# Patient Record
Sex: Female | Born: 1960 | Race: White | Hispanic: No | Marital: Married | Smoking: Former smoker
Health system: Southern US, Community
[De-identification: ages and names within clinical notes are randomized; demographics above are authoritative.]

## PROBLEM LIST (undated history)

## (undated) DIAGNOSIS — C801 Malignant (primary) neoplasm, unspecified: Secondary | ICD-10-CM

## (undated) DIAGNOSIS — I739 Peripheral vascular disease, unspecified: Secondary | ICD-10-CM

## (undated) DIAGNOSIS — M1990 Unspecified osteoarthritis, unspecified site: Secondary | ICD-10-CM

## (undated) DIAGNOSIS — K219 Gastro-esophageal reflux disease without esophagitis: Secondary | ICD-10-CM

## (undated) DIAGNOSIS — Z720 Tobacco use: Secondary | ICD-10-CM

## (undated) DIAGNOSIS — E785 Hyperlipidemia, unspecified: Secondary | ICD-10-CM

## (undated) DIAGNOSIS — E538 Deficiency of other specified B group vitamins: Secondary | ICD-10-CM

## (undated) DIAGNOSIS — F419 Anxiety disorder, unspecified: Secondary | ICD-10-CM

## (undated) DIAGNOSIS — J302 Other seasonal allergic rhinitis: Secondary | ICD-10-CM

## (undated) DIAGNOSIS — R0609 Other forms of dyspnea: Secondary | ICD-10-CM

## (undated) DIAGNOSIS — D7589 Other specified diseases of blood and blood-forming organs: Secondary | ICD-10-CM

## (undated) DIAGNOSIS — G8929 Other chronic pain: Secondary | ICD-10-CM

## (undated) DIAGNOSIS — M542 Cervicalgia: Secondary | ICD-10-CM

## (undated) DIAGNOSIS — R0600 Dyspnea, unspecified: Secondary | ICD-10-CM

## (undated) DIAGNOSIS — J449 Chronic obstructive pulmonary disease, unspecified: Secondary | ICD-10-CM

## (undated) DIAGNOSIS — I10 Essential (primary) hypertension: Secondary | ICD-10-CM

## (undated) DIAGNOSIS — I701 Atherosclerosis of renal artery: Secondary | ICD-10-CM

## (undated) DIAGNOSIS — N289 Disorder of kidney and ureter, unspecified: Secondary | ICD-10-CM

## (undated) HISTORY — DX: Tobacco use: Z72.0

## (undated) HISTORY — PX: APPENDECTOMY: SHX54

## (undated) HISTORY — DX: Atherosclerosis of renal artery: I70.1

## (undated) HISTORY — PX: ANTERIOR CERVICAL DECOMP/DISCECTOMY FUSION: SHX1161

## (undated) HISTORY — DX: Deficiency of other specified B group vitamins: E53.8

## (undated) HISTORY — DX: Other specified diseases of blood and blood-forming organs: D75.89

---

## 1988-07-19 HISTORY — PX: TUBAL LIGATION: SHX77

## 2006-04-13 ENCOUNTER — Ambulatory Visit: Payer: Self-pay | Admitting: Pediatrics

## 2007-03-03 ENCOUNTER — Ambulatory Visit (HOSPITAL_COMMUNITY): Admission: RE | Admit: 2007-03-03 | Discharge: 2007-03-03 | Payer: Self-pay | Admitting: Family Medicine

## 2007-03-24 ENCOUNTER — Ambulatory Visit (HOSPITAL_COMMUNITY): Admission: RE | Admit: 2007-03-24 | Discharge: 2007-03-24 | Payer: Self-pay | Admitting: Family Medicine

## 2007-05-03 ENCOUNTER — Ambulatory Visit: Payer: Self-pay | Admitting: Thoracic Surgery

## 2007-05-09 ENCOUNTER — Encounter: Payer: Self-pay | Admitting: Thoracic Surgery

## 2007-05-09 ENCOUNTER — Inpatient Hospital Stay (HOSPITAL_COMMUNITY): Admission: RE | Admit: 2007-05-09 | Discharge: 2007-05-11 | Payer: Self-pay | Admitting: Neurosurgery

## 2007-05-09 ENCOUNTER — Ambulatory Visit: Payer: Self-pay | Admitting: Thoracic Surgery

## 2010-09-30 ENCOUNTER — Other Ambulatory Visit (HOSPITAL_COMMUNITY): Payer: Self-pay | Admitting: Family Medicine

## 2010-09-30 DIAGNOSIS — Z139 Encounter for screening, unspecified: Secondary | ICD-10-CM

## 2010-10-05 ENCOUNTER — Ambulatory Visit (HOSPITAL_COMMUNITY)
Admission: RE | Admit: 2010-10-05 | Discharge: 2010-10-05 | Disposition: A | Discharge status: Home / Self Care | Payer: Managed Care, Other (non HMO) | Source: Ambulatory Visit | Attending: Family Medicine | Admitting: Family Medicine

## 2010-10-05 ENCOUNTER — Ambulatory Visit (HOSPITAL_COMMUNITY)
Admission: RE | Admit: 2010-10-05 | Discharge: 2010-10-05 | Disposition: A | Discharge status: Home / Self Care | Payer: Managed Care, Other (non HMO) | Source: Ambulatory Visit | Attending: Neurosurgery | Admitting: Neurosurgery

## 2010-10-05 DIAGNOSIS — M542 Cervicalgia: Secondary | ICD-10-CM | POA: Insufficient documentation

## 2010-10-05 DIAGNOSIS — Z139 Encounter for screening, unspecified: Secondary | ICD-10-CM

## 2010-10-05 DIAGNOSIS — Z1231 Encounter for screening mammogram for malignant neoplasm of breast: Secondary | ICD-10-CM | POA: Insufficient documentation

## 2010-10-05 DIAGNOSIS — IMO0001 Reserved for inherently not codable concepts without codable children: Secondary | ICD-10-CM | POA: Insufficient documentation

## 2010-10-05 DIAGNOSIS — M6281 Muscle weakness (generalized): Secondary | ICD-10-CM | POA: Insufficient documentation

## 2010-10-08 ENCOUNTER — Ambulatory Visit (HOSPITAL_COMMUNITY)
Admission: RE | Admit: 2010-10-08 | Discharge: 2010-10-08 | Disposition: A | Discharge status: Home / Self Care | Payer: Managed Care, Other (non HMO) | Source: Ambulatory Visit | Attending: *Deleted | Admitting: *Deleted

## 2010-10-13 ENCOUNTER — Ambulatory Visit (HOSPITAL_COMMUNITY)
Admission: RE | Admit: 2010-10-13 | Discharge: 2010-10-13 | Disposition: A | Discharge status: Home / Self Care | Payer: Managed Care, Other (non HMO) | Source: Ambulatory Visit | Attending: *Deleted | Admitting: *Deleted

## 2010-10-15 ENCOUNTER — Ambulatory Visit (HOSPITAL_COMMUNITY)
Admission: RE | Admit: 2010-10-15 | Discharge: 2010-10-15 | Disposition: A | Discharge status: Home / Self Care | Payer: Managed Care, Other (non HMO) | Source: Ambulatory Visit | Attending: *Deleted | Admitting: *Deleted

## 2010-10-20 ENCOUNTER — Ambulatory Visit (HOSPITAL_COMMUNITY): Payer: Managed Care, Other (non HMO)

## 2010-10-22 ENCOUNTER — Ambulatory Visit (HOSPITAL_COMMUNITY): Payer: Managed Care, Other (non HMO) | Admitting: *Deleted

## 2010-10-27 ENCOUNTER — Ambulatory Visit (HOSPITAL_COMMUNITY): Payer: Managed Care, Other (non HMO) | Admitting: *Deleted

## 2010-10-29 ENCOUNTER — Ambulatory Visit (HOSPITAL_COMMUNITY): Payer: Managed Care, Other (non HMO) | Admitting: Physical Therapy

## 2010-12-01 NOTE — Letter (Signed)
May 03, 2007   Connie Farrell. Venetia Maxon. MD  [address], [address]  [place], [place]  [address]   Re:  ZSAZSA, BAHENA                 DOB:  01-27-1961   Dr. Scherry Ran,   I saw the patient regarding her surgery on October 21.  This 50 year old  Caucasian female has had a long history of arm and back pain and  underwent an MRI of her neck and thoracic spine, which showed multi  level cervical and upper thoracic disk disease as well as osteophytes  and she is currently scheduled for a T1-T2 diskectomy on the 21st.  She  came here to discuss the surgery, as far as exposure.   PAST MEDICAL HISTORY:  She is on hydrocodone for pain and takes  tizanidine/hydrochlorothiazide 2 mg 4 times a day and has no allergies.   FAMILY HISTORY:  Noncontributory.   SOCIAL HISTORY:  She is married and has 2 children.  She works as a  Hydrographic surveyor.  She smokes a pack of cigarette per day, does not  drink alcohol on a regular basis.   REVIEW OF SYSTEMS:  She has had some weight loss, she is 142 pounds; she  is 5 feet 4 inches.  CARDIAC: No angina of atrial fibrillation.  PULMONARY:  She has asthma and wheezing.  GI:  No hiatal hernia, GERD, nausea, vomiting or constipation.  GU:  No kidney disease, dysuria or frequent urination.  VASCULAR:  No claudication, DVT, TIAs.  NEUROLOGICAL:  See history and present illness.  No seizures or  blackouts.  MUSCULOSKELETAL:  She has arthritis and joint pain.   No psychiatric illnesses, no changes in eyesight or hearing and no  problems with bleeding or clotting disorders.   PHYSICAL EXAMINATION:  She is a thin Caucasian female, in no acute  distress. Her blood pressure is 132/90, pulse 85, respirations 18,  saturation is 98%.  Head, Eyes, Ears, Nose And Throat:  Unremarkable.  Neck:  Supple without thyromegaly.  Chest:  Clear to auscultation and  percussion.  Heart:  Regular sinus rhythm, no murmurs.  Abdomen:  Soft.  There is no hepatosplenomegaly.  Pulses  are 2+.  No clubbing or edema.  Neurologic was intact.   I discussed the risk of the procedure, including possible laryngeal  nerve injury, bleeding and infection.  She agrees to the surgery and I  plan to do this next week.  I appreciate the opportunity in seeing the  patient.   Ines Bloomer, M.D.  Electronically Signed   DPB/MEDQ  D:  05/03/2007  T:  05/04/2007  Job:  [id number]

## 2010-12-01 NOTE — Op Note (Signed)
NAMEFERRIS, TALLY                 ACCOUNT NO.:  [id number]   MEDICAL RECORD NO.:  [id number]          PATIENT TYPE:  INP   LOCATION:  3316                         FACILITY:  [facility]   PHYSICIAN:  Danae Orleans. Venetia Maxon, M.D.  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   PROCEDURE:  1. Exposure to T2 performed by Dr. Edwyna Shell,.  2. Anterior cervical/thoracic discectomy and fusion C7-T1 and T1-T2      levels with allograft bone graft, submental autograft, and anterior      cervical plate C7 through T2 levels.   SURGEON:  Danae Orleans. Venetia Maxon, M.D.   ASSISTANT:  Dalia Heading, M.D.  Georgiann Cocker, RN   ANESTHESIA:  General endotracheal anesthesia.   BLOOD LOSS:  Minimal.   COMPLICATIONS:  None.   DISPOSITION:  Recovery.   INDICATIONS:  Connie Farrell is a 50 year old woman with severe right arm  pain and weakness with a right sided disc herniation at C7-T1 and  bilobed disc herniation with cord compression and bilateral T1 nerve  root compression at T1-T2.  It was elected to perform anterior  cervical/thoracic decompression and fusion at these affected levels.  The patient was counseled that she would possibly need a sternotomy to  expose this area.  She was also counseled that she may have hoarseness  after the surgery.   PROCEDURE IN DETAIL:  [name] was brought to the operating room.  She  was placed in a supine position in slight extension. After intubation  with general endotracheal anesthesia, her anterior neck and upper chest  were then prepped and draped  in the usual sterile fashion.  She had been  placed in 10 pounds of halter traction.  Dr. Edwyna Shell performed the  exposure and will dictate this separately.  After he had exposed the  anterior cervical spine, I placed a bent spinal needle at what was felt  to be the C7-T1 and C6-C7 levels and these were confirmed on  intraoperative x-ray.  Subsequently, further exposure was performed of  the anterior cervical thoracic spine.  The longus colli muscles were  taken down and the interspaces were then incised. Self-retaining  Shadowline retractor was placed to facilitate exposure.  The recurrent  laryngeal nerve had been identified and was carefully avoided throughout  the surgical procedure.   The discs were removed at C7-T1 and T1-T2 levels initially with a 15  blade and then a variety of Carlen curettes and pituitary rongeurs.  The  distraction pins were initially placed at T1 and T2 and the microscope  was brought into the field. Using high power microscopic visualization,  initially the endplates were decorticated with a high speed drill and  uncinate spurs were drilled down.  The spinal cord dura and both C1 nerve  roots were then decompressed as they extended out the neural foramina  and the posterior longitudinal ligament was removed.  Hemostasis was  assured and after trial sizing, a 7 mm allograft of bone wedge was  selected, deep ridges were thinned, and morcellized bone autograft,  which had been preserved at the time of drilling the endplates, was  mixed with demineralized bone matrix, packed within the spacer, inserted  in the interspace, and countersunk appropriately.  The distraction pin  was then moved from T2 to the C7 level and a similar discectomy was  performed. At this level, there was a significant amount of herniated  disc material and off on the right side which was compressing the right  C8 nerve root.  This was widely decompressed as was the spinal cord dura  and left  C8 nerve root.  Hemostasis was again assured and a similarly  sized bone graft was selected, fashioned, packed with morcellized bone  autograft and demineralized bone matrix, inserted in the interspace, and  countersunk appropriately.   The anterior cervical plate was then affixed to the anterior cervical  spine using tressel plating system.  14 mm variable angle screws were  placed, two at C7, two at T1, and two at T2. All screws had excellent  purchase.  Their locking mechanisms were engaged.  Final x-ray  demonstrated well positioned upper aspect of the plate at C7, it was not  possible to visualize the remainder of the construct because of the  level at which we were operating.  A small Blake drain was then placed  through a separate stab incision.  The soft tissues were inspected and  found to be in good repair.  Hemostasis assured.  The strap muscles were  reapproximated with 2-0 Vicryl sutures, the platysma layer was  reapproximated with 2-0 Vicryl interrupted inverted sutures, the skin  edges were approximated with interrupted 3-0 Vicryl subcuticular stitch.  The wound was dressed with Benzoin, Steri-Strips, Telfa gauze and tape.  The patient was extubated in the operating room and taken to the  recovery room in stable, satisfactory condition, having tolerated the  operation well.  Counts were correct at the end of the case.      Danae Orleans. Venetia Maxon, M.D.  Electronically Signed     JDS/MEDQ  D:  05/09/2007  T:  05/09/2007  Job:  [id number]

## 2011-04-28 LAB — BLOOD GAS, ARTERIAL
Acid-base deficit: 4.7 — ABNORMAL HIGH
Bicarbonate: 19.2 — ABNORMAL LOW
Patient temperature: 98.6
TCO2: 20.1

## 2011-04-28 LAB — BASIC METABOLIC PANEL
Chloride: 109
Creatinine, Ser: 0.77
GFR calc Af Amer: >60
Glucose, Bld: 103 — ABNORMAL HIGH
Potassium: 4.2
Sodium: 136

## 2011-04-28 LAB — URINALYSIS, ROUTINE W REFLEX MICROSCOPIC
Bilirubin Urine: NEGATIVE
Urobilinogen, UA: 1

## 2011-04-28 LAB — TYPE AND SCREEN: ABO/RH(D): A POS

## 2011-04-28 LAB — ABO/RH: ABO/RH(D): A POS

## 2011-04-28 LAB — CBC
HCT: 44.5
MCHC: 34
MCV: 95
Platelets: 266
RDW: 14

## 2011-04-28 LAB — PROTIME-INR
INR: 1
Prothrombin Time: 13.3

## 2011-04-28 LAB — URINE MICROSCOPIC-ADD ON

## 2011-04-28 LAB — APTT: aPTT: 30

## 2011-05-28 ENCOUNTER — Emergency Department (HOSPITAL_COMMUNITY): Payer: No Typology Code available for payment source

## 2011-05-28 ENCOUNTER — Encounter: Payer: Self-pay | Admitting: Emergency Medicine

## 2011-05-28 ENCOUNTER — Emergency Department (HOSPITAL_COMMUNITY)
Admission: EM | Admit: 2011-05-28 | Discharge: 2011-05-28 | Disposition: A | Discharge status: Home / Self Care | Payer: No Typology Code available for payment source | Attending: Emergency Medicine | Admitting: Emergency Medicine

## 2011-05-28 ENCOUNTER — Emergency Department (HOSPITAL_COMMUNITY): Payer: Managed Care, Other (non HMO)

## 2011-05-28 DIAGNOSIS — Y92410 Unspecified street and highway as the place of occurrence of the external cause: Secondary | ICD-10-CM | POA: Insufficient documentation

## 2011-05-28 DIAGNOSIS — E785 Hyperlipidemia, unspecified: Secondary | ICD-10-CM | POA: Insufficient documentation

## 2011-05-28 DIAGNOSIS — S060X9A Concussion with loss of consciousness of unspecified duration, initial encounter: Secondary | ICD-10-CM | POA: Insufficient documentation

## 2011-05-28 DIAGNOSIS — S060XAA Concussion with loss of consciousness status unknown, initial encounter: Secondary | ICD-10-CM | POA: Insufficient documentation

## 2011-05-28 DIAGNOSIS — S8253XA Displaced fracture of medial malleolus of unspecified tibia, initial encounter for closed fracture: Secondary | ICD-10-CM | POA: Insufficient documentation

## 2011-05-28 DIAGNOSIS — S139XXA Sprain of joints and ligaments of unspecified parts of neck, initial encounter: Secondary | ICD-10-CM | POA: Insufficient documentation

## 2011-05-28 DIAGNOSIS — S20219A Contusion of unspecified front wall of thorax, initial encounter: Secondary | ICD-10-CM | POA: Insufficient documentation

## 2011-05-28 DIAGNOSIS — M542 Cervicalgia: Secondary | ICD-10-CM | POA: Insufficient documentation

## 2011-05-28 DIAGNOSIS — G8929 Other chronic pain: Secondary | ICD-10-CM | POA: Insufficient documentation

## 2011-05-28 DIAGNOSIS — S82892A Other fracture of left lower leg, initial encounter for closed fracture: Secondary | ICD-10-CM

## 2011-05-28 DIAGNOSIS — S161XXA Strain of muscle, fascia and tendon at neck level, initial encounter: Secondary | ICD-10-CM

## 2011-05-28 DIAGNOSIS — I10 Essential (primary) hypertension: Secondary | ICD-10-CM | POA: Insufficient documentation

## 2011-05-28 HISTORY — DX: Hyperlipidemia, unspecified: E78.5

## 2011-05-28 HISTORY — DX: Cervicalgia: M54.2

## 2011-05-28 HISTORY — DX: Other seasonal allergic rhinitis: J30.2

## 2011-05-28 HISTORY — DX: Other chronic pain: G89.29

## 2011-05-28 HISTORY — DX: Essential (primary) hypertension: I10

## 2011-05-28 MED ORDER — METOCLOPRAMIDE HCL 5 MG/ML IJ SOLN
10.0000 mg | Freq: Once | INTRAMUSCULAR | Status: AC
  Administered 2011-05-28: 10 mg via INTRAMUSCULAR
  Filled 2011-05-28: qty 2

## 2011-05-28 MED ORDER — HYDROMORPHONE HCL PF 2 MG/ML IJ SOLN
2.0000 mg | Freq: Once | INTRAMUSCULAR | Status: AC
  Administered 2011-05-28: 2 mg via INTRAMUSCULAR
  Filled 2011-05-28: qty 1

## 2011-05-28 MED ORDER — METOCLOPRAMIDE HCL 10 MG PO TABS: 10.0000 mg | ORAL_TABLET | Freq: Four times a day (QID) | ORAL | Status: AC | PRN

## 2011-05-28 MED ORDER — OXYCODONE-ACETAMINOPHEN 5-325 MG PO TABS: 2.0000 | ORAL_TABLET | ORAL | Status: AC | PRN

## 2011-05-28 MED ORDER — OXYCODONE-ACETAMINOPHEN 5-325 MG PO TABS: 2.0000 | ORAL_TABLET | Freq: Once | ORAL | Status: DC
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
## 2011-05-28 NOTE — ED Notes (Signed)
Patient and family made aware of CT results, verbalized understanding. C-collar removed and gingerale given per request and EDP's approval.
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
## 2011-06-07 ENCOUNTER — Encounter: Payer: Self-pay | Admitting: Orthopedic Surgery

## 2011-06-07 ENCOUNTER — Ambulatory Visit (INDEPENDENT_AMBULATORY_CARE_PROVIDER_SITE_OTHER): Payer: Managed Care, Other (non HMO) | Admitting: Orthopedic Surgery

## 2011-06-07 VITALS — Ht 60.0 in | Wt 147.0 lb

## 2011-06-07 DIAGNOSIS — S82899A Other fracture of unspecified lower leg, initial encounter for closed fracture: Secondary | ICD-10-CM

## 2011-06-07 MED ORDER — OXYCODONE-ACETAMINOPHEN 7.5-325 MG PO TABS: 1.0000 | ORAL_TABLET | ORAL | Status: DC | PRN

## 2011-06-07 NOTE — Patient Instructions (Addendum)
You are allowed to weight bear as tolerated   She'll be out of work for a period of probably 6 weeks

## 2011-06-08 ENCOUNTER — Encounter: Payer: Self-pay | Admitting: Orthopedic Surgery
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
## 2011-06-15 ENCOUNTER — Telehealth: Payer: Self-pay | Admitting: Orthopedic Surgery

## 2011-06-15 NOTE — Telephone Encounter (Signed)
Advised to cancel script from Dr Romeo Apple per Dr Romeo Apple

## 2011-06-15 NOTE — Telephone Encounter (Signed)
Please call Marchelle Folks at [facility] about a prescription written for [facility]. Her # [phone]

## 2011-06-29 ENCOUNTER — Encounter: Payer: Self-pay | Admitting: Orthopedic Surgery

## 2011-06-29 ENCOUNTER — Ambulatory Visit (INDEPENDENT_AMBULATORY_CARE_PROVIDER_SITE_OTHER): Payer: Managed Care, Other (non HMO) | Admitting: Orthopedic Surgery

## 2011-06-29 VITALS — BP 124/80 | Ht 60.0 in | Wt 147.0 lb

## 2011-06-29 DIAGNOSIS — S82899A Other fracture of unspecified lower leg, initial encounter for closed fracture: Secondary | ICD-10-CM

## 2011-06-29 MED ORDER — OXYCODONE-ACETAMINOPHEN 5-325 MG PO TABS: 1.0000 | ORAL_TABLET | Freq: Four times a day (QID) | ORAL | Status: AC | PRN

## 2011-06-29 NOTE — Patient Instructions (Signed)
Brace weight bear as tolerated  
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
## 2011-08-03 ENCOUNTER — Encounter: Payer: Self-pay | Admitting: Orthopedic Surgery

## 2011-08-03 ENCOUNTER — Ambulatory Visit (INDEPENDENT_AMBULATORY_CARE_PROVIDER_SITE_OTHER): Payer: Managed Care, Other (non HMO) | Admitting: Orthopedic Surgery

## 2011-08-03 VITALS — Ht 60.0 in | Wt 147.0 lb

## 2011-08-03 DIAGNOSIS — S82899A Other fracture of unspecified lower leg, initial encounter for closed fracture: Secondary | ICD-10-CM

## 2011-08-03 MED ORDER — OXYCODONE-ACETAMINOPHEN 7.5-325 MG PO TABS: 1.0000 | ORAL_TABLET | ORAL | Status: DC | PRN

## 2011-08-03 NOTE — Patient Instructions (Signed)
Remove cam walker   Gradual return to normal activity

## 2011-08-11 ENCOUNTER — Encounter: Payer: Self-pay | Admitting: Orthopedic Surgery
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
## 2011-11-29 ENCOUNTER — Other Ambulatory Visit (HOSPITAL_COMMUNITY): Payer: Self-pay | Admitting: Internal Medicine

## 2011-11-29 DIAGNOSIS — M25569 Pain in unspecified knee: Secondary | ICD-10-CM

## 2011-11-29 DIAGNOSIS — R7989 Other specified abnormal findings of blood chemistry: Secondary | ICD-10-CM

## 2011-12-02 ENCOUNTER — Encounter (HOSPITAL_COMMUNITY): Payer: Self-pay

## 2011-12-02 ENCOUNTER — Ambulatory Visit (HOSPITAL_COMMUNITY)
Admission: RE | Admit: 2011-12-02 | Discharge: 2011-12-02 | Disposition: A | Discharge status: Home / Self Care | Payer: Managed Care, Other (non HMO) | Source: Ambulatory Visit | Attending: Internal Medicine | Admitting: Internal Medicine

## 2011-12-02 DIAGNOSIS — S2220XA Unspecified fracture of sternum, initial encounter for closed fracture: Secondary | ICD-10-CM | POA: Insufficient documentation

## 2011-12-02 DIAGNOSIS — R791 Abnormal coagulation profile: Secondary | ICD-10-CM | POA: Insufficient documentation

## 2011-12-02 DIAGNOSIS — R0602 Shortness of breath: Secondary | ICD-10-CM | POA: Insufficient documentation

## 2011-12-02 DIAGNOSIS — R7989 Other specified abnormal findings of blood chemistry: Secondary | ICD-10-CM

## 2011-12-02 DIAGNOSIS — R42 Dizziness and giddiness: Secondary | ICD-10-CM | POA: Insufficient documentation

## 2011-12-02 DIAGNOSIS — M25569 Pain in unspecified knee: Secondary | ICD-10-CM

## 2011-12-02 DIAGNOSIS — X58XXXA Exposure to other specified factors, initial encounter: Secondary | ICD-10-CM | POA: Insufficient documentation

## 2011-12-02 MED ORDER — IOHEXOL 350 MG/ML SOLN
100.0000 mL | Freq: Once | INTRAVENOUS | Status: AC | PRN
  Administered 2011-12-02: 100 mL via INTRAVENOUS

## 2012-02-21 ENCOUNTER — Other Ambulatory Visit (HOSPITAL_COMMUNITY): Payer: Self-pay | Admitting: Internal Medicine

## 2012-02-21 DIAGNOSIS — I739 Peripheral vascular disease, unspecified: Secondary | ICD-10-CM

## 2012-02-24 ENCOUNTER — Ambulatory Visit (HOSPITAL_COMMUNITY)
Admission: RE | Admit: 2012-02-24 | Discharge: 2012-02-24 | Disposition: A | Discharge status: Home / Self Care | Payer: Managed Care, Other (non HMO) | Source: Ambulatory Visit | Attending: Internal Medicine | Admitting: Internal Medicine

## 2012-02-24 DIAGNOSIS — I10 Essential (primary) hypertension: Secondary | ICD-10-CM | POA: Insufficient documentation

## 2012-02-24 DIAGNOSIS — I739 Peripheral vascular disease, unspecified: Secondary | ICD-10-CM

## 2012-02-24 DIAGNOSIS — F17200 Nicotine dependence, unspecified, uncomplicated: Secondary | ICD-10-CM | POA: Insufficient documentation

## 2012-03-22 ENCOUNTER — Encounter (HOSPITAL_COMMUNITY): Payer: Self-pay | Admitting: Pharmacy Technician

## 2012-04-03 ENCOUNTER — Other Ambulatory Visit: Payer: Self-pay | Admitting: Cardiovascular Disease

## 2012-04-04 ENCOUNTER — Encounter (HOSPITAL_COMMUNITY): Payer: Self-pay | Admitting: General Practice

## 2012-04-04 ENCOUNTER — Ambulatory Visit (HOSPITAL_COMMUNITY)
Admission: RE | Admit: 2012-04-04 | Discharge: 2012-04-05 | Disposition: A | Discharge status: Home / Self Care | Payer: Managed Care, Other (non HMO) | Source: Ambulatory Visit | Attending: Cardiovascular Disease | Admitting: Cardiovascular Disease

## 2012-04-04 ENCOUNTER — Encounter (HOSPITAL_COMMUNITY)
Admission: RE | Disposition: A | Discharge status: Home / Self Care | Payer: Self-pay | Source: Ambulatory Visit | Attending: Cardiovascular Disease

## 2012-04-04 DIAGNOSIS — I70219 Atherosclerosis of native arteries of extremities with intermittent claudication, unspecified extremity: Secondary | ICD-10-CM | POA: Insufficient documentation

## 2012-04-04 DIAGNOSIS — I10 Essential (primary) hypertension: Secondary | ICD-10-CM

## 2012-04-04 DIAGNOSIS — Z720 Tobacco use: Secondary | ICD-10-CM | POA: Diagnosis present

## 2012-04-04 DIAGNOSIS — I739 Peripheral vascular disease, unspecified: Secondary | ICD-10-CM

## 2012-04-04 DIAGNOSIS — E785 Hyperlipidemia, unspecified: Secondary | ICD-10-CM | POA: Diagnosis present

## 2012-04-04 HISTORY — PX: LOWER EXTREMITY ANGIOGRAM: SHX5508

## 2012-04-04 HISTORY — DX: Unspecified osteoarthritis, unspecified site: M19.90

## 2012-04-04 HISTORY — DX: Dyspnea, unspecified: R06.00

## 2012-04-04 HISTORY — PX: PERIPHERAL ARTERIAL STENT GRAFT: SHX2220

## 2012-04-04 HISTORY — PX: ABDOMINAL ANGIOGRAM: SHX5499

## 2012-04-04 HISTORY — DX: Peripheral vascular disease, unspecified: I73.9

## 2012-04-04 HISTORY — DX: Anxiety disorder, unspecified: F41.9

## 2012-04-04 HISTORY — DX: Gastro-esophageal reflux disease without esophagitis: K21.9

## 2012-04-04 HISTORY — DX: Other forms of dyspnea: R06.09

## 2012-04-04 LAB — POCT ACTIVATED CLOTTING TIME
Activated Clotting Time: 244 seconds
Activated Clotting Time: 194 seconds

## 2012-04-04 SURGERY — ANGIOGRAM, LOWER EXTREMITY
Anesthesia: LOCAL

## 2012-04-04 MED ORDER — SODIUM CHLORIDE 0.9 % IJ SOLN: 3.0000 mL | Freq: Two times a day (BID) | INTRAMUSCULAR | Status: DC

## 2012-04-04 MED ORDER — SODIUM CHLORIDE 0.9 % IV SOLN
INTRAVENOUS | Status: AC
  Administered 2012-04-04: 13:00:00 via INTRAVENOUS

## 2012-04-04 MED ORDER — MORPHINE SULFATE 2 MG/ML IJ SOLN: 1.0000 mg | INTRAMUSCULAR | Status: DC | PRN

## 2012-04-04 MED ORDER — ASPIRIN EC 81 MG PO TBEC
81.0000 mg | DELAYED_RELEASE_TABLET | Freq: Every day | ORAL | Status: DC
  Administered 2012-04-05: 10:00:00 81 mg via ORAL
  Filled 2012-04-04 (×2): qty 1

## 2012-04-04 MED ORDER — HYDRALAZINE HCL 20 MG/ML IJ SOLN: 10.0000 mg | INTRAMUSCULAR | Status: DC

## 2012-04-04 MED ORDER — LISINOPRIL 20 MG PO TABS
20.0000 mg | ORAL_TABLET | Freq: Every day | ORAL | Status: DC
  Administered 2012-04-04 – 2012-04-05 (×2): 20 mg via ORAL
  Filled 2012-04-04 (×2): qty 1

## 2012-04-04 MED ORDER — CLOPIDOGREL BISULFATE 300 MG PO TABS
ORAL_TABLET | ORAL | Status: AC
  Filled 2012-04-04: qty 1

## 2012-04-04 MED ORDER — LIDOCAINE HCL (PF) 1 % IJ SOLN
INTRAMUSCULAR | Status: AC
  Filled 2012-04-04: qty 30

## 2012-04-04 MED ORDER — ATORVASTATIN CALCIUM 40 MG PO TABS
40.0000 mg | ORAL_TABLET | Freq: Every day | ORAL | Status: DC
  Administered 2012-04-04: 40 mg via ORAL
  Filled 2012-04-04 (×2): qty 1

## 2012-04-04 MED ORDER — HEPARIN SODIUM (PORCINE) 1000 UNIT/ML IJ SOLN
INTRAMUSCULAR | Status: AC
  Filled 2012-04-04: qty 1

## 2012-04-04 MED ORDER — ASPIRIN 81 MG PO CHEW
CHEWABLE_TABLET | ORAL | Status: AC
  Administered 2012-04-04: 324 mg via ORAL
  Filled 2012-04-04: qty 4

## 2012-04-04 MED ORDER — SODIUM CHLORIDE 0.9 % IV SOLN: 250.0000 mL | INTRAVENOUS | Status: DC | PRN

## 2012-04-04 MED ORDER — ONDANSETRON HCL 4 MG/2ML IJ SOLN: 4.0000 mg | Freq: Four times a day (QID) | INTRAMUSCULAR | Status: DC | PRN

## 2012-04-04 MED ORDER — HYDROCODONE-ACETAMINOPHEN 5-325 MG PO TABS
1.0000 | ORAL_TABLET | ORAL | Status: DC | PRN
  Administered 2012-04-04: 1 via ORAL
  Filled 2012-04-04: qty 1

## 2012-04-04 MED ORDER — CYCLOBENZAPRINE HCL 10 MG PO TABS: 5.0000 mg | ORAL_TABLET | Freq: Two times a day (BID) | ORAL | Status: DC | PRN

## 2012-04-04 MED ORDER — HEPARIN (PORCINE) IN NACL 2-0.9 UNIT/ML-% IJ SOLN
INTRAMUSCULAR | Status: AC
  Filled 2012-04-04: qty 500

## 2012-04-04 MED ORDER — PANTOPRAZOLE SODIUM 40 MG PO TBEC
40.0000 mg | DELAYED_RELEASE_TABLET | Freq: Every day | ORAL | Status: DC
  Administered 2012-04-04 – 2012-04-05 (×2): 40 mg via ORAL
  Filled 2012-04-04 (×2): qty 1

## 2012-04-04 MED ORDER — SODIUM CHLORIDE 0.9 % IV SOLN
INTRAVENOUS | Status: DC
  Administered 2012-04-04: 08:00:00 via INTRAVENOUS

## 2012-04-04 MED ORDER — ACETAMINOPHEN 325 MG PO TABS: 650.0000 mg | ORAL_TABLET | ORAL | Status: DC | PRN

## 2012-04-04 MED ORDER — LISINOPRIL-HYDROCHLOROTHIAZIDE 20-12.5 MG PO TABS: 1.0000 | ORAL_TABLET | Freq: Every day | ORAL | Status: DC

## 2012-04-04 MED ORDER — ASPIRIN 81 MG PO CHEW
324.0000 mg | CHEWABLE_TABLET | ORAL | Status: AC
  Administered 2012-04-04: 324 mg via ORAL

## 2012-04-04 MED ORDER — SODIUM CHLORIDE 0.9 % IJ SOLN: 3.0000 mL | INTRAMUSCULAR | Status: DC | PRN

## 2012-04-04 MED ORDER — CLOPIDOGREL BISULFATE 75 MG PO TABS
75.0000 mg | ORAL_TABLET | Freq: Every day | ORAL | Status: DC
  Administered 2012-04-05: 10:00:00 75 mg via ORAL
  Filled 2012-04-04: qty 1

## 2012-04-04 MED ORDER — TRAMADOL HCL 50 MG PO TABS: 50.0000 mg | ORAL_TABLET | Freq: Four times a day (QID) | ORAL | Status: DC | PRN

## 2012-04-04 MED ORDER — ESCITALOPRAM OXALATE 10 MG PO TABS
10.0000 mg | ORAL_TABLET | Freq: Every day | ORAL | Status: DC
  Administered 2012-04-04 – 2012-04-05 (×2): 10 mg via ORAL
  Filled 2012-04-04 (×2): qty 1

## 2012-04-04 MED ORDER — HYDROCHLOROTHIAZIDE 12.5 MG PO CAPS
12.5000 mg | ORAL_CAPSULE | Freq: Every day | ORAL | Status: DC
  Administered 2012-04-04 – 2012-04-05 (×2): 12.5 mg via ORAL
  Filled 2012-04-04 (×2): qty 1

## 2012-04-04 MED ORDER — NITROGLYCERIN 0.2 MG/ML ON CALL CATH LAB
INTRAVENOUS | Status: AC
  Filled 2012-04-04: qty 1

## 2012-04-04 NOTE — H&P (Signed)
  H & P will be scanned in.  Pt was reexamined and existing H & P reviewed. No changes found.  Runell Gess, MD [facility] 04/04/2012 10:15 AM

## 2012-04-04 NOTE — Progress Notes (Signed)
Site area: left groin  Site Prior to Removal:  Level 0  Pressure Applied For 20 MINUTES    Minutes Beginning at 1455  Manual:   yes  Patient Status During Pull:  [id number]  Post Pull Groin Site:  Level 0  Post Pull Instructions Given:  yes  Post Pull Pulses Present:  yes  Dressing Applied:  yes  Comment: Tolerated procedure well. Sheath pulled by Virgie Dad RN

## 2012-04-04 NOTE — Plan of Care (Signed)
Problem: Phase I Progression Outcomes Goal: Initial discharge plan identified Outcome: Completed/Met Date Met:  04/04/12 Home with husband.

## 2012-04-04 NOTE — Op Note (Signed)
Connie Farrell is a 51 y.o. female    [id number] LOCATION:  FACILITY: [facility]  PHYSICIAN: Nanetta Batty, M.D. 01/07/1961   DATE OF PROCEDURE:  04/04/2012  DATE OF DISCHARGE:  [facility]  PV Intervention    History obtained from chart review. Connie Farrell is a 51 year old thin appearing married Caucasian female mother of 2 referred to me by Dr. Artis Delay for peripheral evaluation. Her cardiovascular risk factor profile is positive for a 30-pack-year history of tobacco abuse currently smoking one pack per day, treated hypertension and hyperlipidemia.there is a strong family history for disease. The patient had a negative Myoview recently at an outside facility. Dopplers performed at an outside hospital revealed a right ABI of 1.03 a left ABI of 0.91 with what is described as popliteal and infrapopliteal disease on the left.Dopplers in my lab revealed a right ABI of 0.98, left ABI of 0.8 to with what appeared to be a high-frequency signal at the origin of the left common iliac artery. Patient presents now for angiography and potential percutaneous intervention for lifestyle limiting claudication  PROCEDURE DESCRIPTION:    The patient was brought to the second floor  [place] [facility] lab in the postabsorptive state. She was not  premedicated  Her right and left groins Were prepped and shaved in usual sterile fashion. Xylocaine 1% was used  for local anesthesia. A 5 French sheath was inserted into the right common femoral artery using standard Seldinger technique. The patient received  4000 units  of heparin  intravenously.  Total contrast used during the case was 192 cc. The ACT was measured at 244.    HEMODYNAMICS:    AO SYSTOLIC/AO DIASTOLIC: 161/91    ANGIOGRAPHIC RESULTS:   1: Abdominal aortogram-80% left renal artery stenosis with normal infrarenal, aorta.  2: Left lower extremity-50-60% ostial left common iliac artery stenosis. The SFA and  infrapopliteal arteries were normal with three-vessel runoff.  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 My plan is to access the left common femoral artery and perform pullback across the iliac stenosis using a 5 French endhole catheter and intra-arterial nitroglycerin provocation.  Procedure description:  A 5 French sheath was inserted into the left common femoral artery using standard Seldinger technique. A 035 Versicore  wire was then advanced across the lesion and a 5 French endhole catheter was then placed in the distal dominant order. 200 mcg of intra-arterial occlusion was administered to the sacrum she no pullback gradient noted of approximately 40 mm of mercury. Following this the 5 French sheath was exchanged for a 6 [place] Bright tip sheath and 4000 units of heparin was administered. A 7 mm x 18 mm long Cordis balloon expandable stent was then carefully positioned at the origin of the left common iliac artery and deployed at 6 atmospheres resulting in reduction of a 60% stenosis to 0% residual. The patient tolerated the procedure well. The guidewire was removed as was the pigtail catheter. The sheath was secured in place and plans will be to  remove the sheaths was used he falls below 170. Patient will be hydrated overnight. She received Plavix 300 mg by mouth.  Overall impression: Connie Farrell had successful PTA and stenting of the origin of the left common iliac artery which was demonstrated on duplex ultrasound and physiologically with a pullback gradient after an  intra-articular glycerin. There was no disease below the inguinal ligament. She does have a moderate lesion in her right external iliac artery which is not apparent at this time but may require further intervention in the future. Patient will be to aspirin products,  hydrated overnight and discharged on the morning. The followup Dopplers and will see me back  Runell Gess. MD, [facility] 04/04/2012 11:14 AM

## 2012-04-04 NOTE — Progress Notes (Signed)
Site area: right groin  Site Prior to Removal:  Level 0  Pressure Applied For 30 MINUTES    Minutes Beginning at 14202  Manual:   yes  Patient Status During Pull:  AAO X3  Post Pull Groin Site:  Level 0  Post Pull Instructions Given:  yes  Post Pull Pulses Present:  yes  Dressing Applied:  yes  Comments:  Tolerated procedure well

## 2012-04-05 DIAGNOSIS — I739 Peripheral vascular disease, unspecified: Secondary | ICD-10-CM | POA: Diagnosis present

## 2012-04-05 DIAGNOSIS — Z720 Tobacco use: Secondary | ICD-10-CM | POA: Diagnosis present

## 2012-04-05 DIAGNOSIS — E785 Hyperlipidemia, unspecified: Secondary | ICD-10-CM | POA: Diagnosis present

## 2012-04-05 DIAGNOSIS — I10 Essential (primary) hypertension: Secondary | ICD-10-CM | POA: Diagnosis present

## 2012-04-05 LAB — CBC
MCH: 32.4 pg (ref 26.0–34.0)
MCV: 94 fL (ref 78.0–100.0)
Platelets: 182 10*3/uL (ref 150–400)
RDW: 14 % (ref 11.5–15.5)
WBC: 5.9 10*3/uL (ref 4.0–10.5)

## 2012-04-05 LAB — BASIC METABOLIC PANEL
CO2: 23 mEq/L (ref 19–32)
Calcium: 9.8 mg/dL (ref 8.4–10.5)
Creatinine, Ser: 1.06 mg/dL (ref 0.50–1.10)

## 2012-04-05 MED ORDER — CLOPIDOGREL BISULFATE 75 MG PO TABS: 75.0000 mg | ORAL_TABLET | Freq: Every day | ORAL | Status: DC

## 2012-04-05 NOTE — Progress Notes (Signed)
[facility]  Subjective: No complaints.  Objective: Vital signs in last 24 hours: Temp:  [97.7 F (36.5 C)-98.3 F (36.8 C)] 98.3 F (36.8 C) (09/18 0827) Pulse Rate:  [63-86] 70  (09/18 0827) Resp:  [15-22] 15  (09/18 0827) BP: (114-139)/(69-84) 118/81 mmHg (09/18 0827) SpO2:  [94 %-100 %] 94 % (09/18 0827) Weight:  [60.2 kg (132 lb 11.5 oz)] 60.2 kg (132 lb 11.5 oz) (09/18 0014) Last BM Date: 04/05/12  Intake/Output from previous day: 09/17 0701 - 09/18 0700 In: 750 [P.O.:300; I.V.:450] Out: 1650 [Urine:1650] Intake/Output this shift:    Medications Current Facility-Administered Medications  Medication Dose Route Frequency Provider Last Rate Last Dose  . 0.9 %  sodium chloride infusion   Intravenous Continuous Runell Gess, MD 75 mL/hr at 04/04/12 1900    . acetaminophen (TYLENOL) tablet 650 mg  650 mg Oral Q4H PRN Runell Gess, MD      . aspirin EC tablet 81 mg  81 mg Oral Daily Runell Gess, MD   81 mg at 04/05/12 0956  . atorvastatin (LIPITOR) tablet 40 mg  40 mg Oral QPC supper Runell Gess, MD   40 mg at 04/04/12 1823  . clopidogrel (PLAVIX) 300 MG tablet           . clopidogrel (PLAVIX) tablet 75 mg  75 mg Oral Q breakfast Runell Gess, MD   75 mg at 04/05/12 0957  . cyclobenzaprine (FLEXERIL) tablet 5-10 mg  5-10 mg Oral BID PRN Runell Gess, MD      . escitalopram (LEXAPRO) tablet 10 mg  10 mg Oral Daily Runell Gess, MD   10 mg at 04/05/12 0957  . heparin 1000 UNIT/ML injection           . hydrALAZINE (APRESOLINE) injection 10 mg  10 mg Intravenous UD Runell Gess, MD      . hydrochlorothiazide (MICROZIDE) capsule 12.5 mg  12.5 mg Oral Daily Runell Gess, MD   12.5 mg at 04/05/12 0957  . HYDROcodone-acetaminophen (NORCO/VICODIN) 5-325 MG per tablet 1 tablet  1 tablet Oral Q4H PRN Runell Gess, MD   1 tablet at 04/04/12 1309  . lisinopril (PRINIVIL,ZESTRIL) tablet 20 mg  20 mg Oral Daily Runell Gess, MD   20 mg at 04/05/12 0957  . morphine 2 MG/ML injection 1 mg  1 mg Intravenous Q1H PRN Runell Gess, MD      . nitroGLYCERIN (NTG ON-CALL) 0.2 mg/mL injection           . ondansetron (ZOFRAN) injection 4 mg  4 mg Intravenous Q6H PRN Runell Gess, MD      . pantoprazole (PROTONIX) EC tablet 40 mg  40 mg Oral Q1200 Runell Gess, MD   40 mg at 04/04/12 1822  . traMADol (ULTRAM) tablet 50 mg  50 mg Oral Q6H PRN Runell Gess, MD      . DISCONTD: 0.9 %  sodium chloride infusion  250 mL Intravenous PRN Runell Gess, MD      . DISCONTD: 0.9 %  sodium chloride infusion   Intravenous Continuous Runell Gess, MD 75 mL/hr at 04/04/12 0730    . DISCONTD: lisinopril-hydrochlorothiazide (PRINZIDE,ZESTORETIC) 20-12.5 MG per tablet 1 tablet  1 tablet Oral Daily Runell Gess, MD      . DISCONTD: sodium chloride 0.9 % injection 3 mL  3 mL Intravenous Q12H Runell Gess, MD      .  DISCONTD: sodium chloride 0.9 % injection 3 mL  3 mL Intravenous PRN Runell Gess, MD        PE: General appearance: alert, cooperative and no distress Lungs: clear to auscultation bilaterally Heart: regular rate and rhythm, S1, S2 normal, no murmur, click, rub or gallop Extremities: No LEE Pulses: 2+ and symmetric Neurologic: Grossly normal Both groins look good. No hematoma.  Mild ecchymosis and tenderness in the left.  No bruits.    Lab Results:   [facility] 04/05/12 0415  WBC 5.9  HGB 12.5  HCT 36.3  PLT 182   BMET  Basename 04/05/12 0415  NA 139  K 3.8  CL 105  CO2 23  GLUCOSE 105*  BUN 13  CREATININE 1.06  CALCIUM 9.8    Studies/Results:  PROCEDURE DESCRIPTION:  The patient was brought to the second floor  [place] [facility] lab in the postabsorptive state. She was not  premedicated Her right and left groins  Were prepped and shaved in usual sterile fashion. Xylocaine 1% was used  for local anesthesia. A 5 French sheath was inserted into the right common  femoral artery using standard Seldinger technique. The patient received  4000 units of heparin intravenously. Total contrast used during the case was 192 cc. The ACT was measured at 244.  HEMODYNAMICS:  AO SYSTOLIC/AO DIASTOLIC: 161/91  ANGIOGRAPHIC RESULTS:  1: Abdominal aortogram-80% left renal artery stenosis with normal infrarenal, aorta.  2: Left lower extremity-50-60% ostial left common iliac artery stenosis. The SFA and infrapopliteal arteries were normal with three-vessel runoff.  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 My plan is to access the left common femoral artery and perform pullback across the iliac stenosis using a 5 French endhole catheter and intra-arterial nitroglycerin provocation.  Procedure description:  A 5 French sheath was inserted into the left common femoral artery using standard Seldinger technique. A 035 Versicore wire was then advanced across the lesion and a 5 French endhole catheter was then placed in the distal dominant order. 200 mcg of intra-arterial occlusion was administered to the sacrum she no pullback gradient noted of approximately 40 mm of mercury. Following this the 5 French sheath was exchanged for a 6 [place] Bright tip sheath and 4000 units of heparin was administered. A 7 mm x 18 mm long Cordis balloon expandable stent was then carefully positioned at the origin of the left common iliac artery and deployed at 6 atmospheres resulting in reduction of a 60% stenosis to 0% residual. The patient tolerated the procedure well. The guidewire was removed as was the pigtail catheter. The sheath was secured in place and plans will be to remove the sheaths was used he falls below 170. Patient will be hydrated overnight. She received Plavix 300 mg by mouth.  Overall impression: Connie Farrell had successful  PTA and stenting of the origin of the left common iliac artery which was demonstrated on duplex ultrasound and physiologically with a pullback gradient after an intra-articular glycerin. There was no disease below the inguinal ligament. She does have a moderate lesion in her right external iliac artery which is not apparent at this time but may require further intervention in the future. Patient will be to aspirin products, hydrated overnight and discharged on the morning. The followup Dopplers and will see me back  Runell Gess MD, [facility]   Assessment/Plan  Principal Problem:  *PAD (peripheral artery disease) Active Problems:  HTN (hypertension)  Tobacco abuse  HLD (hyperlipidemia)  Plan:  Pt doing well.  S/P PTA and stent at the origin of the left common iliac artery.   ASA, lipitor, plavix, HCTZ, lisinopril.  OP LEA dopplers then FU with Dr. Allyson Sabal.  Vital signs stable.   LOS: 1 day    HAGER, BRYAN 04/05/2012 10:33 AM   Agree with note written by Jones Skene PAC  S/P LCIA PTA/Stent. Groin OK. Labs OK. D/C home on asa/Plavix. LEA the ROV with me.  Runell Gess 04/05/2012 12:09 PM

## 2012-04-05 NOTE — Discharge Summary (Signed)
Physician Discharge Summary  Patient ID: Connie Farrell MRN: [id number] DOB/AGE: Feb 07, 1961 51 y.o.  Admit date: 04/04/2012 Discharge date: 04/05/2012  Admission Diagnoses: Lifestyle limiting claudication  Discharge Diagnoses:  Principal Problem:  *PAD (peripheral artery disease) Active Problems:  HTN (hypertension)  Tobacco abuse  HLD (hyperlipidemia)  Claudication   Discharged Condition: stable  Hospital Course:   Connie Farrell is a 51 year old thin appearing married Caucasian female mother of 2 referred to me by Dr. Artis Delay for peripheral evaluation. Her cardiovascular risk factor profile is positive for a 30-pack-year history of tobacco abuse currently smoking one pack per day, treated hypertension and hyperlipidemia.there is a strong family history for disease. The patient had a negative Myoview recently at an outside facility. Dopplers performed at an outside hospital revealed a right ABI of 1.03 a left ABI of 0.91 with what is described as popliteal and infrapopliteal disease on the left.Dopplers in my lab revealed a right ABI of 0.98, left ABI of 0.8 to with what appeared to be a high-frequency signal at the origin of the left common iliac artery.   The patient presented for angiography and potential percutaneous intervention for lifestyle limiting claudication.  She ultimately had successful PTA and stenting of the origin of the left common iliac artery(see full report below).  She has been seen by Dr. Allyson Sabal and felt to be stable for DC home.  LEA dopplers and FU appt arranged.  Consults: None  Significant Diagnostic Studies:  PV angiogram and stenting  PROCEDURE DESCRIPTION:  The patient was brought to the second floor  [place] [facility] lab in the postabsorptive state. She was not  premedicated Her right and left groins  Were prepped and shaved in usual sterile fashion. Xylocaine 1% was used  for local anesthesia. A 5 French sheath was inserted into the right common  femoral artery using standard Seldinger technique. The patient received  4000 units of heparin intravenously. Total contrast used during the case was 192 cc. The ACT was measured at 244.  HEMODYNAMICS:  AO SYSTOLIC/AO DIASTOLIC: 161/91  ANGIOGRAPHIC RESULTS:  1: Abdominal aortogram-80% left renal artery stenosis with normal infrarenal, aorta.  2: Left lower extremity-50-60% ostial left common iliac artery stenosis. The SFA and infrapopliteal arteries were normal with three-vessel runoff.  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 My plan is to access the left common femoral artery and perform pullback across the iliac stenosis using a 5 French endhole catheter and intra-arterial nitroglycerin provocation.  Procedure description:  A 5 French sheath was inserted into the left common femoral artery using standard Seldinger technique. A 035 Versicore wire was then advanced across the lesion and a 5 French endhole catheter was then placed in the distal dominant order. 200 mcg of intra-arterial occlusion was administered to the sacrum she no pullback gradient noted of approximately 40 mm of mercury. Following this the 5 French sheath was exchanged for a 6 [place] Bright tip sheath and 4000 units of heparin was administered. A 7 mm x 18 mm long Cordis balloon expandable stent was then carefully positioned at the origin of the left common iliac artery and deployed at 6 atmospheres resulting in reduction of a 60% stenosis to 0% residual. The patient tolerated the procedure well. The guidewire was removed as was the pigtail catheter. The sheath was secured in place and plans will be to remove  the sheaths was used he falls below 170. Patient will be hydrated overnight. She received Plavix 300 mg by mouth.  Overall impression: Connie Farrell had successful  PTA and stenting of the origin of the left common iliac artery which was demonstrated on duplex ultrasound and physiologically with a pullback gradient after an intra-articular glycerin. There was no disease below the inguinal ligament. She does have a moderate lesion in her right external iliac artery which is not apparent at this time but may require further intervention in the future. Patient will be to aspirin products, hydrated overnight and discharged on the morning. The followup Dopplers and will see me back  Runell Gess MD, [facility]  BMET    Component Value Date/Time   NA 139 04/05/2012 0415   K 3.8 04/05/2012 0415   CL 105 04/05/2012 0415   CO2 23 04/05/2012 0415   GLUCOSE 105* 04/05/2012 0415   BUN 13 04/05/2012 0415   CREATININE 1.06 04/05/2012 0415   CALCIUM 9.8 04/05/2012 0415   GFRNONAA 61* 04/05/2012 0415   GFRAA 70* 04/05/2012 0415   CBC    Component Value Date/Time   WBC 5.9 04/05/2012 0415   RBC 3.86* 04/05/2012 0415   HGB 12.5 04/05/2012 0415   HCT 36.3 04/05/2012 0415   PLT 182 04/05/2012 0415   MCV 94.0 04/05/2012 0415   MCH 32.4 04/05/2012 0415   MCHC 34.4 04/05/2012 0415   RDW 14.0 04/05/2012 0415   Treatments: PTA and stenting of the origin of the left common iliac artery  Discharge Exam: Blood pressure 118/81, pulse 70, temperature 98.3 F (36.8 C), temperature source Oral, resp. rate 15, height 5\' 4"  (1.626 m), weight 60.2 kg (132 lb 11.5 oz), SpO2 94.00%.   Disposition: 01-Home or Self Care      Discharge Orders    Future Orders Please Complete By Expires   Diet - low sodium heart healthy      Increase activity slowly      Discharge instructions      Comments:   1.  Stop smoking. 2.  No lifting more than a gallon of milk or driving      for three days   Call MD for:  redness, tenderness, or signs of infection (pain, swelling, redness, odor or green/yellow discharge around incision site)          Medication List     As of 04/05/2012 11:31 AM    STOP  taking these medications         GOODY HEADACHE PO      TAKE these medications         aspirin EC 81 MG tablet   Take 81 mg by mouth daily.      atorvastatin 40 MG tablet   Commonly known as: LIPITOR   Take 40 mg by mouth daily.      clopidogrel 75 MG tablet   Commonly known as: PLAVIX   Take 1 tablet (75 mg total) by mouth daily with breakfast.      cyclobenzaprine 10 MG tablet   Commonly known as: FLEXERIL   Take 5-10 mg by mouth 2 (two) times daily as needed. For pain      escitalopram 10 MG tablet   Commonly known as: LEXAPRO   Take 10 mg by mouth Daily.      HYDROcodone-acetaminophen 5-500 MG per tablet   Commonly known as: VICODIN   Take 1 tablet by mouth Every 8 hours as needed. For pain  lisinopril-hydrochlorothiazide 20-12.5 MG per tablet   Commonly known as: PRINZIDE,ZESTORETIC   Take 1 tablet by mouth daily.      omeprazole 20 MG capsule   Commonly known as: PRILOSEC   Take 20 mg by mouth daily.      pentoxifylline 400 MG CR tablet   Commonly known as: TRENTAL   Take 400 mg by mouth 3 (three) times daily with meals.      traMADol 50 MG tablet   Commonly known as: ULTRAM   Take 50 mg by mouth every 6 (six) hours as needed. For pain        Follow-up Information    Follow up with Runell Gess, MD. (Our office will call with appt. dates and times.)    Contact information:   [address] [place] [address] [phone]          Signed: Wilburt Finlay 04/05/2012, 11:31 AM

## 2012-04-25 ENCOUNTER — Other Ambulatory Visit (HOSPITAL_COMMUNITY)
Admission: RE | Admit: 2012-04-25 | Discharge: 2012-04-25 | Disposition: A | Discharge status: Home / Self Care | Payer: Managed Care, Other (non HMO) | Source: Ambulatory Visit | Attending: Obstetrics and Gynecology | Admitting: Obstetrics and Gynecology

## 2012-04-25 ENCOUNTER — Other Ambulatory Visit: Payer: Self-pay | Admitting: Adult Health

## 2012-04-25 DIAGNOSIS — Z01419 Encounter for gynecological examination (general) (routine) without abnormal findings: Secondary | ICD-10-CM | POA: Insufficient documentation

## 2012-04-25 DIAGNOSIS — Z1151 Encounter for screening for human papillomavirus (HPV): Secondary | ICD-10-CM | POA: Insufficient documentation

## 2012-11-24 ENCOUNTER — Other Ambulatory Visit (HOSPITAL_COMMUNITY): Payer: Self-pay | Admitting: Cardiovascular Disease

## 2012-11-24 DIAGNOSIS — I739 Peripheral vascular disease, unspecified: Secondary | ICD-10-CM

## 2012-11-24 DIAGNOSIS — I10 Essential (primary) hypertension: Secondary | ICD-10-CM

## 2012-12-12 ENCOUNTER — Ambulatory Visit (HOSPITAL_COMMUNITY)
Admission: RE | Admit: 2012-12-12 | Discharge: 2012-12-12 | Disposition: A | Discharge status: Home / Self Care | Payer: Managed Care, Other (non HMO) | Source: Ambulatory Visit | Attending: Cardiovascular Disease | Admitting: Cardiovascular Disease

## 2012-12-12 DIAGNOSIS — I10 Essential (primary) hypertension: Secondary | ICD-10-CM

## 2012-12-12 DIAGNOSIS — I701 Atherosclerosis of renal artery: Secondary | ICD-10-CM

## 2012-12-12 DIAGNOSIS — I70219 Atherosclerosis of native arteries of extremities with intermittent claudication, unspecified extremity: Secondary | ICD-10-CM

## 2012-12-12 DIAGNOSIS — I739 Peripheral vascular disease, unspecified: Secondary | ICD-10-CM | POA: Insufficient documentation

## 2012-12-12 NOTE — Progress Notes (Signed)
Arterial Duplex Lower Ext. Completed. Jillene Wehrenberg, RDMS, RVT  

## 2012-12-13 ENCOUNTER — Telehealth: Payer: Self-pay | Admitting: *Deleted

## 2012-12-13 DIAGNOSIS — I739 Peripheral vascular disease, unspecified: Secondary | ICD-10-CM

## 2012-12-13 NOTE — Progress Notes (Signed)
Renal Duplex Completed. Argenis Kumari, RDMS, RVT  

## 2012-12-13 NOTE — Telephone Encounter (Signed)
Ordered f/u for renal and le dopplers

## 2012-12-13 NOTE — Telephone Encounter (Signed)
Message copied by Marella Bile on Wed Dec 13, 2012  7:21 PM ------      Message from: Runell Gess      Created: Wed Dec 13, 2012  1:42 PM       No change from prior study. Repeat in 12 months. ------

## 2013-03-09 ENCOUNTER — Other Ambulatory Visit: Payer: Self-pay | Admitting: *Deleted

## 2013-03-09 MED ORDER — CLOPIDOGREL BISULFATE 75 MG PO TABS: 75.0000 mg | ORAL_TABLET | Freq: Every day | ORAL | Status: DC

## 2013-03-09 NOTE — Telephone Encounter (Signed)
Rx was sent to pharmacy electronically. 

## 2013-04-02 ENCOUNTER — Telehealth: Payer: Self-pay | Admitting: Cardiovascular Disease

## 2013-04-02 MED ORDER — PANTOPRAZOLE SODIUM 40 MG PO TBEC: 40.0000 mg | DELAYED_RELEASE_TABLET | Freq: Two times a day (BID) | ORAL | Status: DC

## 2013-04-02 NOTE — Telephone Encounter (Signed)
Returned call to [name]) and left message to call back today before 4pm.  Call to pt's home/cell and no answer.  Will await return call.  Need to clarify current dose of omeprazole.

## 2013-04-02 NOTE — Telephone Encounter (Signed)
Pt need new prescription-said she was told when the old one expired Dr Allyson Sabal would call a new one in.This original prescription was from another doctor.She need a new prescription for Omeprazole 20 mg-Call to [phone].

## 2013-04-02 NOTE — Telephone Encounter (Signed)
Returned all to [facility] and spoke w/ pt.  Clarified pt is taking omeprazole 20 mg BID and using [employer].  Informed refill will be sent.  ?Interaction w/ clopidogrel and omeprazole.  Advised change to pantoprazole.  Belenda Cruise, PharmD notified and advised pantoprazole 40 mg BID.  Pt aware of change.

## 2013-05-11 ENCOUNTER — Telehealth (HOSPITAL_COMMUNITY): Payer: Self-pay | Admitting: *Deleted

## 2013-06-07 ENCOUNTER — Other Ambulatory Visit (HOSPITAL_COMMUNITY): Payer: Self-pay | Admitting: Internal Medicine

## 2013-06-07 DIAGNOSIS — Z139 Encounter for screening, unspecified: Secondary | ICD-10-CM

## 2013-06-12 ENCOUNTER — Telehealth (HOSPITAL_COMMUNITY): Payer: Self-pay | Admitting: *Deleted

## 2013-06-12 ENCOUNTER — Ambulatory Visit (HOSPITAL_COMMUNITY): Payer: Managed Care, Other (non HMO)

## 2013-06-12 NOTE — Telephone Encounter (Signed)
noted 

## 2013-06-12 NOTE — Telephone Encounter (Signed)
  [facility] LOC[facility] AT [facility] [address] [place], [place] [address] [phone]   Our office has attempted to contact your patient.  twice by telephone and we have also sent an appointment letter to schedule the LEA test you ordered. The patient has not responded. We will not make any further attempts to contact the patient. If any further assistance is needed for this referral, please contact our office at [phone]  Sincerely, [facility] Scheduling Team

## 2013-06-18 ENCOUNTER — Ambulatory Visit (HOSPITAL_COMMUNITY)
Admission: RE | Admit: 2013-06-18 | Discharge: 2013-06-18 | Disposition: A | Discharge status: Home / Self Care | Payer: Managed Care, Other (non HMO) | Source: Ambulatory Visit | Attending: Internal Medicine | Admitting: Internal Medicine

## 2013-06-18 DIAGNOSIS — Z1231 Encounter for screening mammogram for malignant neoplasm of breast: Secondary | ICD-10-CM | POA: Insufficient documentation

## 2013-06-18 DIAGNOSIS — Z139 Encounter for screening, unspecified: Secondary | ICD-10-CM

## 2013-07-27 ENCOUNTER — Encounter: Payer: Self-pay | Admitting: Cardiovascular Disease

## 2013-07-27 ENCOUNTER — Ambulatory Visit (INDEPENDENT_AMBULATORY_CARE_PROVIDER_SITE_OTHER): Payer: Managed Care, Other (non HMO) | Admitting: Cardiovascular Disease

## 2013-07-27 VITALS — BP 100/60 | HR 82 | Ht 65.0 in | Wt 132.1 lb

## 2013-07-27 DIAGNOSIS — Z79899 Other long term (current) drug therapy: Secondary | ICD-10-CM

## 2013-07-27 DIAGNOSIS — E785 Hyperlipidemia, unspecified: Secondary | ICD-10-CM

## 2013-07-27 DIAGNOSIS — I10 Essential (primary) hypertension: Secondary | ICD-10-CM

## 2013-07-27 DIAGNOSIS — I739 Peripheral vascular disease, unspecified: Secondary | ICD-10-CM

## 2013-07-27 DIAGNOSIS — I701 Atherosclerosis of renal artery: Secondary | ICD-10-CM

## 2013-07-27 NOTE — Patient Instructions (Signed)
  Your physician wants you to follow-up with him in : 1 year with Dr Gwenlyn Found                                            and with an extender in : 6 months                    You will receive a reminder letter in the mail one month in advance. If you don't receive a letter, please call our office to schedule the follow-up appointment.   Your physician recommends that you return for lab work at your convenience, fasting   Your physician has ordered the following tests: renal and lower extremity dopplers in May 2015

## 2013-07-27 NOTE — Assessment & Plan Note (Signed)
Controlled on current medications 

## 2013-07-27 NOTE — Assessment & Plan Note (Signed)
Status post left common iliac artery stent by myself performed 03/25/12 with resultant improvement in her Dopplers and claudication. She did have an 80% left renal artery stenosis and a 50-60% right external iliac artery stenosis. Her last Doppler studies performed 12/12/12 revealed ABIs of 0.9 a bilaterally with patent stent. She denies claudication.

## 2013-07-27 NOTE — Assessment & Plan Note (Signed)
On statin therapy. We will recheck a lipid and liver profile 

## 2013-07-27 NOTE — Progress Notes (Signed)
07/27/2013 Connie Farrell   1960/12/26  [id number]  Primary Physician Delphina Cahill, MD Primary Cardiologist: Lorretta Harp MD Renae Gloss   HPI:  The patient is a 53 year old, thin appearing, married, Caucasian female, mother of two, grandmother to one grandchild, referred to me by [name] for peripheral vascular evaluation.   Her cardiac risk factor profile is positive for a 30 pack year of tobacco abuse, currently smoking one pack per day. Recalcitrant to risk factor modification. I did counsel her for greater than three minutes. Her other problems include hypertension and hyperlipidemia, as well as a strong family history for heart disease. She had a negative Myoview performed by [name] at [facility]. She did complain of claudication with Dopplers that suggested a mildly diminished left ABI performed at [facility] and confirmed in our lab suggesting of high grade left common iliac artery stenosis. I performed an angiogram on her April 04, 2012, confirming this with a 40-mm pull-back gradient. I stented her left common iliac artery with result in improvement in her left ABI from 0.82 to 0.96. Improvement in her Dopplers and claudication symptoms as well.   I saw her last 11/23/12 she denies chest pain, shortness of breath or claudication. She does continue to smoke.    Current Outpatient Prescriptions  Medication Sig Dispense Refill  . acetaminophen (TYLENOL) 650 MG CR tablet Take 650 mg by mouth every 8 (eight) hours as needed for pain.      Marland Kitchen aspirin EC 81 MG tablet Take 81 mg by mouth daily.      Marland Kitchen atorvastatin (LIPITOR) 40 MG tablet Take 40 mg by mouth daily.       . clopidogrel (PLAVIX) 75 MG tablet Take 1 tablet (75 mg total) by mouth daily with breakfast.  30 tablet  11  . cyclobenzaprine (FLEXERIL) 10 MG tablet Take 5-10 mg by mouth 2 (two) times daily as needed. For pain      . diphenhydrAMINE (BENADRYL) 25 MG tablet Take 25 mg by mouth every  6 (six) hours as needed.      Marland Kitchen escitalopram (LEXAPRO) 10 MG tablet Take 10 mg by mouth Daily.      Marland Kitchen lisinopril-hydrochlorothiazide (PRINZIDE,ZESTORETIC) 20-12.5 MG per tablet Take 1 tablet by mouth daily.       . naproxen sodium (ANAPROX) 220 MG tablet Take 220 mg by mouth 2 (two) times daily with a meal.      . pantoprazole (PROTONIX) 40 MG tablet Take 1 tablet (40 mg total) by mouth 2 (two) times daily.  60 tablet  5  . traMADol (ULTRAM) 50 MG tablet Take 50 mg by mouth every 6 (six) hours as needed. For pain       No current facility-administered medications for this visit.    Allergies  Allergen Reactions  . Codeine Nausea And Vomiting    History   Social History  . Marital Status: Married    Spouse Name: N/A    Number of Children: N/A  . Years of Education: N/A   Occupational History  . Not on file.   Social History Main Topics  . Smoking status: Former Smoker -- 1.00 packs/day for 36 years    Types: Cigarettes  . Smokeless tobacco: Never Used  . Alcohol Use: No  . Drug Use: No  . Sexual Activity: No   Other Topics Concern  . Not on file   Social History Narrative  . No narrative on file  Review of Systems: General: negative for chills, fever, night sweats or weight changes.  Cardiovascular: negative for chest pain, dyspnea on exertion, edema, orthopnea, palpitations, paroxysmal nocturnal dyspnea or shortness of breath Dermatological: negative for rash Respiratory: negative for cough or wheezing Urologic: negative for hematuria Abdominal: negative for nausea, vomiting, diarrhea, bright red blood per rectum, melena, or hematemesis Neurologic: negative for visual changes, syncope, or dizziness All other systems reviewed and are otherwise negative except as noted above.    Blood pressure 100/60, pulse 82, height 5\' 5"  (1.651 m), weight 132 lb 1.6 oz (59.92 kg).  General appearance: alert and no distress Neck: no adenopathy, no carotid bruit, no JVD,  supple, symmetrical, trachea midline and thyroid not enlarged, symmetric, no tenderness/mass/nodules Lungs: clear to auscultation bilaterally Heart: regular rate and rhythm, S1, S2 normal, no murmur, click, rub or gallop Extremities: extremities normal, atraumatic, no cyanosis or edema  EKG normal sinus rhythm 82 without ST or T wave changes  ASSESSMENT AND PLAN:   PAD (peripheral artery disease) Status post left common iliac artery stent by myself performed 03/25/12 with resultant improvement in her Dopplers and claudication. She did have an 80% left renal artery stenosis and a 50-60% right external iliac artery stenosis. Her last Doppler studies performed 12/12/12 revealed ABIs of 0.9 a bilaterally with patent stent. She denies claudication.  HLD (hyperlipidemia) On statin therapy. We will recheck a lipid and liver profile  HTN (hypertension) Controlled on current medications      Lorretta Harp MD [facility] 07/27/2013 12:23 PM

## 2013-07-30 ENCOUNTER — Telehealth (HOSPITAL_COMMUNITY): Payer: Self-pay | Admitting: *Deleted

## 2013-08-03 ENCOUNTER — Telehealth (HOSPITAL_COMMUNITY): Payer: Self-pay | Admitting: *Deleted

## 2013-08-22 ENCOUNTER — Telehealth (HOSPITAL_COMMUNITY): Payer: Self-pay | Admitting: *Deleted

## 2013-09-10 ENCOUNTER — Other Ambulatory Visit: Payer: Self-pay

## 2013-09-10 ENCOUNTER — Encounter (HOSPITAL_COMMUNITY)
Admission: EM | Disposition: A | Discharge status: Home / Self Care | Payer: Self-pay | Source: Home / Self Care | Attending: General Surgery

## 2013-09-10 ENCOUNTER — Ambulatory Visit (HOSPITAL_COMMUNITY)
Admission: RE | Admit: 2013-09-10 | Discharge: 2013-09-10 | Disposition: A | Discharge status: Home / Self Care | Payer: Managed Care, Other (non HMO) | Source: Ambulatory Visit | Attending: Internal Medicine | Admitting: Internal Medicine

## 2013-09-10 ENCOUNTER — Encounter (HOSPITAL_COMMUNITY): Payer: Managed Care, Other (non HMO) | Admitting: Anesthesiology

## 2013-09-10 ENCOUNTER — Emergency Department (HOSPITAL_COMMUNITY): Payer: Managed Care, Other (non HMO)

## 2013-09-10 ENCOUNTER — Observation Stay (HOSPITAL_COMMUNITY): Payer: Managed Care, Other (non HMO) | Admitting: Anesthesiology

## 2013-09-10 ENCOUNTER — Inpatient Hospital Stay (HOSPITAL_COMMUNITY)
Admission: EM | Admit: 2013-09-10 | Discharge: 2013-09-14 | DRG: 339 | Disposition: A | Discharge status: Home / Self Care | Payer: Managed Care, Other (non HMO) | Attending: General Surgery | Admitting: General Surgery

## 2013-09-10 ENCOUNTER — Other Ambulatory Visit (HOSPITAL_COMMUNITY): Payer: Self-pay | Admitting: Internal Medicine

## 2013-09-10 ENCOUNTER — Encounter (HOSPITAL_COMMUNITY): Payer: Self-pay | Admitting: Emergency Medicine

## 2013-09-10 DIAGNOSIS — E278 Other specified disorders of adrenal gland: Secondary | ICD-10-CM | POA: Insufficient documentation

## 2013-09-10 DIAGNOSIS — K3532 Acute appendicitis with perforation and localized peritonitis, without abscess: Secondary | ICD-10-CM | POA: Diagnosis present

## 2013-09-10 DIAGNOSIS — Z7982 Long term (current) use of aspirin: Secondary | ICD-10-CM

## 2013-09-10 DIAGNOSIS — F411 Generalized anxiety disorder: Secondary | ICD-10-CM | POA: Diagnosis present

## 2013-09-10 DIAGNOSIS — E860 Dehydration: Secondary | ICD-10-CM

## 2013-09-10 DIAGNOSIS — M129 Arthropathy, unspecified: Secondary | ICD-10-CM | POA: Diagnosis present

## 2013-09-10 DIAGNOSIS — R109 Unspecified abdominal pain: Secondary | ICD-10-CM

## 2013-09-10 DIAGNOSIS — Z79899 Other long term (current) drug therapy: Secondary | ICD-10-CM

## 2013-09-10 DIAGNOSIS — Z981 Arthrodesis status: Secondary | ICD-10-CM

## 2013-09-10 DIAGNOSIS — E785 Hyperlipidemia, unspecified: Secondary | ICD-10-CM | POA: Diagnosis present

## 2013-09-10 DIAGNOSIS — Z87891 Personal history of nicotine dependence: Secondary | ICD-10-CM

## 2013-09-10 DIAGNOSIS — K3580 Unspecified acute appendicitis: Secondary | ICD-10-CM | POA: Diagnosis present

## 2013-09-10 DIAGNOSIS — K3533 Acute appendicitis with perforation and localized peritonitis, with abscess: Principal | ICD-10-CM | POA: Diagnosis present

## 2013-09-10 DIAGNOSIS — Z825 Family history of asthma and other chronic lower respiratory diseases: Secondary | ICD-10-CM

## 2013-09-10 DIAGNOSIS — I739 Peripheral vascular disease, unspecified: Secondary | ICD-10-CM | POA: Diagnosis present

## 2013-09-10 DIAGNOSIS — K219 Gastro-esophageal reflux disease without esophagitis: Secondary | ICD-10-CM | POA: Diagnosis present

## 2013-09-10 DIAGNOSIS — Z7902 Long term (current) use of antithrombotics/antiplatelets: Secondary | ICD-10-CM

## 2013-09-10 DIAGNOSIS — I10 Essential (primary) hypertension: Secondary | ICD-10-CM | POA: Diagnosis present

## 2013-09-10 DIAGNOSIS — R933 Abnormal findings on diagnostic imaging of other parts of digestive tract: Secondary | ICD-10-CM | POA: Insufficient documentation

## 2013-09-10 DIAGNOSIS — E871 Hypo-osmolality and hyponatremia: Secondary | ICD-10-CM | POA: Diagnosis not present

## 2013-09-10 HISTORY — PX: LAPAROSCOPIC APPENDECTOMY: SHX408

## 2013-09-10 LAB — COMPREHENSIVE METABOLIC PANEL
ALBUMIN: 3.4 g/dL — AB (ref 3.5–5.2)
ALT: 13 U/L (ref 0–35)
AST: 12 U/L (ref 0–37)
Alkaline Phosphatase: 107 U/L (ref 39–117)
BILIRUBIN TOTAL: 0.5 mg/dL (ref 0.3–1.2)
BUN: 27 mg/dL — ABNORMAL HIGH (ref 6–23)
CHLORIDE: 90 meq/L — AB (ref 96–112)
CO2: 26 meq/L (ref 19–32)
Calcium: 9.4 mg/dL (ref 8.4–10.5)
Creatinine, Ser: 1.62 mg/dL — ABNORMAL HIGH (ref 0.50–1.10)
GFR calc Af Amer: 41 mL/min — ABNORMAL LOW (ref >90)
GFR, EST NON AFRICAN AMERICAN: 36 mL/min — AB (ref >90)
Glucose, Bld: 112 mg/dL — ABNORMAL HIGH (ref 70–99)
POTASSIUM: 3.4 meq/L — AB (ref 3.7–5.3)
SODIUM: 131 meq/L — AB (ref 137–147)
Total Protein: 7.7 g/dL (ref 6.0–8.3)

## 2013-09-10 LAB — CBC WITH DIFFERENTIAL/PLATELET
BASOS ABS: 0 10*3/uL (ref 0.0–0.1)
Basophils Relative: 0 % (ref 0–1)
Eosinophils Absolute: 0 10*3/uL (ref 0.0–0.7)
Eosinophils Relative: 0 % (ref 0–5)
HCT: 38.3 % (ref 36.0–46.0)
Hemoglobin: 12.8 g/dL (ref 12.0–15.0)
Lymphocytes Relative: 4 % — ABNORMAL LOW (ref 12–46)
Lymphs Abs: 0.6 10*3/uL — ABNORMAL LOW (ref 0.7–4.0)
MCH: 32.5 pg (ref 26.0–34.0)
MCHC: 33.4 g/dL (ref 30.0–36.0)
MCV: 97.2 fL (ref 78.0–100.0)
MONO ABS: 0.7 10*3/uL (ref 0.1–1.0)
MONOS PCT: 5 % (ref 3–12)
NEUTROS PCT: 91 % — AB (ref 43–77)
Neutro Abs: 12 10*3/uL — ABNORMAL HIGH (ref 1.7–7.7)
PLATELETS: 193 10*3/uL (ref 150–400)
RBC: 3.94 MIL/uL (ref 3.87–5.11)
RDW: 13.7 % (ref 11.5–15.5)
WBC: 13.2 10*3/uL — ABNORMAL HIGH (ref 4.0–10.5)

## 2013-09-10 LAB — URINALYSIS, ROUTINE W REFLEX MICROSCOPIC
Bilirubin Urine: NEGATIVE
Glucose, UA: NEGATIVE mg/dL
Ketones, ur: NEGATIVE mg/dL
Leukocytes, UA: NEGATIVE
NITRITE: NEGATIVE
Protein, ur: NEGATIVE mg/dL
UROBILINOGEN UA: 0.2 mg/dL (ref 0.0–1.0)
pH: 5 (ref 5.0–8.0)

## 2013-09-10 LAB — URINE MICROSCOPIC-ADD ON

## 2013-09-10 LAB — SURGICAL PCR SCREEN
MRSA, PCR: NEGATIVE
Staphylococcus aureus: NEGATIVE

## 2013-09-10 LAB — TYPE AND SCREEN
ABO/RH(D): A POS
ANTIBODY SCREEN: NEGATIVE

## 2013-09-10 LAB — PROTIME-INR
INR: 1.07 (ref 0.00–1.49)
PROTHROMBIN TIME: 13.7 s (ref 11.6–15.2)

## 2013-09-10 LAB — APTT: aPTT: 29 seconds (ref 24–37)

## 2013-09-10 LAB — LIPASE, BLOOD: Lipase: 17 U/L (ref 11–59)

## 2013-09-10 SURGERY — APPENDECTOMY, LAPAROSCOPIC
Anesthesia: General | Site: Abdomen

## 2013-09-10 MED ORDER — SODIUM CHLORIDE 0.9 % IV BOLUS (SEPSIS): 1000.0000 mL | Freq: Once | INTRAVENOUS | Status: AC

## 2013-09-10 MED ORDER — NICOTINE 21 MG/24HR TD PT24
21.0000 mg | MEDICATED_PATCH | Freq: Every day | TRANSDERMAL | Status: DC
  Administered 2013-09-11 – 2013-09-14 (×4): 21 mg via TRANSDERMAL
  Filled 2013-09-10 (×4): qty 1

## 2013-09-10 MED ORDER — IOHEXOL 300 MG/ML  SOLN
100.0000 mL | Freq: Once | INTRAMUSCULAR | Status: AC | PRN
  Administered 2013-09-10: 100 mL via INTRAVENOUS

## 2013-09-10 MED ORDER — TRAMADOL HCL 50 MG PO TABS
50.0000 mg | ORAL_TABLET | Freq: Four times a day (QID) | ORAL | Status: DC | PRN
  Administered 2013-09-14: 50 mg via ORAL
  Filled 2013-09-10: qty 1

## 2013-09-10 MED ORDER — ONDANSETRON HCL 4 MG/2ML IJ SOLN
INTRAMUSCULAR | Status: DC | PRN
  Administered 2013-09-10: 4 mg via INTRAVENOUS

## 2013-09-10 MED ORDER — FENTANYL CITRATE 0.05 MG/ML IJ SOLN
INTRAMUSCULAR | Status: DC | PRN
  Administered 2013-09-10: 25 ug via INTRAVENOUS
  Administered 2013-09-10 (×5): 50 ug via INTRAVENOUS
  Administered 2013-09-10: 25 ug via INTRAVENOUS

## 2013-09-10 MED ORDER — PROPOFOL 10 MG/ML IV BOLUS
INTRAVENOUS | Status: DC | PRN
  Administered 2013-09-10: 110 mg via INTRAVENOUS

## 2013-09-10 MED ORDER — HYDROMORPHONE HCL PF 1 MG/ML IJ SOLN
1.0000 mg | INTRAMUSCULAR | Status: DC | PRN
  Administered 2013-09-10 – 2013-09-14 (×24): 1 mg via INTRAVENOUS
  Filled 2013-09-10 (×24): qty 1

## 2013-09-10 MED ORDER — ONDANSETRON HCL 4 MG PO TABS: 4.0000 mg | ORAL_TABLET | Freq: Four times a day (QID) | ORAL | Status: DC | PRN

## 2013-09-10 MED ORDER — ALBUTEROL SULFATE (2.5 MG/3ML) 0.083% IN NEBU: 2.5000 mg | INHALATION_SOLUTION | RESPIRATORY_TRACT | Status: DC | PRN

## 2013-09-10 MED ORDER — LORAZEPAM 2 MG/ML IJ SOLN: 1.0000 mg | INTRAMUSCULAR | Status: DC | PRN

## 2013-09-10 MED ORDER — MIDAZOLAM HCL 2 MG/2ML IJ SOLN
INTRAMUSCULAR | Status: AC
  Filled 2013-09-10: qty 2

## 2013-09-10 MED ORDER — BUPIVACAINE HCL (PF) 0.5 % IJ SOLN
INTRAMUSCULAR | Status: AC
  Filled 2013-09-10: qty 30

## 2013-09-10 MED ORDER — BUPIVACAINE HCL (PF) 0.5 % IJ SOLN
INTRAMUSCULAR | Status: DC | PRN
  Administered 2013-09-10: 10 mL

## 2013-09-10 MED ORDER — PROPOFOL 10 MG/ML IV EMUL
INTRAVENOUS | Status: AC
  Filled 2013-09-10: qty 20

## 2013-09-10 MED ORDER — ESCITALOPRAM OXALATE 10 MG PO TABS
10.0000 mg | ORAL_TABLET | Freq: Every day | ORAL | Status: DC
  Administered 2013-09-11 – 2013-09-14 (×4): 10 mg via ORAL
  Filled 2013-09-10 (×5): qty 1

## 2013-09-10 MED ORDER — FENTANYL CITRATE 0.05 MG/ML IJ SOLN
INTRAMUSCULAR | Status: AC
Start: 2013-09-10 — End: 2013-09-10
  Filled 2013-09-10: qty 2

## 2013-09-10 MED ORDER — LACTATED RINGERS IV SOLN
INTRAVENOUS | Status: DC | PRN
  Administered 2013-09-10: 21:00:00 via INTRAVENOUS

## 2013-09-10 MED ORDER — ONDANSETRON HCL 4 MG/2ML IJ SOLN
INTRAMUSCULAR | Status: AC
  Filled 2013-09-10: qty 2

## 2013-09-10 MED ORDER — MIDAZOLAM HCL 5 MG/5ML IJ SOLN
INTRAMUSCULAR | Status: DC | PRN
  Administered 2013-09-10 (×2): 1 mg via INTRAVENOUS

## 2013-09-10 MED ORDER — SODIUM CHLORIDE 0.9 % IV BOLUS (SEPSIS)
1000.0000 mL | Freq: Once | INTRAVENOUS | Status: AC
  Administered 2013-09-10: 1000 mL via INTRAVENOUS

## 2013-09-10 MED ORDER — PIPERACILLIN-TAZOBACTAM 3.375 G IVPB 30 MIN
3.3750 g | Freq: Once | INTRAVENOUS | Status: AC
  Administered 2013-09-10: 3.375 g via INTRAVENOUS
  Filled 2013-09-10 (×2): qty 50

## 2013-09-10 MED ORDER — ROCURONIUM BROMIDE 100 MG/10ML IV SOLN
INTRAVENOUS | Status: DC | PRN
  Administered 2013-09-10: 5 mg via INTRAVENOUS
  Administered 2013-09-10: 25 mg via INTRAVENOUS

## 2013-09-10 MED ORDER — LISINOPRIL 10 MG PO TABS
20.0000 mg | ORAL_TABLET | Freq: Every day | ORAL | Status: DC
  Administered 2013-09-12 – 2013-09-14 (×3): 20 mg via ORAL
  Filled 2013-09-10 (×3): qty 2

## 2013-09-10 MED ORDER — ENOXAPARIN SODIUM 40 MG/0.4ML ~~LOC~~ SOLN
40.0000 mg | Freq: Once | SUBCUTANEOUS | Status: AC
  Administered 2013-09-10: 40 mg via SUBCUTANEOUS
  Filled 2013-09-10: qty 0.4

## 2013-09-10 MED ORDER — NEOSTIGMINE METHYLSULFATE 1 MG/ML IJ SOLN
INTRAMUSCULAR | Status: DC | PRN
  Administered 2013-09-10: 2 mg via INTRAVENOUS

## 2013-09-10 MED ORDER — SUCCINYLCHOLINE CHLORIDE 20 MG/ML IJ SOLN
INTRAMUSCULAR | Status: AC
  Filled 2013-09-10: qty 1

## 2013-09-10 MED ORDER — ONDANSETRON HCL 4 MG/2ML IJ SOLN
4.0000 mg | Freq: Four times a day (QID) | INTRAMUSCULAR | Status: DC | PRN
  Administered 2013-09-12 – 2013-09-13 (×5): 4 mg via INTRAVENOUS
  Filled 2013-09-10 (×6): qty 2

## 2013-09-10 MED ORDER — PANTOPRAZOLE SODIUM 40 MG PO TBEC
40.0000 mg | DELAYED_RELEASE_TABLET | Freq: Every day | ORAL | Status: DC
Start: 2013-09-11 — End: 2013-09-14
  Administered 2013-09-11 – 2013-09-13 (×3): 40 mg via ORAL
  Filled 2013-09-10 (×3): qty 1

## 2013-09-10 MED ORDER — KETOROLAC TROMETHAMINE 30 MG/ML IJ SOLN
30.0000 mg | Freq: Once | INTRAMUSCULAR | Status: AC
  Administered 2013-09-10: 30 mg via INTRAVENOUS

## 2013-09-10 MED ORDER — ROCURONIUM BROMIDE 50 MG/5ML IV SOLN
INTRAVENOUS | Status: AC
  Filled 2013-09-10: qty 1

## 2013-09-10 MED ORDER — PIPERACILLIN-TAZOBACTAM 3.375 G IVPB
3.3750 g | Freq: Three times a day (TID) | INTRAVENOUS | Status: DC
  Administered 2013-09-11 – 2013-09-14 (×11): 3.375 g via INTRAVENOUS
  Filled 2013-09-10 (×20): qty 50

## 2013-09-10 MED ORDER — SODIUM CHLORIDE 0.9 % IR SOLN
Status: DC | PRN
  Administered 2013-09-10: 3000 mL

## 2013-09-10 MED ORDER — SUCCINYLCHOLINE CHLORIDE 20 MG/ML IJ SOLN
INTRAMUSCULAR | Status: DC | PRN
  Administered 2013-09-10: 100 mg via INTRAVENOUS

## 2013-09-10 MED ORDER — LACTATED RINGERS IV SOLN
INTRAVENOUS | Status: DC
  Administered 2013-09-10: via INTRAVENOUS

## 2013-09-10 MED ORDER — GLYCOPYRROLATE 0.2 MG/ML IJ SOLN
INTRAMUSCULAR | Status: DC | PRN
  Administered 2013-09-10: 0.3 mg via INTRAVENOUS

## 2013-09-10 MED ORDER — KETOROLAC TROMETHAMINE 30 MG/ML IJ SOLN
INTRAMUSCULAR | Status: AC
  Filled 2013-09-10: qty 1

## 2013-09-10 MED ORDER — FENTANYL CITRATE 0.05 MG/ML IJ SOLN
50.0000 ug | Freq: Once | INTRAMUSCULAR | Status: AC
  Administered 2013-09-10: 50 ug via INTRAVENOUS
  Filled 2013-09-10: qty 2

## 2013-09-10 MED ORDER — SODIUM CHLORIDE 0.9 % IR SOLN
Status: DC | PRN
  Administered 2013-09-10: 1000 mL

## 2013-09-10 MED ORDER — FENTANYL CITRATE 0.05 MG/ML IJ SOLN
INTRAMUSCULAR | Status: AC
  Filled 2013-09-10: qty 5

## 2013-09-10 MED ORDER — GLYCOPYRROLATE 0.2 MG/ML IJ SOLN
INTRAMUSCULAR | Status: AC
  Filled 2013-09-10: qty 2

## 2013-09-10 MED ORDER — LIDOCAINE HCL 1 % IJ SOLN
INTRAMUSCULAR | Status: DC | PRN
  Administered 2013-09-10: 30 mg via INTRADERMAL

## 2013-09-10 MED ORDER — HYDROCHLOROTHIAZIDE 12.5 MG PO CAPS
12.5000 mg | ORAL_CAPSULE | Freq: Every day | ORAL | Status: DC
  Administered 2013-09-12 – 2013-09-14 (×3): 12.5 mg via ORAL
  Filled 2013-09-10 (×3): qty 1

## 2013-09-10 MED ORDER — ACETAMINOPHEN 500 MG PO TABS
1000.0000 mg | ORAL_TABLET | Freq: Four times a day (QID) | ORAL | Status: AC
  Administered 2013-09-11 (×4): 1000 mg via ORAL
  Filled 2013-09-10 (×4): qty 2

## 2013-09-10 MED ORDER — LIDOCAINE HCL (PF) 1 % IJ SOLN
INTRAMUSCULAR | Status: AC
  Filled 2013-09-10: qty 5

## 2013-09-10 MED ORDER — ENOXAPARIN SODIUM 40 MG/0.4ML ~~LOC~~ SOLN
40.0000 mg | SUBCUTANEOUS | Status: DC
  Administered 2013-09-11 – 2013-09-13 (×3): 40 mg via SUBCUTANEOUS
  Filled 2013-09-10 (×3): qty 0.4

## 2013-09-10 SURGICAL SUPPLY — 54 items
BAG HAMPER (MISCELLANEOUS) ×2 IMPLANT
CATH FOLEY LATEX FREE 16FR (CATHETERS) ×1
CATH FOLEY LF 16FR (CATHETERS) ×1 IMPLANT
CLOTH BEACON ORANGE TIMEOUT ST (SAFETY) ×2 IMPLANT
COVER LIGHT HANDLE STERIS (MISCELLANEOUS) ×4 IMPLANT
CUTTER FLEX LINEAR 45M (STAPLE) IMPLANT
CUTTER LINEAR ENDO 35 ETS (STAPLE) IMPLANT
CUTTER LINEAR ENDO 35 ETS TH (STAPLE) ×2 IMPLANT
DECANTER SPIKE VIAL GLASS SM (MISCELLANEOUS) ×2 IMPLANT
DISSECTOR BLUNT TIP ENDO 5MM (MISCELLANEOUS) IMPLANT
DURAPREP 26ML APPLICATOR (WOUND CARE) ×2 IMPLANT
ELECT REM PT RETURN 9FT ADLT (ELECTROSURGICAL) ×2
ELECTRODE REM PT RTRN 9FT ADLT (ELECTROSURGICAL) ×1 IMPLANT
EVACUATOR DRAINAGE 10X20 100CC (DRAIN) ×1 IMPLANT
EVACUATOR SILICONE 100CC (DRAIN) ×1
FILTER SMOKE EVAC LAPAROSHD (FILTER) ×2 IMPLANT
FORMALIN 10 PREFIL 120ML (MISCELLANEOUS) ×2 IMPLANT
GLOVE BIO SURGEON STRL SZ7.5 (GLOVE) IMPLANT
GLOVE BIOGEL PI IND STRL 7.0 (GLOVE) ×2 IMPLANT
GLOVE BIOGEL PI IND STRL 7.5 (GLOVE) ×2 IMPLANT
GLOVE BIOGEL PI INDICATOR 7.0 (GLOVE) ×2
GLOVE BIOGEL PI INDICATOR 7.5 (GLOVE) ×2
GLOVE SKINSENSE NS SZ6.5 (GLOVE) ×1
GLOVE SKINSENSE NS SZ7.5 (GLOVE) ×1
GLOVE SKINSENSE STRL SZ6.5 (GLOVE) ×1 IMPLANT
GLOVE SKINSENSE STRL SZ7.5 (GLOVE) ×1 IMPLANT
GOWN STRL REUS W/TWL LRG LVL3 (GOWN DISPOSABLE) ×4 IMPLANT
INST SET LAPROSCOPIC AP (KITS) ×2 IMPLANT
IV NS IRRIG 3000ML ARTHROMATIC (IV SOLUTION) ×2 IMPLANT
KIT ROOM TURNOVER APOR (KITS) ×2 IMPLANT
MANIFOLD NEPTUNE II (INSTRUMENTS) ×2 IMPLANT
NEEDLE INSUFFLATION 14GA 120MM (NEEDLE) ×2 IMPLANT
NS IRRIG 1000ML POUR BTL (IV SOLUTION) ×2 IMPLANT
PACK LAP CHOLE LZT030E (CUSTOM PROCEDURE TRAY) ×2 IMPLANT
PAD ARMBOARD 7.5X6 YLW CONV (MISCELLANEOUS) ×2 IMPLANT
POUCH SPECIMEN RETRIEVAL 10MM (ENDOMECHANICALS) ×2 IMPLANT
RELOAD /EVU35 (ENDOMECHANICALS) IMPLANT
RELOAD 45 VASCULAR/THIN (ENDOMECHANICALS) IMPLANT
RELOAD CUTTER ETS 35MM STAND (ENDOMECHANICALS) IMPLANT
SCALPEL HARMONIC ACE (MISCELLANEOUS) ×2 IMPLANT
SET BASIN LINEN APH (SET/KITS/TRAYS/PACK) ×2 IMPLANT
SET TUBE IRRIG SUCTION NO TIP (IRRIGATION / IRRIGATOR) ×2 IMPLANT
SPONGE DRAIN TRACH 4X4 STRL 2S (GAUZE/BANDAGES/DRESSINGS) ×2 IMPLANT
SPONGE GAUZE 2X2 8PLY STRL LF (GAUZE/BANDAGES/DRESSINGS) ×4 IMPLANT
STAPLER VISISTAT (STAPLE) ×2 IMPLANT
SUT VICRYL 0 UR6 27IN ABS (SUTURE) ×2 IMPLANT
TAPE CLOTH SURG 4X10 WHT LF (GAUZE/BANDAGES/DRESSINGS) ×2 IMPLANT
TRAY FOLEY CATH 16FR SILVER (SET/KITS/TRAYS/PACK) IMPLANT
TROCAR ENDO BLADELESS 11MM (ENDOMECHANICALS) ×2 IMPLANT
TROCAR ENDO BLADELESS 12MM (ENDOMECHANICALS) ×2 IMPLANT
TROCAR XCEL NON-BLD 5MMX100MML (ENDOMECHANICALS) ×2 IMPLANT
TUBING INSUFFLATION (TUBING) ×2 IMPLANT
WARMER LAPAROSCOPE (MISCELLANEOUS) ×2 IMPLANT
YANKAUER SUCT 12FT TUBE ARGYLE (SUCTIONS) ×2 IMPLANT

## 2013-09-10 NOTE — H&P (Signed)
Connie Farrell is an 53 y.o. female.   Chief Complaint: Right lower quadrant abdominal pain HPI: Patient is a 53 year old white female who presents with a three-day history of right lower quadrant abdominal pain. An outpatient CT scan was performed which revealed acute appendicitis with possible early periappendiceal abscess formation. There was no evidence of perforation noted. She also had a drink [facility] just prior to coming to the emergency room after being contacted to come to the emergency room. Patient states she has had decreased appetite. No fever or chills have been noted.  Past Medical History  Diagnosis Date  . Hypertension   . Seasonal allergies   . Neck pain, chronic   . Hyperlipemia   . PAD (peripheral artery disease)   . Exertional dyspnea   . GERD (gastroesophageal reflux disease)   . Arthritis     "hands; spine too" (04/04/2012)  . Anxiety   . Tobacco abuse     Past Surgical History  Procedure Laterality Date  . Anterior cervical decomp/discectomy fusion  ~ 04/2006    C6, 7, T1  . Tubal ligation  1990  . Peripheral arterial stent graft  04/04/2012    Family History  Problem Relation Age of Onset  . Heart disease Other   . Arthritis    . Asthma     Social History:  reports that she has quit smoking. Her smoking use included Cigarettes. She has a 36 pack-year smoking history. She has never used smokeless tobacco. She reports that she does not drink alcohol or use illicit drugs.  Allergies:  Allergies  Allergen Reactions  . Latex Rash    Blisters, rash  . Codeine Nausea And Vomiting    Medications Prior to Admission  Medication Sig Dispense Refill  . acetaminophen (TYLENOL) 650 MG CR tablet Take 650 mg by mouth every 8 (eight) hours as needed for pain.      Marland Kitchen aspirin EC 81 MG tablet Take 81 mg by mouth daily.      Marland Kitchen atorvastatin (LIPITOR) 40 MG tablet Take 40 mg by mouth daily.       . clopidogrel (PLAVIX) 75 MG tablet Take 1 tablet (75 mg total) by  mouth daily with breakfast.  30 tablet  11  . cyclobenzaprine (FLEXERIL) 10 MG tablet Take 5-10 mg by mouth 2 (two) times daily as needed. For pain      . diphenhydrAMINE (BENADRYL) 25 MG tablet Take 25 mg by mouth every 6 (six) hours as needed.      Marland Kitchen escitalopram (LEXAPRO) 10 MG tablet Take 10 mg by mouth Daily.      Marland Kitchen lisinopril-hydrochlorothiazide (PRINZIDE,ZESTORETIC) 20-12.5 MG per tablet Take 1 tablet by mouth daily.       . naproxen sodium (ANAPROX) 220 MG tablet Take 220 mg by mouth 2 (two) times daily with a meal.      . pantoprazole (PROTONIX) 40 MG tablet Take 1 tablet (40 mg total) by mouth 2 (two) times daily.  60 tablet  5  . traMADol (ULTRAM) 50 MG tablet Take 50 mg by mouth every 6 (six) hours as needed. For pain        Results for orders placed during the hospital encounter of 09/10/13 (from the past 48 hour(s))  COMPREHENSIVE METABOLIC PANEL     Status: Abnormal   Collection Time    09/10/13  3:19 PM      Result Value Ref Range   Sodium 131 (*) 137 - 147 mEq/L  Potassium 3.4 (*) 3.7 - 5.3 mEq/L   Chloride 90 (*) 96 - 112 mEq/L   CO2 26  19 - 32 mEq/L   Glucose, Bld 112 (*) 70 - 99 mg/dL   BUN 27 (*) 6 - 23 mg/dL   Creatinine, Ser 1.62 (*) 0.50 - 1.10 mg/dL   Calcium 9.4  8.4 - 10.5 mg/dL   Total Protein 7.7  6.0 - 8.3 g/dL   Albumin 3.4 (*) 3.5 - 5.2 g/dL   AST 12  0 - 37 U/L   ALT 13  0 - 35 U/L   Alkaline Phosphatase 107  39 - 117 U/L   Total Bilirubin 0.5  0.3 - 1.2 mg/dL   GFR calc non Af Amer 36 (*) >90 mL/min   GFR calc Af Amer 41 (*) >90 mL/min   Comment: (NOTE)     The eGFR has been calculated using the CKD EPI equation.     This calculation has not been validated in all clinical situations.     eGFR's persistently <90 mL/min signify possible Chronic Kidney     Disease.  CBC WITH DIFFERENTIAL     Status: Abnormal   Collection Time    09/10/13  3:19 PM      Result Value Ref Range   WBC 13.2 (*) 4.0 - 10.5 K/uL   RBC 3.94  3.87 - 5.11 MIL/uL    Hemoglobin 12.8  12.0 - 15.0 g/dL   HCT 38.3  36.0 - 46.0 %   MCV 97.2  78.0 - 100.0 fL   MCH 32.5  26.0 - 34.0 pg   MCHC 33.4  30.0 - 36.0 g/dL   RDW 13.7  11.5 - 15.5 %   Platelets 193  150 - 400 K/uL   Neutrophils Relative % 91 (*) 43 - 77 %   Neutro Abs 12.0 (*) 1.7 - 7.7 K/uL   Lymphocytes Relative 4 (*) 12 - 46 %   Lymphs Abs 0.6 (*) 0.7 - 4.0 K/uL   Monocytes Relative 5  3 - 12 %   Monocytes Absolute 0.7  0.1 - 1.0 K/uL   Eosinophils Relative 0  0 - 5 %   Eosinophils Absolute 0.0  0.0 - 0.7 K/uL   Basophils Relative 0  0 - 1 %   Basophils Absolute 0.0  0.0 - 0.1 K/uL  LIPASE, BLOOD     Status: None   Collection Time    09/10/13  3:19 PM      Result Value Ref Range   Lipase 17  11 - 59 U/L  PROTIME-INR     Status: None   Collection Time    09/10/13  3:19 PM      Result Value Ref Range   Prothrombin Time 13.7  11.6 - 15.2 seconds   INR 1.07  0.00 - 1.49  APTT     Status: None   Collection Time    09/10/13  3:19 PM      Result Value Ref Range   aPTT 29  24 - 37 seconds  TYPE AND SCREEN     Status: None   Collection Time    09/10/13  3:21 PM      Result Value Ref Range   ABO/RH(D) A POS     Antibody Screen NEG     Sample Expiration 09/13/2013    URINALYSIS, ROUTINE W REFLEX MICROSCOPIC     Status: Abnormal   Collection Time    09/10/13  4:24 PM  Result Value Ref Range   Color, Urine YELLOW  YELLOW   APPearance CLEAR  CLEAR   Specific Gravity, Urine <1.005 (*) 1.005 - 1.030   pH 5.0  5.0 - 8.0   Glucose, UA NEGATIVE  NEGATIVE mg/dL   Hgb urine dipstick LARGE (*) NEGATIVE   Bilirubin Urine NEGATIVE  NEGATIVE   Ketones, ur NEGATIVE  NEGATIVE mg/dL   Protein, ur NEGATIVE  NEGATIVE mg/dL   Urobilinogen, UA 0.2  0.0 - 1.0 mg/dL   Nitrite NEGATIVE  NEGATIVE   Leukocytes, UA NEGATIVE  NEGATIVE  URINE MICROSCOPIC-ADD ON     Status: Abnormal   Collection Time    09/10/13  4:24 PM      Result Value Ref Range   Squamous Epithelial / LPF FEW (*) RARE   WBC, UA  0-2  <3 WBC/hpf   RBC / HPF 7-10  <3 RBC/hpf   Bacteria, UA FEW (*) RARE  SURGICAL PCR SCREEN     Status: None   Collection Time    09/10/13  5:55 PM      Result Value Ref Range   MRSA, PCR NEGATIVE  NEGATIVE   Staphylococcus aureus NEGATIVE  NEGATIVE   Comment:            The Xpert SA Assay (FDA     approved for NASAL specimens     in patients over 4 years of age),     is one component of     a comprehensive surveillance     program.  Test performance has     been validated by [employer] for patients greater     than or equal to 37 year old.     It is not intended     to diagnose infection nor to     guide or monitor treatment.   Ct Abdomen Pelvis W Contrast  09/10/2013   CLINICAL DATA:  Lower abdominal pain for the preceding 3 days  EXAM: CT ABDOMEN AND PELVIS WITH CONTRAST  TECHNIQUE: Multidetector CT imaging of the abdomen and pelvis was performed using the standard protocol following bolus administration of intravenous contrast.  CONTRAST:  14m OMNIPAQUE IOHEXOL 300 MG/ML SOLN intravenously, the patient also received oral contrast material.  COMPARISON:  None.  FINDINGS: The appendix is edematous and there is surrounding inflammatory change. It is visualized on images 51 through 68. The adjacent cecum demonstrates considerable fluid including contrast. Proximal to this the small bowel exhibits minimal distention but no obstructive findings. The ascending and transverse and descending portions of the colon exhibit no acute abnormalities. A small amount of periappendiceal fluid is suspected on images 53-56, but no significant free extraluminal fluid or gas collections are demonstrated elsewhere.  The liver, gallbladder, pancreas, spleen, partially distended stomach, and kidneys are normal in appearance. The adrenal glands are mildly enlarged bilaterally. The caliber of the abdominal aorta is normal. There is a left common iliac artery stent graft in place. There is no periaortic or  pericaval lymphadenopathy. The uterus and adnexal structures exhibit no acute abnormalities. The partially distended urinary bladder is normal in appearance. There is a small fat containing left inguinal hernia. There is no significant umbilical hernia.  The lumbar vertebral bodies are preserved in height. There is disc space narrowing at L4-5. The observed portions of the bony pelvis appear normal.  IMPRESSION: 1. The findings are consistent with acute appendicitis. There is a small amount of pericholecystic fluid versus a tiny abscess developing  on images 53 through 56. No extraluminal gas collections are demonstrated. The bowel otherwise exhibits no acute abnormality. 2. There is no evidence of acute hepatobiliary or urinary tract abnormality. 3. There is bilateral adrenal enlargement. The adrenal glands were not included in the field of view on a chest CT scan dated Dec 02, 2011. When the patient has recovered from her acute illness, follow-up adrenal MRI would be useful. 4. These results were called by telephone at the time of interpretation on 09/10/2013 at 1:30 PM to Gaetana Michaelis, RT, in the radiology department at [facility] where the patient is waiting. [name] is going to contact Dr. Nevada Crane for referral to the ED.   Electronically Signed   By: David  Martinique   On: 09/10/2013 13:33   Dg Chest Portable 1 View  09/10/2013   CLINICAL DATA:  Pain  EXAM: PORTABLE CHEST - 1 VIEW  COMPARISON:  None.  FINDINGS: The heart size and mediastinal contours are within normal limits. Both lungs are clear. The visualized skeletal structures are unremarkable.  IMPRESSION: No active disease.   Electronically Signed   By: Margaree Mackintosh M.D.   On: 09/10/2013 15:40    Review of Systems  Constitutional: Positive for malaise/fatigue.  HENT: Negative.   Eyes: Negative.   Respiratory: Positive for cough.   Cardiovascular: Negative.   Gastrointestinal: Positive for nausea and abdominal pain.  Genitourinary:  Negative.   Musculoskeletal: Negative.   All other systems reviewed and are negative.    Blood pressure 85/58, pulse 79, temperature 99.2 F (37.3 C), temperature source Oral, resp. rate 18, height '5\' 5"'  (1.651 m), weight 60.328 kg (133 lb), SpO2 94.00%. Physical Exam  Vitals reviewed. Constitutional: She is oriented to person, place, and time. She appears well-developed and well-nourished.  HENT:  Head: Normocephalic and atraumatic.  Neck: Normal range of motion. Neck supple.  Cardiovascular: Normal rate, regular rhythm and normal heart sounds.   Respiratory: Effort normal and breath sounds normal.  GI: Soft. There is tenderness. There is no rebound.  Tender in the right lower quadrant to palpation. No rigidity noted.  Neurological: She is alert and oriented to person, place, and time.  Skin: Skin is warm and dry.     Assessment/Plan Impression: Acute appendicitis Plan: Patient retaken to the operating room for laparoscopic appendectomy. The risks and benefits of the procedure including bleeding, infection, and the possibility of an open procedure were fully explained to the patient, who gave informed consent.  Aluel Schwarz A 09/10/2013, 8:16 PM

## 2013-09-10 NOTE — Transfer of Care (Signed)
Immediate Anesthesia Transfer of Care Note  Patient: Connie Farrell  Procedure(s) Performed: Procedure(s): APPENDECTOMY LAPAROSCOPIC (N/A)  Patient Location: PACU  Anesthesia Type:General  Level of Consciousness: awake, alert  and oriented  Airway & Oxygen Therapy: Patient Spontanous Breathing and Patient connected to face mask oxygen  Post-op Assessment: Report given to PACU RN  Post vital signs: Reviewed and stable  Complications: No apparent anesthesia complications

## 2013-09-10 NOTE — Addendum Note (Signed)
Addendum created 09/10/13 2238 by Ollen Bowl, CRNA   Modules edited: Anesthesia Medication Administration

## 2013-09-10 NOTE — Anesthesia Preprocedure Evaluation (Addendum)
Anesthesia Evaluation  Patient identified by MRN, date of birth, ID band Patient awake    Reviewed: Allergy & Precautions, H&P , NPO status , Patient's Chart, lab work & pertinent test results  Airway Mallampati: I TM Distance: >3 FB     Dental  (+) Teeth Intact   Pulmonary asthma , former smoker,  breath sounds clear to auscultation        Cardiovascular Exercise Tolerance: Poor hypertension, + CAD and + DOE Rhythm:Regular Rate:Tachycardia     Neuro/Psych S/P ACDF    GI/Hepatic GERD-  Medicated,  Endo/Other    Renal/GU      Musculoskeletal   Abdominal (+)  Abdomen: soft.    Peds  Hematology   Anesthesia Other Findings   Reproductive/Obstetrics                         Anesthesia Physical Anesthesia Plan  ASA: III and emergent  Anesthesia Plan: General   Post-op Pain Management:    Induction: Intravenous, Rapid sequence and Cricoid pressure planned  Airway Management Planned: Oral ETT  Additional Equipment:   Intra-op Plan:   Post-operative Plan: Extubation in OR  Informed Consent: I have reviewed the patients History and Physical, chart, labs and discussed the procedure including the risks, benefits and alternatives for the proposed anesthesia with the patient or authorized representative who has indicated his/her understanding and acceptance.     Plan Discussed with: Anesthesiologist  Anesthesia Plan Comments: (Telephone consult with Dt. Patsey Berthold, agreeable to GOT/RSI)        Anesthesia Quick Evaluation

## 2013-09-10 NOTE — Anesthesia Procedure Notes (Signed)
Procedure Name: Intubation Date/Time: 09/10/2013 9:18 PM Performed by: Tressie Stalker E Pre-anesthesia Checklist: Patient identified, Patient being monitored, Timeout performed, Emergency Drugs available and Suction available Patient Re-evaluated:Patient Re-evaluated prior to inductionOxygen Delivery Method: Circle System Utilized Preoxygenation: Pre-oxygenation with 100% oxygen Intubation Type: IV induction, Rapid sequence and Cricoid Pressure applied Ventilation: Mask ventilation without difficulty Laryngoscope Size: Mac and 3 Grade View: Grade I Tube type: Oral Tube size: 7.0 mm Number of attempts: 1 Airway Equipment and Method: stylet Placement Confirmation: ETT inserted through vocal cords under direct vision,  positive ETCO2 and breath sounds checked- equal and bilateral Secured at: 21 cm Tube secured with: Tape Dental Injury: Teeth and Oropharynx as per pre-operative assessment

## 2013-09-10 NOTE — Addendum Note (Signed)
Addendum created 09/10/13 2242 by Ollen Bowl, CRNA   Modules edited: Flowsheet VN   Flowsheet VN:  [phone] Status

## 2013-09-10 NOTE — Op Note (Signed)
Patient:  Connie Farrell  DOB:  Sep 26, 1960  MRN:  [id number]   Preop Diagnosis:  Acute appendicitis  Postop Diagnosis:  Gangrenous appendicitis with perforation  Procedure:  Laparoscopic appendectomy  Surgeon:  Aviva Signs, M.D.  Anes:  General endotracheal  Indications:  Patient is a 53 year old white female who has had a three-day history of worsening lower abdominal pain. An outpatient CT scan was performed which revealed acute appendicitis with possible abscess formation. The patient now comes the operating room for laparoscopic appendectomy. The risks and benefits of the procedure the bleeding, infection, and the possibility of an open procedure were fully explained to the patient, who gave informed consent.  Procedure note:  The patient is placed the supine position. After induction of general endotracheal anesthesia, the abdomen was prepped and draped using usual sterile technique with DuraPrep. Surgical site confirmation was performed.  A supraumbilical incision was made down to the fascia. A Veress needle was introduced into the abdominal cavity and confirmation of placement was done using the saline drop test. The abdomen was then insufflated to 16 mm mercury pressure. An 11 mm trocar was introduced into the abdominal cavity under direct visualization without difficulty. Patient was placed in deeper Trendelenburg position and additional 12 mm trocar was placed the suprapubic region a 5 mm trocar was placed left lower quadrant region. Inspection of the right lower quadrant revealed purulent fluid to be present. Omentum was noted to be covering the cecum which was dilated and more midline in nature. The appendix was visualized and noted to be gangrenous through most of its length. The mesoappendix was divided using the harmonic scalpel. The base of the appendix was identified and a vascular Endo GIA was placed across the base the appendix and fired. The appendix was then removed using an  Endo Catch bag without difficulty. The right lower quadrant was then copiously irrigated normal saline. A #10 flat Jackson-Pratt drain was then placed into the right lower quadrant and brought out through the 5 mm trocar site. It was secured at the skin level using 3-0 nylon interrupted suture. All fluid and air were then evacuated from the abdominal cavity prior to removal of the trochars.  All wounds were irrigated with normal saline. All wounds were injected with 0.5% Sensorcaine. The supraumbilical fascia as well as suprapubic fascia were reapproximated using 0 Vicryl interrupted sutures. All skin incisions were closed using staples. Betadine ointment and dry sterile dressings were applied.  All tape and needle counts were correct at the end of the procedure. The patient was extubated in the operating room and was transferred to PACU in stable condition.  Complications:  None  EBL:  Minimal  Specimen:  Appendix  Drains: JP drain to right lower quadrant

## 2013-09-10 NOTE — Anesthesia Postprocedure Evaluation (Signed)
  Anesthesia Post-op Note  Patient: Connie Farrell  Procedure(s) Performed: Procedure(s): APPENDECTOMY LAPAROSCOPIC (N/A)  Patient Location: PACU  Anesthesia Type:General  Level of Consciousness: awake, alert  and oriented  Airway and Oxygen Therapy: Patient Spontanous Breathing and Patient connected to face mask oxygen  Post-op Pain: mild  Post-op Assessment: Post-op Vital signs reviewed, Patient's Cardiovascular Status Stable, Respiratory Function Stable, Patent Airway and No signs of Nausea or vomiting  Post-op Vital Signs: Reviewed and stable  Complications: No apparent anesthesia complications

## 2013-09-10 NOTE — ED Provider Notes (Signed)
CSN: [id number]     Arrival date & time 09/10/13  1442 History  .This chart was scribed for Sharyon Cable, MD by Era Bumpers, ED scribe. This patient was seen in room APA12/APA12 and the patient's care was started at 3:03 PM.    Chief Complaint - abdominal pain   The history is provided by the patient. No language interpreter was used.   HPI Comments: Connie Farrell is a 53 y.o. female with a h/o PAD who presents to the Emergency Department complaining of constant gradually worsening RLQ abdominal pain that began 3 days ago. She states this began as a sternal chest pain and that she noticed swelling which has since resolved. She reports associated emesis episodes, diarrhea, fever, nausea. She reports drinking Mountain Dew approximately 30 minutes ago.   No active CP/SOB at this time  Past Medical History  Diagnosis Date  . Hypertension   . Seasonal allergies   . Neck pain, chronic   . Hyperlipemia   . PAD (peripheral artery disease)   . Exertional dyspnea   . GERD (gastroesophageal reflux disease)   . Arthritis     "hands; spine too" (04/04/2012)  . Anxiety   . Tobacco abuse    Past Surgical History  Procedure Laterality Date  . Anterior cervical decomp/discectomy fusion  ~ 04/2006    C6, 7, T1  . Tubal ligation  1990  . Peripheral arterial stent graft  04/04/2012   Family History  Problem Relation Age of Onset  . Heart disease Other   . Arthritis    . Asthma     History  Substance Use Topics  . Smoking status: Former Smoker -- 1.00 packs/day for 36 years    Types: Cigarettes  . Smokeless tobacco: Never Used  . Alcohol Use: No   OB History   Grav Para Term Preterm Abortions TAB SAB Ect Mult Living   2 2 2       2      Review of Systems  Constitutional: Positive for fever. Negative for chills.  Respiratory: Negative for cough and shortness of breath.   Cardiovascular: Negative for chest pain.  Gastrointestinal: Positive for nausea, abdominal pain and diarrhea.   Musculoskeletal: Negative for back pain.  All other systems reviewed and are negative.      Allergies  Codeine  Home Medications   Current Outpatient Rx  Name  Route  Sig  Dispense  Refill  . acetaminophen (TYLENOL) 650 MG CR tablet   Oral   Take 650 mg by mouth every 8 (eight) hours as needed for pain.         Marland Kitchen aspirin EC 81 MG tablet   Oral   Take 81 mg by mouth daily.         Marland Kitchen atorvastatin (LIPITOR) 40 MG tablet   Oral   Take 40 mg by mouth daily.          . clopidogrel (PLAVIX) 75 MG tablet   Oral   Take 1 tablet (75 mg total) by mouth daily with breakfast.   30 tablet   11   . cyclobenzaprine (FLEXERIL) 10 MG tablet   Oral   Take 5-10 mg by mouth 2 (two) times daily as needed. For pain         . diphenhydrAMINE (BENADRYL) 25 MG tablet   Oral   Take 25 mg by mouth every 6 (six) hours as needed.         Marland Kitchen escitalopram (LEXAPRO) 10  MG tablet   Oral   Take 10 mg by mouth Daily.         Marland Kitchen lisinopril-hydrochlorothiazide (PRINZIDE,ZESTORETIC) 20-12.5 MG per tablet   Oral   Take 1 tablet by mouth daily.          . naproxen sodium (ANAPROX) 220 MG tablet   Oral   Take 220 mg by mouth 2 (two) times daily with a meal.         . pantoprazole (PROTONIX) 40 MG tablet   Oral   Take 1 tablet (40 mg total) by mouth 2 (two) times daily.   60 tablet   5   . traMADol (ULTRAM) 50 MG tablet   Oral   Take 50 mg by mouth every 6 (six) hours as needed. For pain          BP 94/69  Pulse 113  Temp(Src) 97.9 F (36.6 C) (Oral)  Resp 20  Ht 5\' 5"  (1.651 m)  Wt 135 lb (61.236 kg)  BMI 22.47 kg/m2  SpO2 99%  Physical Exam CONSTITUTIONAL: Well developed/well nourished HEAD: Normocephalic/atraumatic EYES: EOMI/PERRL ENMT: Mucous membranes dry NECK: supple no meningeal signs SPINE:entire spine nontender CV: S1/S2 noted, no murmurs/rubs/gallops noted LUNGS: Lungs are clear to auscultation bilaterally, no apparent distress ABDOMEN: soft,  moderate RLQ tenderness, no rebound  NEURO: Pt is awake/alert, moves all extremitiesx4 EXTREMITIES: pulses normal, full ROM SKIN: warm, color normal PSYCH: no abnormalities of mood noted  ED Course  Procedures    COORDINATION OF CARE: At 3:05 PM Discussed treatment plan with patient which includes consult with surgeon. Patient agrees.  D/w dr Arnoldo Morale.  Surgery will need to be delayed due to recent PO intake.  He requests I keep patient NPO and start zosyn.  We discussed CT findings.    4:19 PM Pt updated on plan She is currently stable  Labs Review Labs Reviewed  COMPREHENSIVE METABOLIC PANEL - Abnormal; Notable for the following:    Sodium 131 (*)    Potassium 3.4 (*)    Chloride 90 (*)    Glucose, Bld 112 (*)    BUN 27 (*)    Creatinine, Ser 1.62 (*)    Albumin 3.4 (*)    GFR calc non Af Amer 36 (*)    GFR calc Af Amer 41 (*)    All other components within normal limits  CBC WITH DIFFERENTIAL - Abnormal; Notable for the following:    WBC 13.2 (*)    Neutrophils Relative % 91 (*)    Neutro Abs 12.0 (*)    Lymphocytes Relative 4 (*)    Lymphs Abs 0.6 (*)    All other components within normal limits  LIPASE, BLOOD  PROTIME-INR  APTT  URINALYSIS, ROUTINE W REFLEX MICROSCOPIC  TYPE AND SCREEN   Imaging Review Ct Abdomen Pelvis W Contrast  09/10/2013   CLINICAL DATA:  Lower abdominal pain for the preceding 3 days  EXAM: CT ABDOMEN AND PELVIS WITH CONTRAST  TECHNIQUE: Multidetector CT imaging of the abdomen and pelvis was performed using the standard protocol following bolus administration of intravenous contrast.  CONTRAST:  145mL OMNIPAQUE IOHEXOL 300 MG/ML SOLN intravenously, the patient also received oral contrast material.  COMPARISON:  None.  FINDINGS: The appendix is edematous and there is surrounding inflammatory change. It is visualized on images 51 through 68. The adjacent cecum demonstrates considerable fluid including contrast. Proximal to this the small bowel  exhibits minimal distention but no obstructive findings. The ascending and transverse and descending  portions of the colon exhibit no acute abnormalities. A small amount of periappendiceal fluid is suspected on images 53-56, but no significant free extraluminal fluid or gas collections are demonstrated elsewhere.  The liver, gallbladder, pancreas, spleen, partially distended stomach, and kidneys are normal in appearance. The adrenal glands are mildly enlarged bilaterally. The caliber of the abdominal aorta is normal. There is a left common iliac artery stent graft in place. There is no periaortic or pericaval lymphadenopathy. The uterus and adnexal structures exhibit no acute abnormalities. The partially distended urinary bladder is normal in appearance. There is a small fat containing left inguinal hernia. There is no significant umbilical hernia.  The lumbar vertebral bodies are preserved in height. There is disc space narrowing at L4-5. The observed portions of the bony pelvis appear normal.  IMPRESSION: 1. The findings are consistent with acute appendicitis. There is a small amount of pericholecystic fluid versus a tiny abscess developing on images 53 through 56. No extraluminal gas collections are demonstrated. The bowel otherwise exhibits no acute abnormality. 2. There is no evidence of acute hepatobiliary or urinary tract abnormality. 3. There is bilateral adrenal enlargement. The adrenal glands were not included in the field of view on a chest CT scan dated Dec 02, 2011. When the patient has recovered from her acute illness, follow-up adrenal MRI would be useful. 4. These results were called by telephone at the time of interpretation on 09/10/2013 at 1:30 PM to Gaetana Michaelis, RT, in the radiology department at [facility] where the patient is waiting. [name] is going to contact Dr. Nevada Crane for referral to the ED.   Electronically Signed   By: David  Martinique   On: 09/10/2013 13:33   Dg Chest  Portable 1 View  09/10/2013   CLINICAL DATA:  Pain  EXAM: PORTABLE CHEST - 1 VIEW  COMPARISON:  None.  FINDINGS: The heart size and mediastinal contours are within normal limits. Both lungs are clear. The visualized skeletal structures are unremarkable.  IMPRESSION: No active disease.   Electronically Signed   By: Margaree Mackintosh M.D.   On: 09/10/2013 15:40      MDM   Final diagnoses:  Acute appendicitis  Dehydration    Nursing notes including past medical history and social history reviewed and considered in documentation Labs/vital reviewed and considered xrays reviewed and considered   I personally performed the services described in this documentation, which was scribed in my presence. The recorded information has been reviewed and is accurate.      Date: 09/10/2013  Rate: 104  Rhythm: sinus tachycardia  QRS Axis: normal  Intervals: normal  ST/T Wave abnormalities: nonspecific ST changes  Conduction Disutrbances:none     Sharyon Cable, MD 09/10/13 [phone]

## 2013-09-10 NOTE — ED Notes (Signed)
Pt was here earlier for CT of the abdomen. Pt was expected in the ED at that time but never showed. Pt here now states Dr. Arnoldo Morale was on his way to evaluate her for acute appendicitis.

## 2013-09-11 ENCOUNTER — Other Ambulatory Visit (HOSPITAL_COMMUNITY): Payer: Self-pay | Admitting: Cardiovascular Disease

## 2013-09-11 ENCOUNTER — Telehealth (HOSPITAL_COMMUNITY): Payer: Self-pay | Admitting: *Deleted

## 2013-09-11 ENCOUNTER — Encounter (HOSPITAL_COMMUNITY): Payer: Self-pay | Admitting: General Surgery

## 2013-09-11 DIAGNOSIS — I701 Atherosclerosis of renal artery: Secondary | ICD-10-CM

## 2013-09-11 LAB — BASIC METABOLIC PANEL
BUN: 26 mg/dL — ABNORMAL HIGH (ref 6–23)
CALCIUM: 8.2 mg/dL — AB (ref 8.4–10.5)
CO2: 24 meq/L (ref 19–32)
Chloride: 97 mEq/L (ref 96–112)
Creatinine, Ser: 1.46 mg/dL — ABNORMAL HIGH (ref 0.50–1.10)
GFR calc Af Amer: 47 mL/min — ABNORMAL LOW (ref >90)
GFR calc non Af Amer: 41 mL/min — ABNORMAL LOW (ref >90)
Glucose, Bld: 107 mg/dL — ABNORMAL HIGH (ref 70–99)
Potassium: 3.4 mEq/L — ABNORMAL LOW (ref 3.7–5.3)
SODIUM: 135 meq/L — AB (ref 137–147)

## 2013-09-11 LAB — CBC
HCT: 35.4 % — ABNORMAL LOW (ref 36.0–46.0)
HEMOGLOBIN: 11.8 g/dL — AB (ref 12.0–15.0)
MCH: 32.7 pg (ref 26.0–34.0)
MCHC: 33.3 g/dL (ref 30.0–36.0)
MCV: 98.1 fL (ref 78.0–100.0)
Platelets: 165 10*3/uL (ref 150–400)
RBC: 3.61 MIL/uL — ABNORMAL LOW (ref 3.87–5.11)
RDW: 13.9 % (ref 11.5–15.5)
WBC: 6 10*3/uL (ref 4.0–10.5)

## 2013-09-11 LAB — MAGNESIUM: MAGNESIUM: 2.1 mg/dL (ref 1.5–2.5)

## 2013-09-11 LAB — PHOSPHORUS: Phosphorus: 4.5 mg/dL (ref 2.3–4.6)

## 2013-09-11 MED ORDER — SODIUM CHLORIDE 0.9 % IV BOLUS (SEPSIS)
500.0000 mL | Freq: Once | INTRAVENOUS | Status: AC
  Administered 2013-09-11: 500 mL via INTRAVENOUS

## 2013-09-11 MED ORDER — ALUM & MAG HYDROXIDE-SIMETH 200-200-20 MG/5ML PO SUSP: 15.0000 mL | ORAL | Status: DC | PRN

## 2013-09-11 MED ORDER — KCL IN DEXTROSE-NACL 20-5-0.45 MEQ/L-%-% IV SOLN
INTRAVENOUS | Status: DC
  Administered 2013-09-11 – 2013-09-12 (×2): via INTRAVENOUS

## 2013-09-11 MED ORDER — PIPERACILLIN-TAZOBACTAM 3.375 G IVPB
INTRAVENOUS | Status: AC
  Filled 2013-09-11: qty 100

## 2013-09-11 NOTE — Addendum Note (Signed)
Addendum created 09/11/13 1041 by Mickel Baas, CRNA   Modules edited: Notes Section   Notes Section:  File: [id number]

## 2013-09-11 NOTE — Anesthesia Postprocedure Evaluation (Signed)
  Anesthesia Post-op Note  Patient: Connie Farrell  Procedure(s) Performed: Procedure(s): APPENDECTOMY LAPAROSCOPIC (N/A)  Patient Location: PACU  Anesthesia Type:General  Level of Consciousness: awake, alert , oriented and patient cooperative  Airway and Oxygen Therapy: Patient Spontanous Breathing  Post-op Pain: moderate  Post-op Assessment: Post-op Vital signs reviewed, Patient's Cardiovascular Status Stable, Respiratory Function Stable, Patent Airway, No signs of Nausea or vomiting and Pain level controlled  Post-op Vital Signs: Reviewed and stable  Complications: No apparent anesthesia complications

## 2013-09-11 NOTE — Progress Notes (Signed)
Pt BP is improving but still low. Will hold BP meds this Am. MD is aware, will continue to monitor.

## 2013-09-11 NOTE — Progress Notes (Signed)
1 Day Post-Op  Subjective: Comfortable. Pain medication keeping her incisional pain under control.  Objective: Vital signs in last 24 hours: Temp:  [97.3 F (36.3 C)-99.2 F (37.3 C)] 97.3 F (36.3 C) (02/24 0508) Pulse Rate:  [78-116] 93 (02/24 0508) Resp:  [18-24] 20 (02/24 0508) BP: (82-100)/(49-78) 97/59 mmHg (02/24 0759) SpO2:  [91 %-100 %] 93 % (02/24 0508) Weight:  [60.328 kg (133 lb)-61.236 kg (135 lb)] 60.328 kg (133 lb) (02/23 1727) Last BM Date: 09/10/13  Intake/Output from previous day: 02/23 0701 - 02/24 0700 In: 900 [I.V.:900] Out: 260 [Urine:100; Drains:160] Intake/Output this shift:    General appearance: alert, cooperative and no distress Resp: clear to auscultation bilaterally Cardio: regular rate and rhythm, S1, S2 normal, no murmur, click, rub or gallop GI: Soft. Dressings intact. JP drainage serous in nature.  Lab Results:   Recent Labs  09/10/13 1519 09/11/13 0509  WBC 13.2* 6.0  HGB 12.8 11.8*  HCT 38.3 35.4*  PLT 193 165   BMET  Recent Labs  09/10/13 1519 09/11/13 0509  NA 131* 135*  K 3.4* 3.4*  CL 90* 97  CO2 26 24  GLUCOSE 112* 107*  BUN 27* 26*  CREATININE 1.62* 1.46*  CALCIUM 9.4 8.2*   PT/INR  Recent Labs  09/10/13 1519  LABPROT 13.7  INR 1.07    Studies/Results: Ct Abdomen Pelvis W Contrast  09/10/2013   CLINICAL DATA:  Lower abdominal pain for the preceding 3 days  EXAM: CT ABDOMEN AND PELVIS WITH CONTRAST  TECHNIQUE: Multidetector CT imaging of the abdomen and pelvis was performed using the standard protocol following bolus administration of intravenous contrast.  CONTRAST:  132mL OMNIPAQUE IOHEXOL 300 MG/ML SOLN intravenously, the patient also received oral contrast material.  COMPARISON:  None.  FINDINGS: The appendix is edematous and there is surrounding inflammatory change. It is visualized on images 51 through 68. The adjacent cecum demonstrates considerable fluid including contrast. Proximal to this the small  bowel exhibits minimal distention but no obstructive findings. The ascending and transverse and descending portions of the colon exhibit no acute abnormalities. A small amount of periappendiceal fluid is suspected on images 53-56, but no significant free extraluminal fluid or gas collections are demonstrated elsewhere.  The liver, gallbladder, pancreas, spleen, partially distended stomach, and kidneys are normal in appearance. The adrenal glands are mildly enlarged bilaterally. The caliber of the abdominal aorta is normal. There is a left common iliac artery stent graft in place. There is no periaortic or pericaval lymphadenopathy. The uterus and adnexal structures exhibit no acute abnormalities. The partially distended urinary bladder is normal in appearance. There is a small fat containing left inguinal hernia. There is no significant umbilical hernia.  The lumbar vertebral bodies are preserved in height. There is disc space narrowing at L4-5. The observed portions of the bony pelvis appear normal.  IMPRESSION: 1. The findings are consistent with acute appendicitis. There is a small amount of pericholecystic fluid versus a tiny abscess developing on images 53 through 56. No extraluminal gas collections are demonstrated. The bowel otherwise exhibits no acute abnormality. 2. There is no evidence of acute hepatobiliary or urinary tract abnormality. 3. There is bilateral adrenal enlargement. The adrenal glands were not included in the field of view on a chest CT scan dated Dec 02, 2011. When the patient has recovered from her acute illness, follow-up adrenal MRI would be useful. 4. These results were called by telephone at the time of interpretation on 09/10/2013 at 1:30 PM to  Gaetana Michaelis, RT, in the radiology department at [facility] where the patient is waiting. [name] is going to contact Dr. Nevada Crane for referral to the ED.   Electronically Signed   By: David  Martinique   On: 09/10/2013 13:33   Dg Chest  Portable 1 View  09/10/2013   CLINICAL DATA:  Pain  EXAM: PORTABLE CHEST - 1 VIEW  COMPARISON:  None.  FINDINGS: The heart size and mediastinal contours are within normal limits. Both lungs are clear. The visualized skeletal structures are unremarkable.  IMPRESSION: No active disease.   Electronically Signed   By: Margaree Mackintosh M.D.   On: 09/10/2013 15:40    Anti-infectives: Anti-infectives   Start     Dose/Rate Route Frequency Ordered Stop   09/10/13 2330  piperacillin-tazobactam (ZOSYN) IVPB 3.375 g     3.375 g 12.5 mL/hr over 240 Minutes Intravenous Every 8 hours 09/10/13 2326     09/10/13 1515  piperacillin-tazobactam (ZOSYN) IVPB 3.375 g     3.375 g 100 mL/hr over 30 Minutes Intravenous  Once 09/10/13 1511 09/10/13 1612      Assessment/Plan: s/p Procedure(s): APPENDECTOMY LAPAROSCOPIC Impression: Stable, postoperative day 1, status post laparoscopic appendectomy for perforated appendicitis. Plan: Will hold blood pressure medication. Patient states that her blood pressure usually runs low. Will advance diet as tolerated. Continue IV Zosyn.  LOS: 1 day    Elmer Boutelle A 09/11/2013

## 2013-09-11 NOTE — Progress Notes (Signed)
UR chart review completed.  

## 2013-09-11 NOTE — Progress Notes (Signed)
Patients BP has been running low since surgery, paged on-call physician. A fluid bolus was ordered, will continue to monitor the patient.

## 2013-09-12 LAB — CBC
HCT: 37.4 % (ref 36.0–46.0)
Hemoglobin: 12.7 g/dL (ref 12.0–15.0)
MCH: 33.2 pg (ref 26.0–34.0)
MCHC: 34 g/dL (ref 30.0–36.0)
MCV: 97.7 fL (ref 78.0–100.0)
PLATELETS: 201 10*3/uL (ref 150–400)
RBC: 3.83 MIL/uL — ABNORMAL LOW (ref 3.87–5.11)
RDW: 13.9 % (ref 11.5–15.5)
WBC: 10.3 10*3/uL (ref 4.0–10.5)

## 2013-09-12 LAB — BASIC METABOLIC PANEL
BUN: 16 mg/dL (ref 6–23)
CO2: 25 mEq/L (ref 19–32)
CREATININE: 1.16 mg/dL — AB (ref 0.50–1.10)
Calcium: 8.9 mg/dL (ref 8.4–10.5)
Chloride: 95 mEq/L — ABNORMAL LOW (ref 96–112)
GFR, EST AFRICAN AMERICAN: 62 mL/min — AB (ref >90)
GFR, EST NON AFRICAN AMERICAN: 54 mL/min — AB (ref >90)
Glucose, Bld: 131 mg/dL — ABNORMAL HIGH (ref 70–99)
Potassium: 3.5 mEq/L — ABNORMAL LOW (ref 3.7–5.3)
Sodium: 133 mEq/L — ABNORMAL LOW (ref 137–147)

## 2013-09-12 MED ORDER — BISACODYL 10 MG RE SUPP
10.0000 mg | Freq: Once | RECTAL | Status: AC
  Administered 2013-09-12: 10 mg via RECTAL

## 2013-09-12 MED ORDER — MAGNESIUM HYDROXIDE 400 MG/5ML PO SUSP
30.0000 mL | Freq: Two times a day (BID) | ORAL | Status: DC
  Administered 2013-09-12: 30 mL via ORAL
  Filled 2013-09-12 (×2): qty 30

## 2013-09-12 NOTE — Progress Notes (Signed)
2 Days Post-Op  Subjective: Some nausea. No vomiting noted. Has not had a bowel movement since surgery.  Objective: Vital signs in last 24 hours: Temp:  [97.6 F (36.4 C)-98.1 F (36.7 C)] 98.1 F (36.7 C) (02/25 0501) Pulse Rate:  [83-109] 109 (02/25 0836) Resp:  [19-20] 20 (02/25 0501) BP: (98-123)/(65-81) 112/81 mmHg (02/25 0836) SpO2:  [97 %-98 %] 97 % (02/25 0501) Last BM Date: 09/11/13  Intake/Output from previous day: 02/24 0701 - 02/25 0700 In: 548.3 [I.V.:548.3] Out: 30 [Drains:30] Intake/Output this shift:    General appearance: alert, cooperative and no distress Resp: clear to auscultation bilaterally Cardio: regular rate and rhythm, S1, S2 normal, no murmur, click, rub or gallop GI: Soft. Incisions healing well. No bowel sounds appreciated.      JP drainage serous in nature. Lab Results:   Recent Labs  09/11/13 0509 09/12/13 0540  WBC 6.0 10.3  HGB 11.8* 12.7  HCT 35.4* 37.4  PLT 165 201   BMET  Recent Labs  09/11/13 0509 09/12/13 0540  NA 135* 133*  K 3.4* 3.5*  CL 97 95*  CO2 24 25  GLUCOSE 107* 131*  BUN 26* 16  CREATININE 1.46* 1.16*  CALCIUM 8.2* 8.9   PT/INR  Recent Labs  09/10/13 1519  LABPROT 13.7  INR 1.07    Studies/Results: Ct Abdomen Pelvis W Contrast  09/10/2013   CLINICAL DATA:  Lower abdominal pain for the preceding 3 days  EXAM: CT ABDOMEN AND PELVIS WITH CONTRAST  TECHNIQUE: Multidetector CT imaging of the abdomen and pelvis was performed using the standard protocol following bolus administration of intravenous contrast.  CONTRAST:  120mL OMNIPAQUE IOHEXOL 300 MG/ML SOLN intravenously, the patient also received oral contrast material.  COMPARISON:  None.  FINDINGS: The appendix is edematous and there is surrounding inflammatory change. It is visualized on images 51 through 68. The adjacent cecum demonstrates considerable fluid including contrast. Proximal to this the small bowel exhibits minimal distention but no  obstructive findings. The ascending and transverse and descending portions of the colon exhibit no acute abnormalities. A small amount of periappendiceal fluid is suspected on images 53-56, but no significant free extraluminal fluid or gas collections are demonstrated elsewhere.  The liver, gallbladder, pancreas, spleen, partially distended stomach, and kidneys are normal in appearance. The adrenal glands are mildly enlarged bilaterally. The caliber of the abdominal aorta is normal. There is a left common iliac artery stent graft in place. There is no periaortic or pericaval lymphadenopathy. The uterus and adnexal structures exhibit no acute abnormalities. The partially distended urinary bladder is normal in appearance. There is a small fat containing left inguinal hernia. There is no significant umbilical hernia.  The lumbar vertebral bodies are preserved in height. There is disc space narrowing at L4-5. The observed portions of the bony pelvis appear normal.  IMPRESSION: 1. The findings are consistent with acute appendicitis. There is a small amount of pericholecystic fluid versus a tiny abscess developing on images 53 through 56. No extraluminal gas collections are demonstrated. The bowel otherwise exhibits no acute abnormality. 2. There is no evidence of acute hepatobiliary or urinary tract abnormality. 3. There is bilateral adrenal enlargement. The adrenal glands were not included in the field of view on a chest CT scan dated Dec 02, 2011. When the patient has recovered from her acute illness, follow-up adrenal MRI would be useful. 4. These results were called by telephone at the time of interpretation on 09/10/2013 at 1:30 PM to Connie Farrell,  in the radiology department at [facility] where the patient is waiting. Connie Farrell is going to contact Connie Farrell for referral to the ED.   Electronically Signed   By: Connie  Farrell   On: 09/10/2013 13:33   Dg Chest Portable 1 View  09/10/2013   CLINICAL  DATA:  Pain  EXAM: PORTABLE CHEST - 1 VIEW  COMPARISON:  None.  FINDINGS: The heart size and mediastinal contours are within normal limits. Both lungs are clear. The visualized skeletal structures are unremarkable.  IMPRESSION: No active disease.   Electronically Signed   By: Connie Farrell M.D.   On: 09/10/2013 15:40    Anti-infectives: Anti-infectives   Start     Dose/Rate Route Frequency Ordered Stop   09/10/13 2330  piperacillin-tazobactam (ZOSYN) IVPB 3.375 g     3.375 g 12.5 mL/hr over 240 Minutes Intravenous Every 8 hours 09/10/13 2326     09/10/13 1515  piperacillin-tazobactam (ZOSYN) IVPB 3.375 g     3.375 g 100 mL/hr over 30 Minutes Intravenous  Once 09/10/13 1511 09/10/13 1612      Assessment/Plan: s/p Procedure(s): APPENDECTOMY LAPAROSCOPIC Impression: Stable, postoperative day 2, status post perforated appendicitis. The patient has not had return of bowel function yet. We'll give cathartics. Her renal insufficiency has resolved.  LOS: 2 days    Connie Farrell A 09/12/2013

## 2013-09-12 NOTE — Progress Notes (Signed)
Pt had one episode of emesis. RN did not see episode but pt reports that it was a small amount of yellow liquid. Will continue to monitor.

## 2013-09-13 ENCOUNTER — Encounter (HOSPITAL_COMMUNITY): Payer: Managed Care, Other (non HMO)

## 2013-09-13 LAB — BASIC METABOLIC PANEL
BUN: 15 mg/dL (ref 6–23)
CO2: 26 mEq/L (ref 19–32)
Calcium: 9.4 mg/dL (ref 8.4–10.5)
Chloride: 88 mEq/L — ABNORMAL LOW (ref 96–112)
Creatinine, Ser: 1.15 mg/dL — ABNORMAL HIGH (ref 0.50–1.10)
GFR calc Af Amer: 63 mL/min — ABNORMAL LOW (ref >90)
GFR, EST NON AFRICAN AMERICAN: 54 mL/min — AB (ref >90)
Glucose, Bld: 127 mg/dL — ABNORMAL HIGH (ref 70–99)
Potassium: 3.8 mEq/L (ref 3.7–5.3)
SODIUM: 129 meq/L — AB (ref 137–147)

## 2013-09-13 LAB — CBC
HCT: 42.5 % (ref 36.0–46.0)
HEMOGLOBIN: 14.5 g/dL (ref 12.0–15.0)
MCH: 33 pg (ref 26.0–34.0)
MCHC: 34.1 g/dL (ref 30.0–36.0)
MCV: 96.8 fL (ref 78.0–100.0)
Platelets: 234 10*3/uL (ref 150–400)
RBC: 4.39 MIL/uL (ref 3.87–5.11)
RDW: 13.9 % (ref 11.5–15.5)
WBC: 12.6 10*3/uL — ABNORMAL HIGH (ref 4.0–10.5)

## 2013-09-13 MED ORDER — KCL IN DEXTROSE-NACL 20-5-0.9 MEQ/L-%-% IV SOLN
INTRAVENOUS | Status: DC
  Administered 2013-09-13: 10:00:00 via INTRAVENOUS

## 2013-09-13 MED ORDER — METOCLOPRAMIDE HCL 5 MG/ML IJ SOLN
10.0000 mg | Freq: Four times a day (QID) | INTRAMUSCULAR | Status: DC
  Administered 2013-09-13 – 2013-09-14 (×4): 10 mg via INTRAVENOUS
  Filled 2013-09-13 (×4): qty 2

## 2013-09-13 NOTE — Progress Notes (Signed)
3 Days Post-Op  Subjective: Had a small emesis over the past 24 hours. She states she has had 2 bowel movements that were loose in nature over the past 24 hours. She still feels somewhat fatigued. She is ambulating within the room.  Objective: Vital signs in last 24 hours: Temp:  [97.7 F (36.5 C)-98.1 F (36.7 C)] 97.9 F (36.6 C) (02/26 0428) Pulse Rate:  [99-110] 101 (02/26 0428) Resp:  [18] 18 (02/26 0428) BP: (100-134)/(71-82) 108/76 mmHg (02/26 0801) SpO2:  [92 %-97 %] 92 % (02/26 0428) Last BM Date: 09/12/13  Intake/Output from previous day: 02/25 0701 - 02/26 0700 In: 4741.3 [P.O.:1160; I.V.:3231.3; IV Piggyback:350] Out: 260 [Urine:250; Drains:10] Intake/Output this shift:    General appearance: alert, cooperative and no distress GI: Soft, but still distended. Not tense. Incisions healing well. JP drainage serous in nature and decreasing. Drainage not purulent in nature.  Lab Results:   Recent Labs  09/12/13 0540 09/13/13 0524  WBC 10.3 12.6*  HGB 12.7 14.5  HCT 37.4 42.5  PLT 201 234   BMET  Recent Labs  09/12/13 0540 09/13/13 0524  NA 133* 129*  K 3.5* 3.8  CL 95* 88*  CO2 25 26  GLUCOSE 131* 127*  BUN 16 15  CREATININE 1.16* 1.15*  CALCIUM 8.9 9.4   PT/INR  Recent Labs  09/10/13 1519  LABPROT 13.7  INR 1.07    Studies/Results: No results found.  Anti-infectives: Anti-infectives   Start     Dose/Rate Route Frequency Ordered Stop   09/10/13 2330  piperacillin-tazobactam (ZOSYN) IVPB 3.375 g     3.375 g 12.5 mL/hr over 240 Minutes Intravenous Every 8 hours 09/10/13 2326     09/10/13 1515  piperacillin-tazobactam (ZOSYN) IVPB 3.375 g     3.375 g 100 mL/hr over 30 Minutes Intravenous  Once 09/10/13 1511 09/10/13 1612      Assessment/Plan: s/p Procedure(s): APPENDECTOMY LAPAROSCOPIC Impression: Postoperative day 3, status post laparoscopic appendectomy for perforated appendicitis with associated peritonitis. She needs to be  continued on Zosyn due to the peritonitis. She is hyponatremic and this will be treated with adjustment of her IV fluids. She does have an ileus secondary to the peritonitis. Will add Reglan for the next 24-48 hours to help with return of bowel function.  LOS: 3 days    Coralie Stanke A 09/13/2013

## 2013-09-14 LAB — CBC
HCT: 35.8 % — ABNORMAL LOW (ref 36.0–46.0)
Hemoglobin: 12 g/dL (ref 12.0–15.0)
MCH: 32.6 pg (ref 26.0–34.0)
MCHC: 33.5 g/dL (ref 30.0–36.0)
MCV: 97.3 fL (ref 78.0–100.0)
Platelets: 245 10*3/uL (ref 150–400)
RBC: 3.68 MIL/uL — AB (ref 3.87–5.11)
RDW: 14.1 % (ref 11.5–15.5)
WBC: 7.8 10*3/uL (ref 4.0–10.5)

## 2013-09-14 LAB — BASIC METABOLIC PANEL
BUN: 15 mg/dL (ref 6–23)
CO2: 25 meq/L (ref 19–32)
Calcium: 9 mg/dL (ref 8.4–10.5)
Chloride: 90 mEq/L — ABNORMAL LOW (ref 96–112)
Creatinine, Ser: 1.19 mg/dL — ABNORMAL HIGH (ref 0.50–1.10)
GFR calc Af Amer: 60 mL/min — ABNORMAL LOW (ref >90)
GFR calc non Af Amer: 52 mL/min — ABNORMAL LOW (ref >90)
Glucose, Bld: 112 mg/dL — ABNORMAL HIGH (ref 70–99)
Potassium: 4 mEq/L (ref 3.7–5.3)
Sodium: 130 mEq/L — ABNORMAL LOW (ref 137–147)

## 2013-09-14 MED ORDER — OXYCODONE-ACETAMINOPHEN 7.5-325 MG PO TABS: 1.0000 | ORAL_TABLET | ORAL | Status: DC | PRN

## 2013-09-14 MED ORDER — AMOXICILLIN-POT CLAVULANATE 875-125 MG PO TABS: 1.0000 | ORAL_TABLET | Freq: Two times a day (BID) | ORAL | Status: DC

## 2013-09-14 MED ORDER — ONDANSETRON HCL 4 MG PO TABS: 4.0000 mg | ORAL_TABLET | Freq: Three times a day (TID) | ORAL | Status: DC | PRN

## 2013-09-14 NOTE — Discharge Summary (Signed)
Physician Discharge Summary  Patient ID: Connie Farrell MRN: [id number] DOB/AGE: 08/11/1960 53 y.o.  Admit date: 09/10/2013 Discharge date: 09/14/2013  Admission Diagnoses: Acute appendicitis with perforation, peritonitis  Discharge Diagnoses: Same Active Problems:   Acute appendicitis   Perforated appendicitis   Discharged Condition: good  Hospital Course: Patient is a 53 year old white female who presented emergency room after undergoing an outpatient CAT scan which revealed acute appendicitis with possible perforation. She went to the operating room on 09/10/2013 and underwent laparoscopic appendectomy. A drain had to be placed as she did indeed have an abscess secondary to perforation. She tolerated the procedure well. Postoperative course was remarkable for a mild ileus which has since resolved. Her leukocytosis has resolved. She had mild hyponatremia which is resolving. She is being discharged home in good improving condition. Her JP drain has been removed.  Treatments: surgery: Laparoscopic appendectomy on 09/10/2013  Discharge Exam: Blood pressure 112/73, pulse 94, temperature 98 F (36.7 C), temperature source Oral, resp. rate 20, height 5\' 5"  (1.651 m), weight 60.328 kg (133 lb), SpO2 93.00%. General appearance: alert, cooperative and no distress Resp: clear to auscultation bilaterally Cardio: regular rate and rhythm, S1, S2 normal, no murmur, click, rub or gallop GI: Soft. Wounds healing well. Less distention noted.  Disposition: 01-Home or Self Care   Future Appointments Provider Department Dept Phone   09/27/2013 9:00 AM [facility] [place] [facility] [address] [phone]       Medication List         acetaminophen 650 MG CR tablet  Commonly known as:  TYLENOL  Take 650 mg by mouth every 8 (eight) hours as needed for pain.     amoxicillin-clavulanate 875-125 MG per tablet  Commonly known as:  AUGMENTIN  Take 1 tablet by mouth 2 (two)  times daily.     aspirin EC 81 MG tablet  Take 81 mg by mouth daily.     atorvastatin 40 MG tablet  Commonly known as:  LIPITOR  Take 40 mg by mouth daily.     clopidogrel 75 MG tablet  Commonly known as:  PLAVIX  Take 1 tablet (75 mg total) by mouth daily with breakfast.     cyclobenzaprine 10 MG tablet  Commonly known as:  FLEXERIL  Take 5-10 mg by mouth 2 (two) times daily as needed. For pain     diphenhydrAMINE 25 MG tablet  Commonly known as:  BENADRYL  Take 25 mg by mouth every 6 (six) hours as needed.     escitalopram 10 MG tablet  Commonly known as:  LEXAPRO  Take 10 mg by mouth Daily.     lisinopril-hydrochlorothiazide 20-12.5 MG per tablet  Commonly known as:  PRINZIDE,ZESTORETIC  Take 1 tablet by mouth daily.     naproxen sodium 220 MG tablet  Commonly known as:  ANAPROX  Take 220 mg by mouth 2 (two) times daily with a meal.     ondansetron 4 MG tablet  Commonly known as:  ZOFRAN  Take 1 tablet (4 mg total) by mouth every 8 (eight) hours as needed for nausea or vomiting.     oxyCODONE-acetaminophen 7.5-325 MG per tablet  Commonly known as:  PERCOCET  Take 1-2 tablets by mouth every 4 (four) hours as needed.     pantoprazole 40 MG tablet  Commonly known as:  PROTONIX  Take 1 tablet (40 mg total) by mouth 2 (two) times daily.     traMADol 50 MG tablet  Commonly known  as:  ULTRAM  Take 50 mg by mouth every 6 (six) hours as needed. For pain           Follow-up Information   Follow up with Jamesetta So, MD. Schedule an appointment as soon as possible for a visit on 09/20/2013.   Specialty:  General Surgery   Contact information:   [address] [place] [address] [phone]       Signed: Aviva Signs A 09/14/2013, 8:42 AM

## 2013-09-14 NOTE — Progress Notes (Signed)
Patient discharged home with family.  IVs removed - WNL.  Follow up in place with Dr. Arnoldo Morale.  Instructed on new medications and importance of completing dose of antibiotics to prevent infection.  Also instructed on incisional care and cough/deep breathing exercises to prevent pneumonia.  Instructed on s/s of infection and when to call MD.  Rosezena Sensor understanding.  No questions at this time, left floor in NAD via [place] with RN.

## 2013-09-14 NOTE — Progress Notes (Signed)
UR chart review completed.  

## 2013-09-14 NOTE — Discharge Instructions (Signed)
Laparoscopic Appendectomy Care After Refer to this sheet in the next few weeks. These instructions provide you with information on caring for yourself after your procedure. Your caregiver may also give you more specific instructions. Your treatment has been planned according to current medical practices, but problems sometimes occur. Call your caregiver if you have any problems or questions after your procedure. HOME CARE INSTRUCTIONS  Do not drive while taking narcotic pain medicines.  Use stool softener if you become constipated from your pain medicines.  Change your bandages (dressings) as directed.  Keep your wounds clean and dry. You may wash the wounds gently with soap and water. Gently pat the wounds dry with a clean towel.  Do not take baths, swim, or use hot tubs for 10 days, or as instructed by your caregiver.  Only take over-the-counter or prescription medicines for pain, discomfort, or fever as directed by your caregiver.  You may continue your normal diet as directed.  Do not lift more than 10 pounds (4.5 kg) or play contact sports for 3 weeks, or as directed.  Slowly increase your activity after surgery.  Take deep breaths to avoid getting a lung infection (pneumonia). SEEK MEDICAL CARE IF:  You have redness, swelling, or increasing pain in your wounds.  You have pus coming from your wounds.  You have drainage from a wound that lasts longer than 1 day.  You notice a bad smell coming from the wounds or dressing.  Your wound edges break open after stitches (sutures) have been removed.  You notice increasing pain in the shoulders (shoulder strap areas) or near your shoulder blades.  You develop dizzy episodes or fainting while standing.  You develop shortness of breath.  You develop persistent nausea or vomiting.  You cannot control your bowel functions or lose your appetite.  You develop diarrhea. SEEK IMMEDIATE MEDICAL CARE IF:   You have a fever.  You  develop a rash.  You have difficulty breathing or sharp pains in your chest.  You develop any reaction or side effects to medicines given. MAKE SURE YOU:  Understand these instructions.  Will watch your condition.  Will get help right away if you are not doing well or get worse. Document Released: 07/05/2005 Document Revised: 09/27/2011 Document Reviewed: 01/12/2011 [facility] Patient Information 2014 [place], [place].

## 2013-09-18 ENCOUNTER — Encounter: Payer: Self-pay | Admitting: Cardiovascular Disease

## 2013-09-27 ENCOUNTER — Ambulatory Visit (HOSPITAL_COMMUNITY)
Admission: RE | Admit: 2013-09-27 | Discharge: 2013-09-27 | Disposition: A | Discharge status: Home / Self Care | Payer: Managed Care, Other (non HMO) | Source: Ambulatory Visit | Attending: Cardiovascular Disease | Admitting: Cardiovascular Disease

## 2013-09-27 DIAGNOSIS — I701 Atherosclerosis of renal artery: Secondary | ICD-10-CM | POA: Insufficient documentation

## 2013-09-27 DIAGNOSIS — I739 Peripheral vascular disease, unspecified: Secondary | ICD-10-CM

## 2013-09-27 DIAGNOSIS — I10 Essential (primary) hypertension: Secondary | ICD-10-CM

## 2013-09-27 NOTE — Progress Notes (Signed)
Renal Duplex Completed. Milik Gilreath, BS, RDMS, RVT  

## 2013-10-10 ENCOUNTER — Other Ambulatory Visit: Payer: Self-pay | Admitting: *Deleted

## 2013-10-10 MED ORDER — PANTOPRAZOLE SODIUM 40 MG PO TBEC: 40.0000 mg | DELAYED_RELEASE_TABLET | Freq: Two times a day (BID) | ORAL | Status: DC

## 2013-10-10 NOTE — Telephone Encounter (Signed)
Rx refill sent to Pts. pharmacy

## 2013-10-16 ENCOUNTER — Ambulatory Visit (INDEPENDENT_AMBULATORY_CARE_PROVIDER_SITE_OTHER): Payer: Managed Care, Other (non HMO) | Admitting: Cardiovascular Disease

## 2013-10-16 ENCOUNTER — Encounter: Payer: Self-pay | Admitting: Cardiovascular Disease

## 2013-10-16 VITALS — BP 100/60 | HR 83 | Ht 65.0 in | Wt 117.4 lb

## 2013-10-16 DIAGNOSIS — I701 Atherosclerosis of renal artery: Secondary | ICD-10-CM | POA: Insufficient documentation

## 2013-10-16 DIAGNOSIS — I10 Essential (primary) hypertension: Secondary | ICD-10-CM

## 2013-10-16 DIAGNOSIS — I739 Peripheral vascular disease, unspecified: Secondary | ICD-10-CM

## 2013-10-16 DIAGNOSIS — E785 Hyperlipidemia, unspecified: Secondary | ICD-10-CM

## 2013-10-16 NOTE — Assessment & Plan Note (Signed)
On statin therapy followed by her PCP 

## 2013-10-16 NOTE — Patient Instructions (Signed)
We request that you follow-up in: 6 months with an extender and in 1 year with Dr Berry  You will receive a reminder letter in the mail two months in advance. If you don't receive a letter, please call our office to schedule the follow-up appointment.   

## 2013-10-16 NOTE — Progress Notes (Signed)
10/16/2013 Connie Farrell   [birth year]  [id number]  Primary Physician Delphina Cahill, MD Primary Cardiologist: Lorretta Harp MD Renae Gloss   HPI:  The patient is a 53 year old, thin appearing, married, Caucasian female, mother of two, grandmother to one grandchild, referred to me by [name] for peripheral vascular evaluation.   Her cardiac risk factor profile is positive for a 30 pack year of tobacco abuse, currently smoking one pack per day. Recalcitrant to risk factor modification. I did counsel her for greater than three minutes. Her other problems include hypertension and hyperlipidemia, as well as a strong family history for heart disease. She had a negative Myoview performed by [name] at [facility]. She did complain of claudication with Dopplers that suggested a mildly diminished left ABI performed at [facility] and confirmed in our lab suggesting of high grade left common iliac artery stenosis. I performed an angiogram on her April 04, 2012, confirming this with a 40-mm pull-back gradient. I stented her left common iliac artery with result in improvement in her left ABI from 0.82 to 0.96. Improvement in her Dopplers and claudication symptoms as well. Renal Dopplers performed recently showed progression of disease left greater than right which will continue to follow.  I saw her last 07/27/13 ,she denies chest pain, shortness of breath or claudication. She does continue to smoke.    Current Outpatient Prescriptions  Medication Sig Dispense Refill  . aspirin EC 81 MG tablet Take 81 mg by mouth daily.      Marland Kitchen atorvastatin (LIPITOR) 40 MG tablet Take 40 mg by mouth daily.       . clopidogrel (PLAVIX) 75 MG tablet Take 1 tablet (75 mg total) by mouth daily with breakfast.  30 tablet  11  . cyclobenzaprine (FLEXERIL) 10 MG tablet Take 5-10 mg by mouth 2 (two) times daily as needed. For pain      . escitalopram (LEXAPRO) 10 MG tablet Take 10 mg by mouth  Daily.      Marland Kitchen lisinopril-hydrochlorothiazide (PRINZIDE,ZESTORETIC) 20-12.5 MG per tablet Take 1 tablet by mouth daily.       . naproxen sodium (ANAPROX) 220 MG tablet Take 220 mg by mouth 2 (two) times daily with a meal.      . ondansetron (ZOFRAN) 4 MG tablet Take 1 tablet (4 mg total) by mouth every 8 (eight) hours as needed for nausea or vomiting.  20 tablet  0  . pantoprazole (PROTONIX) 40 MG tablet Take 1 tablet (40 mg total) by mouth 2 (two) times daily.  60 tablet  5  . traMADol (ULTRAM) 50 MG tablet Take 50 mg by mouth every 6 (six) hours as needed. For pain       No current facility-administered medications for this visit.    Allergies  Allergen Reactions  . Latex Rash    Blisters, rash  . Codeine Nausea And Vomiting    History   Social History  . Marital Status: Married    Spouse Name: N/A    Number of Children: N/A  . Years of Education: N/A   Occupational History  . Not on file.   Social History Main Topics  . Smoking status: Current Every Day Smoker -- 0.75 packs/day for 36 years    Types: Cigarettes  . Smokeless tobacco: Never Used  . Alcohol Use: No  . Drug Use: No  . Sexual Activity: No   Other Topics Concern  . Not on file   Social  History Narrative  . No narrative on file     Review of Systems: General: negative for chills, fever, night sweats or weight changes.  Cardiovascular: negative for chest pain, dyspnea on exertion, edema, orthopnea, palpitations, paroxysmal nocturnal dyspnea or shortness of breath Dermatological: negative for rash Respiratory: negative for cough or wheezing Urologic: negative for hematuria Abdominal: negative for nausea, vomiting, diarrhea, bright red blood per rectum, melena, or hematemesis Neurologic: negative for visual changes, syncope, or dizziness All other systems reviewed and are otherwise negative except as noted above.    Blood pressure 100/60, pulse 83, height 5\' 5"  (1.651 m), weight 117 lb 6.4 oz (53.252  kg).  General appearance: alert and no distress Neck: no adenopathy, no carotid bruit, no JVD, supple, symmetrical, trachea midline and thyroid not enlarged, symmetric, no tenderness/mass/nodules Lungs: clear to auscultation bilaterally Heart: regular rate and rhythm, S1, S2 normal, no murmur, click, rub or gallop Extremities: extremities normal, atraumatic, no cyanosis or edema  EKG not performed today  ASSESSMENT AND PLAN:   Renal artery stenosis History of 80% left renal artery stenosis documented at angiography 04/04/12. Popliteal Dopplers performed recently have shown progression of disease. 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 We'll continue to follow this noninvasively.  HTN (hypertension) Under good control and her medications  HLD (hyperlipidemia) On statin therapy followed by her PCP  PAD (peripheral artery disease) History of left iliac artery stenting 04/04/12. She has a 50% proximal right external iliac artery stenosis with three-vessel bilaterally. Doppler this procedure were normal. She denies claudication.artery      Lorretta Harp MD [facility], [facility] 10/16/2013 2:02 PM

## 2013-10-16 NOTE — Assessment & Plan Note (Signed)
History of 80% left renal artery stenosis documented at angiography 04/04/12. Popliteal Dopplers performed recently have shown progression of disease. 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 We'll continue to follow this noninvasively.

## 2013-10-16 NOTE — Assessment & Plan Note (Signed)
History of left iliac artery stenting 04/04/12. She has a 50% proximal right external iliac artery stenosis with three-vessel bilaterally. Doppler this procedure were normal. She denies claudication.artery

## 2013-10-16 NOTE — Assessment & Plan Note (Signed)
Under good control and her medications 

## 2013-11-05 ENCOUNTER — Ambulatory Visit: Payer: Managed Care, Other (non HMO) | Admitting: Cardiovascular Disease

## 2013-11-19 ENCOUNTER — Telehealth (HOSPITAL_COMMUNITY): Payer: Self-pay | Admitting: *Deleted

## 2013-12-07 ENCOUNTER — Ambulatory Visit (HOSPITAL_COMMUNITY)
Admission: RE | Admit: 2013-12-07 | Discharge: 2013-12-07 | Disposition: A | Discharge status: Home / Self Care | Payer: Managed Care, Other (non HMO) | Source: Ambulatory Visit | Attending: Cardiovascular Disease | Admitting: Cardiovascular Disease

## 2013-12-07 DIAGNOSIS — I739 Peripheral vascular disease, unspecified: Secondary | ICD-10-CM

## 2013-12-07 DIAGNOSIS — I70219 Atherosclerosis of native arteries of extremities with intermittent claudication, unspecified extremity: Secondary | ICD-10-CM

## 2013-12-07 NOTE — Progress Notes (Signed)
Arterial Duplex Lower Ext. Completed. Lucio Litsey, BS, RDMS, RVT  

## 2013-12-18 ENCOUNTER — Telehealth: Payer: Self-pay | Admitting: *Deleted

## 2013-12-18 ENCOUNTER — Other Ambulatory Visit (HOSPITAL_COMMUNITY): Payer: Self-pay | Admitting: Internal Medicine

## 2013-12-18 DIAGNOSIS — I739 Peripheral vascular disease, unspecified: Secondary | ICD-10-CM

## 2013-12-18 DIAGNOSIS — R109 Unspecified abdominal pain: Secondary | ICD-10-CM

## 2013-12-18 NOTE — Telephone Encounter (Signed)
Spoke to patient. Result given . Verbalized understanding Recall order in for 6 months

## 2013-12-18 NOTE — Telephone Encounter (Signed)
Message copied by Raiford Simmonds on Tue Dec 18, 2013  8:42 AM ------      Message from: Lorretta Harp      Created: Sun Dec 16, 2013  4:32 PM       No change from prior study. Repeat in 6 months ------

## 2013-12-20 ENCOUNTER — Ambulatory Visit (HOSPITAL_COMMUNITY)
Admission: RE | Admit: 2013-12-20 | Discharge: 2013-12-20 | Disposition: A | Discharge status: Home / Self Care | Payer: Managed Care, Other (non HMO) | Source: Ambulatory Visit | Attending: Internal Medicine | Admitting: Internal Medicine

## 2013-12-20 ENCOUNTER — Encounter (HOSPITAL_COMMUNITY): Payer: Self-pay

## 2013-12-20 DIAGNOSIS — R9389 Abnormal findings on diagnostic imaging of other specified body structures: Secondary | ICD-10-CM | POA: Insufficient documentation

## 2013-12-20 DIAGNOSIS — K4090 Unilateral inguinal hernia, without obstruction or gangrene, not specified as recurrent: Secondary | ICD-10-CM | POA: Insufficient documentation

## 2013-12-20 DIAGNOSIS — R109 Unspecified abdominal pain: Secondary | ICD-10-CM

## 2013-12-20 MED ORDER — IOHEXOL 300 MG/ML  SOLN
100.0000 mL | Freq: Once | INTRAMUSCULAR | Status: AC | PRN
  Administered 2013-12-20: 80 mL via INTRAVENOUS

## 2013-12-25 ENCOUNTER — Encounter: Payer: Self-pay | Admitting: *Deleted

## 2014-01-28 ENCOUNTER — Encounter (INDEPENDENT_AMBULATORY_CARE_PROVIDER_SITE_OTHER): Payer: Self-pay

## 2014-01-28 ENCOUNTER — Ambulatory Visit (INDEPENDENT_AMBULATORY_CARE_PROVIDER_SITE_OTHER): Payer: Managed Care, Other (non HMO) | Admitting: Gastroenterology

## 2014-01-28 ENCOUNTER — Encounter: Payer: Self-pay | Admitting: Gastroenterology

## 2014-01-28 VITALS — BP 86/60 | HR 80 | Temp 97.0°F | Resp 18 | Ht 64.0 in | Wt 114.0 lb

## 2014-01-28 DIAGNOSIS — R1310 Dysphagia, unspecified: Secondary | ICD-10-CM | POA: Insufficient documentation

## 2014-01-28 DIAGNOSIS — R1031 Right lower quadrant pain: Secondary | ICD-10-CM

## 2014-01-28 LAB — CBC WITH DIFFERENTIAL/PLATELET
BASOS PCT: 1 % (ref 0–1)
Basophils Absolute: 0.1 10*3/uL (ref 0.0–0.1)
Eosinophils Absolute: 0.2 10*3/uL (ref 0.0–0.7)
Eosinophils Relative: 3 % (ref 0–5)
HCT: 30.5 % — ABNORMAL LOW (ref 36.0–46.0)
Hemoglobin: 10.2 g/dL — ABNORMAL LOW (ref 12.0–15.0)
Lymphocytes Relative: 29 % (ref 12–46)
Lymphs Abs: 1.6 10*3/uL (ref 0.7–4.0)
MCH: 30 pg (ref 26.0–34.0)
MCHC: 33.4 g/dL (ref 30.0–36.0)
MCV: 89.7 fL (ref 78.0–100.0)
MONOS PCT: 5 % (ref 3–12)
Monocytes Absolute: 0.3 10*3/uL (ref 0.1–1.0)
Neutro Abs: 3.4 10*3/uL (ref 1.7–7.7)
Neutrophils Relative %: 62 % (ref 43–77)
PLATELETS: 245 10*3/uL (ref 150–400)
RBC: 3.4 MIL/uL — ABNORMAL LOW (ref 3.87–5.11)
RDW: 15.7 % — AB (ref 11.5–15.5)
WBC: 5.5 10*3/uL (ref 4.0–10.5)

## 2014-01-28 MED ORDER — PEG 3350-KCL-NA BICARB-NACL 420 G PO SOLR: 4000.0000 mL | ORAL | Status: DC

## 2014-01-28 NOTE — Assessment & Plan Note (Signed)
53 year old female with a history of perforated appendicitis, exploratory laparoscopy with appendectomy in Feb 2015, presenting with nagging RLQ pain but overall improved since June. CT in June revealed  loculated fluid collections within pelvis on right. Differentials included postsurgical abscess, tubal ovarian abscess. Treated by Dr. Nevada Crane with antibiotics. Associated weight loss noted, lack of appetite. Some intermittent low-volume hematochezia noted in past but no significant change in bowel habits. She is unsure if she has a positive family history of colon cancer. No prior colonoscopy. Due to continued tenderness, I would like to obtain a CBC, repeat CT scan, and ultimately need colonoscopy with Dr. Gala Romney in near future. Risks and benefits discussed in detail with patient and daughter, who are both in agreement of plan.

## 2014-01-28 NOTE — Assessment & Plan Note (Signed)
Chronic for past 2 years, associated with early satiety, question of possible melena in June 2015 but no overt GI bleeding currently. States this occurred while on antibiotics. Unclear if true melena. CBC now. Needs EGD with dilation at time of colonoscopy to assess for web, ring, stricture, esophagitis, less likely malignancy. Risks and benefits discussed with stated understanding. Continue Protonix daily.

## 2014-01-28 NOTE — Patient Instructions (Signed)
We have scheduled you for a CT scan to reassess your abdomen.   Please have blood work done today.   Finally, you have been scheduled for a colonoscopy, upper endoscopy, and dilation with Dr. Gala Romney in the near future.

## 2014-01-28 NOTE — Progress Notes (Signed)
Primary Care Physician:  Delphina Cahill, MD Primary Gastroenterologist:  Dr. Gala Romney   Chief Complaint  Patient presents with  . Referral    HPI:   Connie Farrell presents today as a new consult secondary to abdominal pain. She was hospitalized for acute appendicitis with perforation, peritonitis in Feb 2015. Underwent exploratory laparoscopy with appendectomy on 2/23. She had a repeat CT scan in June 2015 secondary to persistent pain. This noted loculated fluid collections within pelvis on right. Differentials included postsurgical abscess, tubal ovarian abscess.States she as intermittent RLQ discomfort, improved since June 2015. Feb to June with significant pain. Eating hamburger causes significant pain. Eating chicken, oodles of noodles, plain fish. Craves pickles. Early satiety. Baseline weight in the 130s. Now 114. Scared to eat because she is worried certain foods will bother her. When taking hydrocodone would have some constipation. Now BM 2-3 times per day. Formed. History of hematochezia. Was taking Aleve but stopped in June 2015.   Solid food dysphagia. Pill dysphagia. Symptoms since 2012. Placed on Protonix about 2 years ago, helped with reflux. Occasional breakthrough. Occasional N/V. Worse in the morning with taking medication, especially if early in the morning. Now waiting a few hours before taking medication.   History of tarry stool in June 2015, was on antibiotics at that time. CT June 5 with loculated fluid collections within pelvis on right. Differentials included postsurgical abscess, tubal ovarian abscess. Worsening discomfort with palpation. No fever or chills.   Past Medical History  Diagnosis Date  . Hypertension   . Seasonal allergies   . Neck pain, chronic   . Hyperlipemia   . PAD (peripheral artery disease)   . Exertional dyspnea   . GERD (gastroesophageal reflux disease)   . Arthritis     "hands; spine too" (04/04/2012)  . Anxiety   . Tobacco abuse   . Renal  artery stenosis     Past Surgical History  Procedure Laterality Date  . Anterior cervical decomp/discectomy fusion  ~ 04/2006    C6, 7, T1  . Tubal ligation  1990  . Peripheral arterial stent graft  04/04/2012  . Laparoscopic appendectomy N/A 09/10/2013    Procedure: APPENDECTOMY LAPAROSCOPIC;  Surgeon: Jamesetta So, MD;  Location: [facility];  Service: General;  Laterality: N/A;    Current Outpatient Prescriptions  Medication Sig Dispense Refill  . aspirin EC 81 MG tablet Take 81 mg by mouth daily.      Marland Kitchen atorvastatin (LIPITOR) 40 MG tablet Take 40 mg by mouth daily.       . clopidogrel (PLAVIX) 75 MG tablet Take 1 tablet (75 mg total) by mouth daily with breakfast.  30 tablet  11  . cyclobenzaprine (FLEXERIL) 10 MG tablet Take 5-10 mg by mouth 2 (two) times daily as needed. For pain      . escitalopram (LEXAPRO) 10 MG tablet Take 10 mg by mouth Daily.      Marland Kitchen HYDROcodone-acetaminophen (NORCO/VICODIN) 5-325 MG per tablet Take 1 tablet by mouth every 8 (eight) hours as needed for moderate pain.      Marland Kitchen lisinopril-hydrochlorothiazide (PRINZIDE,ZESTORETIC) 20-12.5 MG per tablet Take 1 tablet by mouth daily.       . ondansetron (ZOFRAN) 4 MG tablet Take 1 tablet (4 mg total) by mouth every 8 (eight) hours as needed for nausea or vomiting.  20 tablet  0  . pantoprazole (PROTONIX) 40 MG tablet Take 1 tablet (40 mg total) by mouth 2 (two) times daily.  60 tablet  5   No current facility-administered medications for this visit.    Allergies as of 01/28/2014 - Review Complete 01/28/2014  Allergen Reaction Noted  . Latex Rash 09/10/2013  . Codeine Nausea And Vomiting 05/28/2011    Family History  Problem Relation Age of Onset  . Heart disease Other   . Arthritis    . Asthma    . Colon cancer      unknown, possibly father    History   Social History  . Marital Status: Married    Spouse Name: N/A    Number of Children: N/A  . Years of Education: N/A   Occupational History  . [employer]   Social History Main Topics  . Smoking status: Current Every Day Smoker -- 0.75 packs/day for 36 years    Types: Cigarettes  . Smokeless tobacco: Never Used  . Alcohol Use: No  . Drug Use: No  . Sexual Activity: No   Other Topics Concern  . Not on file   Social History Narrative  . No narrative on file    Review of Systems: Gen: see HPI CV: one episode of palpitations a few weeks ago  Resp: +DOE GI: see HPI GU : Denies urinary burning, urinary frequency, urinary hesitancy MS: feels like muscles contracting lower extremities, PCP aware, uses cream to help relieve from PCP Derm: Denies rash, itching, dry skin Psych: Denies depression, anxiety, memory loss, and confusion Heme: Denies bruising, bleeding, and enlarged lymph nodes.  Physical Exam: BP 86/60  Pulse 80  Temp(Src) 97 F (36.1 C) (Oral)  Resp 18  Ht 5\' 4"  (1.626 m)  Wt 114 lb (51.71 kg)  BMI 19.56 kg/m2 General:   Alert and oriented. Pleasant and cooperative. Well-nourished and well-developed.  Head:  Normocephalic and atraumatic. Eyes:  Without icterus, sclera clear and conjunctiva pink.  Ears:  Normal auditory acuity. Nose:  No deformity, discharge,  or lesions. Mouth:  No deformity or lesions, oral mucosa pink.  Neck:  Supple, without mass or thyromegaly. Lungs:  Clear to auscultation bilaterally. No wheezes, rales, or rhonchi. No distress.  Heart:  S1, S2 present without murmurs appreciated.  Abdomen:  +BS, soft, mild TTP RLQ but without rebound or guarding. non-distended. No HSM noted.  Rectal:  Deferred  Msk:  Symmetrical without gross deformities. Normal posture. Extremities:  Without clubbing or edema. Neurologic:  Alert and  oriented x4;  grossly normal neurologically. Skin:  Intact without significant lesions or rashes. Cervical Nodes:  No significant cervical adenopathy. Psych:  Alert and cooperative. Normal mood and affect.  Lab Results  Component Value Date   WBC 7.8  09/14/2013   HGB 12.0 09/14/2013   HCT 35.8* 09/14/2013   MCV 97.3 09/14/2013   PLT 245 09/14/2013

## 2014-01-29 NOTE — Progress Notes (Signed)
cc'd to pcp 

## 2014-02-04 ENCOUNTER — Ambulatory Visit (HOSPITAL_COMMUNITY)
Admission: RE | Admit: 2014-02-04 | Discharge: 2014-02-04 | Disposition: A | Discharge status: Home / Self Care | Payer: Managed Care, Other (non HMO) | Source: Ambulatory Visit | Attending: Gastroenterology | Admitting: Gastroenterology

## 2014-02-04 DIAGNOSIS — R1031 Right lower quadrant pain: Secondary | ICD-10-CM | POA: Insufficient documentation

## 2014-02-04 DIAGNOSIS — K8020 Calculus of gallbladder without cholecystitis without obstruction: Secondary | ICD-10-CM | POA: Insufficient documentation

## 2014-02-04 DIAGNOSIS — Z9089 Acquired absence of other organs: Secondary | ICD-10-CM | POA: Insufficient documentation

## 2014-02-04 MED ORDER — IOHEXOL 300 MG/ML  SOLN
80.0000 mL | Freq: Once | INTRAMUSCULAR | Status: AC | PRN
  Administered 2014-02-04: 80 mL via INTRAVENOUS

## 2014-02-05 NOTE — Progress Notes (Signed)
Quick Note:  2 gram drop in Hgb from 12 to 10 since Feb 2015.  Please add iron and ferritin.  Proceed with TCS/EGD as planned. ______

## 2014-02-06 NOTE — Progress Notes (Signed)
Quick Note:  No persistent evidence of abscess on CT. Continue with colonoscopy. ______

## 2014-02-07 ENCOUNTER — Encounter (HOSPITAL_COMMUNITY): Payer: Self-pay | Admitting: Pharmacy Technician

## 2014-02-11 ENCOUNTER — Telehealth: Payer: Self-pay | Admitting: Internal Medicine

## 2014-02-11 NOTE — Telephone Encounter (Signed)
Pt LMOM that she was wanting her results from her CT. Please call [phone]

## 2014-02-12 ENCOUNTER — Other Ambulatory Visit: Payer: Self-pay | Admitting: Gastroenterology

## 2014-02-12 ENCOUNTER — Other Ambulatory Visit: Payer: Self-pay

## 2014-02-12 DIAGNOSIS — D649 Anemia, unspecified: Secondary | ICD-10-CM

## 2014-02-12 NOTE — Telephone Encounter (Signed)
Pt has been given her results.

## 2014-02-16 LAB — IRON: Iron: 33 ug/dL — ABNORMAL LOW (ref 42–145)

## 2014-02-16 LAB — FERRITIN: Ferritin: 9 ng/mL — ABNORMAL LOW (ref 10–291)

## 2014-02-19 NOTE — Progress Notes (Signed)
Quick Note:  IDA noted. Proceed with TCS/EGD as planned. CT on file with resolution of prior issues. ______

## 2014-02-26 ENCOUNTER — Ambulatory Visit (HOSPITAL_COMMUNITY)
Admission: RE | Admit: 2014-02-26 | Discharge: 2014-02-26 | Disposition: A | Discharge status: Home / Self Care | Payer: Managed Care, Other (non HMO) | Source: Ambulatory Visit | Attending: Internal Medicine | Admitting: Internal Medicine

## 2014-02-26 ENCOUNTER — Encounter (HOSPITAL_COMMUNITY): Payer: Self-pay | Admitting: *Deleted

## 2014-02-26 ENCOUNTER — Encounter (HOSPITAL_COMMUNITY)
Admission: RE | Disposition: A | Discharge status: Home / Self Care | Payer: Self-pay | Source: Ambulatory Visit | Attending: Internal Medicine

## 2014-02-26 DIAGNOSIS — K219 Gastro-esophageal reflux disease without esophagitis: Secondary | ICD-10-CM | POA: Insufficient documentation

## 2014-02-26 DIAGNOSIS — Z86010 Personal history of colonic polyps: Secondary | ICD-10-CM

## 2014-02-26 DIAGNOSIS — D126 Benign neoplasm of colon, unspecified: Secondary | ICD-10-CM | POA: Insufficient documentation

## 2014-02-26 DIAGNOSIS — R1031 Right lower quadrant pain: Secondary | ICD-10-CM | POA: Insufficient documentation

## 2014-02-26 DIAGNOSIS — I10 Essential (primary) hypertension: Secondary | ICD-10-CM | POA: Insufficient documentation

## 2014-02-26 DIAGNOSIS — K229 Disease of esophagus, unspecified: Secondary | ICD-10-CM

## 2014-02-26 DIAGNOSIS — Z7982 Long term (current) use of aspirin: Secondary | ICD-10-CM | POA: Insufficient documentation

## 2014-02-26 DIAGNOSIS — K257 Chronic gastric ulcer without hemorrhage or perforation: Secondary | ICD-10-CM

## 2014-02-26 DIAGNOSIS — R1310 Dysphagia, unspecified: Secondary | ICD-10-CM | POA: Insufficient documentation

## 2014-02-26 DIAGNOSIS — E785 Hyperlipidemia, unspecified: Secondary | ICD-10-CM | POA: Insufficient documentation

## 2014-02-26 DIAGNOSIS — Z79899 Other long term (current) drug therapy: Secondary | ICD-10-CM | POA: Insufficient documentation

## 2014-02-26 DIAGNOSIS — F411 Generalized anxiety disorder: Secondary | ICD-10-CM | POA: Insufficient documentation

## 2014-02-26 HISTORY — PX: COLONOSCOPY: SHX5424

## 2014-02-26 HISTORY — PX: MALONEY DILATION: SHX5535

## 2014-02-26 HISTORY — PX: ESOPHAGOGASTRODUODENOSCOPY: SHX5428

## 2014-02-26 SURGERY — COLONOSCOPY
Anesthesia: Moderate Sedation

## 2014-02-26 MED ORDER — MEPERIDINE HCL 100 MG/ML IJ SOLN
INTRAMUSCULAR | Status: AC
  Filled 2014-02-26: qty 2

## 2014-02-26 MED ORDER — STERILE WATER FOR IRRIGATION IR SOLN
Status: DC | PRN
  Administered 2014-02-26: 09:00:00

## 2014-02-26 MED ORDER — ONDANSETRON HCL 4 MG/2ML IJ SOLN
INTRAMUSCULAR | Status: DC
Start: 2014-02-26 — End: 2014-02-26
  Filled 2014-02-26: qty 2

## 2014-02-26 MED ORDER — LIDOCAINE VISCOUS 2 % MT SOLN
OROMUCOSAL | Status: DC | PRN
  Administered 2014-02-26: 3 mL via OROMUCOSAL

## 2014-02-26 MED ORDER — LIDOCAINE VISCOUS 2 % MT SOLN
OROMUCOSAL | Status: AC
  Filled 2014-02-26: qty 15

## 2014-02-26 MED ORDER — MEPERIDINE HCL 100 MG/ML IJ SOLN
INTRAMUSCULAR | Status: DC | PRN
  Administered 2014-02-26: 50 mg via INTRAVENOUS
  Administered 2014-02-26: 25 mg via INTRAVENOUS

## 2014-02-26 MED ORDER — SODIUM CHLORIDE 0.9 % IV SOLN
INTRAVENOUS | Status: DC
  Administered 2014-02-26: 08:00:00 via INTRAVENOUS

## 2014-02-26 MED ORDER — MIDAZOLAM HCL 5 MG/5ML IJ SOLN
INTRAMUSCULAR | Status: DC
Start: 2014-02-26 — End: 2014-02-26
  Filled 2014-02-26: qty 10

## 2014-02-26 MED ORDER — MIDAZOLAM HCL 5 MG/5ML IJ SOLN
INTRAMUSCULAR | Status: DC | PRN
  Administered 2014-02-26 (×2): 1 mg via INTRAVENOUS
  Administered 2014-02-26: 2 mg via INTRAVENOUS
  Administered 2014-02-26 (×2): 1 mg via INTRAVENOUS
  Administered 2014-02-26: 2 mg via INTRAVENOUS
  Administered 2014-02-26: 1 mg via INTRAVENOUS

## 2014-02-26 MED ORDER — ONDANSETRON HCL 4 MG/2ML IJ SOLN
INTRAMUSCULAR | Status: DC | PRN
  Administered 2014-02-26: 4 mg via INTRAVENOUS

## 2014-02-26 NOTE — Op Note (Signed)
[facility] [address] [place], [address]   COLONOSCOPY PROCEDURE REPORT  PATIENT: Connie Farrell, Connie Farrell  MR#:         [id number] BIRTHDATE: 1961/02/21 , 53  yrs. old GENDER: Female ENDOSCOPIST: R.  Garfield Cornea, MD FACP FACG REFERRED BY:  Delphina Cahill, M.D. PROCEDURE DATE:  02/26/2014 PROCEDURE:     ileocolonoscopy with biopsy  INDICATIONS: Iron deficiency anemia; rare paper hematochezia; Right lower quadrant abdominal pain  -  resolved  INFORMED CONSENT:  The risks, benefits, alternatives and imponderables including but not limited to bleeding, perforation as well as the possibility of a missed lesion have been reviewed.  The potential for biopsy, lesion removal, etc. have also been discussed.  Questions have been answered.  All parties agreeable. Please see the history and physical in the medical record for more information.  MEDICATIONS: Versed 9 mg IV and Demerol 75 mg IV in divided doses. Zofran 4 mg IV.  DESCRIPTION OF PROCEDURE:  After a digital rectal exam was performed, the EG-[id number] ([id number]) and EC-[id number] ([id number]) colonoscope was advanced from the anus through the rectum and colon to the area of the cecum, ileocecal valve and appendiceal orifice. The cecum was deeply intubated.  These structures were well-seen and photographed for the record.  From the level of the cecum and ileocecal valve, the scope was slowly and cautiously withdrawn. The mucosal surfaces were carefully surveyed utilizing scope tip deflection to facilitate fold flattening as needed.  The scope was pulled down into the rectum where a thorough examination including retroflexion was performed.    FINDINGS:  Adequate preparation. Minimal anal canal hemorrhoids; otherwise, normal rectum. (2) diminutive polyps in the mid descending segment; otherwise, the remainder of colonic mucosa appeared normal. The distal 5 cm of terminal colon mucosa also appeared  THERAPEUTIC / DIAGNOSTIC  MANEUVERS PERFORMED:  The above-mentioned polyps were cold biopsied/removed.  COMPLICATIONS: none  CECAL WITHDRAWAL TIME:  9 minutes  IMPRESSION:  Colonic polyps-removed as described above.  RECOMMENDATIONS:  Followup on pathology. See EGD report.   _______________________________ eSigned:  R. Garfield Cornea, MD FACP [facility] 02/26/2014 9:45 AM   CC:

## 2014-02-26 NOTE — Discharge Instructions (Addendum)
EGD Discharge instructions Please read the instructions outlined below and refer to this sheet in the next few weeks. These discharge instructions provide you with general information on caring for yourself after you leave the hospital. Your doctor may also give you specific instructions. While your treatment has been planned according to the most current medical practices available, unavoidable complications occasionally occur. If you have any problems or questions after discharge, please call your doctor. ACTIVITY  You may resume your regular activity but move at a slower pace for the next 24 hours.   Take frequent rest periods for the next 24 hours.   Walking will help expel (get rid of) the air and reduce the bloated feeling in your abdomen.   No driving for 24 hours (because of the anesthesia (medicine) used during the test).   You may shower.   Do not sign any important legal documents or operate any machinery for 24 hours (because of the anesthesia used during the test).  NUTRITION  Drink plenty of fluids.   You may resume your normal diet.   Begin with a light meal and progress to your normal diet.   Avoid alcoholic beverages for 24 hours or as instructed by your caregiver.  MEDICATIONS  You may resume your normal medications unless your caregiver tells you otherwise.  WHAT YOU CAN EXPECT TODAY  You may experience abdominal discomfort such as a feeling of fullness or gas pains.  FOLLOW-UP  Your doctor will discuss the results of your test with you.  SEEK IMMEDIATE MEDICAL ATTENTION IF ANY OF THE FOLLOWING OCCUR:  Excessive nausea (feeling sick to your stomach) and/or vomiting.   Severe abdominal pain and distention (swelling).   Trouble swallowing.   Temperature over 101 F (37.8 C).   Rectal bleeding or vomiting of blood.   Colonoscopy Discharge Instructions  Read the instructions outlined below and refer to this sheet in the next few weeks. These  discharge instructions provide you with general information on caring for yourself after you leave the hospital. Your doctor may also give you specific instructions. While your treatment has been planned according to the most current medical practices available, unavoidable complications occasionally occur. If you have any problems or questions after discharge, call Dr. Gala Romney at [phone]. ACTIVITY  You may resume your regular activity, but move at a slower pace for the next 24 hours.   Take frequent rest periods for the next 24 hours.   Walking will help get rid of the air and reduce the bloated feeling in your belly (abdomen).   No driving for 24 hours (because of the medicine (anesthesia) used during the test).    Do not sign any important legal documents or operate any machinery for 24 hours (because of the anesthesia used during the test).  NUTRITION  Drink plenty of fluids.   You may resume your normal diet as instructed by your doctor.   Begin with a light meal and progress to your normal diet. Heavy or fried foods are harder to digest and may make you feel sick to your stomach (nauseated).   Avoid alcoholic beverages for 24 hours or as instructed.  MEDICATIONS  You may resume your normal medications unless your doctor tells you otherwise.  WHAT YOU CAN EXPECT TODAY  Some feelings of bloating in the abdomen.   Passage of more gas than usual.   Spotting of blood in your stool or on the toilet paper.  IF YOU HAD POLYPS REMOVED DURING THE COLONOSCOPY:  No aspirin products for 7 days or as instructed.   No alcohol for 7 days or as instructed.   Eat a soft diet for the next 24 hours.  FINDING OUT THE RESULTS OF YOUR TEST Not all test results are available during your visit. If your test results are not back during the visit, make an appointment with your caregiver to find out the results. Do not assume everything is normal if you have not heard from your caregiver or the  medical facility. It is important for you to follow up on all of your test results.  SEEK IMMEDIATE MEDICAL ATTENTION IF:  You have more than a spotting of blood in your stool.   Your belly is swollen (abdominal distention).   You are nauseated or vomiting.   You have a temperature over 101.   You have abdominal pain or discomfort that is severe or gets worse throughout the day.   Begin ferrous sulfate 325 mg twice daily  Further recommendations to follow pending review of pathology report  Office visit and CBC with [place] in 8 weeks.                                                                                                                               EGD Discharge instructions Please read the instructions outlined below and refer to this sheet in the next few weeks. These discharge instructions provide you with general information on caring for yourself after you leave the hospital. Your doctor may also give you specific instructions. While your treatment has been planned according to the most current medical practices available, unavoidable complications occasionally occur. If you have any problems or questions after discharge, please call your doctor. ACTIVITY  You may resume your regular activity but move at a slower pace for the next 24 hours.   Take frequent rest periods for the next 24 hours.   Walking will help expel (get rid of) the air and reduce the bloated feeling in your abdomen.   No driving for 24 hours (because of the anesthesia (medicine) used during the test).   You may shower.   Do not sign any important legal documents or operate any machinery for 24 hours (because of the anesthesia used during the test).  NUTRITION  Drink plenty of fluids.   You may resume your normal diet.   Begin with a light meal and progress to your normal  diet.   Avoid alcoholic beverages for 24 hours or as instructed by your caregiver.  MEDICATIONS  You may resume your normal medications unless your caregiver tells you otherwise.  WHAT YOU CAN EXPECT TODAY  You may experience abdominal discomfort such as a feeling of fullness or gas pains.  FOLLOW-UP  Your doctor will discuss the results of your test with you.  SEEK IMMEDIATE MEDICAL ATTENTION IF ANY OF THE FOLLOWING OCCUR:  Excessive nausea (feeling sick to your stomach) and/or vomiting.   Severe  abdominal pain and distention (swelling).   Trouble swallowing.   Temperature over 101 F (37.8 C).   Rectal bleeding or vomiting of blood.   Colonoscopy Discharge Instructions  Read the instructions outlined below and refer to this sheet in the next few weeks. These discharge instructions provide you with general information on caring for yourself after you leave the hospital. Your doctor may also give you specific instructions. While your treatment has been planned according to the most current medical practices available, unavoidable complications occasionally occur. If you have any problems or questions after discharge, call Dr. Gala Romney at [phone]. ACTIVITY  You may resume your regular activity, but move at a slower pace for the next 24 hours.   Take frequent rest periods for the next 24 hours.   Walking will help get rid of the air and reduce the bloated feeling in your belly (abdomen).   No driving for 24 hours (because of the medicine (anesthesia) used during the test).    Do not sign any important legal documents or operate any machinery for 24 hours (because of the anesthesia used during the test).  NUTRITION  Drink plenty of fluids.   You may resume your normal diet as instructed by your doctor.   Begin with a light meal and progress to your normal diet. Heavy or fried foods are harder to digest and may make you feel sick to your stomach (nauseated).   Avoid  alcoholic beverages for 24 hours or as instructed.  MEDICATIONS  You may resume your normal medications unless your doctor tells you otherwise.  WHAT YOU CAN EXPECT TODAY  Some feelings of bloating in the abdomen.   Passage of more gas than usual.   Spotting of blood in your stool or on the toilet paper.  IF YOU HAD POLYPS REMOVED DURING THE COLONOSCOPY:  No aspirin products for 7 days or as instructed.   No alcohol for 7 days or as instructed.   Eat a soft diet for the next 24 hours.  FINDING OUT THE RESULTS OF YOUR TEST Not all test results are available during your visit. If your test results are not back during the visit, make an appointment with your caregiver to find out the results. Do not assume everything is normal if you have not heard from your caregiver or the medical facility. It is important for you to follow up on all of your test results.  SEEK IMMEDIATE MEDICAL ATTENTION IF:  You have more than a spotting of blood in your stool.   Your belly is swollen (abdominal distention).   You are nauseated or vomiting.   You have a temperature over 101.   You have abdominal pain or discomfort that is severe or gets worse throughout the day.                     Begin iron supplement as prescribed  Further recommendations to follow pending review of pathology report  CBC and office visit with [place] in 6-8 weeks

## 2014-02-26 NOTE — H&P (View-Only) (Signed)
Primary Care Physician:  Delphina Cahill, MD Primary Gastroenterologist:  Dr. Gala Romney   Chief Complaint  Patient presents with  . Referral    HPI:   Connie Farrell presents today as a new consult secondary to abdominal pain. She was hospitalized for acute appendicitis with perforation, peritonitis in Feb 2015. Underwent exploratory laparoscopy with appendectomy on 2/23. She had a repeat CT scan in June 2015 secondary to persistent pain. This noted loculated fluid collections within pelvis on right. Differentials included postsurgical abscess, tubal ovarian abscess.States she as intermittent RLQ discomfort, improved since June 2015. Feb to June with significant pain. Eating hamburger causes significant pain. Eating chicken, oodles of noodles, plain fish. Craves pickles. Early satiety. Baseline weight in the 130s. Now 114. Scared to eat because she is worried certain foods will bother her. When taking hydrocodone would have some constipation. Now BM 2-3 times per day. Formed. History of hematochezia. Was taking Aleve but stopped in June 2015.   Solid food dysphagia. Pill dysphagia. Symptoms since 2012. Placed on Protonix about 2 years ago, helped with reflux. Occasional breakthrough. Occasional N/V. Worse in the morning with taking medication, especially if early in the morning. Now waiting a few hours before taking medication.   History of tarry stool in June 2015, was on antibiotics at that time. CT June 5 with loculated fluid collections within pelvis on right. Differentials included postsurgical abscess, tubal ovarian abscess. Worsening discomfort with palpation. No fever or chills.   Past Medical History  Diagnosis Date  . Hypertension   . Seasonal allergies   . Neck pain, chronic   . Hyperlipemia   . PAD (peripheral artery disease)   . Exertional dyspnea   . GERD (gastroesophageal reflux disease)   . Arthritis     "hands; spine too" (04/04/2012)  . Anxiety   . Tobacco abuse   . Renal  artery stenosis     Past Surgical History  Procedure Laterality Date  . Anterior cervical decomp/discectomy fusion  ~ 04/2006    C6, 7, T1  . Tubal ligation  1990  . Peripheral arterial stent graft  04/04/2012  . Laparoscopic appendectomy N/A 09/10/2013    Procedure: APPENDECTOMY LAPAROSCOPIC;  Surgeon: Jamesetta So, MD;  Location: [facility];  Service: General;  Laterality: N/A;    Current Outpatient Prescriptions  Medication Sig Dispense Refill  . aspirin EC 81 MG tablet Take 81 mg by mouth daily.      Marland Kitchen atorvastatin (LIPITOR) 40 MG tablet Take 40 mg by mouth daily.       . clopidogrel (PLAVIX) 75 MG tablet Take 1 tablet (75 mg total) by mouth daily with breakfast.  30 tablet  11  . cyclobenzaprine (FLEXERIL) 10 MG tablet Take 5-10 mg by mouth 2 (two) times daily as needed. For pain      . escitalopram (LEXAPRO) 10 MG tablet Take 10 mg by mouth Daily.      Marland Kitchen HYDROcodone-acetaminophen (NORCO/VICODIN) 5-325 MG per tablet Take 1 tablet by mouth every 8 (eight) hours as needed for moderate pain.      Marland Kitchen lisinopril-hydrochlorothiazide (PRINZIDE,ZESTORETIC) 20-12.5 MG per tablet Take 1 tablet by mouth daily.       . ondansetron (ZOFRAN) 4 MG tablet Take 1 tablet (4 mg total) by mouth every 8 (eight) hours as needed for nausea or vomiting.  20 tablet  0  . pantoprazole (PROTONIX) 40 MG tablet Take 1 tablet (40 mg total) by mouth 2 (two) times daily.  60 tablet  5   No current facility-administered medications for this visit.    Allergies as of 01/28/2014 - Review Complete 01/28/2014  Allergen Reaction Noted  . Latex Rash 09/10/2013  . Codeine Nausea And Vomiting 05/28/2011    Family History  Problem Relation Age of Onset  . Heart disease Other   . Arthritis    . Asthma    . Colon cancer      unknown, possibly father    History   Social History  . Marital Status: Married    Spouse Name: N/A    Number of Children: N/A  . Years of Education: N/A   Occupational History  . [employer]   Social History Main Topics  . Smoking status: Current Every Day Smoker -- 0.75 packs/day for 36 years    Types: Cigarettes  . Smokeless tobacco: Never Used  . Alcohol Use: No  . Drug Use: No  . Sexual Activity: No   Other Topics Concern  . Not on file   Social History Narrative  . No narrative on file    Review of Systems: Gen: see HPI CV: one episode of palpitations a few weeks ago  Resp: +DOE GI: see HPI GU : Denies urinary burning, urinary frequency, urinary hesitancy MS: feels like muscles contracting lower extremities, PCP aware, uses cream to help relieve from PCP Derm: Denies rash, itching, dry skin Psych: Denies depression, anxiety, memory loss, and confusion Heme: Denies bruising, bleeding, and enlarged lymph nodes.  Physical Exam: BP 86/60  Pulse 80  Temp(Src) 97 F (36.1 C) (Oral)  Resp 18  Ht 5\' 4"  (1.626 m)  Wt 114 lb (51.71 kg)  BMI 19.56 kg/m2 General:   Alert and oriented. Pleasant and cooperative. Well-nourished and well-developed.  Head:  Normocephalic and atraumatic. Eyes:  Without icterus, sclera clear and conjunctiva pink.  Ears:  Normal auditory acuity. Nose:  No deformity, discharge,  or lesions. Mouth:  No deformity or lesions, oral mucosa pink.  Neck:  Supple, without mass or thyromegaly. Lungs:  Clear to auscultation bilaterally. No wheezes, rales, or rhonchi. No distress.  Heart:  S1, S2 present without murmurs appreciated.  Abdomen:  +BS, soft, mild TTP RLQ but without rebound or guarding. non-distended. No HSM noted.  Rectal:  Deferred  Msk:  Symmetrical without gross deformities. Normal posture. Extremities:  Without clubbing or edema. Neurologic:  Alert and  oriented x4;  grossly normal neurologically. Skin:  Intact without significant lesions or rashes. Cervical Nodes:  No significant cervical adenopathy. Psych:  Alert and cooperative. Normal mood and affect.  Lab Results  Component Value Date   WBC 7.8  09/14/2013   HGB 12.0 09/14/2013   HCT 35.8* 09/14/2013   MCV 97.3 09/14/2013   PLT 245 09/14/2013

## 2014-02-26 NOTE — Op Note (Signed)
[facility] [address] [place], [address]   ENDOSCOPY PROCEDURE REPORT  PATIENT: Connie Farrell, Connie Farrell  MR#: [id number] BIRTHDATE: 1961-06-19 , 53  yrs. old GENDER: Female ENDOSCOPIST: R.  Garfield Cornea, MD FACP FACG REFERRED BY:  Delphina Cahill, M.D. PROCEDURE DATE:  02/26/2014 PROCEDURE:     EGD with Venia Minks dilation followed by esophageal and gastric biopsy  INDICATIONS:     esophageal dysphagia; GERD; iron deficiency anemia  INFORMED CONSENT:   The risks, benefits, limitations, alternatives and imponderables have been discussed.  The potential for biopsy, esophogeal dilation, etc. have also been reviewed.  Questions have been answered.  All parties agreeable.  Please see the history and physical in the medical record for more information.  MEDICATIONS:    Versed 6 mg IV and Demerol 75 mg IV in divided doses. Zofran 4 mg IV. Xylocaine gel orally  DESCRIPTION OF PROCEDURE:   The [id number]-[id number] ([id number])  endoscope was introduced through the mouth and advanced to the second portion of the duodenum without difficulty or limitations.  The mucosal surfaces were surveyed very carefully during advancement of the scope and upon withdrawal.  Retroflexion view of the proximal stomach and esophagogastric junction was performed.      FINDINGS:  Patent tubular esophagus. "2 cm tongue" of salmon colored-epithelium coming up above the GE junction. No nodularity. No esophagitis. Stomach empty. A couple of antral erosions. Some "mosaicism" appearing gastric mucosa. No ulcer or infiltrating process. Patent pylorus. Normal first and second portion of the duodenum  THERAPEUTIC / DIAGNOSTIC MANEUVERS PERFORMED:  A 54 French Maloney dilator was passed to full insertion easily. A look back revealed no complication related to this maneuver. Subsequently, biopsies of the abnormal-appearing distal esophagus and stomach were taken separately.   COMPLICATIONS:  None  IMPRESSION:    Abnormal distal esophagus - query short segment Barrett's. Status post Venia Minks dilation followed by esophageal biopsy. Abnormal gastric mucosa of uncertain clinical significance-status post gastric biopsy  RECOMMENDATIONS:  followup on pathology. See colonoscopy report    _______________________________ R. Garfield Cornea, MD FACP [facility] eSigned:  R. Garfield Cornea, MD FACP [facility] 02/26/2014 9:13 AM     CC:

## 2014-02-26 NOTE — Interval H&P Note (Signed)
History and Physical Interval Note:  02/26/2014 8:38 AM  Jocelin C Done  has presented today for surgery, with the diagnosis of RLQ PAIN AND DYSPHAGIA  The various methods of treatment have been discussed with the patient and family. After consideration of risks, benefits and other options for treatment, the patient has consented to  Procedure(s) with comments: COLONOSCOPY (N/A) - 8:30 ESOPHAGOGASTRODUODENOSCOPY (EGD) (N/A) SAVORY DILATION (N/A) MALONEY DILATION (N/A) as a surgical intervention .  The patient's history has been reviewed, patient examined, no change in status, stable for surgery.  I have reviewed the patient's chart and labs.  Questions were answered to the patient's satisfaction.     Milferd Ansell  No change for EGD with possible esophageal dilation and colonoscopy per plan.The risks, benefits, limitations, imponderables and alternatives regarding both EGD and colonoscopy have been reviewed with the patient. Questions have been answered. All parties agreeable.

## 2014-03-01 ENCOUNTER — Encounter: Payer: Self-pay | Admitting: Internal Medicine

## 2014-03-04 ENCOUNTER — Encounter (HOSPITAL_COMMUNITY): Payer: Self-pay | Admitting: Internal Medicine

## 2014-03-04 ENCOUNTER — Encounter: Payer: Self-pay | Admitting: Internal Medicine

## 2014-03-19 ENCOUNTER — Telehealth (HOSPITAL_COMMUNITY): Payer: Self-pay | Admitting: *Deleted

## 2014-03-21 ENCOUNTER — Other Ambulatory Visit: Payer: Self-pay | Admitting: Cardiovascular Disease

## 2014-03-21 NOTE — Telephone Encounter (Signed)
Rx was sent to pharmacy electronically. 

## 2014-03-28 ENCOUNTER — Other Ambulatory Visit (HOSPITAL_COMMUNITY): Payer: Self-pay | Admitting: Cardiovascular Disease

## 2014-03-28 DIAGNOSIS — I739 Peripheral vascular disease, unspecified: Secondary | ICD-10-CM

## 2014-03-28 DIAGNOSIS — I701 Atherosclerosis of renal artery: Secondary | ICD-10-CM

## 2014-04-02 ENCOUNTER — Ambulatory Visit (HOSPITAL_COMMUNITY)
Admission: RE | Admit: 2014-04-02 | Discharge: 2014-04-02 | Disposition: A | Discharge status: Home / Self Care | Payer: Managed Care, Other (non HMO) | Source: Ambulatory Visit | Attending: Internal Medicine | Admitting: Internal Medicine

## 2014-04-02 DIAGNOSIS — I739 Peripheral vascular disease, unspecified: Secondary | ICD-10-CM | POA: Insufficient documentation

## 2014-04-02 DIAGNOSIS — I701 Atherosclerosis of renal artery: Secondary | ICD-10-CM | POA: Insufficient documentation

## 2014-04-02 NOTE — Progress Notes (Signed)
Renal Artery Duplex Completed. °Brianna L Mazza,RVT °

## 2014-04-05 ENCOUNTER — Telehealth: Payer: Self-pay | Admitting: Cardiovascular Disease

## 2014-04-05 NOTE — Telephone Encounter (Signed)
Pt called in stating that she was returning Connie Farrell's call about some results from a test. Please call  Thanks

## 2014-04-05 NOTE — Telephone Encounter (Signed)
Spoke with patient and gave doppler results 

## 2014-04-16 ENCOUNTER — Encounter: Payer: Self-pay | Admitting: Cardiovascular Disease

## 2014-04-16 ENCOUNTER — Encounter (HOSPITAL_COMMUNITY): Payer: Self-pay | Admitting: Pharmacist

## 2014-04-16 ENCOUNTER — Ambulatory Visit (INDEPENDENT_AMBULATORY_CARE_PROVIDER_SITE_OTHER): Payer: Managed Care, Other (non HMO) | Admitting: Cardiovascular Disease

## 2014-04-16 VITALS — BP 121/80 | HR 82 | Ht 65.0 in | Wt 119.0 lb

## 2014-04-16 DIAGNOSIS — E785 Hyperlipidemia, unspecified: Secondary | ICD-10-CM

## 2014-04-16 DIAGNOSIS — I739 Peripheral vascular disease, unspecified: Secondary | ICD-10-CM

## 2014-04-16 DIAGNOSIS — D689 Coagulation defect, unspecified: Secondary | ICD-10-CM

## 2014-04-16 DIAGNOSIS — N289 Disorder of kidney and ureter, unspecified: Secondary | ICD-10-CM

## 2014-04-16 DIAGNOSIS — I701 Atherosclerosis of renal artery: Secondary | ICD-10-CM

## 2014-04-16 DIAGNOSIS — Z79899 Other long term (current) drug therapy: Secondary | ICD-10-CM

## 2014-04-16 DIAGNOSIS — I10 Essential (primary) hypertension: Secondary | ICD-10-CM

## 2014-04-16 LAB — CBC
HCT: 33.8 % — ABNORMAL LOW (ref 36.0–46.0)
HEMOGLOBIN: 11.3 g/dL — AB (ref 12.0–15.0)
MCH: 31.2 pg (ref 26.0–34.0)
MCHC: 33.4 g/dL (ref 30.0–36.0)
MCV: 93.4 fL (ref 78.0–100.0)
PLATELETS: 242 10*3/uL (ref 150–400)
RBC: 3.62 MIL/uL — ABNORMAL LOW (ref 3.87–5.11)
RDW: 17.3 % — ABNORMAL HIGH (ref 11.5–15.5)
WBC: 6 10*3/uL (ref 4.0–10.5)

## 2014-04-16 LAB — BASIC METABOLIC PANEL
BUN: 20 mg/dL (ref 6–23)
CO2: 21 meq/L (ref 19–32)
CREATININE: 1.36 mg/dL — AB (ref 0.50–1.10)
Calcium: 9.7 mg/dL (ref 8.4–10.5)
Chloride: 105 mEq/L (ref 96–112)
Glucose, Bld: 83 mg/dL (ref 70–99)
POTASSIUM: 4.6 meq/L (ref 3.5–5.3)
Sodium: 137 mEq/L (ref 135–145)

## 2014-04-16 LAB — APTT: APTT: 33 s (ref 24–37)

## 2014-04-16 LAB — PROTIME-INR
INR: 1.04 (ref <1.50)
Prothrombin Time: 13.6 seconds (ref 11.6–15.2)

## 2014-04-16 NOTE — Assessment & Plan Note (Signed)
History of left common iliac artery stenting by myself 04/04/12. Followup Dopplers have revealed this to be widely patent. She denies claudication.

## 2014-04-16 NOTE — Assessment & Plan Note (Signed)
On statin therapy followed by her PCP 

## 2014-04-16 NOTE — Progress Notes (Signed)
04/16/2014 Connie Farrell   11/16/1960  [id number]  Primary Physician Delphina Cahill, MD Primary Cardiologist: Lorretta Harp MD Renae Gloss   HPI:  The patient is a 53 year old, thin appearing, married, Caucasian female, mother of two, grandmother to one grandchild, referred to me by [name] for peripheral vascular evaluation.   Her cardiac risk factor profile is positive for a 30 pack year of tobacco abuse, currently smoking one pack per day. Recalcitrant to risk factor modification. I did counsel her for greater than three minutes. Her other problems include hypertension and hyperlipidemia, as well as a strong family history for heart disease. She had a negative Myoview performed by [name] at [facility]. 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 I performed an angiogram on her April 04, 2012, confirming this with a 40-mm pull-back gradient. I stented her left common iliac artery with result in improvement in her left ABI from 0.82 to 0.96. Improvement in her Dopplers and claudication symptoms as well. We have been following her renal Dopplers closely which is usually has shown a significant progression of disease with decline in her portable dimension the dimension from 10.7-9.2 cm an increase in her serum creatinine from 1.2-2.0. Her primary care physician subsequently discontinued her ACE inhibitor.  Current Outpatient Prescriptions  Medication Sig Dispense Refill  . aspirin EC 81 MG tablet Take 81 mg by mouth daily.      Marland Kitchen atorvastatin (LIPITOR) 40 MG tablet Take 40 mg by mouth daily.       . clopidogrel (PLAVIX) 75 MG tablet TAKE ONE TABLET DAILY WITH BREAKFAST.  30 tablet  6  . cyclobenzaprine (FLEXERIL) 10 MG tablet Take 5-10 mg by mouth 3 (three) times daily as needed for muscle spasms.       .  Cyclobenzaprine HCl POWD       . escitalopram (LEXAPRO) 10 MG tablet Take 10 mg by mouth Daily.      . ferrous sulfate 325 (65 FE) MG tablet Take 325 mg by mouth daily with breakfast.      . HYDROcodone-acetaminophen (NORCO/VICODIN) 5-325 MG per tablet Take 1 tablet by mouth every 8 (eight) hours as needed for moderate pain.      . pantoprazole (PROTONIX) 40 MG tablet Take 1 tablet (40 mg total) by mouth 2 (two) times daily.  60 tablet  5   No current facility-administered medications for this visit.    Allergies  Allergen Reactions  . Latex Rash    Blisters, rash  . Codeine Nausea And Vomiting    History   Social History  . Marital Status: Married    Spouse Name: N/A    Number of Children: N/A  . Years of Education: N/A   Occupational History  . [employer]   Social History Main Topics  . Smoking status: Current Every Day Smoker -- 0.75 packs/day for 36 years    Types: Cigarettes  . Smokeless tobacco: Never Used  . Alcohol Use: No  . Drug Use: No  . Sexual Activity: No   Other Topics Concern  . Not on file   Social History Narrative  . No narrative on file     Review of Systems: General: negative for chills, fever, night sweats or weight changes.  Cardiovascular: negative for chest pain,  dyspnea on exertion, edema, orthopnea, palpitations, paroxysmal nocturnal dyspnea or shortness of breath Dermatological: negative for rash Respiratory: negative for cough or wheezing Urologic: negative for hematuria Abdominal: negative for nausea, vomiting, diarrhea, bright red blood per rectum, melena, or hematemesis Neurologic: negative for visual changes, syncope, or dizziness All other systems reviewed and are otherwise negative except as noted above.    Blood pressure 121/80, pulse 82, height 5\' 5"  (1.651 m), weight 119 lb (53.978 kg).  General appearance: alert and no distress Neck: no adenopathy, no carotid bruit, no JVD, supple, symmetrical, trachea  midline and thyroid not enlarged, symmetric, no tenderness/mass/nodules Lungs: clear to auscultation bilaterally Heart: regular rate and rhythm, S1, S2 normal, no murmur, click, rub or gallop Extremities: extremities normal, atraumatic, no cyanosis or edema  EKG normal sinus rhythm at 78 without ST or T wave changes  ASSESSMENT AND PLAN:   PAD (peripheral artery disease) History of left common iliac artery stenting by myself 04/04/12. Followup Dopplers have revealed this to be widely patent. She denies claudication.  HTN (hypertension) Controlled on current medications. Her primary care physician recently discontinued her ACE inhibitor because of an increase in her serum creatinine.  Renal artery stenosis History of 80% renal artery stenosis documented by angiography 2 years ago. Following this document ultrasound which most recently has shown significant progression 04/02/14 with decline in her left pole-to-pole dimensions from 10.7-9.2 cm. I suspect that her left renal artery stenosis is now occlusive. I'm going to schedule her to have angiography and intervention for renal preservation.  HLD (hyperlipidemia) On statin therapy followed by her PCP      Lorretta Harp MD [facility], [facility] 04/16/2014 10:36 AM

## 2014-04-16 NOTE — Patient Instructions (Addendum)
[name] has ordered a peripheral angiogram to be done at [facility].  This procedure is going to look at the bloodflow in your lower extremities.  If [name] is able to open up the arteries, you will have to spend one night in the hospital.  If he is not able to open the arteries, you will be able to go home that same day.    After the procedure, you will not be allowed to drive for 3 days or push, pull, or lift anything greater than 10 lbs for one week.    You will be required to have the following tests prior to the procedure:  1. Blood work-the blood work can be done no more than 7 days prior to the procedure.  It can be done at any [facility] lab.  There is one downstairs on the first floor of this building and one in the [place] ([address])    *REPS n/a  Have blood work done today

## 2014-04-16 NOTE — Assessment & Plan Note (Signed)
History of 80% renal artery stenosis documented by angiography 2 years ago. Following this document ultrasound which most recently has shown significant progression 04/02/14 with decline in her left pole-to-pole dimensions from 10.7-9.2 cm. I suspect that her left renal artery stenosis is now occlusive. I'm going to schedule her to have angiography and intervention for renal preservation.

## 2014-04-16 NOTE — Assessment & Plan Note (Signed)
Controlled on current medications. Her primary care physician recently discontinued her ACE inhibitor because of an increase in her serum creatinine.

## 2014-04-17 ENCOUNTER — Other Ambulatory Visit: Payer: Self-pay | Admitting: *Deleted

## 2014-04-17 DIAGNOSIS — Z01818 Encounter for other preprocedural examination: Secondary | ICD-10-CM

## 2014-04-18 ENCOUNTER — Other Ambulatory Visit: Payer: Self-pay | Admitting: Cardiovascular Disease

## 2014-04-18 ENCOUNTER — Other Ambulatory Visit: Payer: Self-pay | Admitting: *Deleted

## 2014-04-18 NOTE — Telephone Encounter (Signed)
Rx was sent to pharmacy electronically. 

## 2014-04-22 ENCOUNTER — Encounter (HOSPITAL_COMMUNITY)
Admission: RE | Disposition: A | Discharge status: Home / Self Care | Payer: Self-pay | Source: Ambulatory Visit | Attending: Cardiovascular Disease

## 2014-04-22 ENCOUNTER — Ambulatory Visit (HOSPITAL_COMMUNITY)
Admission: RE | Admit: 2014-04-22 | Discharge: 2014-04-22 | Disposition: A | Discharge status: Home / Self Care | Payer: Managed Care, Other (non HMO) | Source: Ambulatory Visit | Attending: Cardiovascular Disease | Admitting: Cardiovascular Disease

## 2014-04-22 DIAGNOSIS — Z7901 Long term (current) use of anticoagulants: Secondary | ICD-10-CM | POA: Insufficient documentation

## 2014-04-22 DIAGNOSIS — Z79899 Other long term (current) drug therapy: Secondary | ICD-10-CM | POA: Insufficient documentation

## 2014-04-22 DIAGNOSIS — E785 Hyperlipidemia, unspecified: Secondary | ICD-10-CM | POA: Insufficient documentation

## 2014-04-22 DIAGNOSIS — Z7982 Long term (current) use of aspirin: Secondary | ICD-10-CM | POA: Insufficient documentation

## 2014-04-22 DIAGNOSIS — Z95820 Peripheral vascular angioplasty status with implants and grafts: Secondary | ICD-10-CM | POA: Diagnosis not present

## 2014-04-22 DIAGNOSIS — I10 Essential (primary) hypertension: Secondary | ICD-10-CM | POA: Diagnosis not present

## 2014-04-22 DIAGNOSIS — I70212 Atherosclerosis of native arteries of extremities with intermittent claudication, left leg: Secondary | ICD-10-CM | POA: Insufficient documentation

## 2014-04-22 DIAGNOSIS — Z8249 Family history of ischemic heart disease and other diseases of the circulatory system: Secondary | ICD-10-CM | POA: Diagnosis not present

## 2014-04-22 DIAGNOSIS — Z720 Tobacco use: Secondary | ICD-10-CM | POA: Insufficient documentation

## 2014-04-22 DIAGNOSIS — Z01818 Encounter for other preprocedural examination: Secondary | ICD-10-CM

## 2014-04-22 DIAGNOSIS — I70219 Atherosclerosis of native arteries of extremities with intermittent claudication, unspecified extremity: Secondary | ICD-10-CM

## 2014-04-22 DIAGNOSIS — I701 Atherosclerosis of renal artery: Secondary | ICD-10-CM | POA: Diagnosis present

## 2014-04-22 HISTORY — PX: RENAL ANGIOGRAM: SHX5509

## 2014-04-22 SURGERY — RENAL ANGIOGRAM
Anesthesia: LOCAL | Laterality: Left

## 2014-04-22 MED ORDER — SODIUM CHLORIDE 0.9 % IJ SOLN: 3.0000 mL | INTRAMUSCULAR | Status: DC | PRN

## 2014-04-22 MED ORDER — ASPIRIN 81 MG PO CHEW: 81.0000 mg | CHEWABLE_TABLET | ORAL | Status: DC

## 2014-04-22 MED ORDER — ACETAMINOPHEN 325 MG PO TABS: 650.0000 mg | ORAL_TABLET | ORAL | Status: DC | PRN

## 2014-04-22 MED ORDER — ONDANSETRON HCL 4 MG/2ML IJ SOLN: 4.0000 mg | Freq: Four times a day (QID) | INTRAMUSCULAR | Status: DC | PRN

## 2014-04-22 MED ORDER — LIDOCAINE HCL (PF) 1 % IJ SOLN
INTRAMUSCULAR | Status: AC
  Filled 2014-04-22: qty 30

## 2014-04-22 MED ORDER — HEPARIN (PORCINE) IN NACL 2-0.9 UNIT/ML-% IJ SOLN
INTRAMUSCULAR | Status: AC
  Filled 2014-04-22: qty 1000

## 2014-04-22 MED ORDER — SODIUM CHLORIDE 0.9 % IV SOLN
INTRAVENOUS | Status: DC
  Administered 2014-04-22: 10:00:00 via INTRAVENOUS

## 2014-04-22 MED ORDER — SODIUM CHLORIDE 0.9 % IV SOLN: INTRAVENOUS | Status: AC

## 2014-04-22 NOTE — Interval H&P Note (Signed)
History and Physical Interval Note:  04/22/2014 12:58 PM  Connie Farrell  has presented today for surgery, with the diagnosis of renal artery stenosis  The various methods of treatment have been discussed with the patient and family. After consideration of risks, benefits and other options for treatment, the patient has consented to  Procedure(s): RENAL ANGIOGRAM (N/A) as a surgical intervention .  The patient's history has been reviewed, patient examined, no change in status, stable for surgery.  I have reviewed the patient's chart and labs.  Questions were answered to the patient's satisfaction.     Lorretta Harp

## 2014-04-22 NOTE — H&P (View-Only) (Signed)
04/16/2014 Connie Farrell   [birth year]  [id number]  Primary Physician Delphina Cahill, MD Primary Cardiologist: Lorretta Harp MD Renae Gloss   HPI:  The patient is a 53 year old, thin appearing, married, Caucasian female, mother of two, grandmother to one grandchild, referred to me by [name] for peripheral vascular evaluation.   Her cardiac risk factor profile is positive for a 30 pack year of tobacco abuse, currently smoking one pack per day. Recalcitrant to risk factor modification. I did counsel her for greater than three minutes. Her other problems include hypertension and hyperlipidemia, as well as a strong family history for heart disease. She had a negative Myoview performed by [name] at [facility]. She did complain of claudication with Dopplers that suggested a mildly diminished left ABI performed at [facility] and confirmed in our lab suggesting of high grade left common iliac artery stenosis. I performed an angiogram on her April 04, 2012, confirming this with a 40-mm pull-back gradient. I stented her left common iliac artery with result in improvement in her left ABI from 0.82 to 0.96. Improvement in her Dopplers and claudication symptoms as well. We have been following her renal Dopplers closely which is usually has shown a significant progression of disease with decline in her portable dimension the dimension from 10.7-9.2 cm an increase in her serum creatinine from 1.2-2.0. Her primary care physician subsequently discontinued her ACE inhibitor.  Current Outpatient Prescriptions  Medication Sig Dispense Refill  . aspirin EC 81 MG tablet Take 81 mg by mouth daily.      Marland Kitchen atorvastatin (LIPITOR) 40 MG tablet Take 40 mg by mouth daily.       . clopidogrel (PLAVIX) 75 MG tablet TAKE ONE TABLET DAILY WITH BREAKFAST.  30 tablet  6  . cyclobenzaprine (FLEXERIL) 10 MG tablet Take 5-10 mg by mouth 3 (three) times daily as needed for muscle spasms.       .  Cyclobenzaprine HCl POWD       . escitalopram (LEXAPRO) 10 MG tablet Take 10 mg by mouth Daily.      . ferrous sulfate 325 (65 FE) MG tablet Take 325 mg by mouth daily with breakfast.      . HYDROcodone-acetaminophen (NORCO/VICODIN) 5-325 MG per tablet Take 1 tablet by mouth every 8 (eight) hours as needed for moderate pain.      . pantoprazole (PROTONIX) 40 MG tablet Take 1 tablet (40 mg total) by mouth 2 (two) times daily.  60 tablet  5   No current facility-administered medications for this visit.    Allergies  Allergen Reactions  . Latex Rash    Blisters, rash  . Codeine Nausea And Vomiting    History   Social History  . Marital Status: Married    Spouse Name: N/A    Number of Children: N/A  . Years of Education: N/A   Occupational History  . [employer]   Social History Main Topics  . Smoking status: Current Every Day Smoker -- 0.75 packs/day for 36 years    Types: Cigarettes  . Smokeless tobacco: Never Used  . Alcohol Use: No  . Drug Use: No  . Sexual Activity: No   Other Topics Concern  . Not on file   Social History Narrative  . No narrative on file     Review of Systems: General: negative for chills, fever, night sweats or weight changes.  Cardiovascular: negative for chest pain,  dyspnea on exertion, edema, orthopnea, palpitations, paroxysmal nocturnal dyspnea or shortness of breath Dermatological: negative for rash Respiratory: negative for cough or wheezing Urologic: negative for hematuria Abdominal: negative for nausea, vomiting, diarrhea, bright red blood per rectum, melena, or hematemesis Neurologic: negative for visual changes, syncope, or dizziness All other systems reviewed and are otherwise negative except as noted above.    Blood pressure 121/80, pulse 82, height 5\' 5"  (1.651 m), weight 119 lb (53.978 kg).  General appearance: alert and no distress Neck: no adenopathy, no carotid bruit, no JVD, supple, symmetrical, trachea  midline and thyroid not enlarged, symmetric, no tenderness/mass/nodules Lungs: clear to auscultation bilaterally Heart: regular rate and rhythm, S1, S2 normal, no murmur, click, rub or gallop Extremities: extremities normal, atraumatic, no cyanosis or edema  EKG normal sinus rhythm at 78 without ST or T wave changes  ASSESSMENT AND PLAN:   PAD (peripheral artery disease) History of left common iliac artery stenting by myself 04/04/12. Followup Dopplers have revealed this to be widely patent. She denies claudication.  HTN (hypertension) Controlled on current medications. Her primary care physician recently discontinued her ACE inhibitor because of an increase in her serum creatinine.  Renal artery stenosis History of 80% renal artery stenosis documented by angiography 2 years ago. Following this document ultrasound which most recently has shown significant progression 04/02/14 with decline in her left pole-to-pole dimensions from 10.7-9.2 cm. I suspect that her left renal artery stenosis is now occlusive. I'm going to schedule her to have angiography and intervention for renal preservation.  HLD (hyperlipidemia) On statin therapy followed by her PCP      Lorretta Harp MD [facility], [facility] 04/16/2014 10:36 AM

## 2014-04-22 NOTE — CV Procedure (Signed)
Connie Farrell is a 53 y.o. female    [id number] LOCATION:  FACILITY: [facility]  PHYSICIAN: Quay Burow, M.D. April 04, 1961   DATE OF PROCEDURE:  04/22/2014  DATE OF DISCHARGE:     PV Angiogram/Intervention    History obtained from chart review.The patient is a 53 year old, thin appearing, married, Caucasian female, mother of two, grandmother to one grandchild, referred to me by [name] for peripheral vascular evaluation.   Her cardiac risk factor profile is positive for a 30 pack year of tobacco abuse, currently smoking one pack per day. Recalcitrant to risk factor modification. I did counsel her for greater than three minutes. Her other problems include hypertension and hyperlipidemia, as well as a strong family history for heart disease. She had a negative Myoview performed by [name] at [facility]. 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 I performed an angiogram on her April 04, 2012, confirming this with a 40-mm pull-back gradient. I stented her left common iliac artery with result in improvement in her left ABI from 0.82 to 0.96. Improvement in her Dopplers and claudication symptoms as well. We have been following her renal Dopplers closely which is usually has shown a significant progression of disease with decline in her pole to pole dimension dimension from 10.7-9.2 cm an increase in her serum creatinine from 1.2-2.0. Her primary care physician subsequently discontinued her ACE inhibitor. She presents today for angiography and potential intervention of her left renal artery for renal preservation.    PROCEDURE DESCRIPTION:   The patient was brought to the second floor  Cardiac cath lab in the postabsorptive state. She was not premedicated . Her right groinwas prepped and shaved in usual sterile  fashion. Xylocaine 1% was used  for local anesthesia. A 5 French sheath was inserted into the right common femoral artery using standard Seldinger technique.a 5 French pigtail catheter was placed in the mid abdominal aorta. Midstream and distal abdominal aortography along with bilateral iliac angiography was performed. A short 5 [place] crossover catheter was used for selective left renal angiography. Omnipaque dye was used for the entirety of the case. Retrograde aortic pressure was monitored during the case. A pullback gradient was performed across the ostium of the left renal artery (20 mmHg).  HEMODYNAMICS:    AO SYSTOLIC/AO DIASTOLIC: 545/62   Angiographic Data:   1: Abdominal aortogram-the left renal artery had a 50 a 60% hypodense ostial/proximal stenosis. The right renal artery was widely patent. The infrarenal abdominal aorta was free of significant disease.  2: Bilateral iliac angiogram-the stent at the origin of the left common iliac artery was widely patent. The  remainder of the iliac arteries appear free of significant disease.  IMPRESSION:moderate left renal artery stenosis with only a 20 mm gradient, widely patent left common iliac artery stent. Will continue to follow this noninvasively. A right femoral arteriogram was performed via the SideArm sheath and a MYNX  closure device was then used to obtain hemostasis. The patient will remain recumbent for 2 hours and will be hydrated, discharged him and will see me back in the office in 2-3 weeks for followup   Lorretta Harp. MD, [facility] 04/22/2014 1:35 PM

## 2014-04-22 NOTE — Discharge Instructions (Signed)
Angiogram, Care After °Refer to this sheet in the next few weeks. These instructions provide you with information on caring for yourself after your procedure. Your health care provider may also give you more specific instructions. Your treatment has been planned according to current medical practices, but problems sometimes occur. Call your health care provider if you have any problems or questions after your procedure.  °WHAT TO EXPECT AFTER THE PROCEDURE °After your procedure, it is typical to have the following sensations: °· Minor discomfort or tenderness and a small bump at the catheter insertion site. The bump should usually decrease in size and tenderness within 1 to 2 weeks. °· Any bruising will usually fade within 2 to 4 weeks. °HOME CARE INSTRUCTIONS  °· You may need to keep taking blood thinners if they were prescribed for you. Take medicines only as directed by your health care provider. °· Do not apply powder or lotion to the site. °· Do not take baths, swim, or use a hot tub until your health care provider approves. °· You may shower 24 hours after the procedure. Remove the bandage (dressing) and gently wash the site with plain soap and water. Gently pat the site dry. °· Inspect the site at least twice daily. °· Limit your activity for the first 48 hours. Do not bend, squat, or lift anything over 20 lb (9 kg) or as directed by your health care provider. °· Plan to have someone take you home after the procedure. Follow instructions about when you can drive or return to work. °SEEK MEDICAL CARE IF: °· You get light-headed when standing up. °· You have drainage (other than a small amount of blood on the dressing). °· You have chills. °· You have a fever. °· You have redness, warmth, swelling, or pain at the insertion site. °SEEK IMMEDIATE MEDICAL CARE IF:  °· You develop chest pain or shortness of breath, feel faint, or pass out. °· You have bleeding, swelling larger than a walnut, or drainage from the  catheter insertion site. °· You develop pain, discoloration, coldness, or severe bruising in the leg or arm that held the catheter. °· You have heavy bleeding from the site. If this happens, hold pressure on the site and call 911. °MAKE SURE YOU: °· Understand these instructions. °· Will watch your condition. °· Will get help right away if you are not doing well or get worse. °Document Released: 01/21/2005 Document Revised: 11/19/2013 Document Reviewed: 11/27/2012 °Exit[employer]® Patient Information ©2015 [employer]. This information is not intended to replace advice given to you by your health care provider. Make sure you discuss any questions you have with your health care provider. ° °

## 2014-05-01 ENCOUNTER — Telehealth: Payer: Self-pay | Admitting: Cardiovascular Disease

## 2014-05-01 NOTE — Telephone Encounter (Signed)
Not usual, if still drains after using pressure dressing [place]

## 2014-05-01 NOTE — Telephone Encounter (Signed)
Patient had PV angiogram on 10/5 with [name]. She has been doing fine but noticed yesterday after walking [place] beach[place] that she had some serosanguinous fluid from the cath site). She states the site is mildly tender to touch, but NOT red and no s/sx of infection. She wanted to know if this is normal?  Message routed to Mickel Baas, NP to advise

## 2014-05-01 NOTE — Telephone Encounter (Signed)
Spoke with patient and provided recommendations per Mickel Baas, NP - patient voiced understanding

## 2014-05-01 NOTE — Telephone Encounter (Signed)
Please call,have some concerns about her incision. This incision is from where she had a procedure last week.

## 2014-05-10 ENCOUNTER — Ambulatory Visit: Payer: Managed Care, Other (non HMO) | Admitting: Internal Medicine

## 2014-05-16 ENCOUNTER — Telehealth (HOSPITAL_COMMUNITY): Payer: Self-pay | Admitting: *Deleted

## 2014-05-16 NOTE — Telephone Encounter (Signed)
Pt to be added to [facility] sched. On Monday for swelling in her face to her toes on the rt. Side; pt. Had renal angio. On 04/22/14 per Curt Bears

## 2014-05-16 NOTE — Telephone Encounter (Signed)
Pt c/o swelling in rt leg, under rt eye , rt side of her face and rt hip down to toes. Please call after 2:30 to advise pt on what to do

## 2014-05-20 ENCOUNTER — Ambulatory Visit (INDEPENDENT_AMBULATORY_CARE_PROVIDER_SITE_OTHER): Payer: Managed Care, Other (non HMO) | Admitting: Cardiology

## 2014-05-20 ENCOUNTER — Encounter: Payer: Self-pay | Admitting: Cardiology

## 2014-05-20 VITALS — BP 142/78 | HR 78 | Ht 65.0 in | Wt 129.3 lb

## 2014-05-20 DIAGNOSIS — R079 Chest pain, unspecified: Secondary | ICD-10-CM

## 2014-05-20 DIAGNOSIS — Z720 Tobacco use: Secondary | ICD-10-CM

## 2014-05-20 DIAGNOSIS — R002 Palpitations: Secondary | ICD-10-CM | POA: Insufficient documentation

## 2014-05-20 MED ORDER — METOPROLOL TARTRATE 12.5 MG HALF TABLET: 12.5000 mg | ORAL_TABLET | Freq: Two times a day (BID) | ORAL | Status: DC | PRN

## 2014-05-20 NOTE — Assessment & Plan Note (Signed)
Lt RAS- 50%, continued medical Rx and surveillance.

## 2014-05-20 NOTE — Progress Notes (Signed)
05/20/2014 Connie Farrell   20-Aug-1960  [id number]  Primary Physicia Delphina Cahill, MD Primary Cardiologist: Dr Gwenlyn Found  HPI:  53 year old, thin appearing, married, Caucasian female, mother of two, grandmother to one grandchild, She has a history of smoking, HTN, dyslipidemia, and a FM Hx of CAD. She has had previous Nuclear study at [facility] that was negative.           She has seen Dr Gwenlyn Found for claudication in the past. She had Lt CIA stenting 04/04/2012. At that time she also had 80% Lt RAS. She recently had increased velocities noted on RA doppler and she was admitted for PV angiogram. This was done 04/22/2014 and showed her Lt CIA stent to be patent and her Lt renal artery to be 50-60% narrowed with a 20 mmHg gradient. The plan is for continued medical Rx.               Since her PVA she had noted some Rt face and Rt foot swelling but this is not present today. She has otherwise done well.      Current Outpatient Prescriptions  Medication Sig Dispense Refill  . aspirin EC 81 MG tablet Take 81 mg by mouth daily.    Marland Kitchen atorvastatin (LIPITOR) 20 MG tablet Take 20 mg by mouth daily.    . clopidogrel (PLAVIX) 75 MG tablet TAKE ONE TABLET DAILY WITH BREAKFAST. 30 tablet 6  . cyclobenzaprine (FLEXERIL) 10 MG tablet Take 5-10 mg by mouth 3 (three) times daily as needed for muscle spasms.     Marland Kitchen escitalopram (LEXAPRO) 10 MG tablet Take 10 mg by mouth Daily.    . ferrous sulfate 325 (65 FE) MG tablet Take 325 mg by mouth 2 (two) times daily with a meal.     . HYDROcodone-acetaminophen (NORCO/VICODIN) 5-325 MG per tablet Take 1 tablet by mouth every 8 (eight) hours as needed for moderate pain.    . metoprolol tartrate (LOPRESSOR) 12.5 mg TABS tablet Take 0.5 tablets (12.5 mg total) by mouth 2 (two) times daily as needed. 30 tablet 5  . pantoprazole (PROTONIX) 40 MG tablet TAKE ONE TABLET BY MOUTH TWICE DAILY. 60 tablet 11   No current facility-administered medications for this visit.     Allergies  Allergen Reactions  . Latex Rash    Blisters, rash  . Codeine Nausea And Vomiting    History   Social History  . Marital Status: Married    Spouse Name: N/A    Number of Children: N/A  . Years of Education: N/A   Occupational History  . [employer]   Social History Main Topics  . Smoking status: Current Every Day Smoker -- 0.75 packs/day for 36 years    Types: Cigarettes  . Smokeless tobacco: Never Used  . Alcohol Use: No  . Drug Use: No  . Sexual Activity: No   Other Topics Concern  . Not on file   Social History Narrative     Review of Systems: Recent cough General: negative for chills, fever, night sweats or weight changes.  Cardiovascular: negative for chest pain, dyspnea on exertion, edema, orthopnea, palpitations, paroxysmal nocturnal dyspnea or shortness of breath Dermatological: negative for rash Respiratory: negative for cough or wheezing Urologic: negative for hematuria Abdominal: negative for nausea, vomiting, diarrhea, bright red blood per rectum, melena, or hematemesis Neurologic: negative for visual changes, syncope, or dizziness All other systems reviewed and are otherwise negative except as noted above.  Blood pressure 142/78, pulse 78, height 5\' 5"  (1.651 m), weight 129 lb 4.8 oz (58.65 kg).  General appearance: alert, cooperative and no distress Lungs: scattered rhonchi Heart: regularly irregular rhythm Extremities: no edema  EKG NSR  ASSESSMENT AND PLAN:   PAD (peripheral artery disease) Lt CIA PTA 3 yrs ago, patent at recent angiogram 04/22/14  Renal artery stenosis Lt RAS- 50%, continued medical Rx and surveillance.  Tobacco abuse Continues to smoke 1 PPD  Heart palpitations Occasional palpations, no sustained tachycardia.   PLAN  F/U Dr Gwenlyn Found 6 months. We discussed smoking cessation.  Karima Carrell KPA-C 05/20/2014 2:30 PM

## 2014-05-20 NOTE — Patient Instructions (Signed)
Take Lopressor 25mg  1/2 tablet twice a day as needed for palpitations.  Your physician recommends that you schedule a follow-up appointment in: 6 months with [name].

## 2014-05-20 NOTE — Assessment & Plan Note (Addendum)
Occasional palpations, no sustained tachycardia.

## 2014-05-20 NOTE — Assessment & Plan Note (Signed)
Lt CIA PTA 3 yrs ago, patent at recent angiogram 04/22/14

## 2014-05-20 NOTE — Assessment & Plan Note (Signed)
Continues to smoke 1 PPD

## 2014-05-21 ENCOUNTER — Telehealth: Payer: Self-pay | Admitting: Cardiology

## 2014-05-21 MED ORDER — METOPROLOL TARTRATE 25 MG PO TABS: 12.5000 mg | ORAL_TABLET | Freq: Two times a day (BID) | ORAL | Status: DC | PRN

## 2014-05-21 NOTE — Telephone Encounter (Signed)
Rx was sent to pharmacy electronically. Patient notified.

## 2014-05-21 NOTE — Telephone Encounter (Signed)
Pt saw [facility] yesterday. He was supposed to have called in a prescription to [facility],it is not there.

## 2014-05-29 ENCOUNTER — Encounter: Payer: Self-pay | Admitting: *Deleted

## 2014-05-30 ENCOUNTER — Encounter (HOSPITAL_COMMUNITY): Payer: Managed Care, Other (non HMO) | Attending: Hematology and Oncology

## 2014-05-30 ENCOUNTER — Encounter (HOSPITAL_COMMUNITY): Payer: Self-pay

## 2014-05-30 VITALS — BP 141/90 | HR 85 | Temp 98.4°F | Resp 18 | Ht 62.25 in | Wt 125.8 lb

## 2014-05-30 DIAGNOSIS — K909 Intestinal malabsorption, unspecified: Secondary | ICD-10-CM

## 2014-05-30 DIAGNOSIS — K222 Esophageal obstruction: Secondary | ICD-10-CM

## 2014-05-30 DIAGNOSIS — D500 Iron deficiency anemia secondary to blood loss (chronic): Secondary | ICD-10-CM

## 2014-05-30 DIAGNOSIS — Z720 Tobacco use: Secondary | ICD-10-CM

## 2014-05-30 DIAGNOSIS — J449 Chronic obstructive pulmonary disease, unspecified: Secondary | ICD-10-CM

## 2014-05-30 DIAGNOSIS — I739 Peripheral vascular disease, unspecified: Secondary | ICD-10-CM

## 2014-05-30 DIAGNOSIS — E611 Iron deficiency: Secondary | ICD-10-CM | POA: Insufficient documentation

## 2014-05-30 DIAGNOSIS — K219 Gastro-esophageal reflux disease without esophagitis: Secondary | ICD-10-CM

## 2014-05-30 LAB — COMPREHENSIVE METABOLIC PANEL
ALT: 9 U/L (ref 0–35)
AST: 12 U/L (ref 0–37)
Albumin: 4 g/dL (ref 3.5–5.2)
Alkaline Phosphatase: 110 U/L (ref 39–117)
Anion gap: 13 (ref 5–15)
BUN: 17 mg/dL (ref 6–23)
CALCIUM: 9.4 mg/dL (ref 8.4–10.5)
CO2: 24 meq/L (ref 19–32)
Chloride: 105 mEq/L (ref 96–112)
Creatinine, Ser: 1.27 mg/dL — ABNORMAL HIGH (ref 0.50–1.10)
GFR, EST AFRICAN AMERICAN: 56 mL/min — AB (ref >90)
GFR, EST NON AFRICAN AMERICAN: 48 mL/min — AB (ref >90)
GLUCOSE: 101 mg/dL — AB (ref 70–99)
Potassium: 3.8 mEq/L (ref 3.7–5.3)
Sodium: 142 mEq/L (ref 137–147)
Total Bilirubin: 0.2 mg/dL — ABNORMAL LOW (ref 0.3–1.2)
Total Protein: 7.1 g/dL (ref 6.0–8.3)

## 2014-05-30 LAB — RETICULOCYTES
RBC.: 4.18 MIL/uL (ref 3.87–5.11)
RETIC CT PCT: 1.2 % (ref 0.4–3.1)
Retic Count, Absolute: 50.2 10*3/uL (ref 19.0–186.0)

## 2014-05-30 LAB — CBC WITH DIFFERENTIAL/PLATELET
BASOS ABS: 0 10*3/uL (ref 0.0–0.1)
Basophils Relative: 1 % (ref 0–1)
EOS PCT: 3 % (ref 0–5)
Eosinophils Absolute: 0.1 10*3/uL (ref 0.0–0.7)
HEMATOCRIT: 40.8 % (ref 36.0–46.0)
Hemoglobin: 13.4 g/dL (ref 12.0–15.0)
LYMPHS PCT: 26 % (ref 12–46)
Lymphs Abs: 1.3 10*3/uL (ref 0.7–4.0)
MCH: 32.1 pg (ref 26.0–34.0)
MCHC: 32.8 g/dL (ref 30.0–36.0)
MCV: 97.6 fL (ref 78.0–100.0)
MONO ABS: 0.3 10*3/uL (ref 0.1–1.0)
Monocytes Relative: 6 % (ref 3–12)
Neutro Abs: 3.3 10*3/uL (ref 1.7–7.7)
Neutrophils Relative %: 64 % (ref 43–77)
Platelets: 187 10*3/uL (ref 150–400)
RBC: 4.18 MIL/uL (ref 3.87–5.11)
RDW: 14.4 % (ref 11.5–15.5)
WBC: 5 10*3/uL (ref 4.0–10.5)

## 2014-05-30 LAB — FERRITIN: FERRITIN: 32 ng/mL (ref 10–291)

## 2014-05-30 LAB — IRON AND TIBC
Iron: 37 ug/dL — ABNORMAL LOW (ref 42–135)
Saturation Ratios: 12 % — ABNORMAL LOW (ref 20–55)
TIBC: 308 ug/dL (ref 250–470)
UIBC: 271 ug/dL (ref 125–400)

## 2014-05-30 NOTE — Patient Instructions (Signed)
[facility] Discharge Instructions  RECOMMENDATIONS MADE BY THE CONSULTANT AND ANY TEST RESULTS WILL BE SENT TO YOUR REFERRING PHYSICIAN.  EXAM FINDINGS BY THE PHYSICIAN TODAY AND SIGNS OR SYMPTOMS TO REPORT TO CLINIC OR PRIMARY PHYSICIAN: Exam and findings as discussed by Dr. Barnet Glasgow.  You are losing iron and are not absorbing it from the foods you eat.  We will give you feraheme infusions weekly for 2 weeks.  Will check some blood work today.   INSTRUCTIONS/FOLLOW-UP: Feraheme infusions 11/17 & 11/24. Labs and office visit in 6 weeks.  Thank you for choosing [facility] to provide your oncology and hematology care.  To afford each patient quality time with our providers, please arrive at least 15 minutes before your scheduled appointment time.  With your help, our goal is to use those 15 minutes to complete the necessary work-up to ensure our physicians have the information they need to help with your evaluation and healthcare recommendations.    Effective January 1st, 2014, we ask that you re-schedule your appointment with our physicians should you arrive 10 or more minutes late for your appointment.  We strive to give you quality time with our providers, and arriving late affects you and other patients whose appointments are after yours.    Again, thank you for choosing [facility].  Our hope is that these requests will decrease the amount of time that you wait before being seen by our physicians.       _____________________________________________________________  Should you have questions after your visit to [facility], please contact our office at [phone] between the hours of 8:30 a.m. and 4:30 p.m.  Voicemails left after 4:30 p.m. will not be returned until the following business day.  For prescription refill requests, have your pharmacy contact our office with your prescription refill request.     _______________________________________________________________  We hope that we have given you very good care.  You may receive a patient satisfaction survey in the mail, please complete it and return it as soon as possible.  We value your feedback!  _______________________________________________________________  Have you asked about our STAR program?  STAR stands for Survivorship Training and Rehabilitation, and this is a nationally recognized cancer care program that focuses on survivorship and rehabilitation.  Cancer and cancer treatments may cause problems, such as, pain, making you feel tired and keeping you from doing the things that you need or want to do. Cancer rehabilitation can help. Our goal is to reduce these troubling effects and help you have the best quality of life possible.  You may receive a survey from a nurse that asks questions about your current state of health.  Based on the survey results, all eligible patients will be referred to the [facility] program for an evaluation so we can better serve you!  A frequently asked questions sheet is available upon request.  Iron Deficiency Anemia Anemia is a condition in which there are less red blood cells or hemoglobin in the blood than normal. Hemoglobin is the part of red blood cells that carries oxygen. Iron deficiency anemia is anemia caused by too little iron. It is the most common type of anemia. It may leave you tired and short of breath. CAUSES   Lack of iron in the diet.  Poor absorption of iron, as seen with intestinal disorders.  Intestinal bleeding.  Heavy periods. SIGNS AND SYMPTOMS  Mild anemia may not be noticeable. Symptoms may include:  Fatigue.  Headache.  Pale skin.  Weakness.  Tiredness.  Shortness of breath.  Dizziness.  Cold hands and feet.  Fast or irregular heartbeat. DIAGNOSIS  Diagnosis requires a thorough evaluation and physical exam by your health care provider. Blood tests are  generally used to confirm iron deficiency anemia. Additional tests may be done to find the underlying cause of your anemia. These may include:  Testing for blood in the stool (fecal occult blood test).  A procedure to see inside the colon and rectum (colonoscopy).  A procedure to see inside the esophagus and stomach (endoscopy). TREATMENT  Iron deficiency anemia is treated by correcting the cause of the deficiency. Treatment may involve:  Adding iron-rich foods to your diet.  Taking iron supplements. Pregnant or breastfeeding women need to take extra iron because their normal diet usually does not provide the required amount.  Taking vitamins. Vitamin C improves the absorption of iron. Your health care provider may recommend that you take your iron tablets with a glass of orange juice or vitamin C supplement.  Medicines to make heavy menstrual flow lighter.  Surgery. HOME CARE INSTRUCTIONS   Take iron as directed by your health care provider.  If you cannot tolerate taking iron supplements by mouth, talk to your health care provider about taking them through a vein (intravenously) or an injection into a muscle.  For the best iron absorption, iron supplements should be taken on an empty stomach. If you cannot tolerate them on an empty stomach, you may need to take them with food.  Do not drink milk or take antacids at the same time as your iron supplements. Milk and antacids may interfere with the absorption of iron.  Iron supplements can cause constipation. Make sure to include fiber in your diet to prevent constipation. A stool softener may also be recommended.  Take vitamins as directed by your health care provider.  Eat a diet rich in iron. Foods high in iron include liver, lean beef, whole-grain bread, eggs, dried fruit, and dark green leafy vegetables. SEEK IMMEDIATE MEDICAL CARE IF:   You faint. If this happens, do not drive. Call your local emergency services (911 in [place]  if no other help is available.  You have chest pain.  You feel nauseous or vomit.  You have severe or increased shortness of breath with activity.  You feel weak.  You have a rapid heartbeat.  You have unexplained sweating.  You become light-headed when getting up from a chair or bed. MAKE SURE YOU:   Understand these instructions.  Will watch your condition.  Will get help right away if you are not doing well or get worse. Document Released: 07/02/2000 Document Revised: 07/10/2013 Document Reviewed: 03/12/2013 [facility] Patient Information 2015 [place], [place]. This information is not intended to replace advice given to you by your health care provider. Make sure you discuss any questions you have with your health care provider. Ferumoxytol injection What is this medicine? FERUMOXYTOL is an iron complex. Iron is used to make healthy red blood cells, which carry oxygen and nutrients throughout the body. This medicine is used to treat iron deficiency anemia in people with chronic kidney disease. This medicine may be used for other purposes; ask your health care provider or pharmacist if you have questions. COMMON BRAND NAME(S): Feraheme What should I tell my health care provider before I take this medicine? They need to know if you have any of these conditions: -anemia not caused by low iron levels -high  levels of iron in the blood -magnetic resonance imaging (MRI) test scheduled -an unusual or allergic reaction to iron, other medicines, foods, dyes, or preservatives -pregnant or trying to get pregnant -breast-feeding How should I use this medicine? This medicine is for injection into a vein. It is given by a health care professional in a hospital or clinic setting. Talk to your pediatrician regarding the use of this medicine in children. Special care may be needed. Overdosage: If you think you've taken too much of this medicine contact a poison control center or emergency room  at once. Overdosage: If you think you have taken too much of this medicine contact a poison control center or emergency room at once. NOTE: This medicine is only for you. Do not share this medicine with others. What if I miss a dose? It is important not to miss your dose. Call your doctor or health care professional if you are unable to keep an appointment. What may interact with this medicine? This medicine may interact with the following medications: -other iron products This list may not describe all possible interactions. Give your health care provider a list of all the medicines, herbs, non-prescription drugs, or dietary supplements you use. Also tell them if you smoke, drink alcohol, or use illegal drugs. Some items may interact with your medicine. What should I watch for while using this medicine? Visit your doctor or healthcare professional regularly. Tell your doctor or healthcare professional if your symptoms do not start to get better or if they get worse. You may need blood work done while you are taking this medicine. You may need to follow a special diet. Talk to your doctor. Foods that contain iron include: whole grains/cereals, dried fruits, beans, or peas, leafy green vegetables, and organ meats (liver, kidney). What side effects may I notice from receiving this medicine? Side effects that you should report to your doctor or health care professional as soon as possible: -allergic reactions like skin rash, itching or hives, swelling of the face, lips, or tongue -breathing problems -changes in blood pressure -feeling faint or lightheaded, falls -fever or chills -flushing, sweating, or hot feelings -swelling of the ankles or feet Side effects that usually do not require medical attention (Report these to your doctor or health care professional if they continue or are bothersome.): -diarrhea -headache -nausea, vomiting -stomach pain This list may not describe all possible side  effects. Call your doctor for medical advice about side effects. You may report side effects to FDA at 1-800-FDA-1088. Where should I keep my medicine? This drug is given in a hospital or clinic and will not be stored at home. NOTE: This sheet is a summary. It may not cover all possible information. If you have questions about this medicine, talk to your doctor, pharmacist, or health care provider.  2015, [employer]vier/[employer] Standard. (2012-02-18 15:23:36)

## 2014-05-30 NOTE — Progress Notes (Signed)
Harl Favor presented for labwork. Labs per MD order drawn via Peripheral Line 21 gauge needle inserted in left AC  Good blood return present. Procedure without incident.  Needle removed intact. Patient tolerated procedure well.

## 2014-05-30 NOTE — Progress Notes (Signed)
Esto A. Barnet Glasgow, M.D.  NEW PATIENT EVALUATION   Name: Connie Farrell Date: 05/30/2014 MRN: [id number] DOB: [birth year]  PCP: Delphina Cahill, MD   REFERRING PHYSICIAN: Delphina Cahill, MD  REASON FOR REFERRAL: Iron deficiency     HISTORY OF PRESENT ILLNESS:Tashina C Reyburn is a 53 y.o. female who is referred by her family physician and gastroenterologist for evaluation of iron deficiency. Primary problem is fatigue with craving for ice and cracked lips with brittle fingernails. She denies any melena, hematochezia, hematuria, vaginal bleeding, epistaxis, or hemoptysis. Urine is normal color. She denies easy satiety. 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 Appetite has been good with no nausea, vomiting, diarrhea, constipation, lower extremity swelling or redness, but with episodes of shortness of breath on mild exertion in addition to PND. She denies any skin rash, worsening joint pain, headache, or seizures.   PAST MEDICAL HISTORY:  has a past medical history of Hypertension; Seasonal allergies; Neck pain, chronic; Hyperlipemia; PAD (peripheral artery disease); Exertional dyspnea; GERD (gastroesophageal reflux disease); Arthritis; Anxiety; Tobacco abuse; and Renal artery stenosis.     PAST SURGICAL HISTORY: Past Surgical History  Procedure Laterality Date  . Anterior cervical decomp/discectomy fusion  ~ 04/2006    C6, 7, T1  . Tubal ligation  1990  . Peripheral arterial stent graft  04/04/2012  . Laparoscopic appendectomy N/A 09/10/2013    Procedure: APPENDECTOMY LAPAROSCOPIC;  Surgeon: Jamesetta So, MD;  Location: [facility];  Service: General;  Laterality: N/A;  . Colonoscopy N/A 02/26/2014    Adequate preparation. Minimal anal canal hemorrhoids;otherwise, normal rectumdiminutive polyps in the middescending segment; otherwise, the remainder of colonic mucosaappeared  normal  . Esophagogastroduodenoscopy N/A 02/26/2014    RMR:  . Maloney dilation N/A 02/26/2014    Procedure: Venia Minks DILATION;  Surgeon: Daneil Dolin, MD;  Location: [facility] SUITE;  Service: Endoscopy;  Laterality: N/A;     CURRENT MEDICATIONS: has a current medication list which includes the following prescription(s): aspirin ec, atorvastatin, clopidogrel, cyclobenzaprine, diphenhydramine, escitalopram, hydrocodone-acetaminophen, pantoprazole, ferrous sulfate, and metoprolol tartrate.   ALLERGIES: Latex and Codeine   SOCIAL HISTORY:  reports that she has been smoking Cigarettes.  She has a 27 pack-year smoking history. She has never used smokeless tobacco. She reports that she does not drink alcohol or use illicit drugs.   FAMILY HISTORY: family history includes Arthritis in an other family member; Asthma in an other family member; Cancer in her father; Colon cancer in an other family member; Heart disease in her father, mother, and other; Leukemia in her father.    REVIEW OF SYSTEMS:  Other than that discussed above is noncontributory.    PHYSICAL EXAM:  height is 5' 2.25" (1.581 m) and weight is 125 lb 12.8 oz (57.063 kg). Her oral temperature is 98.4 F (36.9 C). Her blood pressure is 141/90 and her pulse is 85. Her respiration is 18 and oxygen saturation is 98%.    GENERAL:alert, no distress and comfortable SKIN: skin color, texture, turgor are normal, no rashes or significant lesions EYES: normal, Conjunctiva are pink and non-injected, sclera clear OROPHARYNX:no exudate, no erythema and lips, buccal mucosa, and tongue normal  NECK: supple, thyroid normal size, non-tender, without nodularity CHEST: increased AP diameter with no breast masses. LYMPH:  no palpable lymphadenopathy in the cervical, axillary or inguinal LUNGS: clear to auscultation and percussion with normal breathing effort  HEART: regular rate & rhythm and no murmurs ABDOMEN:abdomen soft, non-tender and normal  bowel sounds. No Avastin or megaly, ascites, or CVA tenderness. MUSCULOSKELETALl:no cyanosis of digits, no clubbing or edema  NEURO: alert & oriented x 3 with fluent speech, no focal motor/sensory deficits    LABORATORY DATA:   05/09/2014: WBC 4.7, hemoglobin 11.1, platelets 245,000, MCV 97.4, ANC 2.9.                     BUN 18, creatinine 1.33, GFR 50, folate 12.2, ferritin 23, B-12: 368  Office Visit on 05/30/2014  Component Date Value Ref Range Status  . WBC 05/30/2014 5.0  4.0 - 10.5 K/uL Final  . RBC 05/30/2014 4.18  3.87 - 5.11 MIL/uL Final  . Hemoglobin 05/30/2014 13.4  12.0 - 15.0 g/dL Final  . HCT 05/30/2014 40.8  36.0 - 46.0 % Final  . MCV 05/30/2014 97.6  78.0 - 100.0 fL Final  . MCH 05/30/2014 32.1  26.0 - 34.0 pg Final  . MCHC 05/30/2014 32.8  30.0 - 36.0 g/dL Final  . RDW 05/30/2014 14.4  11.5 - 15.5 % Final  . Platelets 05/30/2014 187  150 - 400 K/uL Final  . Neutrophils Relative % 05/30/2014 64  43 - 77 % Final  . Neutro Abs 05/30/2014 3.3  1.7 - 7.7 K/uL Final  . Lymphocytes Relative 05/30/2014 26  12 - 46 % Final  . Lymphs Abs 05/30/2014 1.3  0.7 - 4.0 K/uL Final  . Monocytes Relative 05/30/2014 6  3 - 12 % Final  . Monocytes Absolute 05/30/2014 0.3  0.1 - 1.0 K/uL Final  . Eosinophils Relative 05/30/2014 3  0 - 5 % Final  . Eosinophils Absolute 05/30/2014 0.1  0.0 - 0.7 K/uL Final  . Basophils Relative 05/30/2014 1  0 - 1 % Final  . Basophils Absolute 05/30/2014 0.0  0.0 - 0.1 K/uL Final  . Retic Ct Pct 05/30/2014 1.2  0.4 - 3.1 % Final  . RBC. 05/30/2014 4.18  3.87 - 5.11 MIL/uL Final  . Retic Count, Manual 05/30/2014 50.2  19.0 - 186.0 K/uL Final    Urinalysis    Component Value Date/Time   COLORURINE YELLOW 09/10/2013 1624   APPEARANCEUR CLEAR 09/10/2013 1624   LABSPEC <1.005* 09/10/2013 1624   PHURINE 5.0 09/10/2013 1624   GLUCOSEU NEGATIVE 09/10/2013 1624   HGBUR LARGE* 09/10/2013 1624   BILIRUBINUR NEGATIVE 09/10/2013 1624   KETONESUR NEGATIVE  09/10/2013 1624   PROTEINUR NEGATIVE 09/10/2013 1624   UROBILINOGEN 0.2 09/10/2013 1624   NITRITE NEGATIVE 09/10/2013 1624   LEUKOCYTESUR NEGATIVE 09/10/2013 1624      @RADIOGRAPHY : No results found.    Lorretta Harp, MD at 04/22/2014 1:35 PM    Author: Lorretta Harp, MD Service: [facility] Procedure Author Type: Physician   Filed: 04/22/2014 1:39 PM Note Time: 04/22/2014 1:35 PM Status: Signed   Editor: Lorretta Harp, MD (Physician)     Expand All Collapse All   SYBLE PICCO is a 53 y.o. female   [id number] LOCATION: FACILITY: [facility]  PHYSICIAN: Quay Burow, M.D. Dec 12, 1960   DATE OF PROCEDURE: 04/22/2014  DATE OF DISCHARGE:     PV Angiogram/Intervention    History obtained from chart review.The patient is a 53 year old, thin appearing, married, Caucasian female, mother of two, grandmother to one grandchild, referred to me by [name] for peripheral vascular evaluation.   Her cardiac risk factor profile is positive for a 30 pack year of tobacco abuse,  currently smoking one pack per day. Recalcitrant to risk factor modification. I did counsel her for greater than three minutes. Her other problems include hypertension and hyperlipidemia, as well as a strong family history for heart disease. She had a negative Myoview performed by [name] at [facility]. 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 I performed an angiogram on her April 04, 2012, confirming this with a 40-mm pull-back gradient. I stented her left common iliac artery with result in improvement in her left ABI from 0.82 to 0.96. Improvement in her Dopplers and claudication symptoms as well. We have been following her renal Dopplers closely which is usually has shown a significant progression of disease with decline in her pole to  pole dimension dimension from 10.7-9.2 cm an increase in her serum creatinine from 1.2-2.0. Her primary care physician subsequently discontinued her ACE inhibitor. She presents today for angiography and potential intervention of her left renal artery for renal preservation.    PROCEDURE DESCRIPTION:   The patient was brought to the second floor [facility] cath lab in the postabsorptive state. She was not premedicated . Her right groinwas prepped and shaved in usual sterile fashion. Xylocaine 1% was used  for local anesthesia. A 5 French sheath was inserted into the right common femoral artery using standard Seldinger technique.a 5 French pigtail catheter was placed in the mid abdominal aorta. Midstream and distal abdominal aortography along with bilateral iliac angiography was performed. A short 5 [place] crossover catheter was used for selective left renal angiography. Omnipaque dye was used for the entirety of the case. Retrograde aortic pressure was monitored during the case. A pullback gradient was performed across the ostium of the left renal artery (20 mmHg).  HEMODYNAMICS:   AO SYSTOLIC/AO DIASTOLIC: 782/95  Angiographic Data:   1: Abdominal aortogram-the left renal artery had a 50 a 60% hypodense ostial/proximal stenosis. The right renal artery was widely patent. The infrarenal abdominal aorta was free of significant disease.  2: Bilateral iliac angiogram-the stent at the origin of the left common iliac artery was widely patent. The remainder of the iliac arteries appear free of significant disease.  IMPRESSION:moderate left renal artery stenosis with only a 20 mm gradient, widely patent left common iliac artery stent. Will continue to follow this noninvasively. A right femoral arteriogram was performed via the SideArm sheath and a MYNX closure device was then used to obtain hemostasis. The patient will remain recumbent for 2 hours and will be hydrated, discharged him and  will see me back in the office in 2-3 weeks for followup   Lorretta Harp MD, [facility] 04/22/2014 1:35        ENDOSCOPY: [facility] [address] [place], [address] ENDOSCOPY PROCEDURE REPORT PATIENT: Connelly, Spruell MR#: [id number] BIRTHDATE: 1961-06-05 , 53 yrs. old GENDER: Female ENDOSCOPIST: R. Garfield Cornea, MD FACP FACG REFERRED BY: Delphina Cahill, M.D. PROCEDURE DATE: 02/26/2014 PROCEDURE: EGD with Venia Minks dilation followed by esophageal and gastric biopsy INDICATIONS: esophageal dysphagia; GERD; iron deficiency anemia INFORMED CONSENT: The risks, benefits, limitations, alternatives and imponderables have been discussed. The potential for biopsy, esophogeal dilation, etc. have also been reviewed. Questions have been answered. All parties agreeable. Please see the history and physical in the medical record for more information. MEDICATIONS: Versed 6 mg IV and Demerol 75 mg IV in divided doses.  Zofran 4 mg IV. Xylocaine gel orally DESCRIPTION OF PROCEDURE: The QV-[id number] ([id number]) endoscope was introduced through the mouth and advanced to the second portion of the duodenum without difficulty or limitations. The mucosal surfaces were surveyed very carefully during advancement of the scope and upon withdrawal. Retroflexion view of the proximal stomach and esophagogastric junction was performed. FINDINGS: Patent tubular esophagus. "2 cm tongue" of salmon colored-epithelium coming up above the GE junction. No nodularity. No esophagitis. Stomach empty. A couple of antral erosions. Some "mosaicism" appearing gastric mucosa. No ulcer or infiltrating process. Patent pylorus. Normal first and second portion of the duodenum THERAPEUTIC / DIAGNOSTIC MANEUVERS PERFORMED: A 54 French Maloney dilator was passed to full insertion easily. A look back revealed no complication related to this maneuver. Subsequently, biopsies of the abnormal-appearing distal esophagus and stomach were  taken separately. COMPLICATIONS: None IMPRESSION: Abnormal distal esophagus - query short segment Barrett's. Status post Venia Minks dilation followed by esophageal biopsy. Abnormal gastric mucosa of uncertain clinical significance-status post gastric biopsy RECOMMENDATIONS: followup on pathology. See colonoscopy report _______________________________ R. Garfield Cornea, MD FACP [facility] eSigned: R. [facility] [address] [place], [address] COLONOSCOPY PROCEDURE REPORT PATIENT: Michael, Ventresca MR#: [id number] BIRTHDATE: 09/18/1960 , 53 yrs. old GENDER: Female ENDOSCOPIST: R. Garfield Cornea, MD FACP FACG REFERRED BY: Delphina Cahill, M.D. PROCEDURE DATE: 02/26/2014 PROCEDURE: ileocolonoscopy with biopsy INDICATIONS: Iron deficiency anemia; rare paper hematochezia; Right lower quadrant abdominal pain - resolved INFORMED CONSENT: The risks, benefits, alternatives and imponderables including but not limited to bleeding, perforation as well as the possibility of a missed lesion have been reviewed. The potential for biopsy, lesion removal, etc. have also been discussed. Questions have been answered. All parties agreeable. Please see the history and physical in the medical record for more information. MEDICATIONS: Versed 9 mg IV and Demerol 75 mg IV in divided doses. Zofran 4 mg IV. DESCRIPTION OF PROCEDURE: After a digital rectal exam was performed, the EG-[id number] ([id number]) and EC-3890Li ([id number]) colonoscope was advanced from the anus through the rectum and colon to the area of the cecum, ileocecal valve and appendiceal orifice. The cecum was deeply intubated. These structures were well-seen and photographed for the record. From the level of the cecum and ileocecal valve, the scope was slowly and cautiously withdrawn. The mucosal surfaces were carefully surveyed utilizing scope tip deflection to facilitate fold flattening as needed. The scope was pulled down into the rectum where a  thorough examination including retroflexion was performed. FINDINGS: Adequate preparation. Minimal anal canal hemorrhoids; otherwise, normal rectum. (2) diminutive polyps in the mid descending segment; otherwise, the remainder of colonic mucosa appeared normal. The distal 5 cm of terminal colon mucosa also appeared THERAPEUTIC / DIAGNOSTIC MANEUVERS PERFORMED: The above-mentioned polyps were cold biopsied/removed. COMPLICATIONS: none CECAL WITHDRAWAL TIME: 9 minutes IMPRESSION: Colonic polyps-removed as described above. RECOMMENDATIONS: Followup on pathology. See EGD report. _______________________________ Lorrin Mais:  PATHOLOGY:  FINAL for SHERILEE, SMOTHERMAN ([id number]) Patient: Harl Favor Collected: 09/10/2013 Client: [facility] Accession: [id number] Received: 09/11/2013 Aviva Signs DOB: [birth year] Age: 70 Gender: F Reported: 09/12/2013 [address] Patient Ph: [phone] MRN #: [id number] Linna Hoff [place] [id number] Visit #: [id number] Chart #: Phone: [phone] Fax: CC: REPORT OF SURGICAL PATHOLOGY FINAL DIAGNOSIS Diagnosis Appendix, Other than Incidental ACUTE GANGRENOUS APPENDICITIS. Aldona Bar MD Pathologist, Electronic Signature (Case signed 09/12/2013) Specimen Gross and Clinical Information Specimen(s) Obtained: Appendix, Other than Incidental Specimen Clinical Information Pre-op: acute appendicitis; Post-op: perforated appendicitis Gross Size: Severely disrupted  appendix measuring approximately 6.5 cm in length x 0.8 cm in diameter. Serosa: Tan brown, roughened with multiple disruptions throughout the length of the specimen, extending to the resection margin. Mucosa: Tan brown. Wall: Up to 0.2 cm in thickness. Lumen: Mildly dilated and filled with a small amount of brown fecal material. Block Summary: One block submitted. (KL:caf 09/11/13) Report signed out from the following location(s) Technical Component performed at [place], [place] [address] CLIA: [id number]., Interpretation performed at [place], [place], [place] [address]. CLIA #: [id number],   for JODINE, MUCHMORE ([id number]) Patient: WAYLON, KOFFLER Collected: 02/26/2014 Client: [facility] Accession: [id number] Received: 02/26/2014 Manus Rudd DOB: Dec 03, 1960 Age: 67 Gender: F Reported: 02/28/2014 [address] Patient Ph: [phone] MRN #: [id number] Linna Hoff [place] [address] Visit #: [id number] Chart #: Phone: [phone] Fax: CC: REPORT OF SURGICAL PATHOLOGY FINAL DIAGNOSIS Diagnosis 1. Esophagus, biopsy - GASTROESOPHAGEAL MUCOSA WITH CHRONIC INFLAMMATION. - NO INTESTINAL METAPLASIA, DYSPLASIA, OR MALIGNANCY. - SEE COMMENT. 2. Stomach, biopsy - MILD CHRONIC GASTRITIS. - NEGATIVE FOR HELICOBACTER PYLORI. - NO INTESTINAL METAPLASIA, DYSPLASIA, OR MALIGNANCY. 3. Colon, polyp(s), descending - HYPERPLASTIC POLYP (X2). - NO HIGH GRADE DYSPLASIA OR MALIGNANCY. Microscopic Comment 1. Definitive goblet cells are not seen on H&E or with Alcian blue. Deeper sections are evaluated. 2. A Warthin-Starry stain is performed to determine the possibility of the presence of Helicobacter pylori. The Warthin-Starry stain is negative for organisms of Helicobacter pylori. Vicente Males MD Pathologist, Electronic Signature (Case signed 02/28/2014) Specimen Gross and Clinical Information Specimen(s) Obtained: 1. Esophagus, biopsy 2. Stomach, biopsy 3. Colon, polyp(s), descending Specimen Clinical Information 1. evaluate for Barrett's 2. evaluate for H pylori 3. Pre-op: RLQ pain and dysphagia; Post-op: antral erosions, abnormal mucosa distal esophagus, colon: polyps @ descending colon x 2; hemorrhoids 1 of 2 FINAL for Mattos, Emeline C ([id number]) Gross 1. Received in formalin are tan, soft tissue fragments that are submitted in toto. Number: two Size: 0.1 and 0.3 cm. 2. Received in formalin are tan, soft  tissue fragments that are submitted in toto. Number: two Size: 0.4 and 0.5 cm. 3. Received in formalin are tan, soft tissue fragments that are submitted in toto. Number: two Size: 0.3 and 0.4 cm. (KL:ecj 02/26/2014) Stain(s) used in Diagnosis: The following stain(s) were used in diagnosing the case: Alcian Blue pH 2.5 Stain, Warthin-Starry Stain. The control(s) stained appropriately. Report signed out from the following location(s) Technical Component performed at [facility],[place] [address].PRFF:[id number],TTS:[id number]., Technical Component performed at [place], [place], [place] [address]. CLIA #: [id number], Interpretation performed at [place], [place], [place] [address]. CLIA #:  IMPRESSION:  #1. 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 #2. Peripheral vascular disease. #3. Chronic obstructive pulmonary disease. #4. Gastroesophageal reflux disease with stricture.   PLAN:  #1. Intravenous Feraheme will be given on 06/04/2014 and 06/11/2014. #2. Follow-up in 6 weeks with CBC, ferritin. #3. She was encouraged to stop smoking by smoking one cigarette each day until she got down to 3 or 4 per day.  I appreciate the opportunity of sharing in her care.   Doroteo Bradford, MD 05/30/2014 4:39 PM   DISCLAIMER:  This note was dictated with voice recognition softwre.  Similar sounding words can inadvertently be transcribed inaccurately and may not be corrected upon review.

## 2014-05-31 LAB — INTRINSIC FACTOR ANTIBODIES: Intrinsic Factor: NEGATIVE

## 2014-06-02 LAB — GASTRIN: Gastrin: 60 pg/mL (ref <=100)

## 2014-06-03 LAB — ANTI-PARIETAL ANTIBODY: Parietal Cell Antibody-IgG: NEGATIVE

## 2014-06-04 ENCOUNTER — Encounter: Payer: Self-pay | Admitting: Gastroenterology

## 2014-06-04 ENCOUNTER — Encounter (HOSPITAL_COMMUNITY): Payer: Self-pay

## 2014-06-04 ENCOUNTER — Ambulatory Visit (INDEPENDENT_AMBULATORY_CARE_PROVIDER_SITE_OTHER): Payer: Managed Care, Other (non HMO) | Admitting: Gastroenterology

## 2014-06-04 ENCOUNTER — Encounter (HOSPITAL_BASED_OUTPATIENT_CLINIC_OR_DEPARTMENT_OTHER): Payer: Managed Care, Other (non HMO)

## 2014-06-04 ENCOUNTER — Other Ambulatory Visit: Payer: Self-pay

## 2014-06-04 VITALS — BP 140/98 | HR 74 | Temp 97.2°F | Ht 62.0 in | Wt 129.0 lb

## 2014-06-04 DIAGNOSIS — R490 Dysphonia: Secondary | ICD-10-CM

## 2014-06-04 DIAGNOSIS — K2100 Gastro-esophageal reflux disease with esophagitis, without bleeding: Secondary | ICD-10-CM

## 2014-06-04 DIAGNOSIS — E611 Iron deficiency: Secondary | ICD-10-CM

## 2014-06-04 DIAGNOSIS — K219 Gastro-esophageal reflux disease without esophagitis: Secondary | ICD-10-CM | POA: Insufficient documentation

## 2014-06-04 MED ORDER — SODIUM CHLORIDE 0.9 % IV SOLN
510.0000 mg | Freq: Once | INTRAVENOUS | Status: AC
  Administered 2014-06-04: 510 mg via INTRAVENOUS
  Filled 2014-06-04: qty 17

## 2014-06-04 MED ORDER — SODIUM CHLORIDE 0.9 % IV SOLN
Freq: Once | INTRAVENOUS | Status: AC
  Administered 2014-06-04: 13:00:00 via INTRAVENOUS

## 2014-06-04 NOTE — Patient Instructions (Signed)
1. Try to decrease pantoprazole to once in the morning before breakfast ONLY. If you have flare of your reflux symptoms, you may resume the night time dose.  2. I will review your upcoming labs from Dr. Barnet Glasgow and we will let you know if further work up of iron deficiency anemia is needed. 3. Referral to ENT for chronic hoarseness. 4. Return to the office in 6 months with Dr. Gala Romney.

## 2014-06-04 NOTE — Progress Notes (Signed)
Primary Care Physician: Delphina Cahill, MD  Primary Gastroenterologist:  Garfield Cornea, MD   Chief Complaint  Patient presents with  . Follow-up    HPI: Connie Farrell is a 53 y.o. female here for follow-up. Seen initially seen back in July 2015. Back in February she presented to the hospital with abdominal pain. Had appendicitis with perforation/peritonitis requiring surgery. She went on to have persistent nagging right lower quadrant pain with repeat CTs in June showing loculated fluid collections within the pelvis on the right. She was treated with antibiotic for possible postsurgical abscess. Associated weight loss and anorexia. Last CT imaging 02/04/2014 that showed no acute inflammatory process within the abdomen. No pelvic abscess. She did have gallstones. Stent noted in the left common iliac artery.  She has a history of iron deficiency anemia, GERD, esophageal dysphagia. EGD in August showed possible Barrett's esophagus endoscopically however not confirmed on biopsy. She had benign gastritis, no H pylori. Colonoscopy at the same time showed anal canal hemorrhoids, 2 diminutive polyps in the mid descending segment which were hyperplastic. Distal 5 cm of TI appeared normal.  She is being seen by hematology for IDA/mixed anemia. First visit last week. She is scheduled to receive first dose of IV iron. Had been on ferrous sulfate. Her ferritin went from 9 to 32. Iron 33 to 37. Hgb 11.3 to 13.4.   She has been on protonix BID for unclear length of time. She was on this when she first came to see [place]. If misses night time dose would have indigestion. No dysphagia. No further abdominal pain. Hoarseness persists. BM normal. Has gained some of her weight back.   Current Outpatient Prescriptions  Medication Sig Dispense Refill  . aspirin EC 81 MG tablet Take 81 mg by mouth daily.    Marland Kitchen atorvastatin (LIPITOR) 20 MG tablet Take 20 mg by mouth daily.    . clopidogrel (PLAVIX) 75 MG tablet TAKE  ONE TABLET DAILY WITH BREAKFAST. 30 tablet 6  . cyclobenzaprine (FLEXERIL) 10 MG tablet Take 5-10 mg by mouth 3 (three) times daily as needed for muscle spasms.     . diphenhydrAMINE (BENADRYL) 25 MG tablet Take 25 mg by mouth every 6 (six) hours as needed.    Marland Kitchen escitalopram (LEXAPRO) 10 MG tablet Take 10 mg by mouth Daily.    Marland Kitchen HYDROcodone-acetaminophen (NORCO/VICODIN) 5-325 MG per tablet Take 1 tablet by mouth every 8 (eight) hours as needed for moderate pain.    . metoprolol tartrate (LOPRESSOR) 25 MG tablet Take 0.5 tablets (12.5 mg total) by mouth 2 (two) times daily as needed (For Palpitations.). 30 tablet 3  . pantoprazole (PROTONIX) 40 MG tablet TAKE ONE TABLET BY MOUTH TWICE DAILY. 60 tablet 11   No current facility-administered medications for this visit.    Allergies as of 06/04/2014 - Review Complete 06/04/2014  Allergen Reaction Noted  . Latex Rash 09/10/2013  . Codeine Nausea And Vomiting 05/28/2011    ROS:  General: Negative for anorexia, weight loss, fever, chills, fatigue, weakness. ENT: Negative for difficulty swallowing , nasal congestion.see hpi CV: Negative for chest pain, angina, palpitations, dyspnea on exertion, peripheral edema.  Respiratory: Negative for dyspnea at rest, dyspnea on exertion, cough, sputum, wheezing.  GI: See history of present illness. GU:  Negative for dysuria, hematuria, urinary incontinence, urinary frequency, nocturnal urination.  Endo: Negative for unusual weight change.    Physical Examination:   BP 140/98 mmHg  Pulse 74  Temp(Src) 97.2 F (36.2  C) (Oral)  Ht 5\' 2"  (1.575 m)  Wt 129 lb (58.514 kg)  BMI 23.59 kg/m2  General: Well-nourished, well-developed in no acute distress. Voice is raspy.  Eyes: No icterus. Mouth: Oropharyngeal mucosa moist and pink , no lesions erythema or exudate. Lungs: Clear to auscultation bilaterally.  Heart: Regular rate and rhythm, no murmurs rubs or gallops.  Abdomen: Bowel sounds are normal,  nontender, nondistended, no hepatosplenomegaly or masses, no abdominal bruits or hernia , no rebound or guarding.   Extremities: No lower extremity edema. No clubbing or deformities. Neuro: Alert and oriented x 4   Skin: Warm and dry, no jaundice.   Psych: Alert and cooperative, normal mood and affect.  Labs:  Lab Results  Component Value Date   CREATININE 1.27* 05/30/2014   BUN 17 05/30/2014   NA 142 05/30/2014   K 3.8 05/30/2014   CL 105 05/30/2014   CO2 24 05/30/2014   Lab Results  Component Value Date   ALT 9 05/30/2014   AST 12 05/30/2014   ALKPHOS 110 05/30/2014   BILITOT 0.2* 05/30/2014   Lab Results  Component Value Date   WBC 5.0 05/30/2014   HGB 13.4 05/30/2014   HCT 40.8 05/30/2014   MCV 97.6 05/30/2014   PLT 187 05/30/2014   Lab Results  Component Value Date   IRON 37* 05/30/2014   TIBC 308 05/30/2014   FERRITIN 32 05/30/2014    Imaging Studies: No results found.

## 2014-06-04 NOTE — Progress Notes (Signed)
Patient tolerated infusion well.

## 2014-06-04 NOTE — Progress Notes (Addendum)
Pt has an appointment with Dr. Benjamine Mola on 07/04/2014 @ 3:30pm. Pts daughter is aware of appt.

## 2014-06-04 NOTE — Assessment & Plan Note (Signed)
Doing well at this time. No Barrett's on biopsy although endoscopic there were some changes suggest of. At this time, I have recommended she go back to once daily PPI, 30 minutes before breakfast. There has been no improvement in her hoarseness. She has some IDA and would like her on lowest tolerable dose of PPI. If she has breakthrough symptoms, she has been advised to resume PPI BID. Return to office in six months.

## 2014-06-04 NOTE — Assessment & Plan Note (Signed)
ENT referral. She has been adequately treated for LPR with no improvement in hoarseness. Chronic, current smoker.

## 2014-06-04 NOTE — Assessment & Plan Note (Signed)
Will continue to follow along with hematology. If any indication of GI blood loss, worsening anemia, persistent IDA, would consider small bowel capsule. No indication at this time. Return to office in six months or sooner if needed.

## 2014-06-04 NOTE — Patient Instructions (Addendum)
[facility] Discharge Instructions   Today you were given Feraheme (iron) infusion. Return as scheduled for lab work and office visit.  Feel free to call the clinic with any questions or concerns.  Thank you for choosing [facility] to provide your oncology and hematology care.  To afford each patient quality time with our providers, please arrive at least 15 minutes before your scheduled appointment time.  With your help, our goal is to use those 15 minutes to complete the necessary work-up to ensure our physicians have the information they need to help with your evaluation and healthcare recommendations.    Effective January 1st, 2014, we ask that you re-schedule your appointment with our physicians should you arrive 10 or more minutes late for your appointment.  We strive to give you quality time with our providers, and arriving late affects you and other patients whose appointments are after yours.    Again, thank you for choosing [facility].  Our hope is that these requests will decrease the amount of time that you wait before being seen by our physicians.       _____________________________________________________________  Should you have questions after your visit to [facility], please contact our office at [phone] between the hours of 8:30 a.m. and 4:30 p.m.  Voicemails left after 4:30 p.m. will not be returned until the following business day.  For prescription refill requests, have your pharmacy contact our office with your prescription refill request.    _______________________________________________________________  We hope that we have given you very good care.  You may receive a patient satisfaction survey in the mail, please complete it and return it as soon as possible.  We value your feedback!  _______________________________________________________________  Have you asked about our STAR program?  STAR  stands for Survivorship Training and Rehabilitation, and this is a nationally recognized cancer care program that focuses on survivorship and rehabilitation.  Cancer and cancer treatments may cause problems, such as, pain, making you feel tired and keeping you from doing the things that you need or want to do. Cancer rehabilitation can help. Our goal is to reduce these troubling effects and help you have the best quality of life possible.  You may receive a survey from a nurse that asks questions about your current state of health.  Based on the survey results, all eligible patients will be referred to the [facility] program for an evaluation so we can better serve you!  A frequently asked questions sheet is available upon request. Ferumoxytol injection What is this medicine? FERUMOXYTOL is an iron complex. Iron is used to make healthy red blood cells, which carry oxygen and nutrients throughout the body. This medicine is used to treat iron deficiency anemia in people with chronic kidney disease. This medicine may be used for other purposes; ask your health care provider or pharmacist if you have questions. COMMON BRAND NAME(S): Feraheme What should I tell my health care provider before I take this medicine? They need to know if you have any of these conditions: -anemia not caused by low iron levels -high levels of iron in the blood -magnetic resonance imaging (MRI) test scheduled -an unusual or allergic reaction to iron, other medicines, foods, dyes, or preservatives -pregnant or trying to get pregnant -breast-feeding How should I use this medicine? This medicine is for injection into a vein. It is given by a health care professional in a hospital or clinic setting. Talk to  your pediatrician regarding the use of this medicine in children. Special care may be needed. Overdosage: If you think you've taken too much of this medicine contact a poison control center or emergency room at once. Overdosage:  If you think you have taken too much of this medicine contact a poison control center or emergency room at once. NOTE: This medicine is only for you. Do not share this medicine with others. What if I miss a dose? It is important not to miss your dose. Call your doctor or health care professional if you are unable to keep an appointment. What may interact with this medicine? This medicine may interact with the following medications: -other iron products This list may not describe all possible interactions. Give your health care provider a list of all the medicines, herbs, non-prescription drugs, or dietary supplements you use. Also tell them if you smoke, drink alcohol, or use illegal drugs. Some items may interact with your medicine. What should I watch for while using this medicine? Visit your doctor or healthcare professional regularly. Tell your doctor or healthcare professional if your symptoms do not start to get better or if they get worse. You may need blood work done while you are taking this medicine. You may need to follow a special diet. Talk to your doctor. Foods that contain iron include: whole grains/cereals, dried fruits, beans, or peas, leafy green vegetables, and organ meats (liver, kidney). What side effects may I notice from receiving this medicine? Side effects that you should report to your doctor or health care professional as soon as possible: -allergic reactions like skin rash, itching or hives, swelling of the face, lips, or tongue -breathing problems -changes in blood pressure -feeling faint or lightheaded, falls -fever or chills -flushing, sweating, or hot feelings -swelling of the ankles or feet Side effects that usually do not require medical attention (Report these to your doctor or health care professional if they continue or are bothersome.): -diarrhea -headache -nausea, vomiting -stomach pain This list may not describe all possible side effects. Call your  doctor for medical advice about side effects. You may report side effects to FDA at 1-800-FDA-1088. Where should I keep my medicine? This drug is given in a hospital or clinic and will not be stored at home. NOTE: This sheet is a summary. It may not cover all possible information. If you have questions about this medicine, talk to your doctor, pharmacist, or health care provider.  2015, Elsevier/Gold Standard. (2012-02-18 15:23:36)

## 2014-06-06 ENCOUNTER — Telehealth (HOSPITAL_COMMUNITY): Payer: Self-pay | Admitting: Hematology and Oncology

## 2014-06-06 NOTE — Telephone Encounter (Signed)
Per Rica Mote [phone] does not require auth.

## 2014-06-11 ENCOUNTER — Encounter (HOSPITAL_BASED_OUTPATIENT_CLINIC_OR_DEPARTMENT_OTHER): Payer: Managed Care, Other (non HMO)

## 2014-06-11 DIAGNOSIS — K909 Intestinal malabsorption, unspecified: Secondary | ICD-10-CM

## 2014-06-11 DIAGNOSIS — E611 Iron deficiency: Secondary | ICD-10-CM

## 2014-06-11 DIAGNOSIS — D509 Iron deficiency anemia, unspecified: Secondary | ICD-10-CM

## 2014-06-11 MED ORDER — SODIUM CHLORIDE 0.9 % IV SOLN
510.0000 mg | Freq: Once | INTRAVENOUS | Status: AC
  Administered 2014-06-11: 510 mg via INTRAVENOUS
  Filled 2014-06-11: qty 17

## 2014-06-11 MED ORDER — SODIUM CHLORIDE 0.9 % IV SOLN
Freq: Once | INTRAVENOUS | Status: AC
  Administered 2014-06-11: 14:00:00 via INTRAVENOUS

## 2014-06-11 NOTE — Progress Notes (Signed)
Tolerated well

## 2014-06-12 NOTE — Progress Notes (Signed)
cc'ed To pcp

## 2014-06-27 ENCOUNTER — Encounter (HOSPITAL_COMMUNITY): Payer: Self-pay | Admitting: Cardiovascular Disease

## 2014-07-04 ENCOUNTER — Ambulatory Visit (INDEPENDENT_AMBULATORY_CARE_PROVIDER_SITE_OTHER): Payer: Managed Care, Other (non HMO) | Admitting: Otolaryngology

## 2014-07-04 DIAGNOSIS — K219 Gastro-esophageal reflux disease without esophagitis: Secondary | ICD-10-CM

## 2014-07-04 DIAGNOSIS — R490 Dysphonia: Secondary | ICD-10-CM

## 2014-07-10 ENCOUNTER — Other Ambulatory Visit (HOSPITAL_COMMUNITY): Payer: Managed Care, Other (non HMO)

## 2014-07-10 ENCOUNTER — Ambulatory Visit (HOSPITAL_COMMUNITY): Payer: Managed Care, Other (non HMO) | Admitting: Hematology & Oncology

## 2014-08-02 ENCOUNTER — Encounter (HOSPITAL_COMMUNITY): Payer: Managed Care, Other (non HMO) | Attending: Hematology and Oncology | Admitting: Hematology & Oncology

## 2014-08-02 ENCOUNTER — Encounter (HOSPITAL_BASED_OUTPATIENT_CLINIC_OR_DEPARTMENT_OTHER): Payer: Managed Care, Other (non HMO)

## 2014-08-02 ENCOUNTER — Encounter (HOSPITAL_COMMUNITY): Payer: Self-pay | Admitting: Hematology & Oncology

## 2014-08-02 VITALS — BP 140/94 | HR 87 | Temp 97.8°F | Resp 20 | Wt 132.0 lb

## 2014-08-02 DIAGNOSIS — K2100 Gastro-esophageal reflux disease with esophagitis, without bleeding: Secondary | ICD-10-CM

## 2014-08-02 DIAGNOSIS — E611 Iron deficiency: Secondary | ICD-10-CM | POA: Diagnosis present

## 2014-08-02 DIAGNOSIS — D509 Iron deficiency anemia, unspecified: Secondary | ICD-10-CM

## 2014-08-02 DIAGNOSIS — K909 Intestinal malabsorption, unspecified: Secondary | ICD-10-CM

## 2014-08-02 DIAGNOSIS — K222 Esophageal obstruction: Secondary | ICD-10-CM

## 2014-08-02 LAB — CBC WITH DIFFERENTIAL/PLATELET
BASOS PCT: 1 % (ref 0–1)
Basophils Absolute: 0 10*3/uL (ref 0.0–0.1)
EOS PCT: 3 % (ref 0–5)
Eosinophils Absolute: 0.1 10*3/uL (ref 0.0–0.7)
HEMATOCRIT: 38.5 % (ref 36.0–46.0)
HEMOGLOBIN: 13.1 g/dL (ref 12.0–15.0)
Lymphocytes Relative: 30 % (ref 12–46)
Lymphs Abs: 1.3 10*3/uL (ref 0.7–4.0)
MCH: 32.4 pg (ref 26.0–34.0)
MCHC: 34 g/dL (ref 30.0–36.0)
MCV: 95.3 fL (ref 78.0–100.0)
MONOS PCT: 4 % (ref 3–12)
Monocytes Absolute: 0.2 10*3/uL (ref 0.1–1.0)
NEUTROS PCT: 62 % (ref 43–77)
Neutro Abs: 2.7 10*3/uL (ref 1.7–7.7)
Platelets: 164 10*3/uL (ref 150–400)
RBC: 4.04 MIL/uL (ref 3.87–5.11)
RDW: 13.7 % (ref 11.5–15.5)
WBC: 4.3 10*3/uL (ref 4.0–10.5)

## 2014-08-02 LAB — FERRITIN: Ferritin: 254 ng/mL (ref 10–291)

## 2014-08-02 MED ORDER — DEXLANSOPRAZOLE 60 MG PO CPDR: 60.0000 mg | DELAYED_RELEASE_CAPSULE | Freq: Every day | ORAL | Status: DC

## 2014-08-02 NOTE — Progress Notes (Signed)
Labs for cbcd ferr

## 2014-08-02 NOTE — Progress Notes (Signed)
Connie Cahill, MD  [place] [address]  Pagophagia Iron deficiency Anemia EGD with Barret's, 02/2014 Colonoscopy 02/2014 Serum ferritin 9 ng/ml 02/15/2014, Hgb 10.2 (normocytic)  CURRENT THERAPY: IV iron  INTERVAL HISTORY: Connie Farrell 54 y.o. female returns for follow-up of her iron deficiency.  She complains of sore throat.  She thinks it started around the time of her EGD. She has recently seen Dr. Benjamine Mola.   MEDICAL HISTORY: Past Medical History  Diagnosis Date  . Hypertension   . Seasonal allergies   . Neck pain, chronic   . Hyperlipemia   . PAD (peripheral artery disease)   . Exertional dyspnea   . GERD (gastroesophageal reflux disease)   . Arthritis     "hands; spine too" (04/04/2012)  . Anxiety   . Tobacco abuse   . Renal artery stenosis     has Fracture, ankle; Ankle fracture; PAD (peripheral artery disease); HTN (hypertension); Tobacco abuse; HLD (hyperlipidemia); Claudication; Acute appendicitis; Perforated appendicitis; Renal artery stenosis; Dysphagia, unspecified(787.20); RLQ abdominal pain; Heart palpitations; Iron deficiency; GERD (gastroesophageal reflux disease); and Hoarseness on her problem list.     No history exists.     is allergic to latex and codeine.  Connie Farrell does not currently have medications on file.  SURGICAL HISTORY: Past Surgical History  Procedure Laterality Date  . Anterior cervical decomp/discectomy fusion  ~ 04/2006    C6, 7, T1  . Tubal ligation  1990  . Peripheral arterial stent graft  04/04/2012  . Laparoscopic appendectomy N/A 09/10/2013    Procedure: APPENDECTOMY LAPAROSCOPIC;  Surgeon: Jamesetta So, MD;  Location: [facility] ORS;  Service: General;  Laterality: N/A;  . Colonoscopy N/A 02/26/2014    Adequate preparation. Minimal anal canal hemorrhoids;otherwise, normal rectumdiminutive polyps in the middescending segment; otherwise, the remainder of colonic mucosaappeared normal. Hyperplastic polyps.  .  Esophagogastroduodenoscopy N/A 02/26/2014    VEL:FYBOFBPZ distal esophagus, query short segment Barrett's. Status post Venia Minks dilation with esophageal biopsy. Abnormal gastric mucosa. Esophageal biopsy negative for Barrett's. Gastric biopsy showed mild chronic gastritis, no H pylori.  Venia Minks dilation N/A 02/26/2014    Procedure: Venia Minks DILATION;  Surgeon: Daneil Dolin, MD;  Location: [facility] ENDO SUITE;  Service: Endoscopy;  Laterality: N/A;  . Lower extremity angiogram N/A 04/04/2012    Procedure: LOWER EXTREMITY ANGIOGRAM;  Surgeon: Lorretta Harp, MD;  Location: [facility] CATH LAB;  Service: Cardiovascular;  Laterality: N/A;  . Abdominal angiogram  04/04/2012    Procedure: ABDOMINAL ANGIOGRAM;  Surgeon: Lorretta Harp, MD;  Location: [facility] CATH LAB;  Service: Cardiovascular;;  . Renal angiogram Left 04/22/2014    Procedure: RENAL ANGIOGRAM;  Surgeon: Lorretta Harp, MD;  Location[facility] CATH LAB;  Service: Cardiovascular;  Laterality: Left;    SOCIAL HISTORY: History   Social History  . Marital Status: Married    Spouse Name: N/A    Number of Children: N/A  . Years of Education: N/A   Occupational History  . [employer]   Social History Main Topics  . Smoking status: Current Every Day Smoker -- 0.75 packs/day for 36 years    Types: Cigarettes  . Smokeless tobacco: Never Used  . Alcohol Use: No  . Drug Use: No  . Sexual Activity: No   Other Topics Concern  . Not on file   Social History Narrative    FAMILY HISTORY: Family History  Problem Relation Age of Onset  .  Heart disease Other   . Arthritis    . Asthma    . Colon cancer      unknown, possibly father  . Leukemia Father   . Cancer Father   . Heart disease Father   . Heart disease Mother     Review of Systems  Constitutional: Negative.   HENT: Negative for congestion, ear discharge, ear pain, hearing loss, nosebleeds, sore throat and tinnitus.        Cracks in the corners of her mouth, sore throat    Eyes: Negative.   Respiratory: Positive for cough. Negative for stridor.        She thinks her cough is more from reflux than smoking  Cardiovascular: Negative.   Gastrointestinal: Positive for heartburn and nausea. Negative for vomiting, abdominal pain, diarrhea, constipation, blood in stool and melena.  Musculoskeletal: Positive for neck pain. Negative for myalgias, back pain, joint pain and falls.  Skin: Negative.   Neurological: Negative.  Negative for headaches.  Endo/Heme/Allergies: Negative.   Psychiatric/Behavioral: Negative.     PHYSICAL EXAMINATION  ECOG PERFORMANCE STATUS: 0 - Asymptomatic  Filed Vitals:   08/02/14 1020  BP: 140/94  Pulse: 87  Temp: 97.8 F (36.6 C)  Resp: 20    Physical Exam  Constitutional: She is oriented to person, place, and time and well-developed, well-nourished, and in no distress.  HENT:  Head: Normocephalic and atraumatic.  Nose: Nose normal.  Mouth/Throat: Oropharynx is clear and moist. No oropharyngeal exudate.  Eyes: Conjunctivae and EOM are normal. Pupils are equal, round, and reactive to light. Right eye exhibits no discharge. Left eye exhibits no discharge. No scleral icterus.  Neck: Normal range of motion. Neck supple. No tracheal deviation present. No thyromegaly present.  Cardiovascular: Normal rate, regular rhythm and normal heart sounds.  Exam reveals no gallop and no friction rub.   No murmur heard. Pulmonary/Chest: Effort normal and breath sounds normal. She has no wheezes. She has no rales.  Abdominal: Soft. Bowel sounds are normal. She exhibits no distension and no mass. There is no tenderness. There is no rebound and no guarding.  Musculoskeletal: Normal range of motion. She exhibits no edema.  Lymphadenopathy:    She has no cervical adenopathy.  Neurological: She is alert and oriented to person, place, and time. She has normal reflexes. No cranial nerve deficit. Gait normal. Coordination normal.  Skin: Skin is warm and  dry. No rash noted.  Psychiatric: Mood, memory, affect and judgment normal.  Nursing note and vitals reviewed.   LABORATORY DATA:  CBC    Component Value Date/Time   WBC 4.3 08/02/2014 1034   RBC 4.04 08/02/2014 1034   RBC 4.18 05/30/2014 1610   HGB 13.1 08/02/2014 1034   HCT 38.5 08/02/2014 1034   PLT 164 08/02/2014 1034   MCV 95.3 08/02/2014 1034   MCH 32.4 08/02/2014 1034   MCHC 34.0 08/02/2014 1034   RDW 13.7 08/02/2014 1034   LYMPHSABS 1.3 08/02/2014 1034   MONOABS 0.2 08/02/2014 1034   EOSABS 0.1 08/02/2014 1034   BASOSABS 0.0 08/02/2014 1034   CMP     Component Value Date/Time   NA 142 05/30/2014 1610   K 3.8 05/30/2014 1610   CL 105 05/30/2014 1610   CO2 24 05/30/2014 1610   GLUCOSE 101* 05/30/2014 1610   BUN 17 05/30/2014 1610   CREATININE 1.27* 05/30/2014 1610   CREATININE 1.36* 04/16/2014 1034   CALCIUM 9.4 05/30/2014 1610   PROT 7.1 05/30/2014 1610   ALBUMIN  4.0 05/30/2014 1610   AST 12 05/30/2014 1610   ALT 9 05/30/2014 1610   ALKPHOS 110 05/30/2014 1610   BILITOT 0.2* 05/30/2014 1610   GFRNONAA 48* 05/30/2014 1610   GFRAA 56* 05/30/2014 1610     ASSESSMENT and THERAPY PLAN:    Iron deficiency Pleasant 54 year old female with a history of iron deficiency anemia. She has had an EGD and colonoscopy. She has Barrett's esophagus. Many of her complaints revolve around reflux. She currently takes her PPI twice daily. She still complains of hoarseness and sore throat, intermittent nausea. I have given her prescription for dexilant, she has requested to try something different.  I will call her with the results of her laboratory studies today, should she need additional iron we will set her up for IV replacement. I will see her back again in 2 months with repeat laboratory studies including a CBC and serum iron studies, ferritin.     All questions were answered. The patient knows to call the clinic with any problems, questions or concerns. We can  certainly see the patient much sooner if necessary.  Molli Hazard 08/05/2014

## 2014-08-02 NOTE — Patient Instructions (Signed)
[place] at [facility]  Discharge Instructions:  Please call prior to follow-up if you have any problems or concerns We will call you with your iron levels.  _______________________________________________________________  Thank you for choosing [place] at [facility] to provide your oncology and hematology care.  To afford each patient quality time with our providers, please arrive at least 15 minutes before your scheduled appointment.  You need to re-schedule your appointment if you arrive 10 or more minutes late.  We strive to give you quality time with our providers, and arriving late affects you and other patients whose appointments are after yours.  Also, if you no show three or more times for appointments you may be dismissed from the clinic.  Again, thank you for choosing [place] at [place] hope is that these requests will allow you access to exceptional care and in a timely manner. _______________________________________________________________  If you have questions after your visit, please contact our office at [phone] between the hours of 8:30 a.m. and 5:00 p.m. Voicemails left after 4:30 p.m. will not be returned until the following business day. _______________________________________________________________  For prescription refill requests, have your pharmacy contact our office. _______________________________________________________________  Recommendations made by the consultant and any test results will be sent to your referring physician. _______________________________________________________________

## 2014-08-05 NOTE — Assessment & Plan Note (Signed)
Pleasant 54 year old female with a history of iron deficiency anemia. She has had an EGD and colonoscopy. She has Barrett's esophagus. Many of her complaints revolve around reflux. She currently takes her PPI twice daily. She still complains of hoarseness and sore throat, intermittent nausea. I have given her prescription for dexilant, she has requested to try something different.  I will call her with the results of her laboratory studies today, should she need additional iron we will set her up for IV replacement. I will see her back again in 2 months with repeat laboratory studies including a CBC and serum iron studies, ferritin.

## 2014-08-15 ENCOUNTER — Ambulatory Visit (INDEPENDENT_AMBULATORY_CARE_PROVIDER_SITE_OTHER): Payer: Managed Care, Other (non HMO) | Admitting: Otolaryngology

## 2014-08-15 DIAGNOSIS — R490 Dysphonia: Secondary | ICD-10-CM

## 2014-08-15 DIAGNOSIS — K219 Gastro-esophageal reflux disease without esophagitis: Secondary | ICD-10-CM

## 2014-08-16 ENCOUNTER — Other Ambulatory Visit: Payer: Self-pay | Admitting: Cardiology

## 2014-08-16 NOTE — Telephone Encounter (Signed)
Rx(s) sent to pharmacy electronically.  

## 2014-09-16 ENCOUNTER — Telehealth (HOSPITAL_COMMUNITY): Payer: Self-pay | Admitting: *Deleted

## 2014-09-17 ENCOUNTER — Other Ambulatory Visit: Payer: Self-pay | Admitting: Cardiovascular Disease

## 2014-09-18 NOTE — Telephone Encounter (Signed)
Rx(s) sent to pharmacy electronically.  

## 2014-10-01 ENCOUNTER — Encounter (HOSPITAL_BASED_OUTPATIENT_CLINIC_OR_DEPARTMENT_OTHER): Payer: Managed Care, Other (non HMO)

## 2014-10-01 ENCOUNTER — Encounter (HOSPITAL_COMMUNITY): Payer: Managed Care, Other (non HMO) | Attending: Hematology and Oncology | Admitting: Oncology

## 2014-10-01 ENCOUNTER — Encounter (HOSPITAL_COMMUNITY): Payer: Self-pay | Admitting: Oncology

## 2014-10-01 VITALS — BP 108/80 | HR 94 | Temp 97.8°F | Resp 18 | Wt 138.2 lb

## 2014-10-01 DIAGNOSIS — E611 Iron deficiency: Secondary | ICD-10-CM

## 2014-10-01 DIAGNOSIS — K2100 Gastro-esophageal reflux disease with esophagitis, without bleeding: Secondary | ICD-10-CM

## 2014-10-01 DIAGNOSIS — D509 Iron deficiency anemia, unspecified: Secondary | ICD-10-CM

## 2014-10-01 LAB — CBC WITH DIFFERENTIAL/PLATELET
BASOS PCT: 1 % (ref 0–1)
Basophils Absolute: 0.1 10*3/uL (ref 0.0–0.1)
Eosinophils Absolute: 0.2 10*3/uL (ref 0.0–0.7)
Eosinophils Relative: 4 % (ref 0–5)
HCT: 40.2 % (ref 36.0–46.0)
HEMOGLOBIN: 13.6 g/dL (ref 12.0–15.0)
LYMPHS ABS: 1.6 10*3/uL (ref 0.7–4.0)
Lymphocytes Relative: 29 % (ref 12–46)
MCH: 32.7 pg (ref 26.0–34.0)
MCHC: 33.8 g/dL (ref 30.0–36.0)
MCV: 96.6 fL (ref 78.0–100.0)
MONO ABS: 0.4 10*3/uL (ref 0.1–1.0)
MONOS PCT: 7 % (ref 3–12)
NEUTROS ABS: 3.2 10*3/uL (ref 1.7–7.7)
Neutrophils Relative %: 59 % (ref 43–77)
Platelets: 196 10*3/uL (ref 150–400)
RBC: 4.16 MIL/uL (ref 3.87–5.11)
RDW: 14.4 % (ref 11.5–15.5)
WBC: 5.4 10*3/uL (ref 4.0–10.5)

## 2014-10-01 NOTE — Progress Notes (Signed)
LABS DRAWN

## 2014-10-01 NOTE — Assessment & Plan Note (Signed)
Iron deficiency requiring IV iron replacement of IV Feraheme 510 mg on 06/04/2014 and 06/11/2014.  Colonoscopy and EGD performed in 02/2014 and both were negative for any signs of bleeding.  Ferritin last was WNL.  Labs today: CBC diff, iron/TIBC, ferritin.  Repeat labs in 3 and 6 months: CBC diff, iron/TIBC, ferritin.  Return in 6 months for follow-up.

## 2014-10-01 NOTE — Progress Notes (Signed)
Connie Cahill, MD  [address]  Connie Farrell Connie Farrell [id number]  Iron deficiency - Plan: CBC with Differential, Ferritin, Iron and TIBC  CURRENT THERAPY:  IV iron as needed.  INTERVAL HISTORY Connie Farrell 54 y.o. female returns for followup of iron deficiency requiring IV iron replacement of IV Feraheme 510 mg on 06/04/2014 and 06/11/2014.  I personally reviewed and went over laboratory results with the patient.  The results are noted within this dictation.  We will update her labs today.  She reports that she feels much improved since Iron replacement.  She denies any blood loss.  She notes a history of dark stools, but that has since resolved.  She otherwise denies any bowel changes.  Hematologically, she denies any complaints and ROS questioning is negative.   Past Medical History  Diagnosis Date  . Hypertension   . Seasonal allergies   . Neck pain, chronic   . Hyperlipemia   . PAD (peripheral artery disease)   . Exertional dyspnea   . GERD (gastroesophageal reflux disease)   . Arthritis     "hands; spine too" (04/04/2012)  . Anxiety   . Tobacco abuse   . Renal artery stenosis     has Fracture, ankle; Ankle fracture; PAD (peripheral artery disease); HTN (hypertension); Tobacco abuse; HLD (hyperlipidemia); Claudication; Acute appendicitis; Perforated appendicitis; Renal artery stenosis; Dysphagia, unspecified(787.20); RLQ abdominal pain; Heart palpitations; Iron deficiency; GERD (gastroesophageal reflux disease); and Hoarseness on her problem list.     is allergic to latex and codeine.  [name] does not currently have medications on file.  Past Surgical History  Procedure Laterality Date  . Anterior cervical decomp/discectomy fusion  ~ 04/2006    C6, 7, T1  . Tubal ligation  1990  . Peripheral arterial stent graft  04/04/2012  . Laparoscopic appendectomy N/A 09/10/2013    Procedure: APPENDECTOMY LAPAROSCOPIC;  Surgeon: Jamesetta So, MD;  Location: [facility] ORS;   Service: General;  Laterality: N/A;  . Colonoscopy N/A 02/26/2014    Adequate preparation. Minimal anal canal hemorrhoids;otherwise, normal rectumdiminutive polyps in the middescending segment; otherwise, the remainder of colonic mucosaappeared normal. Hyperplastic polyps.  . Esophagogastroduodenoscopy N/A 02/26/2014    JSH:FWYOVZCH distal esophagus, query short segment Barrett's. Status post Venia Minks dilation with esophageal biopsy. Abnormal gastric mucosa. Esophageal biopsy negative for Barrett's. Gastric biopsy showed mild chronic gastritis, no H pylori.  Venia Minks dilation N/A 02/26/2014    Procedure: Venia Minks DILATION;  Surgeon: Daneil Dolin, MD;  Location: [facility] ENDO SUITE;  Service: Endoscopy;  Laterality: N/A;  . Lower extremity angiogram N/A 04/04/2012    Procedure: LOWER EXTREMITY ANGIOGRAM;  Surgeon: Lorretta Harp, MD;  Location: [facility] CATH LAB;  Service: Cardiovascular;  Laterality: N/A;  . Abdominal angiogram  04/04/2012    Procedure: ABDOMINAL ANGIOGRAM;  Surgeon: Lorretta Harp, MD;  Location: [facility] CATH LAB;  Service: Cardiovascular;;  . Renal angiogram Left 04/22/2014    Procedure: RENAL ANGIOGRAM;  Surgeon: Lorretta Harp, MD;  Location: [facility] CATH LAB;  Service: Cardiovascular;  Laterality: Left;    Denies any headaches, dizziness, double vision, fevers, chills, night sweats, nausea, vomiting, diarrhea, constipation, chest pain, heart palpitations, shortness of breath, blood in stool, black tarry stool, urinary pain, urinary burning, urinary frequency, hematuria.   PHYSICAL EXAMINATION  ECOG PERFORMANCE STATUS: 0 - Asymptomatic  Filed Vitals:   10/01/14 1125  BP: 108/80  Pulse: 94  Temp: 97.8 F (36.6 C)  Resp: 18  GENERAL:alert, no distress, well nourished, well developed, comfortable, cooperative and smiling SKIN: skin color, texture, turgor are normal, no rashes or significant lesions HEAD: Normocephalic, No masses, lesions, tenderness or abnormalities EYES: normal,  PERRLA, EOMI, Conjunctiva are pink and non-injected EARS: External ears normal OROPHARYNX:lips, buccal mucosa, and tongue normal and mucous membranes are moist  NECK: supple, no adenopathy, thyroid normal size, non-tender, without nodularity, no stridor, non-tender, trachea midline LYMPH:  no palpable lymphadenopathy, no hepatosplenomegaly BREAST:not examined LUNGS: clear to auscultation and percussion HEART: regular rate & rhythm, no murmurs, no gallops, S1 normal and S2 normal ABDOMEN:abdomen soft, non-tender, normal bowel sounds and no masses or organomegaly BACK: Back symmetric, no curvature., No CVA tenderness EXTREMITIES:less then 2 second capillary refill, no joint deformities, effusion, or inflammation, no edema, no skin discoloration, no clubbing, no cyanosis  NEURO: alert & oriented x 3 with fluent speech, no focal motor/sensory deficits, gait normal   LABORATORY DATA: CBC    Component Value Date/Time   WBC 4.3 08/02/2014 1034   RBC 4.04 08/02/2014 1034   RBC 4.18 05/30/2014 1610   HGB 13.1 08/02/2014 1034   HCT 38.5 08/02/2014 1034   PLT 164 08/02/2014 1034   MCV 95.3 08/02/2014 1034   MCH 32.4 08/02/2014 1034   MCHC 34.0 08/02/2014 1034   RDW 13.7 08/02/2014 1034   LYMPHSABS 1.3 08/02/2014 1034   MONOABS 0.2 08/02/2014 1034   EOSABS 0.1 08/02/2014 1034   BASOSABS 0.0 08/02/2014 1034      Chemistry      Component Value Date/Time   NA 142 05/30/2014 1610   K 3.8 05/30/2014 1610   CL 105 05/30/2014 1610   CO2 24 05/30/2014 1610   BUN 17 05/30/2014 1610   CREATININE 1.27* 05/30/2014 1610   CREATININE 1.36* 04/16/2014 1034      Component Value Date/Time   CALCIUM 9.4 05/30/2014 1610   ALKPHOS 110 05/30/2014 1610   AST 12 05/30/2014 1610   ALT 9 05/30/2014 1610   BILITOT 0.2* 05/30/2014 1610     Lab Results  Component Value Date   IRON 37* 05/30/2014   TIBC 308 05/30/2014   FERRITIN 254 08/02/2014    ASSESSMENT AND PLAN:  Iron deficiency Iron  deficiency requiring IV iron replacement of IV Feraheme 510 mg on 06/04/2014 and 06/11/2014.  Colonoscopy and EGD performed in 02/2014 and both were negative for any signs of bleeding.  Ferritin last was WNL.  Labs today: CBC diff, iron/TIBC, ferritin.  Repeat labs in 3 and 6 months: CBC diff, iron/TIBC, ferritin.  Return in 6 months for follow-up.    THERAPY PLAN:  We will continue with iron support as needed.  All questions were answered. The patient knows to call the clinic with any problems, questions or concerns. We can certainly see the patient much sooner if necessary.  Patient and plan discussed with Dr. Ancil Linsey and she is in agreement with the aforementioned.   This note is electronically signed by: Robynn Pane 10/01/2014 11:52 AM

## 2014-10-01 NOTE — Patient Instructions (Signed)
..  [place] at [facility] Discharge Instructions  RECOMMENDATIONS MADE BY THE CONSULTANT AND ANY TEST RESULTS WILL BE SENT TO YOUR REFERRING PHYSICIAN.  Labs in 3 months and 6 months Return to see [place] in 6 months  Thank you for choosing [place] at [facility] to provide your oncology and hematology care.  To afford each patient quality time with our provider, please arrive at least 15 minutes before your scheduled appointment time.    You need to re-schedule your appointment should you arrive 10 or more minutes late.  We strive to give you quality time with our providers, and arriving late affects you and other patients whose appointments are after yours.  Also, if you no show three or more times for appointments you may be dismissed from the clinic at the providers discretion.     Again, thank you for choosing [facility].  Our hope is that these requests will decrease the amount of time that you wait before being seen by our physicians.       _____________________________________________________________  Should you have questions after your visit to [facility], please contact our office at [phone] between the hours of 8:30 a.m. and 4:30 p.m.  Voicemails left after 4:30 p.m. will not be returned until the following business day.  For prescription refill requests, have your pharmacy contact our office.

## 2014-10-02 LAB — IRON AND TIBC
Iron: 95 ug/dL (ref 42–145)
SATURATION RATIOS: 30 % (ref 20–55)
TIBC: 314 ug/dL (ref 250–470)
UIBC: 219 ug/dL (ref 125–400)

## 2014-10-02 LAB — FERRITIN: FERRITIN: 315 ng/mL — AB (ref 10–291)

## 2014-10-17 ENCOUNTER — Ambulatory Visit (INDEPENDENT_AMBULATORY_CARE_PROVIDER_SITE_OTHER): Payer: Managed Care, Other (non HMO) | Admitting: Otolaryngology

## 2014-10-17 DIAGNOSIS — K219 Gastro-esophageal reflux disease without esophagitis: Secondary | ICD-10-CM | POA: Diagnosis not present

## 2014-10-17 DIAGNOSIS — R490 Dysphonia: Secondary | ICD-10-CM

## 2014-11-18 ENCOUNTER — Encounter: Payer: Self-pay | Admitting: Internal Medicine

## 2014-12-02 ENCOUNTER — Telehealth (HOSPITAL_COMMUNITY): Payer: Self-pay | Admitting: *Deleted

## 2015-01-01 ENCOUNTER — Encounter (HOSPITAL_COMMUNITY): Payer: Managed Care, Other (non HMO) | Attending: Hematology & Oncology

## 2015-01-01 DIAGNOSIS — E611 Iron deficiency: Secondary | ICD-10-CM | POA: Insufficient documentation

## 2015-01-01 LAB — CBC WITH DIFFERENTIAL/PLATELET
BASOS ABS: 0 10*3/uL (ref 0.0–0.1)
BASOS PCT: 1 % (ref 0–1)
Eosinophils Absolute: 0.2 10*3/uL (ref 0.0–0.7)
Eosinophils Relative: 5 % (ref 0–5)
HCT: 37.5 % (ref 36.0–46.0)
HEMOGLOBIN: 12.4 g/dL (ref 12.0–15.0)
Lymphocytes Relative: 29 % (ref 12–46)
Lymphs Abs: 1.3 10*3/uL (ref 0.7–4.0)
MCH: 32.6 pg (ref 26.0–34.0)
MCHC: 33.1 g/dL (ref 30.0–36.0)
MCV: 98.7 fL (ref 78.0–100.0)
Monocytes Absolute: 0.2 10*3/uL (ref 0.1–1.0)
Monocytes Relative: 5 % (ref 3–12)
NEUTROS PCT: 60 % (ref 43–77)
Neutro Abs: 2.6 10*3/uL (ref 1.7–7.7)
Platelets: 201 10*3/uL (ref 150–400)
RBC: 3.8 MIL/uL — ABNORMAL LOW (ref 3.87–5.11)
RDW: 14.1 % (ref 11.5–15.5)
WBC: 4.3 10*3/uL (ref 4.0–10.5)

## 2015-01-01 LAB — FERRITIN: FERRITIN: 149 ng/mL (ref 11–307)

## 2015-01-01 LAB — IRON AND TIBC
Iron: 63 ug/dL (ref 28–170)
Saturation Ratios: 20 % (ref 10.4–31.8)
TIBC: 316 ug/dL (ref 250–450)
UIBC: 253 ug/dL

## 2015-01-01 NOTE — Progress Notes (Signed)
Labs drawn

## 2015-01-08 ENCOUNTER — Encounter: Payer: Self-pay | Admitting: *Deleted

## 2015-01-14 ENCOUNTER — Other Ambulatory Visit: Payer: Self-pay | Admitting: Cardiology

## 2015-01-14 NOTE — Telephone Encounter (Signed)
Rx(s) sent to pharmacy electronically.  

## 2015-01-16 ENCOUNTER — Ambulatory Visit (INDEPENDENT_AMBULATORY_CARE_PROVIDER_SITE_OTHER): Payer: Managed Care, Other (non HMO) | Admitting: Otolaryngology

## 2015-02-11 ENCOUNTER — Encounter: Payer: Self-pay | Admitting: Cardiovascular Disease

## 2015-02-11 ENCOUNTER — Ambulatory Visit (INDEPENDENT_AMBULATORY_CARE_PROVIDER_SITE_OTHER): Payer: Managed Care, Other (non HMO) | Admitting: Cardiovascular Disease

## 2015-02-11 VITALS — BP 120/72 | HR 73 | Ht 65.0 in | Wt 138.3 lb

## 2015-02-11 DIAGNOSIS — Z720 Tobacco use: Secondary | ICD-10-CM

## 2015-02-11 DIAGNOSIS — I10 Essential (primary) hypertension: Secondary | ICD-10-CM | POA: Diagnosis not present

## 2015-02-11 DIAGNOSIS — E785 Hyperlipidemia, unspecified: Secondary | ICD-10-CM

## 2015-02-11 DIAGNOSIS — I701 Atherosclerosis of renal artery: Secondary | ICD-10-CM | POA: Diagnosis not present

## 2015-02-11 DIAGNOSIS — I739 Peripheral vascular disease, unspecified: Secondary | ICD-10-CM | POA: Diagnosis not present

## 2015-02-11 NOTE — Assessment & Plan Note (Signed)
History of hypertension with blood pressure measured at 120/72. She is on lisinopril and hydrochlorothiazide as well as metoprolol. Continue current meds at current dosing

## 2015-02-11 NOTE — Assessment & Plan Note (Addendum)
Continues to smoke at least three quarters of pack-a-day recalcitrant risk factor modification

## 2015-02-11 NOTE — Patient Instructions (Signed)
Dr Gwenlyn Found has requested that you have a lower extremity arterial duplex. This test is an ultrasound of the arteries in the legs. It looks at arterial blood flow in the legs. Allow one hour for Lower Arterial scans. There are no restrictions or special instructions.  Your physician has requested that you have a renal artery duplex. During this test, an ultrasound is used to evaluate blood flow to the kidneys. Allow one hour for this exam. Do not eat after midnight the day before and avoid carbonated beverages. Take your medications as you usually do.  Dr Gwenlyn Found recommends that you schedule a follow-up appointment in 1 year. You will receive a reminder letter in the mail two months in advance. If you don't receive a letter, please call our office to schedule the follow-up appointment.

## 2015-02-11 NOTE — Assessment & Plan Note (Signed)
History of peripheral arterial disease status post left common iliac artery stenting by myself 04/04/12. Her Dopplers performed a year ago revealed his to be widely patent and her angiogram performed 04/22/14 demonstrated wide patency. She denies claudication.

## 2015-02-11 NOTE — Assessment & Plan Note (Signed)
History of hyperlipidemia on fenofibrate and atorvastatin followed by her PCP

## 2015-02-11 NOTE — Assessment & Plan Note (Signed)
History of 60% left renal artery stenosis demonstrated by angiography 04/22/14 with a 20 mm near pullback gradient. We will continue to follow this by duplex ultrasound. Her blood pressures have remained stable and her serum creatinine is 1.3.

## 2015-02-11 NOTE — Progress Notes (Signed)
02/11/2015 Connie Farrell   [birth year]  [id number]  Primary Physician Delphina Cahill, MD Primary Cardiologist: Lorretta Harp MD Renae Gloss   HPI:  The patient is a 54 year old, thin appearing, married, Caucasian female, mother of two, grandmother to one grandchild, referred to me by [name] for peripheral vascular evaluation. I last saw her in the office 04/16/14.  Her cardiac risk factor profile is positive for a 30 pack year of tobacco abuse, currently smoking 0.75 packs per day. Recalcitrant to risk factor modification. I did counsel her for greater than three minutes. Her other problems include hypertension and hyperlipidemia, as well as a strong family history for heart disease. She had a negative Myoview performed by [name] at [facility]. 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 I performed an angiogram on her April 04, 2012, confirming this with a 40-mm pull-back gradient. I stented her left common iliac artery with result in improvement in her left ABI from 0.82 to 0.96. Improvement in her Dopplers and claudication symptoms as well. We have been following her renal Dopplers closely which is usually has shown a significant progression of disease with decline in her pole to pole dimension the dimension from 10.7-9.2 cm an increase in her serum creatinine from 1.2-2.0. Her primary care physician subsequently discontinued her ACE inhibitor.I performed angiography on her 04/22/14 revealing a 60% left renal artery stenosis with a 20 mg. She also had a widely patent left iliac stent. I decided to treat this medically. She denies chest pain or shortness of breath.   Current Outpatient Prescriptions  Medication Sig Dispense Refill  . aspirin EC 81 MG tablet Take 81 mg by mouth daily.    Marland Kitchen atorvastatin  (LIPITOR) 20 MG tablet Take 20 mg by mouth daily.    . cyclobenzaprine (FLEXERIL) 10 MG tablet Take 5-10 mg by mouth 3 (three) times daily as needed for muscle spasms.     Marland Kitchen dexlansoprazole (DEXILANT) 60 MG capsule Take 1 capsule (60 mg total) by mouth daily. 30 capsule 6  . diphenhydrAMINE (BENADRYL) 25 MG tablet Take 25 mg by mouth every 6 (six) hours as needed.    Marland Kitchen escitalopram (LEXAPRO) 10 MG tablet Take 10 mg by mouth Daily.    . Fenofibric Acid 105 MG TABS Take 1 tablet by mouth daily. Take 1 tab daily    . lisinopril-hydrochlorothiazide (PRINZIDE,ZESTORETIC) 10-12.5 MG per tablet Take 1 tablet by mouth daily.     . metoprolol tartrate (LOPRESSOR) 25 MG tablet TAKE 1/2 TABLET BY MOUTH TWICE DAILY AS NEEDED. 30 tablet 6  . traMADol (ULTRAM) 50 MG tablet     . clopidogrel (PLAVIX) 75 MG tablet TAKE ONE TABLET DAILY WITH BREAKFAST. 30 tablet 7   No current facility-administered medications for this visit.    Allergies  Allergen Reactions  . Latex Rash    Blisters, rash  . Codeine Nausea And Vomiting    History   Social History  . Marital Status: Married    Spouse Name: N/A  . Number of Children: N/A  . Years of Education: N/A   Occupational History  . [employer]   Social History Main Topics  . Smoking status: Current Every Day Smoker -- 0.75 packs/day for 36 years    Types: Cigarettes  . Smokeless tobacco:  Never Used  . Alcohol Use: No  . Drug Use: No  . Sexual Activity: No   Other Topics Concern  . Not on file   Social History Narrative     Review of Systems: General: negative for chills, fever, night sweats or weight changes.  Cardiovascular: negative for chest pain, dyspnea on exertion, edema, orthopnea, palpitations, paroxysmal nocturnal dyspnea or shortness of breath Dermatological: negative for rash Respiratory: negative for cough or wheezing Urologic: negative for hematuria Abdominal: negative for nausea, vomiting, diarrhea, bright red  blood per rectum, melena, or hematemesis Neurologic: negative for visual changes, syncope, or dizziness All other systems reviewed and are otherwise negative except as noted above.    Blood pressure 120/72, pulse 73, height '5\' 5"'$  (1.651 m), weight 138 lb 5 oz (62.738 kg).  General appearance: alert and no distress Neck: no adenopathy, no carotid bruit, no JVD, supple, symmetrical, trachea midline and thyroid not enlarged, symmetric, no tenderness/mass/nodules Lungs: clear to auscultation bilaterally Heart: regular rate and rhythm, S1, S2 normal, no murmur, click, rub or gallop Extremities: extremities normal, atraumatic, no cyanosis or edema  EKG normal sinus rhythm at  73 without ST or T-wave changes. I personally reviewed this EKG  ASSESSMENT AND PLAN:   Tobacco abuse Continues to smoke at least three quarters of pack-a-day recalcitrant risk factor modification  Renal artery stenosis History of 60% left renal artery stenosis demonstrated by angiography 04/22/14 with a 20 mm near pullback gradient. We will continue to follow this by duplex ultrasound. Her blood pressures have remained stable and her serum creatinine is 1.3.  PAD (peripheral artery disease) History of peripheral arterial disease status post left common iliac artery stenting by myself 04/04/12. Her Dopplers performed a year ago revealed his to be widely patent and her angiogram performed 04/22/14 demonstrated wide patency. She denies claudication.  HTN (hypertension) History of hypertension with blood pressure measured at 120/72. She is on lisinopril and hydrochlorothiazide as well as metoprolol. Continue current meds at current dosing  HLD (hyperlipidemia) History of hyperlipidemia on fenofibrate and atorvastatin followed by her PCP      Lorretta Harp MD [facility], [facility] 02/11/2015 12:43 PM

## 2015-02-12 ENCOUNTER — Encounter: Payer: Self-pay | Admitting: Cardiovascular Disease

## 2015-02-14 ENCOUNTER — Encounter: Payer: Self-pay | Admitting: *Deleted

## 2015-02-20 ENCOUNTER — Ambulatory Visit (INDEPENDENT_AMBULATORY_CARE_PROVIDER_SITE_OTHER): Payer: Managed Care, Other (non HMO) | Admitting: Otolaryngology

## 2015-02-20 DIAGNOSIS — R490 Dysphonia: Secondary | ICD-10-CM | POA: Diagnosis not present

## 2015-02-20 DIAGNOSIS — K219 Gastro-esophageal reflux disease without esophagitis: Secondary | ICD-10-CM

## 2015-02-25 ENCOUNTER — Other Ambulatory Visit: Payer: Self-pay | Admitting: Cardiovascular Disease

## 2015-02-25 DIAGNOSIS — I739 Peripheral vascular disease, unspecified: Secondary | ICD-10-CM

## 2015-03-03 ENCOUNTER — Encounter (HOSPITAL_COMMUNITY): Payer: Managed Care, Other (non HMO)

## 2015-03-03 ENCOUNTER — Ambulatory Visit (HOSPITAL_COMMUNITY)
Admission: RE | Admit: 2015-03-03 | Discharge: 2015-03-03 | Disposition: A | Discharge status: Home / Self Care | Payer: Managed Care, Other (non HMO) | Source: Ambulatory Visit | Attending: Cardiovascular Disease | Admitting: Cardiovascular Disease

## 2015-03-03 ENCOUNTER — Ambulatory Visit (HOSPITAL_BASED_OUTPATIENT_CLINIC_OR_DEPARTMENT_OTHER)
Admission: RE | Admit: 2015-03-03 | Discharge: 2015-03-03 | Disposition: A | Discharge status: Home / Self Care | Payer: Managed Care, Other (non HMO) | Source: Ambulatory Visit | Attending: Cardiovascular Disease | Admitting: Cardiovascular Disease

## 2015-03-03 ENCOUNTER — Inpatient Hospital Stay (HOSPITAL_COMMUNITY): Admission: RE | Admit: 2015-03-03 | Payer: Managed Care, Other (non HMO) | Source: Ambulatory Visit

## 2015-03-03 DIAGNOSIS — I739 Peripheral vascular disease, unspecified: Secondary | ICD-10-CM

## 2015-03-03 DIAGNOSIS — E785 Hyperlipidemia, unspecified: Secondary | ICD-10-CM | POA: Diagnosis not present

## 2015-03-03 DIAGNOSIS — I10 Essential (primary) hypertension: Secondary | ICD-10-CM | POA: Insufficient documentation

## 2015-03-03 DIAGNOSIS — Z95828 Presence of other vascular implants and grafts: Secondary | ICD-10-CM | POA: Diagnosis not present

## 2015-03-03 DIAGNOSIS — I701 Atherosclerosis of renal artery: Secondary | ICD-10-CM | POA: Insufficient documentation

## 2015-03-03 DIAGNOSIS — I70213 Atherosclerosis of native arteries of extremities with intermittent claudication, bilateral legs: Secondary | ICD-10-CM | POA: Insufficient documentation

## 2015-03-04 ENCOUNTER — Telehealth: Payer: Self-pay | Admitting: Cardiovascular Disease

## 2015-03-04 NOTE — Telephone Encounter (Signed)
Connie Farrell is returning a call. Please call   Thanks

## 2015-03-05 ENCOUNTER — Telehealth: Payer: Self-pay | Admitting: Cardiovascular Disease

## 2015-03-05 NOTE — Telephone Encounter (Signed)
Closed encounter °

## 2015-03-12 NOTE — Telephone Encounter (Signed)
LM on verified voice mail w/ instruction for appt time next week. Instructed to call if further questions.

## 2015-03-18 ENCOUNTER — Other Ambulatory Visit (HOSPITAL_COMMUNITY): Payer: Self-pay | Admitting: Hematology & Oncology

## 2015-03-19 ENCOUNTER — Ambulatory Visit (INDEPENDENT_AMBULATORY_CARE_PROVIDER_SITE_OTHER): Payer: Managed Care, Other (non HMO) | Admitting: Cardiovascular Disease

## 2015-03-19 ENCOUNTER — Encounter: Payer: Self-pay | Admitting: Cardiovascular Disease

## 2015-03-19 VITALS — BP 98/62 | Ht 65.0 in | Wt 140.0 lb

## 2015-03-19 DIAGNOSIS — I739 Peripheral vascular disease, unspecified: Secondary | ICD-10-CM | POA: Diagnosis not present

## 2015-03-19 DIAGNOSIS — I701 Atherosclerosis of renal artery: Secondary | ICD-10-CM | POA: Diagnosis not present

## 2015-03-19 NOTE — Patient Instructions (Signed)
Your physician wants you to follow-up in: 1 year with Dr Gwenlyn Found. You will receive a reminder letter in the mail two months in advance. If you don't receive a letter, please call our office to schedule the follow-up appointment.   We will contact you about your dopplers every 6 months.

## 2015-03-19 NOTE — Progress Notes (Signed)
History of peripheral arterial disease status post post left common iliac artery stent in 2013. Angiography performed 04/22/14 revealed the stent to be widely patent. She does complain of lifestyle limiting claudication. Recent Dopplers suggested mild increase in velocities in both iliac arteries with slight decline in her left ABI to 0.91. I'm not convinced that this represents a significant enough decline to suggest in-stent restenosis occurring less than one year. We'll continue to follow her clinically and noninvasively.   Lorretta Harp, M.D., [place], [facility], [place] [address] [place], [place]  [address]  [phone] 03/19/2015 2:20 PM

## 2015-03-19 NOTE — Assessment & Plan Note (Signed)
History of peripheral arterial disease status post post left common iliac artery stent in 2013. Angiography performed 04/22/14 revealed the stent to be widely patent. She does complain of lifestyle limiting claudication. Recent Dopplers suggested mild increase in velocities in both iliac arteries with slight decline in her left ABI to 0.91. I'm not convinced that this represents a significant enough decline to suggest in-stent restenosis occurring less than one year. We'll continue to follow her clinically and noninvasively.

## 2015-03-19 NOTE — Assessment & Plan Note (Signed)
History of renal artery stenosis with angiography performed 04/22/14 revealing a 60% left renal artery stenosis with a 20 mm gradient. Intervention was not performed at that time. Recent renal Dopplers suggest this to be stable.

## 2015-04-03 ENCOUNTER — Encounter (HOSPITAL_COMMUNITY): Payer: Managed Care, Other (non HMO)

## 2015-04-03 ENCOUNTER — Encounter (HOSPITAL_COMMUNITY): Payer: Managed Care, Other (non HMO) | Attending: Oncology | Admitting: Oncology

## 2015-04-03 ENCOUNTER — Ambulatory Visit (HOSPITAL_COMMUNITY): Payer: Managed Care, Other (non HMO) | Admitting: Oncology

## 2015-04-03 VITALS — BP 128/85 | HR 76 | Temp 98.1°F | Resp 16 | Wt 138.6 lb

## 2015-04-03 DIAGNOSIS — E611 Iron deficiency: Secondary | ICD-10-CM | POA: Diagnosis not present

## 2015-04-03 LAB — CBC WITH DIFFERENTIAL/PLATELET
BASOS ABS: 0 10*3/uL (ref 0.0–0.1)
BASOS PCT: 1 %
Eosinophils Absolute: 0.1 10*3/uL (ref 0.0–0.7)
Eosinophils Relative: 3 %
HEMATOCRIT: 39.7 % (ref 36.0–46.0)
Hemoglobin: 13.3 g/dL (ref 12.0–15.0)
LYMPHS PCT: 25 %
Lymphs Abs: 1.1 10*3/uL (ref 0.7–4.0)
MCH: 33.1 pg (ref 26.0–34.0)
MCHC: 33.5 g/dL (ref 30.0–36.0)
MCV: 98.8 fL (ref 78.0–100.0)
MONO ABS: 0.2 10*3/uL (ref 0.1–1.0)
Monocytes Relative: 6 %
NEUTROS ABS: 2.8 10*3/uL (ref 1.7–7.7)
NEUTROS PCT: 66 %
Platelets: 204 10*3/uL (ref 150–400)
RBC: 4.02 MIL/uL (ref 3.87–5.11)
RDW: 14.1 % (ref 11.5–15.5)
WBC: 4.2 10*3/uL (ref 4.0–10.5)

## 2015-04-03 LAB — IRON AND TIBC
IRON: 61 ug/dL (ref 28–170)
SATURATION RATIOS: 18 % (ref 10.4–31.8)
TIBC: 347 ug/dL (ref 250–450)
UIBC: 286 ug/dL

## 2015-04-03 LAB — FERRITIN: Ferritin: 119 ng/mL (ref 11–307)

## 2015-04-03 NOTE — Progress Notes (Signed)
Connie Cahill, MD  [place] [address]  Iron deficiency  CURRENT THERAPY:  IV iron as needed.  Oncology Flowsheet 06/04/2014 06/11/2014  ferumoxytol (FERAHEME) IV 510 mg 510 mg    INTERVAL HISTORY Connie Farrell 54 y.o. female returns for followup of iron deficiency requiring IV iron replacement of IV Feraheme 510 mg on 06/04/2014 and 06/11/2014 while taking ASA and Plavix.  I personally reviewed and went over laboratory results with the patient.  The results are noted within this dictation.  We will update her labs today.  She denies any complaints today.  She is accompanied by her daughter this afternoon.  She denies any blood in her stool and dark tarry stool.   Past Medical History  Diagnosis Date  . Hypertension   . Seasonal allergies   . Neck pain, chronic   . Hyperlipemia   . PAD (peripheral artery disease)   . Exertional dyspnea   . GERD (gastroesophageal reflux disease)   . Arthritis     "hands; spine too" (04/04/2012)  . Anxiety   . Tobacco abuse   . Renal artery stenosis     has Fracture, ankle; Ankle fracture; PAD (peripheral artery disease); HTN (hypertension); Tobacco abuse; HLD (hyperlipidemia); Claudication; Acute appendicitis; Perforated appendicitis; Renal artery stenosis; Dysphagia, unspecified(787.20); RLQ abdominal pain; Heart palpitations; Iron deficiency; GERD (gastroesophageal reflux disease); and Hoarseness on her problem list.     is allergic to latex and codeine.  [name] had no medications administered during this visit.  Past Surgical History  Procedure Laterality Date  . Anterior cervical decomp/discectomy fusion  ~ 04/2006    C6, 7, T1  . Tubal ligation  1990  . Peripheral arterial stent graft  04/04/2012  . Laparoscopic appendectomy N/A 09/10/2013    Procedure: APPENDECTOMY LAPAROSCOPIC;  Surgeon: Jamesetta So, MD;  Location: [facility] OR[facility];  Service: General;  Laterality: N/A;  . Colonoscopy N/A 02/26/2014   Adequate preparation. Minimal anal canal hemorrhoids;otherwise, normal rectumdiminutive polyps in the middescending segment; otherwise, the remainder of colonic mucosaappeared normal. Hyperplastic polyps.  . Esophagogastroduodenoscopy N/A 02/26/2014    BTD:[id number] distal esophagus, query short segment Barrett's. Status post Venia Minks dilation with esophageal biopsy. Abnormal gastric mucosa. Esophageal biopsy negative for Barrett's. Gastric biopsy showed mild chronic gastritis, no H pylori.  Venia Minks dilation N/A 02/26/2014    Procedure: Venia Minks DILATION;  Surgeon: Daneil Dolin, MD;  Location: [facility] SU[facility];  Service: Endoscopy;  Laterality: N/A;  . Lower extremity angiogram N/A 04/04/2012    Procedure: LOWER EXTREMITY ANGIOGRAM;  Surgeon: Lorretta Harp, MD;  Location: [facility] CATH LAB;  Service: Cardiovascular;  Laterality: N/A;  . Abdominal angiogram  04/04/2012    Procedure: ABDOMINAL ANGIOGRAM;  Surgeon: Lorretta Harp, MD;  Location: [facility] C[facility] LAB;  Service: Cardiovascular;;  . Renal angiogram Left 04/22/2014    Procedure: RENAL ANGIOGRAM;  Surgeon: Lorretta Harp, MD;  Location: [facility] CATH LAB;  Service: Cardiovascular;  Laterality: Left;    Denies any headaches, dizziness, double vision, fevers, chills, night sweats, nausea, vomiting, diarrhea, constipation, chest pain, heart palpitations, shortness of breath, blood in stool, black tarry stool, urinary pain, urinary burning, urinary frequency, hematuria.   PHYSICAL EXAMINATION  ECOG PERFORMANCE STATUS: 0 - Asymptomatic  Filed Vitals:   04/03/15 1315  BP: 128/85  Pulse: 76  Temp: 98.1 F (36.7 C)  Resp: 16    GENERAL:alert, no distress, well nourished, well developed, comfortable, cooperative and smiling,  accompanied by her daughter. SKIN: skin color, texture, turgor are normal, no rashes or significant lesions HEAD: Normocephalic, No masses, lesions, tenderness or abnormalities EYES: normal, PERRLA, EOMI, Conjunctiva are pink and  non-injected EARS: External ears normal OROPHARYNX:lips, buccal mucosa, and tongue normal and mucous membranes are moist  NECK: supple, no adenopathy, thyroid normal size, non-tender, without nodularity, no stridor, non-tender, trachea midline LYMPH:  no palpable lymphadenopathy, no hepatosplenomegaly BREAST:not examined LUNGS: clear to auscultation and percussion HEART: regular rate & rhythm, no murmurs, no gallops, S1 normal and S2 normal ABDOMEN:abdomen soft, non-tender, normal bowel sounds and no masses or organomegaly BACK: Back symmetric, no curvature., No CVA tenderness EXTREMITIES:less then 2 second capillary refill, no joint deformities, effusion, or inflammation, no edema, no skin discoloration, no clubbing, no cyanosis  NEURO: alert & oriented x 3 with fluent speech, no focal motor/sensory deficits, gait normal   LABORATORY DATA: CBC    Component Value Date/Time   WBC 4.2 04/03/2015 1230   RBC 4.02 04/03/2015 1230   RBC 4.18 05/30/2014 1610   HGB 13.3 04/03/2015 1230   HCT 39.7 04/03/2015 1230   PLT 204 04/03/2015 1230   MCV 98.8 04/03/2015 1230   MCH 33.1 04/03/2015 1230   MCHC 33.5 04/03/2015 1230   RDW 14.1 04/03/2015 1230   LYMPHSABS 1.1 04/03/2015 1230   MONOABS 0.2 04/03/2015 1230   EOSABS 0.1 04/03/2015 1230   BASOSABS 0.0 04/03/2015 1230      Chemistry      Component Value Date/Time   NA 142 05/30/2014 1610   K 3.8 05/30/2014 1610   CL 105 05/30/2014 1610   CO2 24 05/30/2014 1610   BUN 17 05/30/2014 1610   CREATININE 1.27* 05/30/2014 1610   CREATININE 1.36* 04/16/2014 1034      Component Value Date/Time   CALCIUM 9.4 05/30/2014 1610   ALKPHOS 110 05/30/2014 1610   AST 12 05/30/2014 1610   ALT 9 05/30/2014 1610   BILITOT 0.2* 05/30/2014 1610     Lab Results  Component Value Date   IRON 63 01/01/2015   TIBC 316 01/01/2015   FERRITIN 149 01/01/2015    ASSESSMENT AND PLAN:  Iron deficiency Iron deficiency requiring IV iron replacement of IV  Feraheme 510 mg on 06/04/2014 and 06/11/2014.  Colonoscopy and EGD performed in 02/2014 and both were negative for any signs of bleeding.   Labs today: CBC diff, iron/TIBC, ferritin.    Repeat labs in 3 and 6 months: CBC diff, iron/TIBC, ferritin.    Return in 6 months for follow-up.  When she returns, I will gather an updated smoking history and see if she qualifies for lung cancer screening.   THERAPY PLAN:  We will continue with iron support as needed.  All questions were answered. The patient knows to call the clinic with any problems, questions or concerns. We can certainly see the patient much sooner if necessary.  Patient and plan discussed with Dr. Ancil Linsey and she is in agreement with the aforementioned.   This note is electronically signed by: Robynn Pane 04/03/2015 2:18 PM

## 2015-04-03 NOTE — Assessment & Plan Note (Addendum)
Iron deficiency requiring IV iron replacement of IV Feraheme 510 mg on 06/04/2014 and 06/11/2014.  Colonoscopy and EGD performed in 02/2014 and both were negative for any signs of bleeding.   Labs today: CBC diff, iron/TIBC, ferritin.    Repeat labs in 3 and 6 months: CBC diff, iron/TIBC, ferritin.    Return in 6 months for follow-up.  When she returns, I will gather an updated smoking history and see if she qualifies for lung cancer screening.

## 2015-04-03 NOTE — Patient Instructions (Signed)
[place] at [facility] Discharge Instructions  RECOMMENDATIONS MADE BY THE CONSULTANT AND ANY TEST RESULTS WILL BE SENT TO YOUR REFERRING PHYSICIAN.  Exam per Meriel Flavors. Return for lab work in 3 months and 6 months. MD appointment again in 6 months. Report any issues/concerns to clinic as needed.  Thank you for choosing [place] at [facility] to provide your oncology and hematology care.  To afford each patient quality time with our provider, please arrive at least 15 minutes before your scheduled appointment time.    You need to re-schedule your appointment should you arrive 10 or more minutes late.  We strive to give you quality time with our providers, and arriving late affects you and other patients whose appointments are after yours.  Also, if you no show three or more times for appointments you may be dismissed from the clinic at the providers discretion.     Again, thank you for choosing [facility].  Our hope is that these requests will decrease the amount of time that you wait before being seen by our physicians.       _____________________________________________________________  Should you have questions after your visit to [facility], please contact our office at [phone] between the hours of 8:30 a.m. and 4:30 p.m.  Voicemails left after 4:30 p.m. will not be returned until the following business day.  For prescription refill requests, have your pharmacy contact our office.

## 2015-05-15 ENCOUNTER — Other Ambulatory Visit (HOSPITAL_COMMUNITY): Payer: Self-pay | Admitting: Hematology & Oncology

## 2015-05-22 ENCOUNTER — Ambulatory Visit (INDEPENDENT_AMBULATORY_CARE_PROVIDER_SITE_OTHER): Payer: Managed Care, Other (non HMO) | Admitting: Otolaryngology

## 2015-05-22 DIAGNOSIS — K219 Gastro-esophageal reflux disease without esophagitis: Secondary | ICD-10-CM

## 2015-05-22 DIAGNOSIS — R490 Dysphonia: Secondary | ICD-10-CM | POA: Diagnosis not present

## 2015-06-16 ENCOUNTER — Other Ambulatory Visit: Payer: Self-pay | Admitting: Cardiovascular Disease

## 2015-06-24 ENCOUNTER — Other Ambulatory Visit (HOSPITAL_COMMUNITY): Payer: Self-pay

## 2015-06-24 DIAGNOSIS — E611 Iron deficiency: Secondary | ICD-10-CM

## 2015-07-03 ENCOUNTER — Encounter (HOSPITAL_COMMUNITY): Payer: Managed Care, Other (non HMO) | Attending: Hematology & Oncology

## 2015-07-03 DIAGNOSIS — E611 Iron deficiency: Secondary | ICD-10-CM

## 2015-07-03 DIAGNOSIS — D509 Iron deficiency anemia, unspecified: Secondary | ICD-10-CM | POA: Diagnosis not present

## 2015-07-03 LAB — CBC WITH DIFFERENTIAL/PLATELET
Basophils Absolute: 0 10*3/uL (ref 0.0–0.1)
Basophils Relative: 1 %
Eosinophils Absolute: 0.2 10*3/uL (ref 0.0–0.7)
Eosinophils Relative: 3 %
HEMATOCRIT: 40.3 % (ref 36.0–46.0)
HEMOGLOBIN: 13.2 g/dL (ref 12.0–15.0)
LYMPHS ABS: 1.3 10*3/uL (ref 0.7–4.0)
Lymphocytes Relative: 26 %
MCH: 32.8 pg (ref 26.0–34.0)
MCHC: 32.8 g/dL (ref 30.0–36.0)
MCV: 100.2 fL — ABNORMAL HIGH (ref 78.0–100.0)
MONO ABS: 0.2 10*3/uL (ref 0.1–1.0)
MONOS PCT: 5 %
NEUTROS ABS: 3.3 10*3/uL (ref 1.7–7.7)
NEUTROS PCT: 65 %
Platelets: 208 10*3/uL (ref 150–400)
RBC: 4.02 MIL/uL (ref 3.87–5.11)
RDW: 13.9 % (ref 11.5–15.5)
WBC: 5 10*3/uL (ref 4.0–10.5)

## 2015-07-03 LAB — IRON AND TIBC
Iron: 55 ug/dL (ref 28–170)
Saturation Ratios: 16 % (ref 10.4–31.8)
TIBC: 340 ug/dL (ref 250–450)
UIBC: 285 ug/dL

## 2015-07-03 LAB — FERRITIN: FERRITIN: 91 ng/mL (ref 11–307)

## 2015-07-03 NOTE — Progress Notes (Unsigned)
Connie Farrell presented for lab work. Labs per MD order drawn via Peripheral Line 23 gauge needle inserted in left arm  Good blood return present. Procedure without incident.  Needle removed intact. Patient tolerated procedure well.

## 2015-07-04 ENCOUNTER — Other Ambulatory Visit (HOSPITAL_COMMUNITY): Payer: Self-pay | Admitting: Oncology

## 2015-07-04 DIAGNOSIS — E611 Iron deficiency: Secondary | ICD-10-CM

## 2015-07-10 ENCOUNTER — Encounter (HOSPITAL_BASED_OUTPATIENT_CLINIC_OR_DEPARTMENT_OTHER): Payer: Managed Care, Other (non HMO)

## 2015-07-10 VITALS — BP 112/73 | HR 77 | Temp 98.0°F | Resp 16

## 2015-07-10 DIAGNOSIS — E611 Iron deficiency: Secondary | ICD-10-CM | POA: Diagnosis not present

## 2015-07-10 MED ORDER — SODIUM CHLORIDE 0.9 % IV SOLN
125.0000 mg | Freq: Once | INTRAVENOUS | Status: AC
  Administered 2015-07-10: 125 mg via INTRAVENOUS
  Filled 2015-07-10: qty 10

## 2015-07-10 MED ORDER — SODIUM CHLORIDE 0.9 % IV SOLN
INTRAVENOUS | Status: DC
  Administered 2015-07-10: 14:00:00 via INTRAVENOUS

## 2015-07-10 NOTE — Progress Notes (Signed)
Patient tolerated infusion well.  VSS throughout.

## 2015-07-10 NOTE — Patient Instructions (Signed)
[place] at [facility] Discharge Instructions  RECOMMENDATIONS MADE BY THE CONSULTANT AND ANY TEST RESULTS WILL BE SENT TO YOUR REFERRING PHYSICIAN.  IV Iron today.  Please return as scheduled.    Thank you for choosing [place] at [facility] to provide your oncology and hematology care.  To afford each patient quality time with our provider, please arrive at least 15 minutes before your scheduled appointment time.    You need to re-schedule your appointment should you arrive 10 or more minutes late.  We strive to give you quality time with our providers, and arriving late affects you and other patients whose appointments are after yours.  Also, if you no show three or more times for appointments you may be dismissed from the clinic at the providers discretion.     Again, thank you for choosing [facility].  Our hope is that these requests will decrease the amount of time that you wait before being seen by our physicians.       _____________________________________________________________  Should you have questions after your visit to [facility], please contact our office at [phone] between the hours of 8:30 a.m. and 4:30 p.m.  Voicemails left after 4:30 p.m. will not be returned until the following business day.  For prescription refill requests, have your pharmacy contact our office.

## 2015-07-31 ENCOUNTER — Other Ambulatory Visit (HOSPITAL_COMMUNITY): Payer: Self-pay | Admitting: Oncology

## 2015-07-31 DIAGNOSIS — K2100 Gastro-esophageal reflux disease with esophagitis, without bleeding: Secondary | ICD-10-CM

## 2015-07-31 MED ORDER — DEXLANSOPRAZOLE 60 MG PO CPDR: DELAYED_RELEASE_CAPSULE | ORAL | Status: DC

## 2015-08-01 ENCOUNTER — Other Ambulatory Visit: Payer: Self-pay

## 2015-08-01 MED ORDER — CLOPIDOGREL BISULFATE 75 MG PO TABS: 75.0000 mg | ORAL_TABLET | Freq: Every day | ORAL | Status: DC

## 2015-08-01 MED ORDER — METOPROLOL TARTRATE 25 MG PO TABS: 12.5000 mg | ORAL_TABLET | Freq: Two times a day (BID) | ORAL | Status: DC | PRN

## 2015-08-01 NOTE — Telephone Encounter (Signed)
Rx(s) sent to pharmacy electronically.  

## 2015-08-04 ENCOUNTER — Other Ambulatory Visit: Payer: Self-pay | Admitting: *Deleted

## 2015-08-04 MED ORDER — METOPROLOL TARTRATE 25 MG PO TABS: 12.5000 mg | ORAL_TABLET | Freq: Two times a day (BID) | ORAL | Status: DC | PRN

## 2015-08-04 MED ORDER — CLOPIDOGREL BISULFATE 75 MG PO TABS: 75.0000 mg | ORAL_TABLET | Freq: Every day | ORAL | Status: DC

## 2015-08-08 ENCOUNTER — Other Ambulatory Visit (HOSPITAL_COMMUNITY): Payer: Self-pay | Admitting: Emergency Medicine

## 2015-08-08 DIAGNOSIS — K2100 Gastro-esophageal reflux disease with esophagitis, without bleeding: Secondary | ICD-10-CM

## 2015-08-08 MED ORDER — DEXLANSOPRAZOLE 60 MG PO CPDR: DELAYED_RELEASE_CAPSULE | ORAL | Status: DC

## 2015-08-11 ENCOUNTER — Telehealth (HOSPITAL_COMMUNITY): Payer: Self-pay | Admitting: Hematology & Oncology

## 2015-08-11 ENCOUNTER — Other Ambulatory Visit (HOSPITAL_COMMUNITY): Payer: Self-pay | Admitting: Oncology

## 2015-08-11 DIAGNOSIS — K2100 Gastro-esophageal reflux disease with esophagitis, without bleeding: Secondary | ICD-10-CM

## 2015-08-11 MED ORDER — DEXLANSOPRAZOLE 60 MG PO CPDR: DELAYED_RELEASE_CAPSULE | ORAL | Status: DC

## 2015-08-28 ENCOUNTER — Ambulatory Visit (INDEPENDENT_AMBULATORY_CARE_PROVIDER_SITE_OTHER): Payer: Managed Care, Other (non HMO) | Admitting: Otolaryngology

## 2015-08-28 DIAGNOSIS — K219 Gastro-esophageal reflux disease without esophagitis: Secondary | ICD-10-CM

## 2015-08-28 DIAGNOSIS — J382 Nodules of vocal cords: Secondary | ICD-10-CM | POA: Diagnosis not present

## 2015-08-28 DIAGNOSIS — R490 Dysphonia: Secondary | ICD-10-CM | POA: Diagnosis not present

## 2015-09-08 ENCOUNTER — Other Ambulatory Visit: Payer: Self-pay | Admitting: *Deleted

## 2015-09-08 MED ORDER — CLOPIDOGREL BISULFATE 75 MG PO TABS: 75.0000 mg | ORAL_TABLET | Freq: Every day | ORAL | Status: DC

## 2015-09-11 ENCOUNTER — Other Ambulatory Visit: Payer: Self-pay

## 2015-09-11 MED ORDER — CLOPIDOGREL BISULFATE 75 MG PO TABS: 75.0000 mg | ORAL_TABLET | Freq: Every day | ORAL | Status: DC

## 2015-09-11 NOTE — Telephone Encounter (Signed)
Rx(s) sent to pharmacy electronically.  

## 2015-10-01 ENCOUNTER — Encounter (HOSPITAL_COMMUNITY): Payer: Managed Care, Other (non HMO) | Attending: Hematology & Oncology

## 2015-10-01 ENCOUNTER — Other Ambulatory Visit (HOSPITAL_COMMUNITY): Payer: Managed Care, Other (non HMO)

## 2015-10-01 ENCOUNTER — Ambulatory Visit (HOSPITAL_COMMUNITY): Payer: Managed Care, Other (non HMO) | Admitting: Oncology

## 2015-10-01 DIAGNOSIS — E611 Iron deficiency: Secondary | ICD-10-CM | POA: Diagnosis present

## 2015-10-01 LAB — CBC WITH DIFFERENTIAL/PLATELET
BASOS ABS: 0 10*3/uL (ref 0.0–0.1)
BASOS PCT: 0 %
EOS ABS: 0.1 10*3/uL (ref 0.0–0.7)
Eosinophils Relative: 2 %
HEMATOCRIT: 35 % — AB (ref 36.0–46.0)
Hemoglobin: 11.4 g/dL — ABNORMAL LOW (ref 12.0–15.0)
Lymphocytes Relative: 26 %
Lymphs Abs: 1 10*3/uL (ref 0.7–4.0)
MCH: 32.7 pg (ref 26.0–34.0)
MCHC: 32.6 g/dL (ref 30.0–36.0)
MCV: 100.3 fL — ABNORMAL HIGH (ref 78.0–100.0)
MONO ABS: 0.2 10*3/uL (ref 0.1–1.0)
MONOS PCT: 5 %
NEUTROS ABS: 2.7 10*3/uL (ref 1.7–7.7)
Neutrophils Relative %: 67 %
PLATELETS: 262 10*3/uL (ref 150–400)
RBC: 3.49 MIL/uL — ABNORMAL LOW (ref 3.87–5.11)
RDW: 15 % (ref 11.5–15.5)
WBC: 4 10*3/uL (ref 4.0–10.5)

## 2015-10-01 LAB — IRON AND TIBC
IRON: 41 ug/dL (ref 28–170)
SATURATION RATIOS: 16 % (ref 10.4–31.8)
TIBC: 262 ug/dL (ref 250–450)
UIBC: 221 ug/dL

## 2015-10-01 LAB — FERRITIN: Ferritin: 143 ng/mL (ref 11–307)

## 2015-10-03 ENCOUNTER — Other Ambulatory Visit (HOSPITAL_COMMUNITY): Payer: Managed Care, Other (non HMO)

## 2015-10-03 ENCOUNTER — Ambulatory Visit (HOSPITAL_COMMUNITY): Payer: Managed Care, Other (non HMO) | Admitting: Oncology

## 2015-10-08 ENCOUNTER — Ambulatory Visit (HOSPITAL_COMMUNITY): Payer: Managed Care, Other (non HMO) | Admitting: Oncology

## 2015-10-08 DIAGNOSIS — J383 Other diseases of vocal cords: Secondary | ICD-10-CM | POA: Insufficient documentation

## 2015-10-17 ENCOUNTER — Encounter (HOSPITAL_COMMUNITY): Payer: Managed Care, Other (non HMO)

## 2015-10-17 ENCOUNTER — Encounter (HOSPITAL_BASED_OUTPATIENT_CLINIC_OR_DEPARTMENT_OTHER): Payer: Managed Care, Other (non HMO) | Admitting: Oncology

## 2015-10-17 ENCOUNTER — Encounter (HOSPITAL_COMMUNITY): Payer: Self-pay | Admitting: Oncology

## 2015-10-17 ENCOUNTER — Other Ambulatory Visit (HOSPITAL_COMMUNITY): Payer: Self-pay | Admitting: Oncology

## 2015-10-17 VITALS — BP 111/80 | HR 76 | Temp 98.2°F | Resp 18 | Wt 141.5 lb

## 2015-10-17 DIAGNOSIS — D7589 Other specified diseases of blood and blood-forming organs: Secondary | ICD-10-CM

## 2015-10-17 DIAGNOSIS — E538 Deficiency of other specified B group vitamins: Secondary | ICD-10-CM

## 2015-10-17 DIAGNOSIS — E611 Iron deficiency: Secondary | ICD-10-CM | POA: Diagnosis not present

## 2015-10-17 HISTORY — DX: Other specified diseases of blood and blood-forming organs: D75.89

## 2015-10-17 HISTORY — DX: Deficiency of other specified B group vitamins: E53.8

## 2015-10-17 LAB — VITAMIN B12: VITAMIN B 12: 307 pg/mL (ref 180–914)

## 2015-10-17 LAB — FOLATE: Folate: 6.9 ng/mL (ref >5.9)

## 2015-10-17 MED ORDER — FOLIC ACID 1 MG PO TABS: 1.0000 mg | ORAL_TABLET | Freq: Every day | ORAL | Status: DC

## 2015-10-17 NOTE — Patient Instructions (Signed)
[place] at [facility] Discharge Instructions  RECOMMENDATIONS MADE BY THE CONSULTANT AND ANY TEST RESULTS WILL BE SENT TO YOUR REFERRING PHYSICIAN.  Labs today Labs in 3 months and 6 months  Return in 6 months for follow up  Thank you for choosing [place] at [facility] to provide your oncology and hematology care.  To afford each patient quality time with our provider, please arrive at least 15 minutes before your scheduled appointment time.   Beginning January 23rd 2017 lab work for the [facility] will be done in the  Main lab at [facility] on 1st floor. If you have a lab appointment with the [facility] please come in thru the  Main Entrance and check in at the main information desk  You need to re-schedule your appointment should you arrive 10 or more minutes late.  We strive to give you quality time with our providers, and arriving late affects you and other patients whose appointments are after yours.  Also, if you no show three or more times for appointments you may be dismissed from the clinic at the providers discretion.     Again, thank you for choosing [facility].  Our hope is that these requests will decrease the amount of time that you wait before being seen by our physicians.       _____________________________________________________________  Should you have questions after your visit to [facility], please contact our office at [phone] between the hours of 8:30 a.m. and 4:30 p.m.  Voicemails left after 4:30 p.m. will not be returned until the following business day.  For prescription refill requests, have your pharmacy contact our office.         Resources For Cancer Patients and their Caregivers ? American Cancer Society: Can assist with transportation, wigs, general needs, runs Look Good Feel Better.        574-508-8805 ? Cancer Care: Provides financial assistance, online  support groups, medication/co-pay assistance.  1-800-813-HOPE (609) 883-2125) ? [facility] Assists [place] cancer patients and their families through emotional , educational and financial support.  760-479-3984 ? [place] DSS Where to apply for food stamps, Medicaid and utility assistance. 502-690-7557 ? [facility]: Transportation to medical appointments. 765-292-1740 ? Social Security Administration: May apply for disability if have a Stage IV cancer. (406)537-8638 (712)049-2276 ? [employer], Disability and Transit Services: Assists with nutrition, care and transit needs. 602-806-8162

## 2015-10-17 NOTE — Assessment & Plan Note (Addendum)
Iron deficiency requiring IV iron replacement.  Colonoscopy and EGD performed in 02/2014 and both were negative for any signs of bleeding.   Now with developing macrocytosis requiring additional work-up.  Labs in 3 and 6 months: CBC diff, iron/TIBC, ferritin.    Additional labs in 6 months: renal function panel.  Return in 6 months for follow-up.  She does not qualify for lung cancer screening as she is not 56- 53 years old at this time.  She notes that she has a goal of smoking cessation over the next 3 months.  We discussed this in detail.  Smoking cessation education provided today.

## 2015-10-17 NOTE — Assessment & Plan Note (Signed)
Macrocytosis with anemia.  New since Dec 2016.  Labs today: folate, B12.  If above mentioned labs demonstrate deficiency, she will be recalled for antibody testing.  If tests are negative and macrocytosis with anemia persists, we may need to consider bone marrow aspiration and biopsy (if iron stores are completed replenished).

## 2015-10-17 NOTE — Progress Notes (Signed)
[name], MD [place] [address]  Iron deficiency - Plan: CBC with Differential, Ferritin, Iron and TIBC, CBC with Differential, Iron and TIBC, Ferritin, Renal function panel  Macrocytosis - Plan: Vitamin B12, Folate  CURRENT THERAPY:  IV iron as needed.  INTERVAL HISTORY Connie Farrell 56 y.o. female returns for followup of iron deficiency requiring IV iron replacement while taking ASA and Plavix.  I personally reviewed and went over laboratory results with the patient.  The results are noted within this dictation.  HGB is 11.4 g/dL with a normal ferritin of 143.  Macrocytosis is noted beginning in December 2016.  She denies any complaints today.  She is accompanied by her daughter this morning.  I reviewed her labs with her as mentioned above.  I am concerned of her persistent anemia in the setting of normal iron studies and macrocytosis.  She is educated that B12 and Folate deficiency can play a role in this and she is agreeable to have additional lab testing today.  She knows that if she is deficient, we will replace and perform antibody testing.  She reports that she has a plan in place for upcoming smoking cessation in the next 3 months.  We reviewed this in detail today and she is provided additional smoking cessation education today.    I personally reviewed and went over radiographic studies with the patient.  The results are noted within this dictation.  She is overdue for mammogram and she notes that she has an upcoming appointment with her primary care provider for a physical.  She notes that her PCP will address this.  She denies any blood in her stool and dark tarry stool.   Past Medical History  Diagnosis Date  . Hypertension   . Seasonal allergies   . Neck pain, chronic   . Hyperlipemia   . PAD (peripheral artery disease) ([place])   . Exertional dyspnea   . GERD (gastroesophageal reflux disease)   . Arthritis     "hands; spine too"  (04/04/2012)  . Anxiety   . Tobacco abuse   . Renal artery stenosis ([place])   . Macrocytosis 10/17/2015    has Fracture, ankle; Ankle fracture; PAD (peripheral artery disease) ([place]); HTN (hypertension); Tobacco abuse; HLD (hyperlipidemia); Claudication ([place]); Acute appendicitis; Perforated appendicitis; Renal artery stenosis ([place]); Dysphagia, unspecified(787.20); RLQ abdominal pain; Heart palpitations; Iron deficiency; GERD (gastroesophageal reflux disease); Hoarseness; and Macrocytosis on her problem list.     is allergic to latex and codeine.  [name] does not currently have medications on file.  Past Surgical History  Procedure Laterality Date  . Anterior cervical decomp/discectomy fusion  ~ 04/2006    C6, 7, T1  . Tubal ligation  1990  . Peripheral arterial stent graft  04/04/2012  . Laparoscopic appendectomy N/A 09/10/2013    Procedure: APPENDECTOMY LAPAROSCOPIC;  Surgeon: Jamesetta So, MD;  Location: [facility] ORS;  Service: General;  Laterality: N/A;  . Colonoscopy N/A 02/26/2014    Adequate preparation. Minimal anal canal hemorrhoids;otherwise, normal rectumdiminutive polyps in the middescending segment; otherwise, the remainder of colonic mucosaappeared normal. Hyperplastic polyps.  . Esophagogastroduodenoscopy N/A 02/26/2014    [id number] distal esophagus, query short segment Barrett's. Status post Venia Minks dilation with esophageal biopsy. Abnormal gastric mucosa. Esophageal biopsy negative for Barrett's. Gastric biopsy showed mild chronic gastritis, no H pylori.  Venia Minks dilation N/A 02/26/2014    Procedure: Venia Minks DILATION;  Surgeon: Daneil Dolin, MD;  Location: [facility] SU[facility];  Service: Endoscopy;  Laterality: N/A;  . Lower extremity angiogram N/A 04/04/2012    Procedure: LOWER EXTREMITY ANGIOGRAM;  Surgeon: Lorretta Harp, MD;  Location: [facility] CATH LAB;  Service: Cardiovascular;  Laterality: N/A;  . Abdominal angiogram  04/04/2012    Procedure: ABDOMINAL ANGIOGRAM;  Surgeon:  Lorretta Harp, MD;  Location: [facility] CATH LAB;  Service: Cardiovascular;;  . Renal angiogram Left 04/22/2014    Procedure: RENAL ANGIOGRAM;  Surgeon: Lorretta Harp, MD;  Location: [facility] CATH LAB;  Service: Cardiovascular;  Laterality: Left;    Denies any headaches, dizziness, double vision, fevers, chills, night sweats, nausea, vomiting, diarrhea, constipation, chest pain, heart palpitations, shortness of breath, blood in stool, black tarry stool, urinary pain, urinary burning, urinary frequency, hematuria.   PHYSICAL EXAMINATION  ECOG PERFORMANCE STATUS: 0 - Asymptomatic  Filed Vitals:   10/17/15 0913  BP: 111/80  Pulse: 76  Temp: 98.2 F (36.8 C)  Resp: 18    GENERAL:alert, no distress, well nourished, well developed, comfortable, cooperative and smiling, accompanied by her daughter. SKIN: skin color, texture, turgor are normal, no rashes or significant lesions HEAD: Normocephalic, No masses, lesions, tenderness or abnormalities EYES: normal, PERRLA, EOMI, Conjunctiva are pink and non-injected EARS: External ears normal OROPHARYNX:lips, buccal mucosa, and tongue normal and mucous membranes are moist  NECK: supple, no adenopathy, no stridor, non-tender, trachea midline LYMPH:  no palpable lymphadenopathy, no hepatosplenomegaly BREAST:not examined LUNGS: clear to auscultation and percussion with decreased breath sounds bilaterally without wheezes, rales, or rhonchi. HEART: regular rate & rhythm, no murmurs, no gallops, S1 normal and S2 normal ABDOMEN:abdomen soft, non-tender, normal bowel sounds and no masses or organomegaly BACK: Back symmetric, no curvature., No CVA tenderness EXTREMITIES:less then 2 second capillary refill, no joint deformities, effusion, or inflammation, no edema, no skin discoloration, no clubbing, no cyanosis  NEURO: alert & oriented x 3 with fluent speech, no focal motor/sensory deficits, gait normal   LABORATORY DATA: CBC    Component Value Date/Time    WBC 4.0 10/01/2015 1333   RBC 3.49* 10/01/2015 1333   RBC 4.18 05/30/2014 1610   HGB 11.4* 10/01/2015 1333   HCT 35.0* 10/01/2015 1333   PLT 262 10/01/2015 1333   MCV 100.3* 10/01/2015 1333   MCH 32.7 10/01/2015 1333   MCHC 32.6 10/01/2015 1333   RDW 15.0 10/01/2015 1333   LYMPHSABS 1.0 10/01/2015 1333   MONOABS 0.2 10/01/2015 1333   EOSABS 0.1 10/01/2015 1333   BASOSABS 0.0 10/01/2015 1333      Chemistry      Component Value Date/Time   NA 142 05/30/2014 1610   K 3.8 05/30/2014 1610   CL 105 05/30/2014 1610   CO2 24 05/30/2014 1610   BUN 17 05/30/2014 1610   CREATININE 1.27* 05/30/2014 1610   CREATININE 1.36* 04/16/2014 1034      Component Value Date/Time   CALCIUM 9.4 05/30/2014 1610   ALKPHOS 110 05/30/2014 1610   AST 12 05/30/2014 1610   ALT 9 05/30/2014 1610   BILITOT 0.2* 05/30/2014 1610     Lab Results  Component Value Date   IRON 41 10/01/2015   TIBC 262 10/01/2015   FERRITIN 143 10/01/2015    ASSESSMENT AND PLAN:  Iron deficiency Iron deficiency requiring IV iron replacement.  Colonoscopy and EGD performed in 02/2014 and both were negative for any signs of bleeding.   Now with developing macrocytosis requiring additional work-up.  Labs in 3 and 6 months: CBC diff, iron/TIBC, ferritin.  Additional labs in 6 months: renal function panel.  Return in 6 months for follow-up.  She does not qualify for lung cancer screening as she is not 18- 69 years old at this time.  She notes that she has a goal of smoking cessation over the next 3 months.  We discussed this in detail.  Smoking cessation education provided today.  Macrocytosis Macrocytosis with anemia.  New since Dec 2016.  Labs today: folate, B12.  If above mentioned labs demonstrate deficiency, she will be recalled for antibody testing.  If tests are negative and macrocytosis with anemia persists, we may need to consider bone marrow aspiration and biopsy (if iron stores are completed  replenished).   THERAPY PLAN:  We will continue with iron support as needed.  All questions were answered. The patient knows to call the clinic with any problems, questions or concerns. We can certainly see the patient much sooner if necessary.  Patient and plan discussed with Dr. Ancil Linsey and she is in agreement with the aforementioned.   This note is electronically signed by: Robynn Pane 10/17/2015 9:48 AM

## 2015-10-22 MED ORDER — FOLIC ACID 1 MG PO TABS: 1.0000 mg | ORAL_TABLET | Freq: Every day | ORAL | Status: DC

## 2015-11-11 ENCOUNTER — Other Ambulatory Visit (HOSPITAL_COMMUNITY): Payer: Self-pay | Admitting: Internal Medicine

## 2015-11-11 DIAGNOSIS — Z1231 Encounter for screening mammogram for malignant neoplasm of breast: Secondary | ICD-10-CM

## 2015-11-17 ENCOUNTER — Ambulatory Visit (HOSPITAL_COMMUNITY)
Admission: RE | Admit: 2015-11-17 | Discharge: 2015-11-17 | Disposition: A | Discharge status: Home / Self Care | Payer: Managed Care, Other (non HMO) | Source: Ambulatory Visit | Attending: Internal Medicine | Admitting: Internal Medicine

## 2015-11-17 DIAGNOSIS — Z1231 Encounter for screening mammogram for malignant neoplasm of breast: Secondary | ICD-10-CM

## 2016-01-16 ENCOUNTER — Encounter (HOSPITAL_COMMUNITY): Payer: Managed Care, Other (non HMO) | Attending: Hematology & Oncology

## 2016-01-16 DIAGNOSIS — E611 Iron deficiency: Secondary | ICD-10-CM | POA: Insufficient documentation

## 2016-01-16 LAB — CBC WITH DIFFERENTIAL/PLATELET
Basophils Absolute: 0 10*3/uL (ref 0.0–0.1)
Basophils Relative: 1 %
EOS ABS: 0.1 10*3/uL (ref 0.0–0.7)
EOS PCT: 3 %
HCT: 38.7 % (ref 36.0–46.0)
Hemoglobin: 12.4 g/dL (ref 12.0–15.0)
LYMPHS ABS: 0.9 10*3/uL (ref 0.7–4.0)
Lymphocytes Relative: 22 %
MCH: 31.5 pg (ref 26.0–34.0)
MCHC: 32 g/dL (ref 30.0–36.0)
MCV: 98.2 fL (ref 78.0–100.0)
MONO ABS: 0.1 10*3/uL (ref 0.1–1.0)
MONOS PCT: 4 %
Neutro Abs: 2.7 10*3/uL (ref 1.7–7.7)
Neutrophils Relative %: 70 %
PLATELETS: 226 10*3/uL (ref 150–400)
RBC: 3.94 MIL/uL (ref 3.87–5.11)
RDW: 13.8 % (ref 11.5–15.5)
WBC: 3.8 10*3/uL — ABNORMAL LOW (ref 4.0–10.5)

## 2016-01-16 LAB — IRON AND TIBC
Iron: 71 ug/dL (ref 28–170)
Saturation Ratios: 14 % (ref 10.4–31.8)
TIBC: 510 ug/dL — ABNORMAL HIGH (ref 250–450)
UIBC: 439 ug/dL

## 2016-01-16 LAB — FERRITIN: FERRITIN: 30 ng/mL (ref 11–307)

## 2016-01-18 ENCOUNTER — Other Ambulatory Visit (HOSPITAL_COMMUNITY): Payer: Self-pay | Admitting: Oncology

## 2016-01-28 ENCOUNTER — Other Ambulatory Visit (HOSPITAL_COMMUNITY): Payer: Self-pay | Admitting: Oncology

## 2016-01-28 DIAGNOSIS — K2100 Gastro-esophageal reflux disease with esophagitis, without bleeding: Secondary | ICD-10-CM

## 2016-01-28 MED ORDER — DEXLANSOPRAZOLE 60 MG PO CPDR: DELAYED_RELEASE_CAPSULE | ORAL | Status: DC

## 2016-02-02 ENCOUNTER — Encounter (HOSPITAL_COMMUNITY): Payer: Managed Care, Other (non HMO) | Attending: Hematology & Oncology

## 2016-02-02 VITALS — BP 125/72 | HR 82 | Temp 97.7°F | Resp 18

## 2016-02-02 DIAGNOSIS — E611 Iron deficiency: Secondary | ICD-10-CM

## 2016-02-02 MED ORDER — SODIUM CHLORIDE 0.9 % IV SOLN
510.0000 mg | Freq: Once | INTRAVENOUS | Status: AC
  Administered 2016-02-02: 510 mg via INTRAVENOUS
  Filled 2016-02-02: qty 17

## 2016-02-02 MED ORDER — SODIUM CHLORIDE 0.9 % IV SOLN
Freq: Once | INTRAVENOUS | Status: AC
  Administered 2016-02-02: 15:00:00 via INTRAVENOUS

## 2016-02-02 NOTE — Progress Notes (Signed)
Patient tolerated infusion well.  VSS.   

## 2016-02-02 NOTE — Patient Instructions (Signed)
[place] at [facility] Discharge Instructions  RECOMMENDATIONS MADE BY THE CONSULTANT AND ANY TEST RESULTS WILL BE SENT TO YOUR REFERRING PHYSICIAN.  IV iron infusion.    Thank you for choosing [place] [facility] to provide your oncology and hematology care.  To afford each patient quality time with our provider, please arrive at least 15 minutes before your scheduled appointment time.   Beginning January 23rd 2017 lab work for the [facility] will be done in the  Main lab at [facility] on 1st floor. If you have a lab appointment with the [facility] please come in thru the  Main Entrance and check in at the main information desk  You need to re-schedule your appointment should you arrive 10 or more minutes late.  We strive to give you quality time with our providers, and arriving late affects you and other patients whose appointments are after yours.  Also, if you no show three or more times for appointments you may be dismissed from the clinic at the providers discretion.     Again, thank you for choosing [facility].  Our hope is that these requests will decrease the amount of time that you wait before being seen by our physicians.       _____________________________________________________________  Should you have questions after your visit to [facility], please contact our office at [phone] between the hours of 8:30 a.m. and 4:30 p.m.  Voicemails left after 4:30 p.m. will not be returned until the following business day.  For prescription refill requests, have your pharmacy contact our office.         Resources For Cancer Patients and their Caregivers ? American Cancer Society: Can assist with transportation, wigs, general needs, runs Look Good Feel Better.        617-199-4586 ? Cancer Care: Provides financial assistance, online support groups, medication/co-pay assistance.   1-800-813-HOPE (443) 796-9750) ? [place] Assists [place] cancer patients and their families through emotional , educational and financial support.  (305)827-2130 ? [place] DSS Where to apply for food stamps, Medicaid and utility assistance. 640-727-9150 ? [facility]: Transportation to medical appointments. (517)801-8849 ? Social Security Administration: May apply for disability if have a Stage IV cancer. (408)021-9756 780-534-3775 ? [employer], Disability and Transit Services: Assists with nutrition, care and transit needs. [place] Support Programs: '@10RELATIVEDAYS'$ @ > Cancer Support Group  2nd Tuesday of the month 1pm-2pm, Journey Room  > Creative Journey  3rd Tuesday of the month 1130am-1pm, Journey Room  > Look Good Feel Better  1st Wednesday of the month 10am-12 noon, Journey Room (Call [place] to register [phone])

## 2016-02-17 ENCOUNTER — Encounter: Payer: Self-pay | Admitting: Cardiovascular Disease

## 2016-02-17 ENCOUNTER — Ambulatory Visit (INDEPENDENT_AMBULATORY_CARE_PROVIDER_SITE_OTHER): Payer: Managed Care, Other (non HMO) | Admitting: Cardiovascular Disease

## 2016-02-17 VITALS — BP 136/78 | HR 79 | Ht 65.0 in | Wt 142.0 lb

## 2016-02-17 DIAGNOSIS — I739 Peripheral vascular disease, unspecified: Secondary | ICD-10-CM | POA: Diagnosis not present

## 2016-02-17 DIAGNOSIS — R002 Palpitations: Secondary | ICD-10-CM | POA: Insufficient documentation

## 2016-02-17 DIAGNOSIS — I10 Essential (primary) hypertension: Secondary | ICD-10-CM | POA: Diagnosis not present

## 2016-02-17 NOTE — Assessment & Plan Note (Signed)
History of hypertension blood pressure measures 136/78. She is on lisinopril, hydrochlorothiazide and metoprolol. Continue current meds at current dosing

## 2016-02-17 NOTE — Progress Notes (Signed)
02/17/2016 Connie Farrell   08-28-1960  [id number]  Primary Physician [name], MD Primary Cardiologist: Lorretta Harp MD Renae Gloss  HPI:  The patient is a 55 year old, thin appearing, married, Caucasian female, mother of two, grandmother to one grandchild, referred to me by [name] for peripheral vascular evaluation. I last saw her in the office 03/19/15. She currently sees doctors on call. .  Her cardiac risk factor profile is positive for a 30 pack year of tobacco abuse, currently smoking 0.75 packs per day. Recalcitrant to risk factor modification. I did counsel her for greater than three minutes. Her other problems include hypertension and hyperlipidemia, as well as a strong family history for heart disease. She had a negative Myoview performed by [name] at [facility]. She did complain of claudication with Dopplers that suggested a mildly diminished left ABI performed at [facility] and confirmed in our lab suggesting of high grade left common iliac artery stenosis. I performed an angiogram on her April 04, 2012, confirming this with a 40-mm pull-back gradient. I stented her left common iliac artery with result in improvement in her left ABI from 0.82 to 0.96. Improvement in her Dopplers and claudication symptoms as well. We have been following her renal Dopplers closely which is usually has shown a significant progression of disease with decline in her pole to pole dimension the dimension from 10.7-9.2 cm an increase in her serum creatinine from 1.2-2.0. Her primary care physician subsequently discontinued her ACE inhibitor.I performed angiography on her 04/22/14 revealing a 60% left renal artery stenosis with a 20 mm Hg gradient. . She also had a widely patent left iliac stent. I decided to treat this medically. She denies chest pain or shortness of breath. Since I saw her year ago she developed progressive left lower extremity recurrent claudication and  new onset palpitations.   Current Outpatient Prescriptions  Medication Sig Dispense Refill  . aspirin EC 81 MG tablet Take 81 mg by mouth daily.    Marland Kitchen atorvastatin (LIPITOR) 20 MG tablet Take 20 mg by mouth daily.    . clopidogrel (PLAVIX) 75 MG tablet Take 1 tablet (75 mg total) by mouth daily. 90 tablet 1  . cyclobenzaprine (FLEXERIL) 10 MG tablet Take 5-10 mg by mouth 3 (three) times daily as needed for muscle spasms.     Marland Kitchen dexlansoprazole (DEXILANT) 60 MG capsule TAKE (1) CAPSULE BY MOUTH ONCE DAILY. 90 capsule 1  . diphenhydrAMINE (BENADRYL) 25 MG tablet Take 25 mg by mouth every 6 (six) hours as needed.    Marland Kitchen escitalopram (LEXAPRO) 10 MG tablet Take 10 mg by mouth Daily.    . folic acid (FOLVITE) 1 MG tablet Take 1 tablet (1 mg total) by mouth daily. 90 tablet 4  . lisinopril-hydrochlorothiazide (PRINZIDE,ZESTORETIC) 10-12.5 MG per tablet Take 1 tablet by mouth daily.     . metoprolol tartrate (LOPRESSOR) 25 MG tablet Take 0.5 tablets (12.5 mg total) by mouth 2 (two) times daily as needed. 90 tablet 2  . ranitidine (ZANTAC) 150 MG tablet Take 150 mg by mouth at bedtime.    . traMADol (ULTRAM) 50 MG tablet Take 50 mg by mouth every 6 (six) hours as needed.      No current facility-administered medications for this visit.     Allergies  Allergen Reactions  . Latex Rash    Blisters, rash  . Codeine Nausea And Vomiting    Social History   Social History  .  Marital status: Married    Spouse name: N/A  . Number of children: N/A  . Years of education: N/A   Occupational History  . [place]    [address]   Social History Main Topics  . Smoking status: Current Every Day Smoker    Packs/day: 0.75    Years: 36.00    Types: Cigarettes  . Smokeless tobacco: Never Used  . Alcohol use No  . Drug use: No  . Sexual activity: No   Other Topics Concern  . Not on file   Social History Narrative  . No narrative on file     Review of Systems: General:  negative for chills, fever, night sweats or weight changes.  Cardiovascular: negative for chest pain, dyspnea on exertion, edema, orthopnea, palpitations, paroxysmal nocturnal dyspnea or shortness of breath Dermatological: negative for rash Respiratory: negative for cough or wheezing Urologic: negative for hematuria Abdominal: negative for nausea, vomiting, diarrhea, bright red blood per rectum, melena, or hematemesis Neurologic: negative for visual changes, syncope, or dizziness All other systems reviewed and are otherwise negative except as noted above.    Blood pressure 136/78, pulse 79, height '5\' 5"'$  (1.651 m), weight 142 lb (64.4 kg).  General appearance: alert and no distress Neck: no adenopathy, no carotid bruit, no JVD, supple, symmetrical, trachea midline and thyroid not enlarged, symmetric, no tenderness/mass/nodules Lungs: clear to auscultation bilaterally Heart: regular rate and rhythm, S1, S2 normal, no murmur, click, rub or gallop Extremities: extremities normal, atraumatic, no cyanosis or edema  EKG normal sinus rhythm at 79 nonspecific ST and T-wave changes. I personally reviewed this EKG  ASSESSMENT AND PLAN:   PAD (peripheral artery disease) History of peripheral arterial disease status post left iliac stenting by myself 04/04/12. She had a 40 mm gradient on pullback. Post stenting her ABI improved from 22 up to 0.96. I re-angiogramed her 04/22/14 revealing a widely patent iliac stent. Since I saw her last she is noticed progressive left lower extremity claudication. We will recheck lower extremity arterial Doppler studies.  HTN (hypertension) History of hypertension blood pressure measures 136/78. She is on lisinopril, hydrochlorothiazide and metoprolol. Continue current meds at current dosing  Tobacco abuse History of continued tobacco abuse smoking one half pack per day, recalcitrant to risk factor modification.  HLD (hyperlipidemia) History of hyperlipidemia on  statin therapy followed by her PCP  Renal artery stenosis History of 60% left renal artery stenosis demonstrated on angiography 04/22/14 with a 20 mm gradient. The following is by duplex ultrasound.  Palpitations The patient has been complaining of palpitations the last several weeks. We will check a two-week event monitor.      Lorretta Harp MD FACP,FACC,FAHA, [facility] 02/17/2016 11:31 AM

## 2016-02-17 NOTE — Assessment & Plan Note (Signed)
History of 60% left renal artery stenosis demonstrated on angiography 04/22/14 with a 20 mm gradient. The following is by duplex ultrasound.

## 2016-02-17 NOTE — Assessment & Plan Note (Signed)
History of peripheral arterial disease status post left iliac stenting by myself 04/04/12. She had a 40 mm gradient on pullback. Post stenting her ABI improved from 22 up to 0.96. I re-angiogramed her 04/22/14 revealing a widely patent iliac stent. Since I saw her last she is noticed progressive left lower extremity claudication. We will recheck lower extremity arterial Doppler studies.

## 2016-02-17 NOTE — Patient Instructions (Signed)
Medication Instructions:  Your physician recommends that you continue on your current medications as directed. Please refer to the Current Medication list given to you today.   Testing/Procedures: Your physician has requested that you have a lower extremity arterial doppler- During this test, ultrasound is used to evaluate arterial blood flow in the legs. Allow approximately one hour for this exam.   Your physician has recommended that you wear an event monitor. Event monitors are medical devices that record the heart's electrical activity. Doctors most often [place] these monitors to diagnose arrhythmias. Arrhythmias are problems with the speed or rhythm of the heartbeat. The monitor is a small, portable device. You can wear one while you do your normal daily activities. This is usually used to diagnose what is causing palpitations/syncope (passing out). 2 WEEKS  Follow-Up: Your physician recommends that you schedule a follow-up appointment in: [place].   If you need a refill on your cardiac medications before your next appointment, please call your pharmacy.

## 2016-02-17 NOTE — Assessment & Plan Note (Signed)
History of continued tobacco abuse smoking one half pack per day, recalcitrant to risk factor modification.

## 2016-02-17 NOTE — Assessment & Plan Note (Signed)
The patient has been complaining of palpitations the last several weeks. We will check a two-week event monitor.

## 2016-02-17 NOTE — Assessment & Plan Note (Signed)
History of hyperlipidemia on statin therapy followed by her PCP. 

## 2016-02-18 ENCOUNTER — Ambulatory Visit (INDEPENDENT_AMBULATORY_CARE_PROVIDER_SITE_OTHER): Payer: Managed Care, Other (non HMO)

## 2016-02-18 DIAGNOSIS — R002 Palpitations: Secondary | ICD-10-CM | POA: Diagnosis not present

## 2016-02-18 DIAGNOSIS — I10 Essential (primary) hypertension: Secondary | ICD-10-CM | POA: Diagnosis not present

## 2016-02-18 DIAGNOSIS — I739 Peripheral vascular disease, unspecified: Secondary | ICD-10-CM | POA: Diagnosis not present

## 2016-02-27 ENCOUNTER — Other Ambulatory Visit: Payer: Self-pay | Admitting: Cardiovascular Disease

## 2016-02-27 DIAGNOSIS — I739 Peripheral vascular disease, unspecified: Secondary | ICD-10-CM

## 2016-02-28 ENCOUNTER — Other Ambulatory Visit: Payer: Self-pay | Admitting: Cardiovascular Disease

## 2016-03-01 ENCOUNTER — Other Ambulatory Visit: Payer: Self-pay | Admitting: Cardiovascular Disease

## 2016-03-01 DIAGNOSIS — I701 Atherosclerosis of renal artery: Secondary | ICD-10-CM

## 2016-03-02 ENCOUNTER — Ambulatory Visit (HOSPITAL_COMMUNITY)
Admission: RE | Admit: 2016-03-02 | Discharge: 2016-03-02 | Disposition: A | Discharge status: Home / Self Care | Payer: Managed Care, Other (non HMO) | Source: Ambulatory Visit | Attending: Cardiovascular Disease | Admitting: Cardiovascular Disease

## 2016-03-02 ENCOUNTER — Encounter (HOSPITAL_COMMUNITY): Payer: Self-pay

## 2016-03-02 DIAGNOSIS — Z720 Tobacco use: Secondary | ICD-10-CM | POA: Insufficient documentation

## 2016-03-02 DIAGNOSIS — E785 Hyperlipidemia, unspecified: Secondary | ICD-10-CM | POA: Diagnosis not present

## 2016-03-02 DIAGNOSIS — F419 Anxiety disorder, unspecified: Secondary | ICD-10-CM | POA: Insufficient documentation

## 2016-03-02 DIAGNOSIS — I739 Peripheral vascular disease, unspecified: Secondary | ICD-10-CM | POA: Diagnosis not present

## 2016-03-02 DIAGNOSIS — K219 Gastro-esophageal reflux disease without esophagitis: Secondary | ICD-10-CM | POA: Diagnosis not present

## 2016-03-02 DIAGNOSIS — I10 Essential (primary) hypertension: Secondary | ICD-10-CM

## 2016-03-02 DIAGNOSIS — I701 Atherosclerosis of renal artery: Secondary | ICD-10-CM

## 2016-03-03 ENCOUNTER — Other Ambulatory Visit: Payer: Self-pay | Admitting: *Deleted

## 2016-03-03 DIAGNOSIS — I701 Atherosclerosis of renal artery: Secondary | ICD-10-CM

## 2016-03-26 ENCOUNTER — Encounter: Payer: Self-pay | Admitting: Cardiovascular Disease

## 2016-03-26 ENCOUNTER — Ambulatory Visit (INDEPENDENT_AMBULATORY_CARE_PROVIDER_SITE_OTHER): Payer: Managed Care, Other (non HMO) | Admitting: Cardiovascular Disease

## 2016-03-26 VITALS — BP 130/78 | HR 76 | Ht 65.0 in | Wt 140.0 lb

## 2016-03-26 DIAGNOSIS — I701 Atherosclerosis of renal artery: Secondary | ICD-10-CM | POA: Diagnosis not present

## 2016-03-26 DIAGNOSIS — I739 Peripheral vascular disease, unspecified: Secondary | ICD-10-CM | POA: Diagnosis not present

## 2016-03-26 NOTE — Patient Instructions (Addendum)
Medication Instructions:  NO CHANGES.   Testing/Procedures: Your physician has requested that you have a lower extremity arterial doppler- During this test, ultrasound is used to evaluate arterial blood flow in the legs. Allow approximately one hour for this exam. IN 1 YEAR, AUGUST 2018.   Follow-Up: Your physician wants you to follow-up in: 51 MONTHS WITH DR Gwenlyn Found AFTER YOU HAVE HAD YOU DOPPLERS COMPLETED. You will receive a reminder letter in the mail two months in advance. If you don't receive a letter, please call our office to schedule the follow-up appointment.   If you need a refill on your cardiac medications before your next appointment, please call your pharmacy.

## 2016-03-26 NOTE — Assessment & Plan Note (Signed)
History of left renal artery stenosis with intravascular documented 60% proximal left renal artery stenosis 04/22/14 with a 20 mm gradient. We continue to follow her by duplex ultrasound which has remained stable. Her blood pressure is well-controlled.

## 2016-03-26 NOTE — Assessment & Plan Note (Signed)
An event monitor showed sinus rhythm with rare PVCs. She is minimally affected with palpitations currently.

## 2016-03-26 NOTE — Assessment & Plan Note (Signed)
History of peripheral arterial disease status post left common iliac artery stenting by myself 04/04/12. She has had serial lower extremity Doppler studies since that time on annual basis which show normal ABIs with only mild increase in velocities. She does however complain of left calf claudication. We'll continue to follow her noninvasively and I will see her back in one year.

## 2016-03-26 NOTE — Progress Notes (Signed)
Connie Farrell returns today for follow up of her outpatient diagnostic test. An event monitor ordered because of palpitation showed sinus rhythm with occasional PVCs. Lower extremity Dopplers revealed ABIs of 1 bilaterally with mildly elevated velocities in the left not significantly different from her prior Doppler one year ago. Her renal Dopplers have remained stable as well. We'll continue to follow her as an outpatient.

## 2016-03-31 HISTORY — PX: VASCULAR SURGERY: SHX849

## 2016-04-21 ENCOUNTER — Encounter (HOSPITAL_COMMUNITY): Payer: Self-pay | Admitting: Oncology

## 2016-04-21 ENCOUNTER — Encounter (HOSPITAL_COMMUNITY): Payer: Managed Care, Other (non HMO) | Attending: Oncology | Admitting: Oncology

## 2016-04-21 ENCOUNTER — Encounter (HOSPITAL_COMMUNITY): Payer: Managed Care, Other (non HMO)

## 2016-04-21 VITALS — BP 144/88 | HR 75 | Temp 98.0°F | Resp 18 | Wt 142.0 lb

## 2016-04-21 DIAGNOSIS — Z23 Encounter for immunization: Secondary | ICD-10-CM

## 2016-04-21 DIAGNOSIS — E538 Deficiency of other specified B group vitamins: Secondary | ICD-10-CM | POA: Insufficient documentation

## 2016-04-21 DIAGNOSIS — E611 Iron deficiency: Secondary | ICD-10-CM | POA: Diagnosis not present

## 2016-04-21 DIAGNOSIS — Z720 Tobacco use: Secondary | ICD-10-CM

## 2016-04-21 DIAGNOSIS — D7589 Other specified diseases of blood and blood-forming organs: Secondary | ICD-10-CM | POA: Diagnosis not present

## 2016-04-21 LAB — RENAL FUNCTION PANEL
ALBUMIN: 4.1 g/dL (ref 3.5–5.0)
Anion gap: 8 (ref 5–15)
BUN: 16 mg/dL (ref 6–20)
CHLORIDE: 106 mmol/L (ref 101–111)
CO2: 24 mmol/L (ref 22–32)
CREATININE: 1.31 mg/dL — AB (ref 0.44–1.00)
Calcium: 9.3 mg/dL (ref 8.9–10.3)
GFR calc Af Amer: 53 mL/min — ABNORMAL LOW (ref >60)
GFR, EST NON AFRICAN AMERICAN: 46 mL/min — AB (ref >60)
GLUCOSE: 107 mg/dL — AB (ref 65–99)
PHOSPHORUS: 2.8 mg/dL (ref 2.5–4.6)
Potassium: 3.8 mmol/L (ref 3.5–5.1)
Sodium: 138 mmol/L (ref 135–145)

## 2016-04-21 LAB — CBC WITH DIFFERENTIAL/PLATELET
Basophils Absolute: 0 10*3/uL (ref 0.0–0.1)
Basophils Relative: 1 %
Eosinophils Absolute: 0.1 10*3/uL (ref 0.0–0.7)
Eosinophils Relative: 3 %
HCT: 40.5 % (ref 36.0–46.0)
HEMOGLOBIN: 13.2 g/dL (ref 12.0–15.0)
LYMPHS ABS: 0.9 10*3/uL (ref 0.7–4.0)
LYMPHS PCT: 22 %
MCH: 32.5 pg (ref 26.0–34.0)
MCHC: 32.6 g/dL (ref 30.0–36.0)
MCV: 99.8 fL (ref 78.0–100.0)
Monocytes Absolute: 0.3 10*3/uL (ref 0.1–1.0)
Monocytes Relative: 6 %
NEUTROS ABS: 2.9 10*3/uL (ref 1.7–7.7)
NEUTROS PCT: 68 %
Platelets: 256 10*3/uL (ref 150–400)
RBC: 4.06 MIL/uL (ref 3.87–5.11)
RDW: 15.5 % (ref 11.5–15.5)
WBC: 4.3 10*3/uL (ref 4.0–10.5)

## 2016-04-21 LAB — IRON AND TIBC
Iron: 78 ug/dL (ref 28–170)
Saturation Ratios: 17 % (ref 10.4–31.8)
TIBC: 466 ug/dL — ABNORMAL HIGH (ref 250–450)
UIBC: 388 ug/dL

## 2016-04-21 LAB — FERRITIN: FERRITIN: 83 ng/mL (ref 11–307)

## 2016-04-21 LAB — FOLATE: FOLATE: 11.3 ng/mL (ref >5.9)

## 2016-04-21 MED ORDER — INFLUENZA VAC SPLIT QUAD 0.5 ML IM SUSY
0.5000 mL | PREFILLED_SYRINGE | Freq: Once | INTRAMUSCULAR | Status: AC
  Administered 2016-04-21: 0.5 mL via INTRAMUSCULAR
  Filled 2016-04-21: qty 0.5

## 2016-04-21 NOTE — Assessment & Plan Note (Signed)
Macrocytosis.  She developed a macrocytosis and therefore further testing was performed earlier this year.  She was found to have a low-normal folate level.  As a result, she was started on 1 mg of PO folic Acid daily.    Lab today: folate.

## 2016-04-21 NOTE — Progress Notes (Signed)
Connie Neighbors, MD [place] [address]  Iron deficiency  Folate deficiency - Plan: Folate  Need for prophylactic vaccination and inoculation against influenza - Plan: Influenza vac split quadrivalent PF (FLUARIX) injection 0.5 mL  CURRENT THERAPY:  IV iron as needed.  INTERVAL HISTORY Connie Farrell 55 y.o. female returns for followup of iron deficiency requiring IV iron replacement while taking ASA and Plavix.  She denies any blood in her stool or dark stool.  She denies any vaginal bleeding or spotting.  She denies hemoptysis or hematemesis.    Review of Systems  Constitutional: Negative.  Negative for chills and fever.  HENT: Negative.  Negative for nosebleeds.   Eyes: Negative.   Respiratory: Negative.  Negative for cough.   Cardiovascular: Negative.  Negative for chest pain.  Gastrointestinal: Negative.  Negative for blood in stool and melena.  Genitourinary: Negative for hematuria.  Musculoskeletal: Negative.   Skin: Negative.   Neurological: Negative.  Negative for weakness.  Endo/Heme/Allergies: Negative.  Does not bruise/bleed easily.  Psychiatric/Behavioral: Negative.       Past Medical History:  Diagnosis Date  . Anxiety   . Arthritis    "hands; spine too" (04/04/2012)  . Exertional dyspnea   . Folate deficiency 10/17/2015  . GERD (gastroesophageal reflux disease)   . Hyperlipemia   . Hypertension   . Macrocytosis 10/17/2015  . Neck pain, chronic   . PAD (peripheral artery disease) ([place])   . Renal artery stenosis ([place])   . Seasonal allergies   . Tobacco abuse     has Fracture, ankle; Ankle fracture; PAD (peripheral artery disease) ([place]); HTN (hypertension); Tobacco abuse; HLD (hyperlipidemia); Claudication ([place]); Acute appendicitis; Perforated appendicitis; Renal artery stenosis ([place]); Dysphagia, unspecified(787.20); RLQ abdominal pain; Heart palpitations; Iron deficiency; GERD (gastroesophageal reflux disease); Hoarseness;  Macrocytosis; Folate deficiency; and Palpitations on her problem list.     is allergic to latex and codeine.  We administered Influenza vac split quadrivalent PF.  Past Surgical History:  Procedure Laterality Date  . ABDOMINAL ANGIOGRAM  04/04/2012   Procedure: ABDOMINAL ANGIOGRAM;  Surgeon: Lorretta Harp, MD;  Location: [facility] CATH LAB;  Service: Cardiovascular;;  . ANTERIOR CERVICAL DECOMP/DISCECTOMY FUSION  ~ 04/2006   C6, 7, T1  . COLONOSCOPY N/A 02/26/2014   Adequate preparation. Minimal anal canal hemorrhoids;otherwise, normal rectumdiminutive polyps in the middescending segment; otherwise, the remainder of colonic mucosaappeared normal. Hyperplastic polyps.  . ESOPHAGOGASTRODUODENOSCOPY N/A 02/26/2014   LZJ:[id number] distal esophagus, query short segment Barrett's. Status post Venia Minks dilation with esophageal biopsy. Abnormal gastric mucosa. Esophageal biopsy negative for Barrett's. Gastric biopsy showed mild chronic gastritis, no H pylori.  Marland Kitchen LAPAROSCOPIC APPENDECTOMY N/A 09/10/2013   Procedure: APPENDECTOMY LAPAROSCOPIC;  Surgeon: Jamesetta So, MD;  Location: [facility] ORS;  Service: General;  Laterality: N/A;  . LOWER EXTREMITY ANGIOGRAM N/A 04/04/2012   Procedure: LOWER EXTREMITY ANGIOGRAM;  Surgeon: Lorretta Harp, MD;  [facility] LAB;  Service: Cardiovascular;  Laterality: N/A;  . Venia Minks DILATION N/A 02/26/2014   Procedure: Venia Minks DILATION;  Surgeon: Daneil Dolin, MD;  Location: [facility] ENDO SUITE;  Service: Endoscopy;  Laterality: N/A;  . PERIPHERAL ARTERIAL STENT GRAFT  04/04/2012  . RENAL ANGIOGRAM Left 04/22/2014   Procedure: RENAL ANGIOGRAM;  Surgeon: Lorretta Harp, MD;  Location: [facility] CATH LAB;  Service: Cardiovascular;  Laterality: Left;  . TUBAL LIGATION  1990    PHYSICAL EXAMINATION  ECOG PERFORMANCE STATUS: 0 - Asymptomatic  Vitals:  04/21/16 1038  BP: (!) 144/88  Pulse: 75  Resp: 18  Temp: 98 F (36.7 C)    GENERAL:alert, no distress, well nourished, well  developed, comfortable, cooperative and smiling, unaccompanied. SKIN: skin color, texture, turgor are normal, no rashes or significant lesions HEAD: Normocephalic, No masses, lesions, tenderness or abnormalities EYES: normal, PERRLA, EOMI, Conjunctiva are pink and non-injected EARS: External ears normal OROPHARYNX:lips, buccal mucosa, and tongue normal and mucous membranes are moist  NECK: supple, no adenopathy, no stridor, non-tender, trachea midline LYMPH:  no palpable lymphadenopathy, no hepatosplenomegaly BREAST:not examined LUNGS: clear to auscultation and percussion with decreased breath sounds bilaterally without wheezes, rales, or rhonchi. HEART: regular rate & rhythm, no murmurs, no gallops, S1 normal and S2 normal ABDOMEN:abdomen soft, non-tender, normal bowel sounds and no masses or organomegaly BACK: Back symmetric, no curvature., No CVA tenderness EXTREMITIES:less then 2 second capillary refill, no joint deformities, effusion, or inflammation, no edema, no skin discoloration, no clubbing, no cyanosis  NEURO: alert & oriented x 3 with fluent speech, no focal motor/sensory deficits, gait normal   LABORATORY DATA: CBC    Component Value Date/Time   WBC 4.3 04/21/2016 0956   RBC 4.06 04/21/2016 0956   HGB 13.2 04/21/2016 0956   HCT 40.5 04/21/2016 0956   PLT 256 04/21/2016 0956   MCV 99.8 04/21/2016 0956   MCH 32.5 04/21/2016 0956   MCHC 32.6 04/21/2016 0956   RDW 15.5 04/21/2016 0956   LYMPHSABS 0.9 04/21/2016 0956   MONOABS 0.3 04/21/2016 0956   EOSABS 0.1 04/21/2016 0956   BASOSABS 0.0 04/21/2016 0956      Chemistry      Component Value Date/Time   NA 138 04/21/2016 0957   K 3.8 04/21/2016 0957   CL 106 04/21/2016 0957   CO2 24 04/21/2016 0957   BUN 16 04/21/2016 0957   CREATININE 1.31 (H) 04/21/2016 0957   CREATININE 1.36 (H) 04/16/2014 1034      Component Value Date/Time   CALCIUM 9.3 04/21/2016 0957   ALKPHOS 110 05/30/2014 1610   AST 12 05/30/2014  1610   ALT 9 05/30/2014 1610   BILITOT 0.2 (L) 05/30/2014 1610     Lab Results  Component Value Date   IRON 78 04/21/2016   TIBC 466 (H) 04/21/2016   FERRITIN 83 04/21/2016   Lab Results  Component Value Date   FOLATE 11.3 04/21/2016    ASSESSMENT AND PLAN:  Iron deficiency Iron deficiency requiring IV iron replacement.  Colonoscopy and EGD performed in 02/2014 and both were negative for any signs of bleeding.   Labs today: CBC diff, iron/TIBC, ferritin, renal function panel.  I personally reviewed and went over laboratory results with the patient.  The results are noted within this dictation.  Labs in 3 and 6 months: CBC diff, iron/TIBC, ferritin.    Labs in 6 months: renal function panel.  Return in 6 months for follow-up.  She does not qualify for lung cancer screening as she is not 66- 63 years old at this time.  Smoking cessation education provided today.  At this time, she is still smoking 1/2 ppd.  Folate deficiency Macrocytosis.  She developed a macrocytosis and therefore further testing was performed earlier this year.  She was found to have a low-normal folate level.  As a result, she was started on 1 mg of PO folic Acid daily.    Lab today: folate.  THERAPY PLAN:  We will continue with iron support as needed.  All questions were  answered. The patient knows to call the clinic with any problems, questions or concerns. We can certainly see the patient much sooner if necessary.  Patient and plan discussed with Dr. Ancil Linsey and she is in agreement with the aforementioned.   This note is electronically signed by: Robynn Pane 04/21/2016 4:52 PM

## 2016-04-21 NOTE — Assessment & Plan Note (Addendum)
Iron deficiency requiring IV iron replacement.  Colonoscopy and EGD performed in 02/2014 and both were negative for any signs of bleeding.   Labs today: CBC diff, iron/TIBC, ferritin, renal function panel.  I personally reviewed and went over laboratory results with the patient.  The results are noted within this dictation.  Labs in 3 and 6 months: CBC diff, iron/TIBC, ferritin.    Labs in 6 months: renal function panel.  Return in 6 months for follow-up.  She does not qualify for lung cancer screening as she is not 10- 4 years old at this time.  Smoking cessation education provided today.  At this time, she is still smoking 1/2 ppd.

## 2016-04-21 NOTE — Patient Instructions (Signed)
[place] at [facility] Discharge Instructions  RECOMMENDATIONS MADE BY THE CONSULTANT AND ANY TEST RESULTS WILL BE SENT TO YOUR REFERRING PHYSICIAN.  You were seen today by Kirby Crigler PA-C. You were given your flu shot today. Labs in 3 months. Labs and follow up in 6 months.   Thank you for choosing [place] at [facility] to provide your oncology and hematology care.  To afford each patient quality time with our provider, please arrive at least 15 minutes before your scheduled appointment time.   Beginning January 23rd 2017 lab work for the [facility] will be done in the  Main lab at [facility] on 1st floor. If you have a lab appointment with the [facility] please come in thru the  Main Entrance and check in at the main information desk  You need to re-schedule your appointment should you arrive 10 or more minutes late.  We strive to give you quality time with our providers, and arriving late affects you and other patients whose appointments are after yours.  Also, if you no show three or more times for appointments you may be dismissed from the clinic at the providers discretion.     Again, thank you for choosing [facility].  Our hope is that these requests will decrease the amount of time that you wait before being seen by our physicians.       _____________________________________________________________  Should you have questions after your visit to [facility], please contact our office at [phone] between the hours of 8:30 a.m. and 4:30 p.m.  Voicemails left after 4:30 p.m. will not be returned until the following business day.  For prescription refill requests, have your pharmacy contact our office.         Resources For Cancer Patients and their Caregivers ? American Cancer Society: Can assist with transportation, wigs, general needs, runs Look Good Feel Better.         618-168-2850 ? Cancer Care: Provides financial assistance, online support groups, medication/co-pay assistance.  1-800-813-HOPE 430-152-0517) ? [place] Assists [place] cancer patients and their families through emotional , educational and financial support.  774-772-0570 ? [place] DSS Where to apply for food stamps, Medicaid and utility assistance. (708)277-6642 ? RCATS: Transportation to medical appointments. (229)126-5263 ? Social Security Administration: May apply for disability if have a Stage IV cancer. 512-842-9935 408-469-3162 ? [employer], Disability and Transit Services: Assists with nutrition, care and transit needs. [place] Support Programs: '@10RELATIVEDAYS'$ @ > Cancer Support Group  2nd Tuesday of the month 1pm-2pm, Journey Room  > Creative Journey  3rd Tuesday of the month 1130am-1pm, Journey Room  > Look Good Feel Better  1st Wednesday of the month 10am-12 noon, Journey Room (Call [place] to register [phone])

## 2016-05-03 ENCOUNTER — Encounter (HOSPITAL_COMMUNITY): Payer: Self-pay

## 2016-05-03 ENCOUNTER — Encounter (HOSPITAL_BASED_OUTPATIENT_CLINIC_OR_DEPARTMENT_OTHER): Payer: Managed Care, Other (non HMO)

## 2016-05-03 VITALS — BP 139/76 | HR 73 | Temp 98.0°F | Resp 18

## 2016-05-03 DIAGNOSIS — E611 Iron deficiency: Secondary | ICD-10-CM

## 2016-05-03 MED ORDER — SODIUM CHLORIDE 0.9 % IV SOLN
510.0000 mg | Freq: Once | INTRAVENOUS | Status: AC
  Administered 2016-05-03: 510 mg via INTRAVENOUS
  Filled 2016-05-03: qty 17

## 2016-05-03 MED ORDER — SODIUM CHLORIDE 0.9 % IV SOLN
Freq: Once | INTRAVENOUS | Status: AC
  Administered 2016-05-03: 14:00:00 via INTRAVENOUS

## 2016-05-03 NOTE — Progress Notes (Signed)
Tolerated infusion w/o adverse reaction.  Alert, in no distress.  VSS.  Discharged ambulatory.  

## 2016-05-03 NOTE — Patient Instructions (Signed)
[employer] at [facility] Discharge Instructions  RECOMMENDATIONS MADE BY THE CONSULTANT AND ANY TEST RESULTS WILL BE SENT TO YOUR REFERRING PHYSICIAN.  Iron infusion today. Return as scheduled for lab work and office visit. Call the clinic prior to your next appointment if you should have any questions or concerns.   Thank you for choosing [place] at [facility] to provide your oncology and hematology care.  To afford each patient quality time with our provider, please arrive at least 15 minutes before your scheduled appointment time.   Beginning January 23rd 2017 lab work for the [facility] will be done in the  Main lab at [facility] on 1st floor. If you have a lab appointment with the [facility] please come in thru the  Main Entrance and check in at the main information desk  You need to re-schedule your appointment should you arrive 10 or more minutes late.  We strive to give you quality time with our providers, and arriving late affects you and other patients whose appointments are after yours.  Also, if you no show three or more times for appointments you may be dismissed from the clinic at the providers discretion.     Again, thank you for choosing [facility].  Our hope is that these requests will decrease the amount of time that you wait before being seen by our physicians.       _____________________________________________________________  Should you have questions after your visit to [facility], please contact our office at [phone] between the hours of 8:30 a.m. and 4:30 p.m.  Voicemails left after 4:30 p.m. will not be returned until the following business day.  For prescription refill requests, have your pharmacy contact our office.         Resources For Cancer Patients and their Caregivers ? American Cancer Society: Can assist with transportation, wigs, general needs, runs Look Good  Feel Better.        ([phone] ? Cancer Care: Provides financial assistance, online support groups, medication/co-pay assistance.  1-800-813-HOPE (787) 041-4793) ? [place] Assists [place] cancer patients and their families through emotional , educational and financial support.  (416)193-7457 ? [place] DSS Where to apply for food stamps, Medicaid and utility assistance. 854-398-1320 ? RCATS: Transportation to medical appointments. 3868545739 ? Social Security Administration: May apply for disability if have a Stage IV cancer. 469-324-6741 620-446-8714 ? [employer], Disability and Transit Services: Assists with nutrition, care and transit needs. [place] Support Programs: '@10RELATIVEDAYS'$ @ > Cancer Support Group  2nd Tuesday of the month 1pm-2pm, Journey Room  > Creative Journey  3rd Tuesday of the month 1130am-1pm, Journey Room  > Look Good Feel Better  1st Wednesday of the month 10am-12 noon, Journey Room (Call [place] to register [phone])

## 2016-07-14 ENCOUNTER — Other Ambulatory Visit (HOSPITAL_COMMUNITY): Payer: Self-pay

## 2016-07-14 DIAGNOSIS — K2100 Gastro-esophageal reflux disease with esophagitis, without bleeding: Secondary | ICD-10-CM

## 2016-07-14 MED ORDER — DEXLANSOPRAZOLE 60 MG PO CPDR: DELAYED_RELEASE_CAPSULE | ORAL | 1 refills | Status: DC

## 2016-07-14 NOTE — Telephone Encounter (Signed)
Received refill request from patients pharmacy for dexilant. Chart checked and refilled.

## 2016-07-20 ENCOUNTER — Other Ambulatory Visit (HOSPITAL_COMMUNITY): Payer: Self-pay | Admitting: *Deleted

## 2016-07-20 DIAGNOSIS — E538 Deficiency of other specified B group vitamins: Secondary | ICD-10-CM

## 2016-07-20 DIAGNOSIS — E611 Iron deficiency: Secondary | ICD-10-CM

## 2016-07-22 ENCOUNTER — Other Ambulatory Visit (HOSPITAL_COMMUNITY): Payer: Managed Care, Other (non HMO)

## 2016-08-17 ENCOUNTER — Encounter (HOSPITAL_COMMUNITY): Payer: Commercial Managed Care - PPO | Attending: Hematology & Oncology

## 2016-08-17 DIAGNOSIS — E611 Iron deficiency: Secondary | ICD-10-CM | POA: Insufficient documentation

## 2016-08-17 LAB — CBC WITH DIFFERENTIAL/PLATELET
BASOS PCT: 0 %
Basophils Absolute: 0 10*3/uL (ref 0.0–0.1)
EOS ABS: 0.3 10*3/uL (ref 0.0–0.7)
EOS PCT: 3 %
HCT: 37.4 % (ref 36.0–46.0)
HEMOGLOBIN: 12.4 g/dL (ref 12.0–15.0)
Lymphocytes Relative: 18 %
Lymphs Abs: 1.5 10*3/uL (ref 0.7–4.0)
MCH: 33.3 pg (ref 26.0–34.0)
MCHC: 33.2 g/dL (ref 30.0–36.0)
MCV: 100.5 fL — ABNORMAL HIGH (ref 78.0–100.0)
Monocytes Absolute: 0.4 10*3/uL (ref 0.1–1.0)
Monocytes Relative: 5 %
NEUTROS PCT: 74 %
Neutro Abs: 6.4 10*3/uL (ref 1.7–7.7)
PLATELETS: 288 10*3/uL (ref 150–400)
RBC: 3.72 MIL/uL — AB (ref 3.87–5.11)
RDW: 15.7 % — ABNORMAL HIGH (ref 11.5–15.5)
WBC: 8.5 10*3/uL (ref 4.0–10.5)

## 2016-08-17 LAB — COMPREHENSIVE METABOLIC PANEL
ALBUMIN: 3.5 g/dL (ref 3.5–5.0)
ALK PHOS: 46 U/L (ref 38–126)
ALT: 15 U/L (ref 14–54)
AST: 15 U/L (ref 15–41)
Anion gap: 8 (ref 5–15)
BUN: 20 mg/dL (ref 6–20)
CALCIUM: 9.1 mg/dL (ref 8.9–10.3)
CHLORIDE: 106 mmol/L (ref 101–111)
CO2: 26 mmol/L (ref 22–32)
Creatinine, Ser: 1.36 mg/dL — ABNORMAL HIGH (ref 0.44–1.00)
GFR calc Af Amer: 50 mL/min — ABNORMAL LOW (ref >60)
GFR calc non Af Amer: 44 mL/min — ABNORMAL LOW (ref >60)
GLUCOSE: 116 mg/dL — AB (ref 65–99)
Potassium: 3.5 mmol/L (ref 3.5–5.1)
SODIUM: 140 mmol/L (ref 135–145)
Total Bilirubin: 0.2 mg/dL — ABNORMAL LOW (ref 0.3–1.2)
Total Protein: 6.4 g/dL — ABNORMAL LOW (ref 6.5–8.1)

## 2016-08-17 LAB — IRON AND TIBC
Iron: 53 ug/dL (ref 28–170)
Saturation Ratios: 13 % (ref 10.4–31.8)
TIBC: 410 ug/dL (ref 250–450)
UIBC: 357 ug/dL

## 2016-08-17 LAB — FERRITIN: FERRITIN: 138 ng/mL (ref 11–307)

## 2016-08-18 ENCOUNTER — Other Ambulatory Visit (HOSPITAL_COMMUNITY): Payer: Self-pay | Admitting: Oncology

## 2016-09-02 ENCOUNTER — Encounter (HOSPITAL_COMMUNITY): Payer: Commercial Managed Care - PPO | Attending: Hematology & Oncology

## 2016-09-02 ENCOUNTER — Encounter (HOSPITAL_COMMUNITY): Payer: Self-pay

## 2016-09-02 VITALS — BP 151/83 | HR 80 | Temp 97.7°F | Resp 16

## 2016-09-02 DIAGNOSIS — E611 Iron deficiency: Secondary | ICD-10-CM

## 2016-09-02 MED ORDER — FERUMOXYTOL INJECTION 510 MG/17 ML
510.0000 mg | Freq: Once | INTRAVENOUS | Status: AC
  Administered 2016-09-02: 510 mg via INTRAVENOUS
  Filled 2016-09-02: qty 17

## 2016-09-02 MED ORDER — SODIUM CHLORIDE 0.9 % IV SOLN
Freq: Once | INTRAVENOUS | Status: AC
  Administered 2016-09-02: 14:00:00 via INTRAVENOUS

## 2016-09-02 NOTE — Progress Notes (Signed)
Feraheme given today per orders. Patient tolerated it well without problems. Vitals stable and discharged home from clinic ambulatory. Follow up as scheduled.  

## 2016-09-02 NOTE — Patient Instructions (Signed)
[place] at [facility] Discharge Instructions  RECOMMENDATIONS MADE BY THE CONSULTANT AND ANY TEST RESULTS WILL BE SENT TO YOUR REFERRING PHYSICIAN.  Feraheme given  Follow up as scheduled  Thank you for choosing [place] at [facility] to provide your oncology and hematology care.  To afford each patient quality time with our provider, please arrive at least 15 minutes before your scheduled appointment time.    If you have a lab appointment with the [facility] please come in thru the  Main Entrance and check in at the main information desk  You need to re-schedule your appointment should you arrive 10 or more minutes late.  We strive to give you quality time with our providers, and arriving late affects you and other patients whose appointments are after yours.  Also, if you no show three or more times for appointments you may be dismissed from the clinic at the providers discretion.     Again, thank you for choosing [facility].  Our hope is that these requests will decrease the amount of time that you wait before being seen by our physicians.       _____________________________________________________________  Should you have questions after your visit to [facility], please contact our office at [phone] between the hours of 8:30 a.m. and 4:30 p.m.  Voicemails left after 4:30 p.m. will not be returned until the following business day.  For prescription refill requests, have your pharmacy contact our office.       Resources For Cancer Patients and their Caregivers ? American Cancer Society: Can assist with transportation, wigs, general needs, runs Look Good Feel Better.        ([phone] ? Cancer Care: Provides financial assistance, online support groups, medication/co-pay assistance.  1-800-813-HOPE 276-656-6267) ? [place] Assists [place] cancer patients and their  families through emotional , educational and financial support.  828-679-1370 ? [place] DSS Where to apply for food stamps, Medicaid and utility assistance. [phone] ? RCATS: Transportation to medical appointments. 863-253-6047 ? Social Security Administration: May apply for disability if have a Stage IV cancer. [phone] 470-649-5907 ? [employer], Disability and Transit Services: Assists with nutrition, care and transit needs. [place] Support Programs: '@10RELATIVEDAYS'$ @ > Cancer Support Group  2nd Tuesday of the month 1pm-2pm, Journey Room  > Creative Journey  3rd Tuesday of the month 1130am-1pm, Journey Room  > Look Good Feel Better  1st Wednesday of the month 10am-12 noon, Journey Room (Call [place] to register [phone])

## 2016-09-17 ENCOUNTER — Other Ambulatory Visit: Payer: Self-pay

## 2016-09-17 MED ORDER — CLOPIDOGREL BISULFATE 75 MG PO TABS: 75.0000 mg | ORAL_TABLET | Freq: Every day | ORAL | 1 refills | Status: DC

## 2016-10-19 ENCOUNTER — Other Ambulatory Visit (HOSPITAL_COMMUNITY): Payer: Self-pay | Admitting: *Deleted

## 2016-10-19 DIAGNOSIS — E538 Deficiency of other specified B group vitamins: Secondary | ICD-10-CM

## 2016-10-19 DIAGNOSIS — E611 Iron deficiency: Secondary | ICD-10-CM

## 2016-10-20 ENCOUNTER — Encounter (HOSPITAL_COMMUNITY): Payer: Commercial Managed Care - PPO | Attending: Oncology

## 2016-10-20 ENCOUNTER — Encounter (HOSPITAL_COMMUNITY): Payer: Self-pay | Admitting: Adult Health

## 2016-10-20 ENCOUNTER — Encounter (HOSPITAL_COMMUNITY): Payer: Commercial Managed Care - PPO | Attending: Oncology | Admitting: Adult Health

## 2016-10-20 VITALS — BP 111/81 | HR 77 | Temp 97.7°F | Resp 16 | Ht 65.0 in | Wt 144.9 lb

## 2016-10-20 DIAGNOSIS — K2100 Gastro-esophageal reflux disease with esophagitis, without bleeding: Secondary | ICD-10-CM

## 2016-10-20 DIAGNOSIS — E538 Deficiency of other specified B group vitamins: Secondary | ICD-10-CM

## 2016-10-20 DIAGNOSIS — E611 Iron deficiency: Secondary | ICD-10-CM

## 2016-10-20 DIAGNOSIS — D509 Iron deficiency anemia, unspecified: Secondary | ICD-10-CM

## 2016-10-20 DIAGNOSIS — R197 Diarrhea, unspecified: Secondary | ICD-10-CM

## 2016-10-20 DIAGNOSIS — F17200 Nicotine dependence, unspecified, uncomplicated: Secondary | ICD-10-CM | POA: Diagnosis not present

## 2016-10-20 DIAGNOSIS — Z1231 Encounter for screening mammogram for malignant neoplasm of breast: Secondary | ICD-10-CM

## 2016-10-20 LAB — CBC WITH DIFFERENTIAL/PLATELET
Basophils Absolute: 0.1 10*3/uL (ref 0.0–0.1)
Basophils Relative: 1 %
EOS ABS: 0.2 10*3/uL (ref 0.0–0.7)
Eosinophils Relative: 5 %
HEMATOCRIT: 42 % (ref 36.0–46.0)
HEMOGLOBIN: 13.9 g/dL (ref 12.0–15.0)
Lymphocytes Relative: 24 %
Lymphs Abs: 1.2 10*3/uL (ref 0.7–4.0)
MCH: 33.4 pg (ref 26.0–34.0)
MCHC: 33.1 g/dL (ref 30.0–36.0)
MCV: 101 fL — ABNORMAL HIGH (ref 78.0–100.0)
Monocytes Absolute: 0.2 10*3/uL (ref 0.1–1.0)
Monocytes Relative: 4 %
NEUTROS ABS: 3.2 10*3/uL (ref 1.7–7.7)
NEUTROS PCT: 66 %
Platelets: 254 10*3/uL (ref 150–400)
RBC: 4.16 MIL/uL (ref 3.87–5.11)
RDW: 15.5 % (ref 11.5–15.5)
WBC: 4.8 10*3/uL (ref 4.0–10.5)

## 2016-10-20 LAB — IRON AND TIBC
IRON: 67 ug/dL (ref 28–170)
Saturation Ratios: 15 % (ref 10.4–31.8)
TIBC: 462 ug/dL — ABNORMAL HIGH (ref 250–450)
UIBC: 395 ug/dL

## 2016-10-20 LAB — FERRITIN: FERRITIN: 289 ng/mL (ref 11–307)

## 2016-10-20 MED ORDER — FOLIC ACID 1 MG PO TABS: 1.0000 mg | ORAL_TABLET | Freq: Every day | ORAL | 4 refills | Status: DC

## 2016-10-20 MED ORDER — DEXLANSOPRAZOLE 60 MG PO CPDR: DELAYED_RELEASE_CAPSULE | ORAL | 1 refills | Status: DC

## 2016-10-20 NOTE — Patient Instructions (Addendum)
[place] at [facility] Discharge Instructions  RECOMMENDATIONS MADE BY THE CONSULTANT AND ANY TEST RESULTS WILL BE SENT TO YOUR REFERRING PHYSICIAN.  You were seen today by Mike Craze NP. Mammogram in May 2018. Smoking Cessation. Call GI to see if they can evaluate for increased diarrhea. Talk to Primary Care about sleep study. Labs in 3 months. Return in 6 months for labs and follow up.  Thank you for choosing [place] at [facility] to provide your oncology and hematology care.  To afford each patient quality time with our provider, please arrive at least 15 minutes before your scheduled appointment time.    If you have a lab appointment with the [facility] please come in thru the  Main Entrance and check in at the main information desk  You need to re-schedule your appointment should you arrive 10 or more minutes late.  We strive to give you quality time with our providers, and arriving late affects you and other patients whose appointments are after yours.  Also, if you no show three or more times for appointments you may be dismissed from the clinic at the providers discretion.     Again, thank you for choosing [facility].  Our hope is that these requests will decrease the amount of time that you wait before being seen by our physicians.       _____________________________________________________________  Should you have questions after your visit to [facility], please contact our office at [phone] between the hours of 8:30 a.m. and 4:30 p.m.  Voicemails left after 4:30 p.m. will not be returned until the following business day.  For prescription refill requests, have your pharmacy contact our office.       Resources For Cancer Patients and their Caregivers ? American Cancer Society: Can assist with transportation, wigs, general needs, runs Look Good Feel Better.         340-535-8166 ? Cancer Care: Provides financial assistance, online support groups, medication/co-pay assistance.  1-800-813-HOPE 442-817-2052) ? [place] Assists [place] cancer patients and their families through emotional , educational and financial support.  (442)288-1451 ? [place] DSS Where to apply for food stamps, Medicaid and utility assistance. 434 129 9688 ? RCATS: Transportation to medical appointments. 575-444-6050 ? Social Security Administration: May apply for disability if have a Stage IV cancer. [phone] 973-416-4860 ? [employer], Disability and Transit Services: Assists with nutrition, care and transit needs. [place] Support Programs: '@10RELATIVEDAYS'$ @ > Cancer Support Group  2nd Tuesday of the month 1pm-2pm, Journey Room  > Creative Journey  3rd Tuesday of the month 1130am-1pm, Journey Room  > Look Good Feel Better  1st Wednesday of the month 10am-12 noon, Journey Room (Call [place] to register [phone])

## 2016-10-20 NOTE — Progress Notes (Signed)
[name], [place] [address]   CLINIC:  Medical Oncology/Hematology  PCP:  [name], MD [place] [address] [phone]   REASON FOR VISIT:  Follow-up for Iron deficiency anemia AND folate deficiency   CURRENT THERAPY: IV iron as needed AND 1 mg po folic acid daily    HISTORY OF PRESENT ILLNESS:  (From Kirby Crigler, PA-C's last note on 04/21/16)      INTERVAL HISTORY:  [name] 56 y.o. female presents for routine follow-up for iron deficiency anemia.   Overall, she reports feeling pretty well. Endorses feeling very tired all of the time. States that with her last IV iron infusion in 08/2016, her energy improved a bit for a short time.  She tolerates IV iron well without issues. Denies any frank bleeding in her stools, urine, or vaginal bleeding.  States that she does bruise/bleed easily while she remains on daily aspirin.    She does not sleep well; reports that she only sleeps for about 4 hours each night; she takes naps during the day after work most days.  Reports shortness of breath, sometimes at rest and other times with exertion; this has been going on for over 1 year. Endorses occasional abdominal pain and nausea; she also has diarrhea every day, about 5 stools per day. "This has been my normal for about 1 year."  Her last colonoscopy/EGD was in 02/2014 with Dr. Gala Romney; she does not follow-up regularly with GI.  She sees Dr. Joya Gaskins at [facility] for esophageal dilations as needed.  Dr. Nevada Crane is her PCP; she is due to see him sometime in June/July per her report.    Her mammogram is due sometime soon. She received her flu shot with her PCP. She continues to smoke; about 1 ppd. She was trying to cut back on how much she smokes per day and had gotten down to 1/2 ppd, but family stress caused her to increase back to 1 ppd.  She has tried Chantix in the past, which helped her stop smoking for about 2  weeks, but she states she had "really crazy and bad thoughts on that medicine."     IV Iron Administration Record:  Oncology Flowsheet 06/04/2014  ferric gluconate (NULECIT) IV --  ferumoxytol [facility]) IV 510 mg   Oncology Flowsheet 06/11/2014 07/10/2015 02/02/2016 05/03/2016  ferric gluconate (NULECIT) IV -- 125 mg -- --  ferumoxytol [facility]) IV 510 mg -- 510 mg 510 mg   Oncology Flowsheet 09/02/2016  ferric gluconate (NULECIT) IV --  ferumoxytol (FERAHEME) IV 510 mg     REVIEW OF SYSTEMS:  Review of Systems  Constitutional: Positive for fatigue. Negative for chills and fever.  HENT:  Negative.   Eyes: Negative.   Respiratory: Positive for shortness of breath.   Cardiovascular: Negative.   Gastrointestinal: Positive for abdominal pain, diarrhea and nausea. Negative for blood in stool and vomiting.  Endocrine: Negative.   Genitourinary: Negative.  Negative for dysuria, hematuria and vaginal bleeding.   Skin: Negative.  Negative for rash.  Neurological: Negative.   Hematological: Bruises/bleeds easily.  Psychiatric/Behavioral: Positive for sleep disturbance.     PAST MEDICAL/SURGICAL HISTORY:  Past Medical History:  Diagnosis Date  . Anxiety   . Arthritis    "hands; spine too" (04/04/2012)  . Exertional dyspnea   . Folate deficiency 10/17/2015  . GERD (gastroesophageal reflux disease)   . Hyperlipemia   . Hypertension   . Macrocytosis  10/17/2015  . Neck pain, chronic   . PAD (peripheral artery disease) ([place])   . Renal artery stenosis ([place])   . Seasonal allergies   . Tobacco abuse    Past Surgical History:  Procedure Laterality Date  . ABDOMINAL ANGIOGRAM  04/04/2012   Procedure: ABDOMINAL ANGIOGRAM;  Surgeon: Lorretta Harp, MD;  Location: [facility] CATH LAB;  Service: Cardiovascular;;  . ANTERIOR CERVICAL DECOMP/DISCECTOMY FUSION  ~ 04/2006   C6, 7, T1  . COLONOSCOPY N/A 02/26/2014   Adequate preparation. Minimal anal canal hemorrhoids;otherwise, normal  rectumdiminutive polyps in the middescending segment; otherwise, the remainder of colonic mucosaappeared normal. Hyperplastic polyps.  . ESOPHAGOGASTRODUODENOSCOPY N/A 02/26/2014   IDP:OEUMPNTI distal esophagus, query short segment Barrett's. Status post Venia Minks dilation with esophageal biopsy. Abnormal gastric mucosa. Esophageal biopsy negative for Barrett's. Gastric biopsy showed mild chronic gastritis, no H pylori.  Marland Kitchen LAPAROSCOPIC APPENDECTOMY N/A 09/10/2013   Procedure: APPENDECTOMY LAPAROSCOPIC;  Surgeon: Jamesetta So, MD;  Location: [facility] ORS;  Service: General;  Laterality: N/A;  . LOWER EXTREMITY ANGIOGRAM N/A 04/04/2012   Procedure: LOWER EXTREMITY ANGIOGRAM;  Surgeon: Lorretta Harp, MD;  Location: [facility] CATH LAB;  Service: Cardiovascular;  Laterality: N/A;  . Venia Minks DILATION N/A 02/26/2014   Procedure: Venia Minks DILATION;  Surgeon: Daneil Dolin, MD;  Location: [facility] ENDO SUITE;  Service: Endoscopy;  Laterality: N/A;  . PERIPHERAL ARTERIAL STENT GRAFT  04/04/2012  . RENAL ANGIOGRAM Left 04/22/2014   Procedure: RENAL ANGIOGRAM;  Surgeon: Lorretta Harp, MD;  Location: [facility] CATH LAB;  Service: Cardiovascular;  Laterality: Left;  . TUBAL LIGATION  1990     SOCIAL HISTORY:  Social History   Social History  . Marital status: Married    Spouse name: N/A  . Number of children: N/A  . Years of education: N/A   Occupational History  . [place]    [address]   Social History Main Topics  . Smoking status: Current Every Day Smoker    Packs/day: 0.75    Years: 36.00    Types: Cigarettes  . Smokeless tobacco: Never Used  . Alcohol use No  . Drug use: No  . Sexual activity: No   Other Topics Concern  . Not on file   Social History Narrative  . No narrative on file    FAMILY HISTORY:  Family History  Problem Relation Age of Onset  . Leukemia Father   . Cancer Father   . Heart disease Father   . Heart disease Mother   . Heart disease Other   . Arthritis     . Asthma    . Colon cancer      unknown, possibly father    CURRENT MEDICATIONS:  Outpatient Encounter Prescriptions as of 10/20/2016  Medication Sig Note  . aspirin EC 81 MG tablet Take 81 mg by mouth daily.   Marland Kitchen atorvastatin (LIPITOR) 20 MG tablet Take 20 mg by mouth daily.   . clopidogrel (PLAVIX) 75 MG tablet Take 1 tablet (75 mg total) by mouth daily.   . cyclobenzaprine (FLEXERIL) 10 MG tablet Take 5-10 mg by mouth 3 (three) times daily as needed for muscle spasms.    Marland Kitchen dexlansoprazole (DEXILANT) 60 MG capsule TAKE (1) CAPSULE BY MOUTH ONCE DAILY.   . diphenhydrAMINE (BENADRYL) 25 MG tablet Take 25 mg by mouth every 6 (six) hours as needed.   Marland Kitchen escitalopram (LEXAPRO) 10 MG tablet Take 10 mg by mouth Daily.   . fenofibrate 160  MG tablet  04/21/2016: Received from: External Pharmacy  . folic acid (FOLVITE) 1 MG tablet Take 1 tablet (1 mg total) by mouth daily.   . metoprolol tartrate (LOPRESSOR) 25 MG tablet Take 0.5 tablets (12.5 mg total) by mouth 2 (two) times daily as needed.   . ranitidine (ZANTAC) 150 MG tablet Take 150 mg by mouth at bedtime.   . traMADol (ULTRAM) 50 MG tablet Take 50 mg by mouth every 6 (six) hours as needed.  10/01/2014: Received from: External Pharmacy  . [DISCONTINUED] dexlansoprazole (DEXILANT) 60 MG capsule TAKE (1) CAPSULE BY MOUTH ONCE DAILY.   . [DISCONTINUED] folic acid (FOLVITE) 1 MG tablet Take 1 tablet (1 mg total) by mouth daily.    No facility-administered encounter medications on file as of 10/20/2016.     ALLERGIES:  Allergies  Allergen Reactions  . Latex Rash    Blisters, rash  . Codeine Nausea And Vomiting     PHYSICAL EXAM:  ECOG Performance status: 1 - Symptomatic, but independent   Vitals:   10/20/16 1048  BP: 111/81  Pulse: 77  Resp: 16  Temp: 97.7 F (36.5 C)   Filed Weights   10/20/16 1048  Weight: 144 lb 14.4 oz (65.7 kg)    Physical Exam  Constitutional: She is oriented to person, place, and time and  well-developed, well-nourished, and in no distress.  HENT:  Head: Normocephalic.  Mouth/Throat: Oropharynx is clear and moist. No oropharyngeal exudate.  Eyes: Conjunctivae are normal. Pupils are equal, round, and reactive to light. No scleral icterus.  Neck: Normal range of motion. Neck supple.  Cardiovascular: Normal rate, regular rhythm and normal heart sounds.   Pulmonary/Chest: Effort normal and breath sounds normal. No respiratory distress.  Abdominal: Soft. Bowel sounds are normal. There is no tenderness.  Musculoskeletal: Normal range of motion. She exhibits no edema.  Lymphadenopathy:    She has no cervical adenopathy.       Right: No supraclavicular adenopathy present.       Left: No supraclavicular adenopathy present.  Neurological: She is alert and oriented to person, place, and time. No cranial nerve deficit. Gait normal.  Skin: Skin is warm and dry. No rash noted.  Psychiatric: Mood, memory, affect and judgment normal.  Nursing note and vitals reviewed.    LABORATORY DATA:  I have reviewed the labs as listed.  CBC    Component Value Date/Time   WBC 4.8 10/20/2016 1000   RBC 4.16 10/20/2016 1000   HGB 13.9 10/20/2016 1000   HCT 42.0 10/20/2016 1000   PLT 254 10/20/2016 1000   MCV 101.0 (H) 10/20/2016 1000   MCH 33.4 10/20/2016 1000   MCHC 33.1 10/20/2016 1000   RDW 15.5 10/20/2016 1000   LYMPHSABS 1.2 10/20/2016 1000   MONOABS 0.2 10/20/2016 1000   EOSABS 0.2 10/20/2016 1000   BASOSABS 0.1 10/20/2016 1000   CMP Latest Ref Rng & Units 08/17/2016 04/21/2016 05/30/2014  Glucose 65 - 99 mg/dL 116(H) 107(H) 101(H)  BUN 6 - 20 mg/dL '20 16 17  '$ Creatinine 0.44 - 1.00 mg/dL 1.36(H) 1.31(H) 1.27(H)  Sodium 135 - 145 mmol/L 140 138 142  Potassium 3.5 - 5.1 mmol/L 3.5 3.8 3.8  Chloride 101 - 111 mmol/L 106 106 105  CO2 22 - 32 mmol/L '26 24 24  '$ Calcium 8.9 - 10.3 mg/dL 9.1 9.3 9.4  Total Protein 6.5 - 8.1 g/dL 6.4(L) - 7.1  Total Bilirubin 0.3 - 1.2 mg/dL 0.2(L) -  0.2(L)  Alkaline Phos 38 -  126 U/L 46 - 110  AST 15 - 41 U/L 15 - 12  ALT 14 - 54 U/L 15 - 9    PENDING LABS:  Ferritin, iron/TIBC pending   DIAGNOSTIC IMAGING:  Last colonoscopy/EGD: 02/26/14 (Dr. Gala Romney)     PATHOLOGY:  Last colonoscopy/EGD: 02/26/14 (Dr. Gala Romney)      ASSESSMENT & PLAN:   Iron deficiency anemia:  -Hemoglobin normal today.  -Last IV iron infusion was 09/02/16 -Ferritin, iron/TIBC pending today. We will make arrangements for IV iron, if appropriate based on lab values.   Persistent fatigue:  -While anemia can certainly contribute to persistent fatigue, her hemoglobin is normal and has been for quite some time.  Awaiting iron lab studies for evaluation of iron deficiency that could be contributing to fatigue.  -Does report that she snores; she is unsure if she has periods of apnea. She does not sleep well at night (only about 4 hours of uninterrupted sleep each night); also takes naps during the day.  Recommended she talk with her PCP, Dr. Nevada Crane, for consideration for sleep study to evaluation for sleep apnea.    Folate deficiency:  -Macrocytosis noted today; she states that she ran out of her folic acid prescription recently. Folic acid refilled today; we will continue to monitor.   Diarrhea/Abdominal pain/Nausea:  -Persistent diarrhea (5+ stools/day) for about 1 year. She has associated nausea and occasional abdominal pain. Denies any blood in her stools.  -Recommended she contact her GI specialist/PCP to discuss possibility of needing short-interval colonoscopy for further work-up.    GERD:  -Well-controlled with Dexilant.  Certainly PPI therapy can contribute to her diarrhea, abdominal pain, and occasional nausea. Discussed that we could try a different PPI to see if she tolerates it better, but she states that her reflux symptoms have been so much better and she is able to eat better, that she prefers to stay on the [place]. Refills e-scribed today.     Smoking cessation:  -We discussed the pathophysiology of nicotine dependence and different strategies to stop smoking.  I shared with her that there is limited data to suggest that e-cigarettes help in smoking cessation efforts; we do know that many of the flavorings in e-cigarettes have known carcinogens.  The gold standard of tobacco cessation is nicotine replacement (with nicotine patches & gum/lozenges) or Varenicline (Chantix).  She has tried Chantix in the past and "I had really crazy thoughts and was in a deep depression. I do not want to take that medicine ever again."  We discussed that the typical craving/urge to smoke lasts about 3-5 minutes; her biggest trigger(s) to use cigarettes is smoking inside her home.  Encouraged her to set some new "rules" that do not allow her to smoke in the house; instead she could use a piece of nicotine gum or a lozenge when she has a craving. I gave her instructions on how to use these nicotine replacement products.   Ms.Earlywine  understands that data suggests that "cold [place]" is the least effective way to stop using tobacco products.  Having a clear "quit plan" is much more effective and requires a step-wise approach with continued support from a tobacco treatment specialist.  I encouraged her to reach out to me if she has further questions or interest in her continued cessation efforts.  Greater than 10 minutes was spent in smoking cessation counseling with this patient.    -Gave her phone number to [facility] (1-800-QUIT-NOW), where she may have access to free nicotine  patches/lozenges/gum.   -Recommended she start with 21 mg nicotine patch/day; rotate patch to different areas of arm/upper back to prevent skin irritation.  -For urges/cravings: Take 1 piece of nicotine gum 4 mg or 4 mg lozenge as needed; Max 8 pieces of gum/lozenges per day.     Lung cancer screening:  -Discussed that she will be eligible at age 53 given her 38+ year pack year smoking  history. Briefly discussed the low-dose CT chest for lung cancer screening today; will discuss again at subsequent visits when she becomes eligible.   Health maintenance/Wellness promotion:  -Annual screening mammogram due in 11/2016; orders placed today. Up-to-date with vaccinations per her report.  Last colonoscopy was in 02/2014.  -Recommended healthy diet and regular exercise.  -Discussed smoking cessation (as stated above).       Dispo:  -Labs only in 3 months. (CBC with diff, ferritin, iron/TIBC, folate, vitamin B12)  -Return to cancer center in 6 months for follow-up visit and lab. (CBC with diff, BMP, ferritin, iron/TIBC, folate, vitamin B12).    All questions were answered to patient's stated satisfaction. Encouraged patient to call with any new concerns or questions before her next visit to the cancer center and we can certain see her sooner, if needed.    Plan of care discussed with Dr. Talbert Cage, who agrees with the above aforementioned.     Orders placed this encounter:  Orders Placed This Encounter  Procedures  . MM SCREENING BREAST TOMO BILATERAL  . Vitamin B12  . Folate  . Iron and TIBC  . Ferritin  . CBC with Differential/Platelet  . Basic metabolic panel      Mike Craze, NP [place] [phone]

## 2016-11-17 ENCOUNTER — Ambulatory Visit (HOSPITAL_COMMUNITY): Payer: Commercial Managed Care - PPO

## 2016-11-19 ENCOUNTER — Ambulatory Visit (HOSPITAL_COMMUNITY)
Admission: RE | Admit: 2016-11-19 | Discharge: 2016-11-19 | Disposition: A | Discharge status: Home / Self Care | Payer: Commercial Managed Care - PPO | Source: Ambulatory Visit | Attending: Adult Health | Admitting: Adult Health

## 2016-11-19 DIAGNOSIS — Z1231 Encounter for screening mammogram for malignant neoplasm of breast: Secondary | ICD-10-CM | POA: Insufficient documentation

## 2017-01-21 ENCOUNTER — Encounter (HOSPITAL_COMMUNITY): Payer: Commercial Managed Care - PPO | Attending: Oncology

## 2017-01-21 DIAGNOSIS — E538 Deficiency of other specified B group vitamins: Secondary | ICD-10-CM | POA: Diagnosis present

## 2017-01-21 DIAGNOSIS — E611 Iron deficiency: Secondary | ICD-10-CM | POA: Diagnosis present

## 2017-01-21 LAB — CBC WITH DIFFERENTIAL/PLATELET
BASOS ABS: 0 10*3/uL (ref 0.0–0.1)
Basophils Relative: 1 %
Eosinophils Absolute: 0.2 10*3/uL (ref 0.0–0.7)
Eosinophils Relative: 3 %
HEMATOCRIT: 40.2 % (ref 36.0–46.0)
HEMOGLOBIN: 13.1 g/dL (ref 12.0–15.0)
LYMPHS PCT: 21 %
Lymphs Abs: 1.2 10*3/uL (ref 0.7–4.0)
MCH: 33.3 pg (ref 26.0–34.0)
MCHC: 32.6 g/dL (ref 30.0–36.0)
MCV: 102.3 fL — AB (ref 78.0–100.0)
MONOS PCT: 7 %
Monocytes Absolute: 0.4 10*3/uL (ref 0.1–1.0)
NEUTROS ABS: 3.8 10*3/uL (ref 1.7–7.7)
NEUTROS PCT: 68 %
Platelets: 245 10*3/uL (ref 150–400)
RBC: 3.93 MIL/uL (ref 3.87–5.11)
RDW: 14.7 % (ref 11.5–15.5)
WBC: 5.5 10*3/uL (ref 4.0–10.5)

## 2017-01-21 LAB — IRON AND TIBC
Iron: 102 ug/dL (ref 28–170)
Saturation Ratios: 21 % (ref 10.4–31.8)
TIBC: 496 ug/dL — ABNORMAL HIGH (ref 250–450)
UIBC: 394 ug/dL

## 2017-01-21 LAB — FERRITIN: FERRITIN: 143 ng/mL (ref 11–307)

## 2017-01-21 LAB — VITAMIN B12: Vitamin B-12: 252 pg/mL (ref 180–914)

## 2017-01-21 LAB — FOLATE: FOLATE: 62.1 ng/mL (ref >5.9)

## 2017-02-07 ENCOUNTER — Other Ambulatory Visit: Payer: Self-pay | Admitting: Cardiovascular Disease

## 2017-02-07 NOTE — Telephone Encounter (Signed)
REFILL 

## 2017-03-25 ENCOUNTER — Other Ambulatory Visit: Payer: Self-pay | Admitting: Cardiovascular Disease

## 2017-03-25 DIAGNOSIS — I739 Peripheral vascular disease, unspecified: Secondary | ICD-10-CM

## 2017-04-01 ENCOUNTER — Ambulatory Visit: Payer: Commercial Managed Care - PPO | Admitting: Cardiovascular Disease

## 2017-04-13 ENCOUNTER — Ambulatory Visit (HOSPITAL_COMMUNITY)
Admission: RE | Admit: 2017-04-13 | Discharge: 2017-04-13 | Disposition: A | Discharge status: Home / Self Care | Payer: Commercial Managed Care - PPO | Source: Ambulatory Visit | Attending: Cardiology | Admitting: Cardiology

## 2017-04-13 DIAGNOSIS — E785 Hyperlipidemia, unspecified: Secondary | ICD-10-CM | POA: Insufficient documentation

## 2017-04-13 DIAGNOSIS — Z720 Tobacco use: Secondary | ICD-10-CM | POA: Insufficient documentation

## 2017-04-13 DIAGNOSIS — I701 Atherosclerosis of renal artery: Secondary | ICD-10-CM

## 2017-04-13 DIAGNOSIS — I739 Peripheral vascular disease, unspecified: Secondary | ICD-10-CM | POA: Insufficient documentation

## 2017-04-13 DIAGNOSIS — Z87891 Personal history of nicotine dependence: Secondary | ICD-10-CM | POA: Insufficient documentation

## 2017-04-13 DIAGNOSIS — I10 Essential (primary) hypertension: Secondary | ICD-10-CM | POA: Diagnosis not present

## 2017-04-15 ENCOUNTER — Encounter: Payer: Self-pay | Admitting: Cardiovascular Disease

## 2017-04-15 ENCOUNTER — Ambulatory Visit (INDEPENDENT_AMBULATORY_CARE_PROVIDER_SITE_OTHER): Payer: Commercial Managed Care - PPO | Admitting: Cardiovascular Disease

## 2017-04-15 VITALS — BP 124/76 | HR 72 | Ht 65.0 in | Wt 148.0 lb

## 2017-04-15 DIAGNOSIS — Z720 Tobacco use: Secondary | ICD-10-CM | POA: Diagnosis not present

## 2017-04-15 DIAGNOSIS — I739 Peripheral vascular disease, unspecified: Secondary | ICD-10-CM | POA: Diagnosis not present

## 2017-04-15 DIAGNOSIS — I10 Essential (primary) hypertension: Secondary | ICD-10-CM | POA: Diagnosis not present

## 2017-04-15 DIAGNOSIS — E7800 Pure hypercholesterolemia, unspecified: Secondary | ICD-10-CM

## 2017-04-15 MED ORDER — CLOPIDOGREL BISULFATE 75 MG PO TABS: 75.0000 mg | ORAL_TABLET | Freq: Every day | ORAL | 3 refills | Status: DC

## 2017-04-15 NOTE — Progress Notes (Signed)
04/15/2017 Connie Farrell   12/26/1960  [id number]  Primary Physician Connie Squibb, MD Primary Cardiologist: Connie Harp MD Connie Farrell, [place]  HPI:  Connie Farrell is a 56 y.o. female , thin appearing, married, Caucasian female, mother of two, grandmother to one grandchild, who was initially referred to me by Connie Farrell for peripheral vascular evaluation. I last saw her in the office 03/26/16. She currently sees doctors on call. .  Her cardiac risk factor profile is positive for a 30 pack year of tobacco abuse, currently smoking 0.75 packs per day. Recalcitrant to risk factor modification. I did counsel her for greater than three minutes. Her other problems include hypertension and hyperlipidemia, as well as a strong family history for heart disease. She had a negative Myoview performed by Dr. Nehemiah Farrell at [facility]. 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 I performed an angiogram on her April 04, 2012, confirming this with a 40-mm pull-back gradient. I stented her left common iliac artery with result in improvement in her left ABI from 0.82 to 0.96. Improvement in her Dopplers and claudication symptoms as well. We have been following her renal Dopplers closely which is usually has shown a significant progression of disease with decline in her pole to pole dimension the dimension from 10.7-9.2 cm an increase in her serum creatinine from 1.2-2.0. Her primary care physician subsequently discontinued her ACE inhibitor.I performed angiography on her 04/22/14 revealing a 60% left renal artery stenosis with a 20 mm Hg gradient. . She also had a widely patent left iliac stent. I decided to treat this medically. She denies chest pain or shortness of breath. Since I saw her year ago she developed progressive left lower extremity  recurrent claudication and new onset palpitations. Since I saw her year ago her claudication has somewhat progressed. Her left iliac velocities have increased as well.    Current Meds  Medication Sig  . aspirin EC 81 MG tablet Take 81 mg by mouth daily.  Marland Kitchen atorvastatin (LIPITOR) 20 MG tablet Take 20 mg by mouth daily.  . cyclobenzaprine (FLEXERIL) 10 MG tablet Take 5-10 mg by mouth 3 (three) times daily as needed for muscle spasms.   Marland Kitchen dexlansoprazole (DEXILANT) 60 MG capsule TAKE (1) CAPSULE BY MOUTH ONCE DAILY.  Marland Kitchen escitalopram (LEXAPRO) 10 MG tablet Take 10 mg by mouth Daily.  . fenofibrate 160 MG tablet   . folic acid (FOLVITE) 1 MG tablet Take 1 tablet (1 mg total) by mouth daily.  . metoprolol tartrate (LOPRESSOR) 25 MG tablet Take 0.5 tablets (12.5 mg total) by mouth 2 (two) times daily as needed.  . ranitidine (ZANTAC) 150 MG tablet Take 150 mg by mouth at bedtime.  . traMADol (ULTRAM) 50 MG tablet Take 50 mg by mouth every 6 (six) hours as needed.   . [DISCONTINUED] clopidogrel (PLAVIX) 75 MG tablet TAKE 1 TABLET DAILY     Allergies  Allergen Reactions  . Latex Rash    Blisters, rash  . Codeine Nausea And Vomiting    Social History   Social History  . Marital status: Married    Spouse name: N/A  . Number of children: N/A  . Years of education: N/A   Occupational History  . [place] [employer]    [address]   Social  History Main Topics  . Smoking status: Current Every Day Smoker    Packs/day: 0.75    Years: 36.00    Types: Cigarettes  . Smokeless tobacco: Never Used  . Alcohol use No  . Drug use: No  . Sexual activity: No   Other Topics Concern  . Not on file   Social History Narrative  . No narrative on file     Review of Systems: General: negative for chills, fever, night sweats or weight changes.  Cardiovascular: negative for chest pain, dyspnea on exertion, edema, orthopnea, palpitations, paroxysmal nocturnal dyspnea or shortness of  breath Dermatological: negative for rash Respiratory: negative for cough or wheezing Urologic: negative for hematuria Abdominal: negative for nausea, vomiting, diarrhea, bright red blood per rectum, melena, or hematemesis Neurologic: negative for visual changes, syncope, or dizziness All other systems reviewed and are otherwise negative except as noted above.    Blood pressure 124/76, pulse 72, height 5\' 5"  (1.651 m), weight 148 lb (67.1 kg).  General appearance: alert and no distress Neck: no adenopathy, no carotid bruit, no JVD, supple, symmetrical, trachea midline and thyroid not enlarged, symmetric, no tenderness/mass/nodules Lungs: clear to auscultation bilaterally Heart: regular rate and rhythm, S1, S2 normal, no murmur, click, rub or gallop Abdomen: soft, non-tender; bowel sounds normal; no masses,  no organomegaly Pulses: 2+ and symmetric Skin: Skin color, texture, turgor normal. No rashes or lesions Neurologic: Alert and oriented X 3, normal strength and tone. Normal symmetric reflexes. Normal coordination and gait  EKG sinus rhythm at 72 without ST or T-wave changes. I personally reviewed this EKG  ASSESSMENT AND PLAN:   PAD (peripheral artery disease) History of PAD status post left common iliac artery stenting by myself 04/04/12 at which time it demonstrated a 40 mmHg pullback gradient across the left common iliac artery. Her Dopplers improved after stenting from left ABI 0.8->.96. She also had demonstrated renal artery stenosis which was only 60% by angiography which I performed 04/22/14. Over the last several months she's noticed increasing bilateral lower; claudication. Recent renal Dopplers showed stable left renal artery however the velocities in her left common iliac artery have increased from 338 cm/s to 424 cm/s. She does wish to wait prior to embarking on repeat angiography and potential intervention at this time.  HTN (hypertension) History of essential hypertension  blood pressure measured at 124/76. She is on Lopressor. Continue current meds at current dosing  Tobacco abuse History of continued tobacco abuse of one half pack per day down from one pack per day recalcitrant to factor modification.  HLD (hyperlipidemia) History of hyperlipidemia on statin therapy followed by her PCP      Connie Harp MD [facility], [facility] 04/15/2017 10:14 AM

## 2017-04-15 NOTE — Patient Instructions (Signed)
Medication Instructions: Your physician recommends that you continue on your current medications as directed. Please refer to the Current Medication list given to you today.  Labwork: I will request recent labs from Dr. Juel Burrow office.  Testing/Procedures: In approximately 1-2 weeks: Your physician has requested that you have a lower extremity arterial duplex. During this test, ultrasound is used to evaluate arterial blood flow in the legs. Allow one hour for this exam. There are no restrictions or special instructions.  Your physician has requested that you have an ankle brachial index (ABI). During this test an ultrasound and blood pressure cuff are used to evaluate the arteries that supply the arms and legs with blood. Allow thirty minutes for this exam. There are no restrictions or special instructions.  Follow-Up: Your physician wants you to follow-up in: 1 year with [name]. You will receive a reminder letter in the mail two months in advance. If you don't receive a letter, please call our office to schedule the follow-up appointment.  If you need a refill on your cardiac medications before your next appointment, please call your pharmacy.

## 2017-04-15 NOTE — Assessment & Plan Note (Signed)
History of PAD status post left common iliac artery stenting by myself 04/04/12 at which time it demonstrated a 40 mmHg pullback gradient across the left common iliac artery. Her Dopplers improved after stenting from left ABI 0.8->.96. She also had demonstrated renal artery stenosis which was only 60% by angiography which I performed 04/22/14. Over the last several months she's noticed increasing bilateral lower; claudication. Recent renal Dopplers showed stable left renal artery however the velocities in her left common iliac artery have increased from 338 cm/s to 424 cm/s. She does wish to wait prior to embarking on repeat angiography and potential intervention at this time.

## 2017-04-15 NOTE — Assessment & Plan Note (Signed)
History of continued tobacco abuse of one half pack per day down from one pack per day recalcitrant to factor modification.

## 2017-04-15 NOTE — Assessment & Plan Note (Signed)
History of hyperlipidemia on statin therapy followed by her PCP. 

## 2017-04-15 NOTE — Assessment & Plan Note (Signed)
History of essential hypertension blood pressure measured at 124/76. She is on Lopressor. Continue current meds at current dosing

## 2017-04-21 ENCOUNTER — Encounter (HOSPITAL_COMMUNITY): Payer: Commercial Managed Care - PPO | Attending: Oncology

## 2017-04-21 ENCOUNTER — Encounter (HOSPITAL_COMMUNITY): Payer: Self-pay

## 2017-04-21 ENCOUNTER — Encounter (HOSPITAL_COMMUNITY): Payer: Commercial Managed Care - PPO | Attending: Oncology | Admitting: Oncology

## 2017-04-21 VITALS — BP 131/77 | HR 83 | Temp 97.2°F | Resp 18 | Wt 145.5 lb

## 2017-04-21 DIAGNOSIS — E611 Iron deficiency: Secondary | ICD-10-CM | POA: Insufficient documentation

## 2017-04-21 DIAGNOSIS — D509 Iron deficiency anemia, unspecified: Secondary | ICD-10-CM

## 2017-04-21 DIAGNOSIS — E538 Deficiency of other specified B group vitamins: Secondary | ICD-10-CM | POA: Diagnosis present

## 2017-04-21 LAB — CBC WITH DIFFERENTIAL/PLATELET
BASOS ABS: 0.1 10*3/uL (ref 0.0–0.1)
Basophils Relative: 1 %
EOS PCT: 5 %
Eosinophils Absolute: 0.2 10*3/uL (ref 0.0–0.7)
HCT: 42.9 % (ref 36.0–46.0)
Hemoglobin: 14.1 g/dL (ref 12.0–15.0)
LYMPHS ABS: 1.3 10*3/uL (ref 0.7–4.0)
LYMPHS PCT: 26 %
MCH: 33.3 pg (ref 26.0–34.0)
MCHC: 32.9 g/dL (ref 30.0–36.0)
MCV: 101.4 fL — AB (ref 78.0–100.0)
MONO ABS: 0.2 10*3/uL (ref 0.1–1.0)
Monocytes Relative: 5 %
Neutro Abs: 3.1 10*3/uL (ref 1.7–7.7)
Neutrophils Relative %: 63 %
PLATELETS: 247 10*3/uL (ref 150–400)
RBC: 4.23 MIL/uL (ref 3.87–5.11)
RDW: 15.1 % (ref 11.5–15.5)
WBC: 4.9 10*3/uL (ref 4.0–10.5)

## 2017-04-21 LAB — BASIC METABOLIC PANEL
Anion gap: 10 (ref 5–15)
BUN: 22 mg/dL — AB (ref 6–20)
CALCIUM: 10 mg/dL (ref 8.9–10.3)
CO2: 23 mmol/L (ref 22–32)
CREATININE: 1.46 mg/dL — AB (ref 0.44–1.00)
Chloride: 105 mmol/L (ref 101–111)
GFR calc Af Amer: 46 mL/min — ABNORMAL LOW (ref >60)
GFR, EST NON AFRICAN AMERICAN: 40 mL/min — AB (ref >60)
Glucose, Bld: 95 mg/dL (ref 65–99)
Potassium: 4.8 mmol/L (ref 3.5–5.1)
Sodium: 138 mmol/L (ref 135–145)

## 2017-04-21 LAB — FOLATE: FOLATE: 22.3 ng/mL (ref >5.9)

## 2017-04-21 LAB — IRON AND TIBC
Iron: 65 ug/dL (ref 28–170)
Saturation Ratios: 12 % (ref 10.4–31.8)
TIBC: 524 ug/dL — ABNORMAL HIGH (ref 250–450)
UIBC: 459 ug/dL

## 2017-04-21 LAB — FERRITIN: Ferritin: 107 ng/mL (ref 11–307)

## 2017-04-21 LAB — VITAMIN B12: VITAMIN B 12: 175 pg/mL — AB (ref 180–914)

## 2017-04-21 NOTE — Patient Instructions (Signed)
[facility] at [facility] Discharge Instructions  RECOMMENDATIONS MADE BY THE CONSULTANT AND ANY TEST RESULTS WILL BE SENT TO YOUR REFERRING PHYSICIAN.  You saw Dr. Zhou today.  Thank you for choosing [facility] at [facility] to provide your oncology and hematology care.  To afford each patient quality time with our provider, please arrive at least 15 minutes before your scheduled appointment time.    If you have a lab appointment with the Cancer Center please come in thru the  Main Entrance and check in at the main information desk  You need to re-schedule your appointment should you arrive 10 or more minutes late.  We strive to give you quality time with our providers, and arriving late affects you and other patients whose appointments are after yours.  Also, if you no show three or more times for appointments you may be dismissed from the clinic at the providers discretion.     Again, thank you for choosing [facility].  Our hope is that these requests will decrease the amount of time that you wait before being seen by our physicians.       _____________________________________________________________  Should you have questions after your visit to [facility], please contact our office at [phone] between the hours of 8:30 a.m. and 4:30 p.m.  Voicemails left after 4:30 p.m. will not be returned until the following business day.  For prescription refill requests, have your pharmacy contact our office.       Resources For Cancer Patients and their Caregivers ? American Cancer Society: Can assist with transportation, wigs, general needs, runs Look Good Feel Better.        1-888-227-6333 ? Cancer Care: Provides financial assistance, online support groups, medication/co-pay assistance.  1-800-813-HOPE (4673) ? [facility] Assists [place] cancer patients and their families through  emotional , educational and financial support.  336-427-4357 ? [place] DSS Where to apply for food stamps, Medicaid and utility assistance. 336-342-1394 ? [facility]: Transportation to medical appointments. 336-347-2287 ? Social Security Administration: May apply for disability if have a Stage IV cancer. 336-342-7796 1-800-772-1213 ? [place] Aging, Disability and Transit Services: Assists with nutrition, care and transit needs. 336-349-2343  Cancer Center Support Programs: @[email or web address]@ > Cancer Support Group  2nd Tuesday of the month 1pm-2pm, Journey Room  > Creative Journey  3rd Tuesday of the month 1130am-1pm, Journey Room  > Look Good Feel Better  1st Wednesday of the month 10am-12 noon, Journey Room (Call American Cancer Society to register 1-800-395-5775)    

## 2017-04-21 NOTE — Progress Notes (Signed)
[address] [place], [place] [address]   CLINIC:  Medical Oncology/Hematology  PCP:  Celene Squibb, [place] [address] [phone]   REASON FOR VISIT:  Follow-up for Iron deficiency anemia AND folate deficiency   CURRENT THERAPY: IV iron as needed AND 1 mg po folic acid daily    HISTORY OF PRESENT ILLNESS:  (From Kirby Crigler, PA-C's last note on 04/21/16)      INTERVAL HISTORY:  Connie Farrell 56 y.o. female presents for routine follow-up for iron deficiency anemia.   Patient states that she is doing well. She states that she has occasional fatigue especially when she's had a hard day at work. She denies any evidence of GI bleeding including melena, hematochezia, hematuria. She continues take her folate acid daily. She continues smoke about half a pack per day and has occasional shortness of breath since she is a chronic smoker. Otherwise she denies any chest pain, abdominal pain, nausea, vomiting, diarrhea, recent infections, focal weakness. Her hemoglobin is 14.1 g/dL today.  IV Iron Administration Record:  Oncology Flowsheet 06/04/2014  ferric gluconate (NULECIT) IV --  ferumoxytol [facility]) IV 510 mg   Oncology Flowsheet 06/11/2014 07/10/2015 02/02/2016 05/03/2016  ferric gluconate (NULECIT) IV -- 125 mg -- --  ferumoxytol [facility]) IV 510 mg -- 510 mg 510 mg   Oncology Flowsheet 09/02/2016  ferric gluconate (NULECIT) IV --  ferumoxytol (FERAHEME) IV 510 mg     REVIEW OF SYSTEMS:  Review of Systems  Constitutional: Positive for fatigue. Negative for chills and fever.  HENT:  Negative.   Eyes: Negative.   Respiratory: Negative for shortness of breath.   Cardiovascular: Negative.   Gastrointestinal: Negative for abdominal pain, blood in stool, diarrhea, nausea and vomiting.  Endocrine: Negative.   Genitourinary: Negative.  Negative for dysuria, hematuria and vaginal bleeding.   Skin: Negative.  Negative for  rash.  Neurological: Negative.   Hematological: Does not bruise/bleed easily.  Psychiatric/Behavioral: Negative for sleep disturbance.     PAST MEDICAL/SURGICAL HISTORY:  Past Medical History:  Diagnosis Date  . Anxiety   . Arthritis    "hands; spine too" (04/04/2012)  . Exertional dyspnea   . Folate deficiency 10/17/2015  . GERD (gastroesophageal reflux disease)   . Hyperlipemia   . Hypertension   . Macrocytosis 10/17/2015  . Neck pain, chronic   . PAD (peripheral artery disease) ([place])   . Renal artery stenosis ([place])   . Seasonal allergies   . Tobacco abuse    Past Surgical History:  Procedure Laterality Date  . ABDOMINAL ANGIOGRAM  04/04/2012   Procedure: ABDOMINAL ANGIOGRAM;  Surgeon: Lorretta Harp, MD;  Location: [facility] CATH LAB;  Service: Cardiovascular;;  . ANTERIOR CERVICAL DECOMP/DISCECTOMY FUSION  ~ 04/2006   C6, 7, T1  . COLONOSCOPY N/A 02/26/2014   Adequate preparation. Minimal anal canal hemorrhoids;otherwise, normal rectumdiminutive polyps in the middescending segment; otherwise, the remainder of colonic mucosaappeared normal. Hyperplastic polyps.  . ESOPHAGOGASTRODUODENOSCOPY N/A 02/26/2014   [id number] distal esophagus, query short segment Barrett's. Status post Venia Minks dilation with esophageal biopsy. Abnormal gastric mucosa. Esophageal biopsy negative for Barrett's. Gastric biopsy showed mild chronic gastritis, no H pylori.  Marland Kitchen LAPAROSCOPIC APPENDECTOMY N/A 09/10/2013   Procedure: APPENDECTOMY LAPAROSCOPIC;  Surgeon: Jamesetta So, MD;  Location: [facility] ORS;  Service: General;  Laterality: N/A;  . LOWER EXTREMITY ANGIOGRAM N/A 04/04/2012   Procedure: LOWER EXTREMITY ANGIOGRAM;  Surgeon: Lorretta Harp, MD;  Location: [facility] CATH LAB;  Service: Cardiovascular;  Laterality: N/A;  . Venia Minks DILATION N/A 02/26/2014   Procedure: Venia Minks DILATION;  Surgeon: Daneil Dolin, MD;  Location: [facility] ENDO SUITE;  Service: Endoscopy;  Laterality: N/A;  . PERIPHERAL ARTERIAL STENT GRAFT   04/04/2012  . RENAL ANGIOGRAM Left 04/22/2014   Procedure: RENAL ANGIOGRAM;  Surgeon: Lorretta Harp, MD;  Location[facility] CATH LAB;  Service: Cardiovascular;  Laterality: Left;  . TUBAL LIGATION  1990     SOCIAL HISTORY:  Social History   Social History  . Marital status: Married    Spouse name: N/A  . Number of children: N/A  . Years of education: N/A   Occupational History  . [employer]    [address]   Social History Main Topics  . Smoking status: Current Every Day Smoker    Packs/day: 0.75    Years: 36.00    Types: Cigarettes  . Smokeless tobacco: Never Used  . Alcohol use No  . Drug use: No  . Sexual activity: No   Other Topics Concern  . Not on file   Social History Narrative  . No narrative on file    FAMILY HISTORY:  Family History  Problem Relation Age of Onset  . Leukemia Father   . Cancer Father   . Heart disease Father   . Heart disease Mother   . Heart disease Other   . Arthritis Unknown   . Asthma Unknown   . Colon cancer Unknown        unknown, possibly father    CURRENT MEDICATIONS:  Outpatient Encounter Prescriptions as of 04/21/2017  Medication Sig Note  . aspirin EC 81 MG tablet Take 81 mg by mouth daily.   Marland Kitchen atorvastatin (LIPITOR) 20 MG tablet Take 20 mg by mouth daily.   . clopidogrel (PLAVIX) 75 MG tablet Take 1 tablet (75 mg total) by mouth daily.   . cyclobenzaprine (FLEXERIL) 10 MG tablet Take 5-10 mg by mouth 3 (three) times daily as needed for muscle spasms.    Marland Kitchen dexlansoprazole (DEXILANT) 60 MG capsule TAKE (1) CAPSULE BY MOUTH ONCE DAILY.   Marland Kitchen escitalopram (LEXAPRO) 10 MG tablet Take 10 mg by mouth Daily.   . fenofibrate 160 MG tablet  04/21/2016: Received from: External Pharmacy  . folic acid (FOLVITE) 1 MG tablet Take 1 tablet (1 mg total) by mouth daily.   . metoprolol tartrate (LOPRESSOR) 25 MG tablet Take 0.5 tablets (12.5 mg total) by mouth 2 (two) times daily as needed.   . ranitidine (ZANTAC) 150 MG  tablet Take 150 mg by mouth at bedtime.   . traMADol (ULTRAM) 50 MG tablet Take 50 mg by mouth every 6 (six) hours as needed.  10/01/2014: Received from: External Pharmacy   No facility-administered encounter medications on file as of 04/21/2017.     ALLERGIES:  Allergies  Allergen Reactions  . Latex Rash    Blisters, rash  . Codeine Nausea And Vomiting     PHYSICAL EXAM:  ECOG Performance status: 1 - Symptomatic, but independent   Vitals:   04/21/17 1118  BP: 131/77  Pulse: 83  Resp: 18  Temp: (!) 97.2 F (36.2 C)  SpO2: 97%   Filed Weights   04/21/17 1118  Weight: 145 lb 8 oz (66 kg)    Physical Exam  Constitutional: She is oriented to person, place, and time and well-developed, well-nourished, and in no distress.  HENT:  Head: Normocephalic.  Mouth/Throat: Oropharynx is clear and moist.  No oropharyngeal exudate.  Eyes: Pupils are equal, round, and reactive to light. Conjunctivae are normal. No scleral icterus.  Neck: Normal range of motion. Neck supple.  Cardiovascular: Normal rate, regular rhythm and normal heart sounds.   Pulmonary/Chest: Effort normal and breath sounds normal. No respiratory distress.  Abdominal: Soft. Bowel sounds are normal. There is no tenderness.  Musculoskeletal: Normal range of motion. She exhibits no edema.  Lymphadenopathy:    She has no cervical adenopathy.       Right: No supraclavicular adenopathy present.       Left: No supraclavicular adenopathy present.  Neurological: She is alert and oriented to person, place, and time. No cranial nerve deficit. Gait normal.  Skin: Skin is warm and dry. No rash noted.  Psychiatric: Mood, memory, affect and judgment normal.  Nursing note and vitals reviewed.    LABORATORY DATA:  I have reviewed the labs as listed.  CBC    Component Value Date/Time   WBC 4.9 04/21/2017 1048   RBC 4.23 04/21/2017 1048   HGB 14.1 04/21/2017 1048   HCT 42.9 04/21/2017 1048   PLT 247 04/21/2017 1048   MCV  101.4 (H) 04/21/2017 1048   MCH 33.3 04/21/2017 1048   MCHC 32.9 04/21/2017 1048   RDW 15.1 04/21/2017 1048   LYMPHSABS 1.3 04/21/2017 1048   MONOABS 0.2 04/21/2017 1048   EOSABS 0.2 04/21/2017 1048   BASOSABS 0.1 04/21/2017 1048   CMP Latest Ref Rng & Units 04/21/2017 08/17/2016 04/21/2016  Glucose 65 - 99 mg/dL 95 116(H) 107(H)  BUN 6 - 20 mg/dL 22(H) 20 16  Creatinine 0.44 - 1.00 mg/dL 1.46(H) 1.36(H) 1.31(H)  Sodium 135 - 145 mmol/L 138 140 138  Potassium 3.5 - 5.1 mmol/L 4.8 3.5 3.8  Chloride 101 - 111 mmol/L 105 106 106  CO2 22 - 32 mmol/L 23 26 24   Calcium 8.9 - 10.3 mg/dL 10.0 9.1 9.3  Total Protein 6.5 - 8.1 g/dL - 6.4(L) -  Total Bilirubin 0.3 - 1.2 mg/dL - 0.2(L) -  Alkaline Phos 38 - 126 U/L - 46 -  AST 15 - 41 U/L - 15 -  ALT 14 - 54 U/L - 15 -    PENDING LABS:  Ferritin, iron/TIBC pending   DIAGNOSTIC IMAGING:  Last colonoscopy/EGD: 02/26/14 (Dr. Gala Romney)     PATHOLOGY:  Last colonoscopy/EGD: 02/26/14 (Dr. Gala Romney)      ASSESSMENT & PLAN:   Iron deficiency anemia:  -Hemoglobin normal today at 14.1 g/dL. Labs reviewed in detail with the patient today.  -Last IV iron infusion was 09/02/16 -Ferritin, iron/TIBC pending today. Unlikely that she will need iron replacement since her hemoglobin is so high.  -continue to monitor hemoglobin.  Folate deficiency:  -Macrocytosis noted today but improved compared to before. Continue folic acid. Folate level pending today.   Dispo:  -Return to cancer center in 6 months for follow-up visit and lab. (CBC with diff, BMP, ferritin, iron/TIBC, folate, vitamin B12).    All questions were answered to patient's stated satisfaction. Encouraged patient to call with any new concerns or questions before her next visit to the cancer center and we can certain see her sooner, if needed.    Twana First, MD

## 2017-04-22 ENCOUNTER — Other Ambulatory Visit (HOSPITAL_COMMUNITY): Payer: Self-pay | Admitting: Oncology

## 2017-04-22 MED ORDER — VITAMIN B-12 1000 MCG PO TABS: 1000.0000 ug | ORAL_TABLET | Freq: Every day | ORAL | 2 refills | Status: DC

## 2017-04-22 NOTE — Progress Notes (Signed)
Please let pt know her b12 level is low so ive sent in an oral B12 for her to take.

## 2017-05-05 ENCOUNTER — Encounter (HOSPITAL_COMMUNITY): Payer: Self-pay

## 2017-05-05 ENCOUNTER — Ambulatory Visit (HOSPITAL_COMMUNITY)
Admission: RE | Admit: 2017-05-05 | Discharge: 2017-05-05 | Disposition: A | Discharge status: Home / Self Care | Payer: Commercial Managed Care - PPO | Source: Ambulatory Visit | Attending: Cardiology | Admitting: Cardiology

## 2017-05-05 DIAGNOSIS — I739 Peripheral vascular disease, unspecified: Secondary | ICD-10-CM

## 2017-06-02 ENCOUNTER — Other Ambulatory Visit (HOSPITAL_COMMUNITY): Payer: Self-pay

## 2017-06-02 DIAGNOSIS — K2100 Gastro-esophageal reflux disease with esophagitis, without bleeding: Secondary | ICD-10-CM

## 2017-06-02 MED ORDER — DEXLANSOPRAZOLE 60 MG PO CPDR: DELAYED_RELEASE_CAPSULE | ORAL | 1 refills | Status: DC

## 2017-06-02 NOTE — Telephone Encounter (Signed)
Received refill request from patients pharmacy for [place]. Reviewed with provider, chart checked and refilled.

## 2017-10-20 ENCOUNTER — Encounter (HOSPITAL_COMMUNITY): Payer: Self-pay | Admitting: Adult Health

## 2017-10-20 ENCOUNTER — Other Ambulatory Visit: Payer: Self-pay

## 2017-10-20 ENCOUNTER — Inpatient Hospital Stay (HOSPITAL_BASED_OUTPATIENT_CLINIC_OR_DEPARTMENT_OTHER): Payer: Commercial Managed Care - PPO | Admitting: Adult Health

## 2017-10-20 ENCOUNTER — Inpatient Hospital Stay (HOSPITAL_COMMUNITY): Payer: Commercial Managed Care - PPO | Attending: Internal Medicine

## 2017-10-20 VITALS — BP 156/82 | HR 62 | Temp 98.1°F | Resp 16 | Ht 64.0 in | Wt 151.0 lb

## 2017-10-20 DIAGNOSIS — R5382 Chronic fatigue, unspecified: Secondary | ICD-10-CM | POA: Diagnosis not present

## 2017-10-20 DIAGNOSIS — R232 Flushing: Secondary | ICD-10-CM

## 2017-10-20 DIAGNOSIS — E538 Deficiency of other specified B group vitamins: Secondary | ICD-10-CM

## 2017-10-20 DIAGNOSIS — D509 Iron deficiency anemia, unspecified: Secondary | ICD-10-CM

## 2017-10-20 DIAGNOSIS — I10 Essential (primary) hypertension: Secondary | ICD-10-CM

## 2017-10-20 DIAGNOSIS — F419 Anxiety disorder, unspecified: Secondary | ICD-10-CM

## 2017-10-20 DIAGNOSIS — K219 Gastro-esophageal reflux disease without esophagitis: Secondary | ICD-10-CM | POA: Diagnosis not present

## 2017-10-20 DIAGNOSIS — Z79899 Other long term (current) drug therapy: Secondary | ICD-10-CM

## 2017-10-20 DIAGNOSIS — Z9181 History of falling: Secondary | ICD-10-CM

## 2017-10-20 DIAGNOSIS — F17210 Nicotine dependence, cigarettes, uncomplicated: Secondary | ICD-10-CM | POA: Diagnosis not present

## 2017-10-20 DIAGNOSIS — Z806 Family history of leukemia: Secondary | ICD-10-CM

## 2017-10-20 DIAGNOSIS — E785 Hyperlipidemia, unspecified: Secondary | ICD-10-CM | POA: Diagnosis not present

## 2017-10-20 DIAGNOSIS — Z7902 Long term (current) use of antithrombotics/antiplatelets: Secondary | ICD-10-CM | POA: Insufficient documentation

## 2017-10-20 DIAGNOSIS — E611 Iron deficiency: Secondary | ICD-10-CM

## 2017-10-20 DIAGNOSIS — Z1231 Encounter for screening mammogram for malignant neoplasm of breast: Secondary | ICD-10-CM

## 2017-10-20 DIAGNOSIS — M1990 Unspecified osteoarthritis, unspecified site: Secondary | ICD-10-CM | POA: Diagnosis not present

## 2017-10-20 DIAGNOSIS — R61 Generalized hyperhidrosis: Secondary | ICD-10-CM | POA: Insufficient documentation

## 2017-10-20 DIAGNOSIS — Z7982 Long term (current) use of aspirin: Secondary | ICD-10-CM | POA: Diagnosis not present

## 2017-10-20 DIAGNOSIS — Z122 Encounter for screening for malignant neoplasm of respiratory organs: Secondary | ICD-10-CM

## 2017-10-20 LAB — CBC WITH DIFFERENTIAL/PLATELET
BASOS ABS: 0.1 10*3/uL (ref 0.0–0.1)
Basophils Relative: 1 %
EOS ABS: 0.2 10*3/uL (ref 0.0–0.7)
EOS PCT: 3 %
HCT: 45.3 % (ref 36.0–46.0)
HEMOGLOBIN: 14.5 g/dL (ref 12.0–15.0)
Lymphocytes Relative: 22 %
Lymphs Abs: 1.2 10*3/uL (ref 0.7–4.0)
MCH: 32.2 pg (ref 26.0–34.0)
MCHC: 32 g/dL (ref 30.0–36.0)
MCV: 100.7 fL — ABNORMAL HIGH (ref 78.0–100.0)
Monocytes Absolute: 0.3 10*3/uL (ref 0.1–1.0)
Monocytes Relative: 5 %
NEUTROS PCT: 69 %
Neutro Abs: 3.8 10*3/uL (ref 1.7–7.7)
PLATELETS: 239 10*3/uL (ref 150–400)
RBC: 4.5 MIL/uL (ref 3.87–5.11)
RDW: 14.3 % (ref 11.5–15.5)
WBC: 5.5 10*3/uL (ref 4.0–10.5)

## 2017-10-20 LAB — FERRITIN: FERRITIN: 78 ng/mL (ref 11–307)

## 2017-10-20 LAB — IRON AND TIBC
Iron: 115 ug/dL (ref 28–170)
SATURATION RATIOS: 19 % (ref 10.4–31.8)
TIBC: 591 ug/dL — AB (ref 250–450)
UIBC: 476 ug/dL

## 2017-10-20 LAB — FOLATE: Folate: >100 ng/mL (ref >5.9)

## 2017-10-20 LAB — VITAMIN B12: VITAMIN B 12: 203 pg/mL (ref 180–914)

## 2017-10-20 NOTE — Progress Notes (Addendum)
[place], [place] [address]   CLINIC:  Medical Oncology/Hematology  PCP:  Connie Squibb, MD [place] [id number] [phone]   REASON FOR VISIT:  Follow-up for Iron deficiency anemia AND folate deficiency AND vitamin B12 deficiency  CURRENT THERAPY: IV iron as needed AND 1 mg po folic acid daily AND vitamin B12 1000 mcg po daily    HISTORY OF PRESENT ILLNESS:  (From Kirby Crigler, PA-C's last note on 04/21/16)      INTERVAL HISTORY:  Connie Farrell 57 y.o. female presents for routine follow-up for iron deficiency anemia and folate deficiency.   Here today unaccompanied.   Overall, she tells me she has been feeling "pretty good."  Appetite 50%; energy levels 25%.  Denies any frank bleeding episodes including blood in her stools, dark/tarry stools, hematuria, vaginal bleeding, hemoptysis, nosebleeds, or gingival bleeding. Remains on folic acid daily as prescribed.   Does endorse easy bruising.  She reports a recent fall a few weeks ago, where she slipped on a pillowcase on her hardwood floors, and hit her arm on her bedside table.  She showed me a large bruise to her posterior right upper arm.  States "it was a big knot, but it is getting better."  Reports that she did not hit her head during this fall.  She continues to struggle with chronic hot flashes/sweats.  These are stable and largely unchanged.  She continues to smoke cigarettes, about 1 pack/day.  She started smoking at approximately age 70-17.  She has smoked about 1 pack/day for that entire ~10 years.  She has tried Chantix in the past, but was intolerant due to "bad thoughts and dreams."  Sees her PCP, Dr. Nevada Crane, as directed.  Otherwise, she is largely other complaints today.      REVIEW OF SYSTEMS:  Review of Systems  Constitutional: Positive for diaphoresis and fatigue. Negative for chills and fever.  HENT:  Negative.   Eyes: Negative.   Respiratory:  Negative.   Cardiovascular: Negative.   Gastrointestinal: Negative.   Endocrine: Positive for hot flashes.  Genitourinary: Negative.    Musculoskeletal: Negative.   Skin: Negative.   Neurological: Negative.   Hematological: Bruises/bleeds easily.     PAST MEDICAL/SURGICAL HISTORY:  Past Medical History:  Diagnosis Date  . Anxiety   . Arthritis    "hands; spine too" (04/04/2012)  . Exertional dyspnea   . Folate deficiency 10/17/2015  . GERD (gastroesophageal reflux disease)   . Hyperlipemia   . Hypertension   . Macrocytosis 10/17/2015  . Neck pain, chronic   . PAD (peripheral artery disease) ([place])   . Renal artery stenosis ([place])   . Seasonal allergies   . Tobacco abuse    Past Surgical History:  Procedure Laterality Date  . ABDOMINAL ANGIOGRAM  04/04/2012   Procedure: ABDOMINAL ANGIOGRAM;  Surgeon: Lorretta Harp, MD;  Location: [facility] CATH LAB;  Service: Cardiovascular;;  . ANTERIOR CERVICAL DECOMP/DISCECTOMY FUSION  ~ 04/2006   C6, 7, T1  . COLONOSCOPY N/A 02/26/2014   Adequate preparation. Minimal anal canal hemorrhoids;otherwise, normal rectumdiminutive polyps in the middescending segment; otherwise, the remainder of colonic mucosaappeared normal. Hyperplastic polyps.  . ESOPHAGOGASTRODUODENOSCOPY N/A 02/26/2014   GNO:[id number] distal esophagus, query short segment Barrett's. Status post Venia Minks dilation with esophageal biopsy. Abnormal gastric mucosa. Esophageal biopsy negative for Barrett's. Gastric biopsy showed mild chronic gastritis, no H pylori.  Marland Kitchen LAPAROSCOPIC APPENDECTOMY N/A 09/10/2013   Procedure: APPENDECTOMY LAPAROSCOPIC;  Surgeon: Jamesetta So, MD;  Location: [facility] ORS;  Service: General;  Laterality: N/A;  . LOWER EXTREMITY ANGIOGRAM N/A 04/04/2012   Procedure: LOWER EXTREMITY ANGIOGRAM;  Surgeon: Lorretta Harp, MD;  Location: [facility] CATH LAB;  Service: Cardiovascular;  Laterality: N/A;  . Venia Minks DILATION N/A 02/26/2014   Procedure: Venia Minks DILATION;  Surgeon: Daneil Dolin, MD;  Location: [facility] ENDO SUITE;  Service: Endoscopy;  Laterality: N/A;  . PERIPHERAL ARTERIAL STENT GRAFT  04/04/2012  . RENAL ANGIOGRAM Left 04/22/2014   Procedure: RENAL ANGIOGRAM;  Surgeon: Lorretta Harp, MD;  Location: [facility] CATH LAB;  Service: Cardiovascular;  Laterality: Left;  . TUBAL LIGATION  1990     SOCIAL HISTORY:  Social History   Socioeconomic History  . Marital status: Married    Spouse name: Not on file  . Number of children: Not on file  . Years of education: Not on file  . Highest education level: Not on file  Occupational History  . 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 Financial resource strain: Not on file  . Food insecurity:    Worry: Not on file    Inability: Not on file  . Transportation needs:    Medical: Not on file    Non-medical: Not on file  Tobacco Use  . Smoking status: Current Every Day Smoker    Packs/day: 0.75    Years: 36.00    Pack years: 27.00    Types: Cigarettes  . Smokeless tobacco: Never Used  Substance and Sexual Activity  . Alcohol use: No  . Drug use: No  . Sexual activity: Never  Lifestyle  . Physical activity:    Days per week: Not on file    Minutes per session: Not on file  . Stress: Not on file  Relationships  . Social connections:    Talks on phone: Not on file    Gets together: Not on file    Attends religious service: Not on file    Active member of club or organization: Not on file    Attends meetings of clubs or organizations: Not on file    Relationship status: Not on file  . Intimate partner violence:    Fear of current or ex partner: Not on file    Emotionally abused: Not on file    Physically abused: Not on file    Forced sexual activity: Not on file  Other Topics Concern  . Not on file  Social History Narrative  . Not on file    FAMILY HISTORY:  Family History  Problem Relation Age of Onset  . Leukemia Father   . Cancer  Father   . Heart disease Father   . Heart disease Mother   . Heart disease Other   . Arthritis Unknown   . Asthma Unknown   . Colon cancer Unknown        unknown, possibly father    CURRENT MEDICATIONS:  Outpatient Encounter Medications as of 10/20/2017  Medication Sig Note  . aspirin EC 81 MG tablet Take 81 mg by mouth daily.   Marland Kitchen atorvastatin (LIPITOR) 20 MG tablet Take 20 mg by mouth daily.   . clopidogrel (PLAVIX) 75 MG tablet Take 1 tablet (75 mg total) by mouth daily.   . cyclobenzaprine (FLEXERIL) 10 MG tablet Take 5-10 mg by mouth 3 (three) times daily as needed for muscle spasms.    Marland Kitchen  dexlansoprazole (DEXILANT) 60 MG capsule TAKE (1) CAPSULE BY MOUTH ONCE DAILY.   Marland Kitchen escitalopram (LEXAPRO) 10 MG tablet Take 10 mg by mouth Daily.   . fenofibrate 160 MG tablet  04/21/2016: Received from: External Pharmacy  . folic acid (FOLVITE) 1 MG tablet Take 1 tablet (1 mg total) by mouth daily.   . metoprolol tartrate (LOPRESSOR) 25 MG tablet Take 0.5 tablets (12.5 mg total) by mouth 2 (two) times daily as needed.   . ranitidine (ZANTAC) 150 MG tablet Take 150 mg by mouth at bedtime.   . traMADol (ULTRAM) 50 MG tablet Take 50 mg by mouth every 6 (six) hours as needed.  10/01/2014: Received from: External Pharmacy  . vitamin B-12 (CYANOCOBALAMIN) 1000 MCG tablet Take 1 tablet (1,000 mcg total) by mouth daily.    No facility-administered encounter medications on file as of 10/20/2017.     ALLERGIES:  Allergies  Allergen Reactions  . Latex Rash    Blisters, rash  . Codeine Nausea And Vomiting     PHYSICAL EXAM:  ECOG Performance status: 0-1 - Mildly symptomatic, but remains independent   Vitals:   10/20/17 1137  BP: (!) 156/82  Pulse: 62  Resp: 16  Temp: 98.1 F (36.7 C)  SpO2: 97%   Filed Weights   10/20/17 1137  Weight: 151 lb (68.5 kg)    Physical Exam  Constitutional: She is oriented to person, place, and time and well-developed, well-nourished, and in no distress.  HENT:   Head: Normocephalic.  Mouth/Throat: Oropharynx is clear and moist. No oropharyngeal exudate.  Eyes: Pupils are equal, round, and reactive to light. Conjunctivae are normal. No scleral icterus.  Neck: Normal range of motion. Neck supple.  Cardiovascular: Normal rate and regular rhythm.  Pulmonary/Chest: Effort normal and breath sounds normal. No respiratory distress.  Abdominal: Soft. Bowel sounds are normal. There is no tenderness.  Musculoskeletal: Normal range of motion. She exhibits no edema.  Lymphadenopathy:    She has no cervical adenopathy.       Right: No supraclavicular adenopathy present.       Left: No supraclavicular adenopathy present.  Neurological: She is alert and oriented to person, place, and time. No cranial nerve deficit. Gait normal.  Skin: Skin is warm and dry. No rash noted.  Large area of ecchymosis to (R) posterior upper arm in tricep region.  Appears to be progressing towards healing s/p recent reported fall.   Psychiatric: Mood, memory, affect and judgment normal.  Nursing note and vitals reviewed.    LABORATORY DATA:  I have reviewed the labs as listed.  CBC    Component Value Date/Time   WBC 5.5 10/20/2017 1034   RBC 4.50 10/20/2017 1034   HGB 14.5 10/20/2017 1034   HCT 45.3 10/20/2017 1034   PLT 239 10/20/2017 1034   MCV 100.7 (H) 10/20/2017 1034   MCH 32.2 10/20/2017 1034   MCHC 32.0 10/20/2017 1034   RDW 14.3 10/20/2017 1034   LYMPHSABS 1.2 10/20/2017 1034   MONOABS 0.3 10/20/2017 1034   EOSABS 0.2 10/20/2017 1034   BASOSABS 0.1 10/20/2017 1034   CMP Latest Ref Rng & Units 04/21/2017 08/17/2016 04/21/2016  Glucose 65 - 99 mg/dL 95 116(H) 107(H)  BUN 6 - 20 mg/dL 22(H) 20 16  Creatinine 0.44 - 1.00 mg/dL 1.46(H) 1.36(H) 1.31(H)  Sodium 135 - 145 mmol/L 138 140 138  Potassium 3.5 - 5.1 mmol/L 4.8 3.5 3.8  Chloride 101 - 111 mmol/L 105 106 106  CO2 22 - 32  mmol/L 23 26 24   Calcium 8.9 - 10.3 mg/dL 10.0 9.1 9.3  Total Protein 6.5 - 8.1 g/dL -  6.4(L) -  Total Bilirubin 0.3 - 1.2 mg/dL - 0.2(L) -  Alkaline Phos 38 - 126 U/L - 46 -  AST 15 - 41 U/L - 15 -  ALT 14 - 54 U/L - 15 -    PENDING LABS:  Iron studies, folate, and vitamin B12 pending    DIAGNOSTIC IMAGING:  Last colonoscopy/EGD: 02/26/14 (Dr. Gala Romney)     PATHOLOGY:  Last colonoscopy/EGD: 02/26/14 (Dr. Gala Romney)      ASSESSMENT & PLAN:   Iron deficiency anemia:  -Hgb remains normal at 14.5 g/dL today. Clinically, she feels well. Denies any frank bleeding episodes. She does struggle with chronic fatigue, which is largely stable per her report.    -Last IV iron infusion was 09/02/16 Oncology Flowsheet 09/02/2016  ferumoxytol [facility]) IV 510 mg  -Ferritin, iron/TIBC pending today. We will make arrangements for IV iron, if appropriate based on lab values.  -Based on stability of her hemoglobin and iron studies, will collect labs only in 3 months.  -Return to cancer center in 6 months for follow-up with labs.  If her hemoglobin and iron studies remain stable, then could consider extending her follow-up to annually and/or releasing her from follow-up at the cancer center at that time. Will defer to MD judgment at next follow-up visit for this determination. Patient agrees with this plan.     Lung cancer screening:  A lung cancer screening counseling and shared decision making visit. Age: 64 Pack year smoking history: 40+ years Type of tobacco use: Cigarettes Current smoker or < 15 years of cessation: Current smoker No current symptoms of lung cancer  Risks and benefits of lung cancer screening discussed:  Pros: Discover early stage lung cancer resulting in higher incidence of cure  Cons: Over-diagnosis, radiation exposure, false positives, and additional testing  Patient educated regarding the importance of adherence to continued lung cancer screening.  [place] Preventative Services Task Force recommend annual screening for lung cancer with low-dose CT in adults  aged 81- 70 years who have a 30 pack year smoking history and currently smoke or have quit smoking within the past 15 years.  Screening should be discontinued once a person has not smoked for 15 years or develops a health problem that substantially limits life expectancy or the ability or willingness to have curative lung surgery.  It is a category B recommendation.  Similar stances are provided by CMS, NCCN, and AATS.  Currently, there are no co-morbidities to prevent treatment to therapy for lung cancer and the patient is agreeable to pursue treatment if a malignancy is discovered. Ms. Goza agreed to proceed with lung cancer screening with low-dose CT chest.     Smoking cessation:  -Encouraged continued efforts to cut-back on her cigarette use and stop smoking altogether. She is continuing to work on this and we will continue to provide support.      Folate deficiency:  -Folate level pending for today. Continue folic acid 1 mg daily as directed.   Vitamin B12 deficiency:  -Vitamin B12 deficiency noted by Dr. Talbert Cage ~6 months ago. Patient was started on oral vitamin B12 1000 mcg daily; she is tolerating well.  Vitamin B12 level pending for today. Continue oral supplementation as directed.    Breast cancer screening:  -No current reported breast concerns/problems. Annual screening mammogram due in 11/2017; orders placed today.    Health maintenance/Wellness promotion:  -  Encouraged continued follow-up with PCP for comorbid condition management and other health maintenance/wellness needs.       Dispo:  -Lung cancer screening low-dose CT chest when able (sometime before next follow-up visit); orders placed today.  -Annual screening mammogram due in 11/2017; orders placed today.   -Labs only in 3 months. (CBC with diff, iron studies)  -Return to cancer center in 6 months for follow-up visit and lab. (CBC with diff, BMP, & anemia panel).    All questions were answered to patient's stated  satisfaction. Encouraged patient to call with any new concerns or questions before her next visit to the cancer center and we can certain see her sooner, if needed.       Orders placed this encounter:  Orders Placed This Encounter  Procedures  . CT CHEST LUNG CA SCREEN LOW DOSE W/O CM  . MM SCREENING BREAST TOMO BILATERAL  . Vitamin B12  . Folate  . Iron and TIBC  . Ferritin  . CBC with Differential/Platelet  . Basic metabolic panel  . Ferritin  . Iron and TIBC      Mike Craze, NP [place] [phone]

## 2017-10-20 NOTE — Patient Instructions (Signed)
[place] at [facility] Discharge Instructions  You were seen today by Mike Craze NP. Your mammogram is due in May of this year.  Return in 3 months for labs. Return in 6 months for labs and follow up. Referring you to the lung cancer screening program.    Thank you for choosing [place] at [facility] to provide your oncology and hematology care.  To afford each patient quality time with our provider, please arrive at least 15 minutes before your scheduled appointment time.   If you have a lab appointment with the [facility] please come in thru the  Main Entrance and check in at the main information desk  You need to re-schedule your appointment should you arrive 10 or more minutes late.  We strive to give you quality time with our providers, and arriving late affects you and other patients whose appointments are after yours.  Also, if you no show three or more times for appointments you may be dismissed from the clinic at the providers discretion.     Again, thank you for choosing [facility].  Our hope is that these requests will decrease the amount of time that you wait before being seen by our physicians.       _____________________________________________________________  Should you have questions after your visit to [facility], please contact our office at [phone] between the hours of 8:30 a.m. and 4:30 p.m.  Voicemails left after 4:30 p.m. will not be returned until the following business day.  For prescription refill requests, have your pharmacy contact our office.       Resources For Cancer Patients and their Caregivers ? American Cancer Society: Can assist with transportation, wigs, general needs, runs Look Good Feel Better.        772-381-3850 ? Cancer Care: Provides financial assistance, online support groups, medication/co-pay assistance.  1-800-813-HOPE (907)700-2512) ? [facility] Assists [place] cancer patients and their families through emotional , educational and financial support.  (302)243-9936 ? [place] DSS Where to apply for food stamps, Medicaid and utility assistance. 804-822-7081 ? RCATS: Transportation to medical appointments. 585-649-7811 ? Social Security Administration: May apply for disability if have a Stage IV cancer. 610-860-1572 581-004-4714 ? [employer], Disability and Transit Services: Assists with nutrition, care and transit needs. [place] Support Programs:   > Cancer Support Group  2nd Tuesday of the month 1pm-2pm, Journey Room   > Creative Journey  3rd Tuesday of the month 1130am-1pm, Journey Room

## 2017-11-21 ENCOUNTER — Ambulatory Visit (HOSPITAL_COMMUNITY)
Admission: RE | Admit: 2017-11-21 | Discharge: 2017-11-21 | Disposition: A | Discharge status: Home / Self Care | Payer: Commercial Managed Care - PPO | Source: Ambulatory Visit | Attending: Adult Health | Admitting: Adult Health

## 2017-11-21 DIAGNOSIS — Z1231 Encounter for screening mammogram for malignant neoplasm of breast: Secondary | ICD-10-CM | POA: Insufficient documentation

## 2017-12-14 ENCOUNTER — Other Ambulatory Visit (HOSPITAL_COMMUNITY): Payer: Self-pay | Admitting: *Deleted

## 2017-12-14 DIAGNOSIS — E538 Deficiency of other specified B group vitamins: Secondary | ICD-10-CM

## 2017-12-14 MED ORDER — FOLIC ACID 1 MG PO TABS: 1.0000 mg | ORAL_TABLET | Freq: Every day | ORAL | 4 refills | Status: DC

## 2018-01-23 ENCOUNTER — Other Ambulatory Visit (HOSPITAL_COMMUNITY): Payer: Commercial Managed Care - PPO

## 2018-04-10 ENCOUNTER — Other Ambulatory Visit: Payer: Self-pay | Admitting: Cardiovascular Disease

## 2018-04-10 DIAGNOSIS — I701 Atherosclerosis of renal artery: Secondary | ICD-10-CM

## 2018-04-13 ENCOUNTER — Ambulatory Visit (HOSPITAL_COMMUNITY)
Admission: RE | Admit: 2018-04-13 | Discharge: 2018-04-13 | Disposition: A | Discharge status: Home / Self Care | Payer: Commercial Managed Care - PPO | Source: Ambulatory Visit | Attending: Adult Health | Admitting: Adult Health

## 2018-04-13 DIAGNOSIS — Z122 Encounter for screening for malignant neoplasm of respiratory organs: Secondary | ICD-10-CM | POA: Diagnosis present

## 2018-04-13 DIAGNOSIS — D3501 Benign neoplasm of right adrenal gland: Secondary | ICD-10-CM | POA: Insufficient documentation

## 2018-04-13 DIAGNOSIS — J439 Emphysema, unspecified: Secondary | ICD-10-CM | POA: Diagnosis not present

## 2018-04-13 DIAGNOSIS — R918 Other nonspecific abnormal finding of lung field: Secondary | ICD-10-CM | POA: Insufficient documentation

## 2018-04-18 ENCOUNTER — Ambulatory Visit (HOSPITAL_BASED_OUTPATIENT_CLINIC_OR_DEPARTMENT_OTHER)
Admission: RE | Admit: 2018-04-18 | Discharge: 2018-04-18 | Disposition: A | Discharge status: Home / Self Care | Payer: Commercial Managed Care - PPO | Source: Ambulatory Visit | Attending: Cardiovascular Disease | Admitting: Cardiovascular Disease

## 2018-04-18 ENCOUNTER — Ambulatory Visit (HOSPITAL_COMMUNITY)
Admission: RE | Admit: 2018-04-18 | Discharge: 2018-04-18 | Disposition: A | Discharge status: Home / Self Care | Payer: Commercial Managed Care - PPO | Source: Ambulatory Visit | Attending: Cardiovascular Disease | Admitting: Cardiovascular Disease

## 2018-04-18 DIAGNOSIS — I739 Peripheral vascular disease, unspecified: Secondary | ICD-10-CM | POA: Insufficient documentation

## 2018-04-18 DIAGNOSIS — I701 Atherosclerosis of renal artery: Secondary | ICD-10-CM | POA: Insufficient documentation

## 2018-04-19 ENCOUNTER — Other Ambulatory Visit: Payer: Self-pay | Admitting: *Deleted

## 2018-04-19 DIAGNOSIS — I739 Peripheral vascular disease, unspecified: Secondary | ICD-10-CM

## 2018-04-19 DIAGNOSIS — I701 Atherosclerosis of renal artery: Secondary | ICD-10-CM

## 2018-04-25 ENCOUNTER — Inpatient Hospital Stay (HOSPITAL_COMMUNITY): Payer: Commercial Managed Care - PPO

## 2018-04-25 ENCOUNTER — Inpatient Hospital Stay (HOSPITAL_COMMUNITY): Payer: Commercial Managed Care - PPO | Attending: Hematology | Admitting: Hematology

## 2018-04-25 ENCOUNTER — Other Ambulatory Visit: Payer: Self-pay | Admitting: Cardiovascular Disease

## 2018-04-25 ENCOUNTER — Encounter (HOSPITAL_COMMUNITY): Payer: Self-pay | Admitting: Hematology

## 2018-04-25 VITALS — BP 148/94 | HR 73 | Temp 98.1°F | Resp 18 | Wt 158.0 lb

## 2018-04-25 DIAGNOSIS — I129 Hypertensive chronic kidney disease with stage 1 through stage 4 chronic kidney disease, or unspecified chronic kidney disease: Secondary | ICD-10-CM

## 2018-04-25 DIAGNOSIS — E538 Deficiency of other specified B group vitamins: Secondary | ICD-10-CM | POA: Insufficient documentation

## 2018-04-25 DIAGNOSIS — E611 Iron deficiency: Secondary | ICD-10-CM

## 2018-04-25 DIAGNOSIS — N189 Chronic kidney disease, unspecified: Secondary | ICD-10-CM | POA: Insufficient documentation

## 2018-04-25 DIAGNOSIS — F17210 Nicotine dependence, cigarettes, uncomplicated: Secondary | ICD-10-CM | POA: Diagnosis not present

## 2018-04-25 LAB — CBC WITH DIFFERENTIAL/PLATELET
Abs Immature Granulocytes: 0.01 10*3/uL (ref 0.00–0.07)
Basophils Absolute: 0.1 10*3/uL (ref 0.0–0.1)
Basophils Relative: 1 %
EOS ABS: 0.2 10*3/uL (ref 0.0–0.5)
Eosinophils Relative: 4 %
HCT: 44 % (ref 36.0–46.0)
Hemoglobin: 14.3 g/dL (ref 12.0–15.0)
Immature Granulocytes: 0 %
Lymphocytes Relative: 26 %
Lymphs Abs: 1.2 10*3/uL (ref 0.7–4.0)
MCH: 33.2 pg (ref 26.0–34.0)
MCHC: 32.5 g/dL (ref 30.0–36.0)
MCV: 102.1 fL — AB (ref 80.0–100.0)
MONO ABS: 0.2 10*3/uL (ref 0.1–1.0)
MONOS PCT: 5 %
Neutro Abs: 2.9 10*3/uL (ref 1.7–7.7)
Neutrophils Relative %: 64 %
PLATELETS: 157 10*3/uL (ref 150–400)
RBC: 4.31 MIL/uL (ref 3.87–5.11)
RDW: 13.4 % (ref 11.5–15.5)
WBC: 4.5 10*3/uL (ref 4.0–10.5)
nRBC: 0 % (ref 0.0–0.2)

## 2018-04-25 LAB — FERRITIN: FERRITIN: 46 ng/mL (ref 11–307)

## 2018-04-25 LAB — IRON AND TIBC
IRON: 93 ug/dL (ref 28–170)
Saturation Ratios: 25 % (ref 10.4–31.8)
TIBC: 378 ug/dL (ref 250–450)
UIBC: 285 ug/dL

## 2018-04-25 LAB — BASIC METABOLIC PANEL
ANION GAP: 9 (ref 5–15)
BUN: 21 mg/dL — ABNORMAL HIGH (ref 6–20)
CALCIUM: 9.1 mg/dL (ref 8.9–10.3)
CO2: 20 mmol/L — AB (ref 22–32)
Chloride: 110 mmol/L (ref 98–111)
Creatinine, Ser: 1.07 mg/dL — ABNORMAL HIGH (ref 0.44–1.00)
GFR calc Af Amer: >60 mL/min (ref >60)
GFR, EST NON AFRICAN AMERICAN: 57 mL/min — AB (ref >60)
GLUCOSE: 130 mg/dL — AB (ref 70–99)
POTASSIUM: 4.1 mmol/L (ref 3.5–5.1)
Sodium: 139 mmol/L (ref 135–145)

## 2018-04-25 LAB — FOLATE: Folate: 67.3 ng/mL (ref >5.9)

## 2018-04-25 LAB — VITAMIN B12: Vitamin B-12: 2146 pg/mL — ABNORMAL HIGH (ref 180–914)

## 2018-04-25 NOTE — Assessment & Plan Note (Signed)
1.  Iron deficiency state: - Has received intermittent parenteral iron therapy from 2015 through 2018, last Feraheme on 09/02/2016. - Denies any bleeding per rectum or melena.  Started to feel tired lately. -I have reviewed her blood counts which showed hemoglobin of 14.3 with an MCV of 102.  TIBC was high at 591.  Percent saturation was 19.  Ferritin has decreased to 78.  This was previously able 100. - She also has underlying chronic kidney disease which can also make her hemoglobin worse.  Last creatinine was 1.46. - I have recommended one infusion of Feraheme.  She will be reevaluated in 6 months with repeat blood work.  2.  B12 deficiency: - She is taking 2 mg of B12 daily without fail.  However her B12 level was low at 203.  She also complains of numbness in her left leg. -I have recommended starting her on monthly B12 injections.  I will reevaluate her in 6 months.  She is also continuing to take folic acid supplements.  3.  Smoking history: - She smokes 1 pack/day for more than 50 years. -I have reviewed results of the CT of the chest dated 04/13/2018 shows lung-RADS 2. - Another low-dose CT scan of the chest was recommended in 12 months.  4.  Health maintenance: -I have reviewed the results of the mammogram dated 11/21/2017 which was BI-RADS 1.

## 2018-04-25 NOTE — Patient Instructions (Signed)
[facility] at [facility]  Discharge Instructions:   _______________________________________________________________  Thank you for choosing [facility] at [facility] to provide your oncology and hematology care.  To afford each patient quality time with our providers, please arrive at least 15 minutes before your scheduled appointment.  You need to re-schedule your appointment if you arrive 10 or more minutes late.  We strive to give you quality time with our providers, and arriving late affects you and other patients whose appointments are after yours.  Also, if you no show three or more times for appointments you may be dismissed from the clinic.  Again, thank you for choosing [facility]. Our hope is that these requests will allow you access to exceptional care and in a timely manner. _______________________________________________________________  If you have questions after your visit, please contact our office at [phone] between the hours of 8:30 a.m. and 5:00 p.m. Voicemails left after 4:30 p.m. will not be returned until the following business day. _______________________________________________________________  For prescription refill requests, have your pharmacy contact our office. _______________________________________________________________  Recommendations made by the consultant and any test results will be sent to your referring physician. _______________________________________________________________ 

## 2018-04-25 NOTE — Progress Notes (Signed)
Connie Farrell, [place] [address]   CLINIC:  Medical Oncology/Hematology  PCP:  Celene Squibb, MD [address] [place] [address] [phone]   REASON FOR VISIT: Follow-up for Iron deficiency anemia AND folate deficiency AND vitamin B12 deficiency  CURRENT THERAPY: IV iron as needed AND 1 mg po folic acid daily AND vitamin B12 1000 mcg po daily    INTERVAL HISTORY:  Connie Farrell 57 y.o. female returns for routine follow-up for iron deficiency anemia, folate deficiency, and vitamin B12 deficiency. She has numbness and tingling in her left leg and foot for about 4 months. She is still smoking daily. She is starting to feel more fatigued. She reports her appetite at 50%. Her energy level is 50%. She denies any new pains. Denies any fevers or night sweats. Denies any recent infections or hospitalizations.     REVIEW OF SYSTEMS:  Review of Systems  Neurological: Positive for numbness (left leg).  All other systems reviewed and are negative.    PAST MEDICAL/SURGICAL HISTORY:  Past Medical History:  Diagnosis Date  . Anxiety   . Arthritis    "hands; spine too" (04/04/2012)  . Exertional dyspnea   . Folate deficiency 10/17/2015  . GERD (gastroesophageal reflux disease)   . Hyperlipemia   . Hypertension   . Macrocytosis 10/17/2015  . Neck pain, chronic   . PAD (peripheral artery disease) ([place])   . Renal artery stenosis ([place])   . Seasonal allergies   . Tobacco abuse    Past Surgical History:  Procedure Laterality Date  . ABDOMINAL ANGIOGRAM  04/04/2012   Procedure: ABDOMINAL ANGIOGRAM;  Surgeon: Lorretta Harp, MD;  Location: [facility] CATH LAB;  Service: Cardiovascular;;  . ANTERIOR CERVICAL DECOMP/DISCECTOMY FUSION  ~ 04/2006   C6, 7, T1  . COLONOSCOPY N/A 02/26/2014   Adequate preparation. Minimal anal canal hemorrhoids;otherwise, normal rectumdiminutive polyps in the middescending segment; otherwise, the remainder of colonic  mucosaappeared normal. Hyperplastic polyps.  . ESOPHAGOGASTRODUODENOSCOPY N/A 02/26/2014   PPI:RJJOACZY distal esophagus, query short segment Barrett's. Status post Venia Minks dilation with esophageal biopsy. Abnormal gastric mucosa. Esophageal biopsy negative for Barrett's. Gastric biopsy showed mild chronic gastritis, no H pylori.  Marland Kitchen LAPAROSCOPIC APPENDECTOMY N/A 09/10/2013   Procedure: APPENDECTOMY LAPAROSCOPIC;  Surgeon: Jamesetta So, MD;  Location: [facility] ORS;  Service: General;  Laterality: N/A;  . LOWER EXTREMITY ANGIOGRAM N/A 04/04/2012   Procedure: LOWER EXTREMITY ANGIOGRAM;  Surgeon: Lorretta Harp, MD;  Location: [facility] CATH LAB;  Service: Cardiovascular;  Laterality: N/A;  . Venia Minks DILATION N/A 02/26/2014   Procedure: Venia Minks DILATION;  Surgeon: Daneil Dolin, MD;  Location: [facility] ENDO SUITE;  Service: Endoscopy;  Laterality: N/A;  . PERIPHERAL ARTERIAL STENT GRAFT  04/04/2012  . RENAL ANGIOGRAM Left 04/22/2014   Procedure: RENAL ANGIOGRAM;  Surgeon: Lorretta Harp, MD;  Location: [facility] CATH LAB;  Service: Cardiovascular;  Laterality: Left;  . TUBAL LIGATION  1990     SOCIAL HISTORY:  Social History   Socioeconomic History  . Marital status: Married    Spouse name: Not on file  . Number of children: Not on file  . Years of education: Not on file  . Highest education level: Not on file  Occupational History  . 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 Financial resource strain: Not on file  . Food insecurity:    Worry: Not  on file    Inability: Not on file  . Transportation needs:    Medical: Not on file    Non-medical: Not on file  Tobacco Use  . Smoking status: Current Every Day Smoker    Packs/day: 0.75    Years: 36.00    Pack years: 27.00    Types: Cigarettes  . Smokeless tobacco: Never Used  Substance and Sexual Activity  . Alcohol use: No  . Drug use: No  . Sexual activity: Never  Lifestyle  . Physical  activity:    Days per week: Not on file    Minutes per session: Not on file  . Stress: Not on file  Relationships  . Social connections:    Talks on phone: Not on file    Gets together: Not on file    Attends religious service: Not on file    Active member of club or organization: Not on file    Attends meetings of clubs or organizations: Not on file    Relationship status: Not on file  . Intimate partner violence:    Fear of current or ex partner: Not on file    Emotionally abused: Not on file    Physically abused: Not on file    Forced sexual activity: Not on file  Other Topics Concern  . Not on file  Social History Narrative  . Not on file    FAMILY HISTORY:  Family History  Problem Relation Age of Onset  . Leukemia Father   . Cancer Father   . Heart disease Father   . Heart disease Mother   . Heart disease Other   . Arthritis Unknown   . Asthma Unknown   . Colon cancer Unknown        unknown, possibly father    CURRENT MEDICATIONS:  Outpatient Encounter Medications as of 04/25/2018  Medication Sig Note  . aspirin EC 81 MG tablet Take 81 mg by mouth daily.   Marland Kitchen atorvastatin (LIPITOR) 20 MG tablet Take 20 mg by mouth daily.   . clopidogrel (PLAVIX) 75 MG tablet Take 1 tablet (75 mg total) by mouth daily.   . clopidogrel (PLAVIX) 75 MG tablet Take 1 tablet (75 mg total) by mouth daily. Please schedule yearly appt for more refills, thanks! 1st attmpt   . cyclobenzaprine (FLEXERIL) 10 MG tablet Take 5-10 mg by mouth 3 (three) times daily as needed for muscle spasms.    Marland Kitchen dexlansoprazole (DEXILANT) 60 MG capsule TAKE (1) CAPSULE BY MOUTH ONCE DAILY.   Marland Kitchen escitalopram (LEXAPRO) 10 MG tablet Take 10 mg by mouth Daily.   . folic acid (FOLVITE) 1 MG tablet Take 1 tablet (1 mg total) by mouth daily.   . metoprolol tartrate (LOPRESSOR) 25 MG tablet Take 0.5 tablets (12.5 mg total) by mouth 2 (two) times daily as needed.   . ranitidine (ZANTAC) 150 MG tablet Take 150 mg by mouth  at bedtime.   . traMADol (ULTRAM) 50 MG tablet Take 50 mg by mouth every 6 (six) hours as needed.  10/01/2014: Received from: External Pharmacy  . vitamin B-12 (CYANOCOBALAMIN) 1000 MCG tablet Take 1 tablet (1,000 mcg total) by mouth daily.   . [DISCONTINUED] fenofibrate 160 MG tablet  04/21/2016: Received from: External Pharmacy   No facility-administered encounter medications on file as of 04/25/2018.     ALLERGIES:  Allergies  Allergen Reactions  . Latex Rash    Blisters, rash  . Codeine Nausea And Vomiting     PHYSICAL  EXAM:  ECOG Performance status: 1  Vitals:   04/25/18 1414  BP: (!) 148/94  Pulse: 73  Resp: 18  Temp: 98.1 F (36.7 C)  SpO2: 97%   Filed Weights   04/25/18 1414  Weight: 158 lb (71.7 kg)    Physical Exam  Constitutional: She is oriented to person, place, and time. She appears well-developed and well-nourished.  Abdominal: Soft.  Musculoskeletal: Normal range of motion.  Neurological: She is alert and oriented to person, place, and time.  Skin: Skin is warm and dry.  Psychiatric: She has a normal mood and affect. Her behavior is normal. Judgment and thought content normal.   Abdomen is soft, nontender with no palpable abnormality.  LABORATORY DATA:  I have reviewed the labs as listed.  CBC    Component Value Date/Time   WBC 4.5 04/25/2018 1355   RBC 4.31 04/25/2018 1355   HGB 14.3 04/25/2018 1355   HCT 44.0 04/25/2018 1355   PLT 157 04/25/2018 1355   MCV 102.1 (H) 04/25/2018 1355   MCH 33.2 04/25/2018 1355   MCHC 32.5 04/25/2018 1355   RDW 13.4 04/25/2018 1355   LYMPHSABS 1.2 04/25/2018 1355   MONOABS 0.2 04/25/2018 1355   EOSABS 0.2 04/25/2018 1355   BASOSABS 0.1 04/25/2018 1355   CMP Latest Ref Rng & Units 04/25/2018 04/21/2017 08/17/2016  Glucose 70 - 99 mg/dL 130(H) 95 116(H)  BUN 6 - 20 mg/dL 21(H) 22(H) 20  Creatinine 0.44 - 1.00 mg/dL 1.07(H) 1.46(H) 1.36(H)  Sodium 135 - 145 mmol/L 139 138 140  Potassium 3.5 - 5.1 mmol/L 4.1  4.8 3.5  Chloride 98 - 111 mmol/L 110 105 106  CO2 22 - 32 mmol/L 20(L) 23 26  Calcium 8.9 - 10.3 mg/dL 9.1 10.0 9.1  Total Protein 6.5 - 8.1 g/dL - - 6.4(L)  Total Bilirubin 0.3 - 1.2 mg/dL - - 0.2(L)  Alkaline Phos 38 - 126 U/L - - 46  AST 15 - 41 U/L - - 15  ALT 14 - 54 U/L - - 15       DIAGNOSTIC IMAGING:  I have independently reviewed her CT scan of the chest and mammogram and discussed with her.     ASSESSMENT & PLAN:   Iron deficiency 1.  Iron deficiency state: - Has received intermittent parenteral iron therapy from 2015 through 2018, last Feraheme on 09/02/2016. - Denies any bleeding per rectum or melena.  Started to feel tired lately. -I have reviewed her blood counts which showed hemoglobin of 14.3 with an MCV of 102.  TIBC was high at 591.  Percent saturation was 19.  Ferritin has decreased to 78.  This was previously able 100. - She also has underlying chronic kidney disease which can also make her hemoglobin worse.  Last creatinine was 1.46. - I have recommended one infusion of Feraheme.  She will be reevaluated in 6 months with repeat blood work.  2.  B12 deficiency: - She is taking 2 mg of B12 daily without fail.  However her B12 level was low at 203.  She also complains of numbness in her left leg. -I have recommended starting her on monthly B12 injections.  I will reevaluate her in 6 months.  She is also continuing to take folic acid supplements.  3.  Smoking history: - She smokes 1 pack/day for more than 50 years. -I have reviewed results of the CT of the chest dated 04/13/2018 shows lung-RADS 2. - Another low-dose CT scan of the chest  was recommended in 12 months.  4.  Health maintenance: -I have reviewed the results of the mammogram dated 11/21/2017 which was BI-RADS 1.      Orders placed this encounter:  Orders Placed This Encounter  Procedures  . CBC with Differential/Platelet  . Comprehensive metabolic panel  . Ferritin  . Iron and TIBC  .  Vitamin B12  . Folate      Derek Jack, MD [place] [phone]

## 2018-05-01 ENCOUNTER — Inpatient Hospital Stay (HOSPITAL_COMMUNITY): Payer: Commercial Managed Care - PPO

## 2018-05-01 VITALS — BP 173/85 | HR 65 | Temp 97.8°F | Resp 18 | Wt 156.8 lb

## 2018-05-01 DIAGNOSIS — E611 Iron deficiency: Secondary | ICD-10-CM

## 2018-05-01 MED ORDER — SODIUM CHLORIDE 0.9 % IV SOLN
INTRAVENOUS | Status: DC
  Administered 2018-05-01: 11:00:00 via INTRAVENOUS

## 2018-05-01 MED ORDER — CYANOCOBALAMIN 1000 MCG/ML IJ SOLN
1000.0000 ug | Freq: Once | INTRAMUSCULAR | Status: AC
  Administered 2018-05-01: 1000 ug via INTRAMUSCULAR

## 2018-05-01 MED ORDER — SODIUM CHLORIDE 0.9 % IV SOLN
510.0000 mg | Freq: Once | INTRAVENOUS | Status: AC
  Administered 2018-05-01: 510 mg via INTRAVENOUS
  Filled 2018-05-01: qty 17

## 2018-05-01 NOTE — Patient Instructions (Signed)
[place] at [facility]  Discharge Instructions:  You received an iron infusion and a B12 injection today. Follow up as scheduled. Call clinic for any questions or concerns.  _______________________________________________________________  Thank you for choosing [place] at [facility] to provide your oncology and hematology care.  To afford each patient quality time with our providers, please arrive at least 15 minutes before your scheduled appointment.  You need to re-schedule your appointment if you arrive 10 or more minutes late.  We strive to give you quality time with our providers, and arriving late affects you and other patients whose appointments are after yours.  Also, if you no show three or more times for appointments you may be dismissed from the clinic.  Again, thank you for choosing [place] at [facility] hope is that these requests will allow you access to exceptional care and in a timely manner. _______________________________________________________________  If you have questions after your visit, please contact our office at [phone] between the hours of 8:30 a.m. and 5:00 p.m. Voicemails left after 4:30 p.m. will not be returned until the following business day. _______________________________________________________________  For prescription refill requests, have your pharmacy contact our office. _______________________________________________________________  Recommendations made by the consultant and any test results will be sent to your referring physician. _______________________________________________________________

## 2018-05-01 NOTE — Progress Notes (Signed)
Connie Farrell tolerated Feraheme and B12 without incident or complaint. VSS upon completion of treatment. Discharged self ambulatory in satisfactory condition.

## 2018-05-14 ENCOUNTER — Other Ambulatory Visit: Payer: Self-pay | Admitting: Cardiovascular Disease

## 2018-05-15 ENCOUNTER — Other Ambulatory Visit (HOSPITAL_COMMUNITY): Payer: Self-pay | Admitting: *Deleted

## 2018-05-15 NOTE — Telephone Encounter (Signed)
Rx request sent to pharmacy.  

## 2018-05-26 ENCOUNTER — Other Ambulatory Visit (HOSPITAL_COMMUNITY): Payer: Self-pay | Admitting: *Deleted

## 2018-05-26 DIAGNOSIS — K2100 Gastro-esophageal reflux disease with esophagitis, without bleeding: Secondary | ICD-10-CM

## 2018-05-26 MED ORDER — DEXLANSOPRAZOLE 60 MG PO CPDR: DELAYED_RELEASE_CAPSULE | ORAL | 1 refills | Status: DC

## 2018-06-01 ENCOUNTER — Encounter (HOSPITAL_COMMUNITY): Payer: Self-pay

## 2018-06-01 ENCOUNTER — Inpatient Hospital Stay (HOSPITAL_COMMUNITY): Payer: Commercial Managed Care - PPO | Attending: Hematology

## 2018-06-01 VITALS — BP 167/82 | HR 68 | Temp 98.4°F | Resp 18

## 2018-06-01 DIAGNOSIS — E611 Iron deficiency: Secondary | ICD-10-CM | POA: Diagnosis present

## 2018-06-01 DIAGNOSIS — E538 Deficiency of other specified B group vitamins: Secondary | ICD-10-CM | POA: Insufficient documentation

## 2018-06-01 DIAGNOSIS — Z23 Encounter for immunization: Secondary | ICD-10-CM | POA: Diagnosis not present

## 2018-06-01 MED ORDER — CYANOCOBALAMIN 1000 MCG/ML IJ SOLN
INTRAMUSCULAR | Status: AC
  Filled 2018-06-01: qty 1

## 2018-06-01 MED ORDER — CYANOCOBALAMIN 1000 MCG/ML IJ SOLN
1000.0000 ug | Freq: Once | INTRAMUSCULAR | Status: AC
  Administered 2018-06-01: 1000 ug via INTRAMUSCULAR

## 2018-06-01 MED ORDER — INFLUENZA VAC SPLIT QUAD 0.5 ML IM SUSY
0.5000 mL | PREFILLED_SYRINGE | Freq: Once | INTRAMUSCULAR | Status: AC
  Administered 2018-06-01: 0.5 mL via INTRAMUSCULAR

## 2018-06-01 MED ORDER — INFLUENZA VAC SPLIT QUAD 0.5 ML IM SUSY
PREFILLED_SYRINGE | INTRAMUSCULAR | Status: AC
  Filled 2018-06-01: qty 0.5

## 2018-06-01 NOTE — Patient Instructions (Signed)
[place] at [facility] Discharge Instructions Received Vit B12 injection and Influenza vaccine today. Follow-up as scheduled. Call clinic for any questions or concerns   Thank you for choosing [place] at [facility] to provide your oncology and hematology care.  To afford each patient quality time with our provider, please arrive at least 15 minutes before your scheduled appointment time.   If you have a lab appointment with the [facility] please come in thru the  Main Entrance and check in at the main information desk  You need to re-schedule your appointment should you arrive 10 or more minutes late.  We strive to give you quality time with our providers, and arriving late affects you and other patients whose appointments are after yours.  Also, if you no show three or more times for appointments you may be dismissed from the clinic at the providers discretion.     Again, thank you for choosing [facility].  Our hope is that these requests will decrease the amount of time that you wait before being seen by our physicians.       _____________________________________________________________  Should you have questions after your visit to [facility], please contact our office at [phone] between the hours of 8:00 a.m. and 4:30 p.m.  Voicemails left after 4:00 p.m. will not be returned until the following business day.  For prescription refill requests, have your pharmacy contact our office and allow 72 hours.    Cancer Center Support Programs:   > Cancer Support Group  2nd Tuesday of the month 1pm-2pm, Journey Room

## 2018-06-01 NOTE — Progress Notes (Signed)
Connie Farrell tolerated Vit B12 injection and Influenza vaccine well without complaints or incident. VSS Pt discharged self ambulatory in satisfactory condition

## 2018-06-03 ENCOUNTER — Other Ambulatory Visit: Payer: Self-pay | Admitting: Cardiovascular Disease

## 2018-06-05 NOTE — Telephone Encounter (Signed)
Rx request sent to pharmacy.  

## 2018-06-23 ENCOUNTER — Other Ambulatory Visit: Payer: Self-pay

## 2018-06-23 MED ORDER — CLOPIDOGREL BISULFATE 75 MG PO TABS: 75.0000 mg | ORAL_TABLET | Freq: Every day | ORAL | 0 refills | Status: DC

## 2018-06-23 NOTE — Telephone Encounter (Signed)
LM2CB-PT IS OVERDUE FOR FOLLOW UP APPT

## 2018-06-29 ENCOUNTER — Inpatient Hospital Stay (HOSPITAL_COMMUNITY): Payer: Commercial Managed Care - PPO | Attending: Hematology

## 2018-06-29 ENCOUNTER — Encounter (HOSPITAL_COMMUNITY): Payer: Self-pay

## 2018-06-29 VITALS — BP 151/83 | HR 82 | Temp 97.8°F | Resp 18

## 2018-06-29 DIAGNOSIS — E538 Deficiency of other specified B group vitamins: Secondary | ICD-10-CM | POA: Diagnosis not present

## 2018-06-29 DIAGNOSIS — D509 Iron deficiency anemia, unspecified: Secondary | ICD-10-CM | POA: Insufficient documentation

## 2018-06-29 DIAGNOSIS — E611 Iron deficiency: Secondary | ICD-10-CM

## 2018-06-29 MED ORDER — CYANOCOBALAMIN 1000 MCG/ML IJ SOLN
1000.0000 ug | Freq: Once | INTRAMUSCULAR | Status: AC
  Administered 2018-06-29: 1000 ug via INTRAMUSCULAR

## 2018-06-29 MED ORDER — CYANOCOBALAMIN 1000 MCG/ML IJ SOLN
INTRAMUSCULAR | Status: AC
  Filled 2018-06-29: qty 1

## 2018-06-29 NOTE — Patient Instructions (Signed)
[facility] at [facility]  Discharge Instructions:   _______________________________________________________________  Thank you for choosing [facility] at [facility] to provide your oncology and hematology care.  To afford each patient quality time with our providers, please arrive at least 15 minutes before your scheduled appointment.  You need to re-schedule your appointment if you arrive 10 or more minutes late.  We strive to give you quality time with our providers, and arriving late affects you and other patients whose appointments are after yours.  Also, if you no show three or more times for appointments you may be dismissed from the clinic.  Again, thank you for choosing [facility] at [facility]. Our hope is that these requests will allow you access to exceptional care and in a timely manner. _______________________________________________________________  If you have questions after your visit, please contact our office at [phone] between the hours of 8:30 a.m. and 5:00 p.m. Voicemails left after 4:30 p.m. will not be returned until the following business day. _______________________________________________________________  For prescription refill requests, have your pharmacy contact our office. _______________________________________________________________  Recommendations made by the consultant and any test results will be sent to your referring physician. _______________________________________________________________ 

## 2018-06-29 NOTE — Progress Notes (Signed)
Patient tolerated injection with no complaints voiced.  Band aid applied.  No bruising or swelling noted at site.  VSS with discharge and left ambulatory with no s/s of distress noted.

## 2018-07-27 ENCOUNTER — Encounter (HOSPITAL_COMMUNITY): Payer: Self-pay

## 2018-07-27 ENCOUNTER — Inpatient Hospital Stay (HOSPITAL_COMMUNITY): Payer: Commercial Managed Care - PPO | Attending: Hematology

## 2018-07-27 VITALS — BP 163/79 | HR 78 | Temp 98.2°F | Resp 18

## 2018-07-27 DIAGNOSIS — D509 Iron deficiency anemia, unspecified: Secondary | ICD-10-CM | POA: Insufficient documentation

## 2018-07-27 DIAGNOSIS — E538 Deficiency of other specified B group vitamins: Secondary | ICD-10-CM | POA: Diagnosis not present

## 2018-07-27 DIAGNOSIS — N189 Chronic kidney disease, unspecified: Secondary | ICD-10-CM | POA: Diagnosis not present

## 2018-07-27 DIAGNOSIS — I129 Hypertensive chronic kidney disease with stage 1 through stage 4 chronic kidney disease, or unspecified chronic kidney disease: Secondary | ICD-10-CM | POA: Insufficient documentation

## 2018-07-27 DIAGNOSIS — E611 Iron deficiency: Secondary | ICD-10-CM

## 2018-07-27 MED ORDER — CYANOCOBALAMIN 1000 MCG/ML IJ SOLN
1000.0000 ug | Freq: Once | INTRAMUSCULAR | Status: AC
  Administered 2018-07-27: 1000 ug via INTRAMUSCULAR

## 2018-07-27 MED ORDER — CYANOCOBALAMIN 1000 MCG/ML IJ SOLN
INTRAMUSCULAR | Status: AC
  Filled 2018-07-27: qty 1

## 2018-07-27 NOTE — Patient Instructions (Signed)
[facility] at [facility]  Discharge Instructions:   _______________________________________________________________  Thank you for choosing [facility] at [facility] to provide your oncology and hematology care.  To afford each patient quality time with our providers, please arrive at least 15 minutes before your scheduled appointment.  You need to re-schedule your appointment if you arrive 10 or more minutes late.  We strive to give you quality time with our providers, and arriving late affects you and other patients whose appointments are after yours.  Also, if you no show three or more times for appointments you may be dismissed from the clinic.  Again, thank you for choosing [facility] at [facility]. Our hope is that these requests will allow you access to exceptional care and in a timely manner. _______________________________________________________________  If you have questions after your visit, please contact our office at [phone] between the hours of 8:30 a.m. and 5:00 p.m. Voicemails left after 4:30 p.m. will not be returned until the following business day. _______________________________________________________________  For prescription refill requests, have your pharmacy contact our office. _______________________________________________________________  Recommendations made by the consultant and any test results will be sent to your referring physician. _______________________________________________________________ 

## 2018-07-27 NOTE — Progress Notes (Signed)
Patient tolerated injection with no complaints voiced.  Site clean and dry.  Band aid applied.  Discharged in stable condition with no s/s of distress noted.

## 2018-08-24 ENCOUNTER — Inpatient Hospital Stay (HOSPITAL_COMMUNITY): Payer: Commercial Managed Care - PPO | Attending: Hematology

## 2018-08-24 VITALS — BP 142/85 | HR 71 | Temp 97.6°F | Resp 16

## 2018-08-24 DIAGNOSIS — D538 Other specified nutritional anemias: Secondary | ICD-10-CM | POA: Diagnosis not present

## 2018-08-24 DIAGNOSIS — E538 Deficiency of other specified B group vitamins: Secondary | ICD-10-CM | POA: Diagnosis present

## 2018-08-24 DIAGNOSIS — E611 Iron deficiency: Secondary | ICD-10-CM | POA: Insufficient documentation

## 2018-08-24 MED ORDER — CYANOCOBALAMIN 1000 MCG/ML IJ SOLN
INTRAMUSCULAR | Status: AC
  Filled 2018-08-24: qty 1

## 2018-08-24 MED ORDER — CYANOCOBALAMIN 1000 MCG/ML IJ SOLN
1000.0000 ug | Freq: Once | INTRAMUSCULAR | Status: AC
  Administered 2018-08-24: 1000 ug via INTRAMUSCULAR

## 2018-08-24 NOTE — Progress Notes (Signed)
Pt here today for monthly B12 injection. Pt given injection in left deltoid. Pt tolerated injection weill with no complaints. Pt stable and discharged home ambulatory. Pt to continue monthly B12 injections. Return as scheduled.

## 2018-08-30 ENCOUNTER — Other Ambulatory Visit: Payer: Self-pay | Admitting: Cardiovascular Disease

## 2018-09-21 ENCOUNTER — Encounter (HOSPITAL_COMMUNITY): Payer: Self-pay

## 2018-09-21 ENCOUNTER — Inpatient Hospital Stay (HOSPITAL_COMMUNITY): Payer: Commercial Managed Care - PPO | Attending: Hematology

## 2018-09-21 VITALS — BP 157/87 | HR 68 | Temp 97.9°F | Resp 16

## 2018-09-21 DIAGNOSIS — E538 Deficiency of other specified B group vitamins: Secondary | ICD-10-CM | POA: Diagnosis present

## 2018-09-21 DIAGNOSIS — D509 Iron deficiency anemia, unspecified: Secondary | ICD-10-CM | POA: Insufficient documentation

## 2018-09-21 DIAGNOSIS — E611 Iron deficiency: Secondary | ICD-10-CM

## 2018-09-21 MED ORDER — CYANOCOBALAMIN 1000 MCG/ML IJ SOLN
1000.0000 ug | Freq: Once | INTRAMUSCULAR | Status: AC
  Administered 2018-09-21: 1000 ug via INTRAMUSCULAR
  Filled 2018-09-21: qty 1

## 2018-09-21 NOTE — Patient Instructions (Signed)
[facility] at [facility]  Discharge Instructions:   _______________________________________________________________  Thank you for choosing [facility] at [facility] to provide your oncology and hematology care.  To afford each patient quality time with our providers, please arrive at least 15 minutes before your scheduled appointment.  You need to re-schedule your appointment if you arrive 10 or more minutes late.  We strive to give you quality time with our providers, and arriving late affects you and other patients whose appointments are after yours.  Also, if you no show three or more times for appointments you may be dismissed from the clinic.  Again, thank you for choosing [facility] at [facility]. Our hope is that these requests will allow you access to exceptional care and in a timely manner. _______________________________________________________________  If you have questions after your visit, please contact our office at [phone] between the hours of 8:30 a.m. and 5:00 p.m. Voicemails left after 4:30 p.m. will not be returned until the following business day. _______________________________________________________________  For prescription refill requests, have your pharmacy contact our office. _______________________________________________________________  Recommendations made by the consultant and any test results will be sent to your referring physician. _______________________________________________________________ 

## 2018-09-21 NOTE — Progress Notes (Signed)
Patient tolerated injection with no complaints voiced.  Site clean and dry with no bruising or swelling noted at site.  Band aid applied.  Vss with discharge and left ambulatory with no s/s of distress noted.  

## 2018-09-23 ENCOUNTER — Other Ambulatory Visit: Payer: Self-pay | Admitting: Cardiovascular Disease

## 2018-10-11 ENCOUNTER — Other Ambulatory Visit: Payer: Self-pay | Admitting: Cardiovascular Disease

## 2018-10-25 ENCOUNTER — Other Ambulatory Visit (HOSPITAL_COMMUNITY): Payer: Commercial Managed Care - PPO

## 2018-10-26 ENCOUNTER — Ambulatory Visit (HOSPITAL_COMMUNITY): Payer: Commercial Managed Care - PPO | Admitting: Hematology

## 2018-10-26 ENCOUNTER — Ambulatory Visit (HOSPITAL_COMMUNITY): Payer: Commercial Managed Care - PPO

## 2018-11-14 ENCOUNTER — Other Ambulatory Visit (HOSPITAL_COMMUNITY): Payer: Self-pay | Admitting: Hematology

## 2018-11-14 DIAGNOSIS — K2100 Gastro-esophageal reflux disease with esophagitis, without bleeding: Secondary | ICD-10-CM

## 2018-12-22 ENCOUNTER — Inpatient Hospital Stay (HOSPITAL_COMMUNITY): Payer: Commercial Managed Care - PPO | Attending: Hematology

## 2018-12-22 ENCOUNTER — Ambulatory Visit (HOSPITAL_COMMUNITY): Payer: Commercial Managed Care - PPO

## 2018-12-22 ENCOUNTER — Inpatient Hospital Stay (HOSPITAL_BASED_OUTPATIENT_CLINIC_OR_DEPARTMENT_OTHER): Payer: Commercial Managed Care - PPO | Admitting: Hematology

## 2018-12-22 ENCOUNTER — Encounter (HOSPITAL_COMMUNITY): Payer: Self-pay | Admitting: Hematology

## 2018-12-22 ENCOUNTER — Inpatient Hospital Stay (HOSPITAL_COMMUNITY): Payer: Commercial Managed Care - PPO

## 2018-12-22 ENCOUNTER — Other Ambulatory Visit: Payer: Self-pay

## 2018-12-22 DIAGNOSIS — E785 Hyperlipidemia, unspecified: Secondary | ICD-10-CM | POA: Insufficient documentation

## 2018-12-22 DIAGNOSIS — Z7982 Long term (current) use of aspirin: Secondary | ICD-10-CM | POA: Diagnosis not present

## 2018-12-22 DIAGNOSIS — E538 Deficiency of other specified B group vitamins: Secondary | ICD-10-CM

## 2018-12-22 DIAGNOSIS — M1990 Unspecified osteoarthritis, unspecified site: Secondary | ICD-10-CM | POA: Insufficient documentation

## 2018-12-22 DIAGNOSIS — F17210 Nicotine dependence, cigarettes, uncomplicated: Secondary | ICD-10-CM

## 2018-12-22 DIAGNOSIS — Z79899 Other long term (current) drug therapy: Secondary | ICD-10-CM

## 2018-12-22 DIAGNOSIS — I739 Peripheral vascular disease, unspecified: Secondary | ICD-10-CM | POA: Insufficient documentation

## 2018-12-22 DIAGNOSIS — Z122 Encounter for screening for malignant neoplasm of respiratory organs: Secondary | ICD-10-CM

## 2018-12-22 DIAGNOSIS — R5383 Other fatigue: Secondary | ICD-10-CM | POA: Diagnosis not present

## 2018-12-22 DIAGNOSIS — E611 Iron deficiency: Secondary | ICD-10-CM

## 2018-12-22 DIAGNOSIS — N189 Chronic kidney disease, unspecified: Secondary | ICD-10-CM

## 2018-12-22 DIAGNOSIS — I129 Hypertensive chronic kidney disease with stage 1 through stage 4 chronic kidney disease, or unspecified chronic kidney disease: Secondary | ICD-10-CM

## 2018-12-22 DIAGNOSIS — K219 Gastro-esophageal reflux disease without esophagitis: Secondary | ICD-10-CM

## 2018-12-22 LAB — COMPREHENSIVE METABOLIC PANEL
ALT: 21 U/L (ref 0–44)
AST: 19 U/L (ref 15–41)
Albumin: 4.3 g/dL (ref 3.5–5.0)
Alkaline Phosphatase: 94 U/L (ref 38–126)
Anion gap: 12 (ref 5–15)
BUN: 16 mg/dL (ref 6–20)
CO2: 21 mmol/L — ABNORMAL LOW (ref 22–32)
Calcium: 9.7 mg/dL (ref 8.9–10.3)
Chloride: 106 mmol/L (ref 98–111)
Creatinine, Ser: 1.19 mg/dL — ABNORMAL HIGH (ref 0.44–1.00)
GFR calc Af Amer: 59 mL/min — ABNORMAL LOW (ref >60)
GFR calc non Af Amer: 51 mL/min — ABNORMAL LOW (ref >60)
Glucose, Bld: 118 mg/dL — ABNORMAL HIGH (ref 70–99)
Potassium: 4.4 mmol/L (ref 3.5–5.1)
Sodium: 139 mmol/L (ref 135–145)
Total Bilirubin: 0.5 mg/dL (ref 0.3–1.2)
Total Protein: 7.7 g/dL (ref 6.5–8.1)

## 2018-12-22 LAB — CBC WITH DIFFERENTIAL/PLATELET
Abs Immature Granulocytes: 0.01 10*3/uL (ref 0.00–0.07)
Basophils Absolute: 0.1 10*3/uL (ref 0.0–0.1)
Basophils Relative: 1 %
Eosinophils Absolute: 0.2 10*3/uL (ref 0.0–0.5)
Eosinophils Relative: 3 %
HCT: 45.9 % (ref 36.0–46.0)
Hemoglobin: 14.9 g/dL (ref 12.0–15.0)
Immature Granulocytes: 0 %
Lymphocytes Relative: 23 %
Lymphs Abs: 1.3 10*3/uL (ref 0.7–4.0)
MCH: 33 pg (ref 26.0–34.0)
MCHC: 32.5 g/dL (ref 30.0–36.0)
MCV: 101.5 fL — ABNORMAL HIGH (ref 80.0–100.0)
Monocytes Absolute: 0.3 10*3/uL (ref 0.1–1.0)
Monocytes Relative: 5 %
Neutro Abs: 3.7 10*3/uL (ref 1.7–7.7)
Neutrophils Relative %: 68 %
Platelets: 185 10*3/uL (ref 150–400)
RBC: 4.52 MIL/uL (ref 3.87–5.11)
RDW: 13.8 % (ref 11.5–15.5)
WBC: 5.5 10*3/uL (ref 4.0–10.5)
nRBC: 0 % (ref 0.0–0.2)

## 2018-12-22 LAB — IRON AND TIBC
Iron: 90 ug/dL (ref 28–170)
Saturation Ratios: 25 % (ref 10.4–31.8)
TIBC: 361 ug/dL (ref 250–450)
UIBC: 271 ug/dL

## 2018-12-22 LAB — FERRITIN: Ferritin: 88 ng/mL (ref 11–307)

## 2018-12-22 LAB — VITAMIN B12: Vitamin B-12: 1754 pg/mL — ABNORMAL HIGH (ref 180–914)

## 2018-12-22 LAB — FOLATE: Folate: 6.4 ng/mL (ref >5.9)

## 2018-12-22 MED ORDER — CYANOCOBALAMIN 1000 MCG/ML IJ SOLN
1000.0000 ug | Freq: Once | INTRAMUSCULAR | Status: AC
  Administered 2018-12-22: 1000 ug via INTRAMUSCULAR

## 2018-12-22 MED ORDER — CYANOCOBALAMIN 1000 MCG/ML IJ SOLN: 1000.0000 ug | Freq: Once | INTRAMUSCULAR | 0 refills | Status: AC

## 2018-12-22 MED ORDER — CYANOCOBALAMIN 1000 MCG/ML IJ SOLN
INTRAMUSCULAR | Status: AC
  Filled 2018-12-22: qty 1

## 2018-12-22 NOTE — Assessment & Plan Note (Signed)
1.  Iron deficiency state: - Has received intermittent parenteral iron therapy from 2015 through 2018, last Feraheme on 05/01/2018. - Denies any bleeding per rectum or melena.  Started to feel tired lately. -I have reviewed her blood counts which showed hemoglobin of 14.9 with an MCV of 101.5.  Ferritin and iron panel is still pending.  - She also has underlying chronic kidney disease which can also make her hemoglobin worse.  Last creatinine was 1.19. - Recommend to resume monthly Vb12 injections. Plan to administer IV feraheme x 1 dose. Repeat low dose chest CT in Sept. - RTC in 4 mths.   2.  B12 deficiency: -Resume  injections.   3.  Smoking history: - She smokes 1 pack/day for more than 50 years. -I have reviewed results of the CT of the chest dated 04/13/2018 shows lung-RADS 2. -Repeat CT chest- low dose in Sept 2020.  4.  Health maintenance: -I have reviewed the results of the mammogram dated 11/21/2017 which was BI-RADS 1.

## 2018-12-22 NOTE — Progress Notes (Signed)
[place], [place] [address]   CLINIC:  Medical Oncology/Hematology  PCP:  Celene Squibb, MD [place] [address] [phone]   REASON FOR VISIT:  Follow-up for Iron Deficiency State  CURRENT THERAPY: Periodic Iron infusions   INTERVAL HISTORY:  Connie Farrell 58 y.o. female presents today for follow up. Reports overall doing well. Reports moderate fatigue. States she feels like her iron is low. She has not been able to receive her monthly Vb12 injections due to Covid virus restrictions. She denies any obvious signs of bleeding. Denies any lightheadedness or dizziness. No chest pain or SOB. No change in bowel habits or weight loss. No fevers, chills, or night sweats. She continues to smoke 1 pack of cigarettes daily.      REVIEW OF SYSTEMS:  Review of Systems  Constitutional: Positive for fatigue.  HENT:  Negative.   Eyes: Negative.   Respiratory: Negative.   Cardiovascular: Negative.   Gastrointestinal: Negative.   Endocrine: Negative.   Genitourinary: Negative.    Musculoskeletal: Negative.   Skin: Negative.   Neurological: Negative.   Hematological: Negative.   Psychiatric/Behavioral: Negative.      PAST MEDICAL/SURGICAL HISTORY:  Past Medical History:  Diagnosis Date  . Anxiety   . Arthritis    "hands; spine too" (04/04/2012)  . Exertional dyspnea   . Folate deficiency 10/17/2015  . GERD (gastroesophageal reflux disease)   . Hyperlipemia   . Hypertension   . Macrocytosis 10/17/2015  . Neck pain, chronic   . PAD (peripheral artery disease) ([place])   . Renal artery stenosis ([place])   . Seasonal allergies   . Tobacco abuse    Past Surgical History:  Procedure Laterality Date  . ABDOMINAL ANGIOGRAM  04/04/2012   Procedure: ABDOMINAL ANGIOGRAM;  Surgeon: Lorretta Harp, MD;  Location: [facility] CATH LAB;  Service: Cardiovascular;;  . ANTERIOR CERVICAL DECOMP/DISCECTOMY FUSION  ~ 04/2006   C6, 7, T1  .  COLONOSCOPY N/A 02/26/2014   Adequate preparation. Minimal anal canal hemorrhoids;otherwise, normal rectumdiminutive polyps in the middescending segment; otherwise, the remainder of colonic mucosaappeared normal. Hyperplastic polyps.  . ESOPHAGOGASTRODUODENOSCOPY N/A 02/26/2014   [id number] distal esophagus, query short segment Barrett's. Status post Venia Minks dilation with esophageal biopsy. Abnormal gastric mucosa. Esophageal biopsy negative for Barrett's. Gastric biopsy showed mild chronic gastritis, no H pylori.  Marland Kitchen LAPAROSCOPIC APPENDECTOMY N/A 09/10/2013   Procedure: APPENDECTOMY LAPAROSCOPIC;  Surgeon: Jamesetta So, MD;  Location: [facility] ORS;  Service: General;  Laterality: N/A;  . LOWER EXTREMITY ANGIOGRAM N/A 04/04/2012   Procedure: LOWER EXTREMITY ANGIOGRAM;  Surgeon: Lorretta Harp, MD;  Location: [facility] CATH LAB;  Service: Cardiovascular;  Laterality: N/A;  . Venia Minks DILATION N/A 02/26/2014   Procedure: Venia Minks DILATION;  Surgeon: Daneil Dolin, MD;  Location: [facility] ENDO SUITE;  Service: Endoscopy;  Laterality: N/A;  . PERIPHERAL ARTERIAL STENT GRAFT  04/04/2012  . RENAL ANGIOGRAM Left 04/22/2014   Procedure: RENAL ANGIOGRAM;  Surgeon: Lorretta Harp, MD;  Location: [facility] CATH LAB;  Service: Cardiovascular;  Laterality: Left;  . TUBAL LIGATION  1990     SOCIAL HISTORY:  Social History   Socioeconomic History  . Marital status: Married    Spouse name: Not on file  . Number of children: Not on file  . Years of education: Not on file  . Highest education level: Not on file  Occupational History  . 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 Financial resource strain: Not on file  . Food insecurity:    Worry: Not on file    Inability: Not on file  . Transportation needs:    Medical: Not on file    Non-medical: Not on file  Tobacco Use  . Smoking status: Current Every Day Smoker    Packs/day: 0.75    Years: 36.00    Pack years:  27.00    Types: Cigarettes  . Smokeless tobacco: Never Used  Substance and Sexual Activity  . Alcohol use: No  . Drug use: No  . Sexual activity: Never  Lifestyle  . Physical activity:    Days per week: Not on file    Minutes per session: Not on file  . Stress: Not on file  Relationships  . Social connections:    Talks on phone: Not on file    Gets together: Not on file    Attends religious service: Not on file    Active member of club or organization: Not on file    Attends meetings of clubs or organizations: Not on file    Relationship status: Not on file  . Intimate partner violence:    Fear of current or ex partner: Not on file    Emotionally abused: Not on file    Physically abused: Not on file    Forced sexual activity: Not on file  Other Topics Concern  . Not on file  Social History Narrative  . Not on file    FAMILY HISTORY:  Family History  Problem Relation Age of Onset  . Leukemia Father   . Cancer Father   . Heart disease Father   . Heart disease Mother   . Heart disease Other   . Arthritis Other   . Asthma Other   . Colon cancer Other        unknown, possibly father    CURRENT MEDICATIONS:  Outpatient Encounter Medications as of 12/22/2018  Medication Sig  . aspirin EC 81 MG tablet Take 81 mg by mouth daily.  . clopidogrel (PLAVIX) 75 MG tablet TAKE 1 TABLET DAILY. NEEDS  OFFICE VISIT.  Marland Kitchen cyclobenzaprine (FLEXERIL) 10 MG tablet Take 5-10 mg by mouth 3 (three) times daily as needed for muscle spasms.   Marland Kitchen dexlansoprazole (DEXILANT) 60 MG capsule TAKE 1 CAPSULE ONCE DAILY  . escitalopram (LEXAPRO) 10 MG tablet Take 10 mg by mouth Daily.  . fenofibrate 160 MG tablet   . folic acid (FOLVITE) 1 MG tablet Take 1 tablet (1 mg total) by mouth daily.  . metoprolol tartrate (LOPRESSOR) 25 MG tablet Take 0.5 tablets (12.5 mg total) by mouth 2 (two) times daily as needed.  . rosuvastatin (CRESTOR) 20 MG tablet   . vitamin B-12 (CYANOCOBALAMIN) 1000 MCG tablet  Take 1 tablet (1,000 mcg total) by mouth daily.   No facility-administered encounter medications on file as of 12/22/2018.     ALLERGIES:  Allergies  Allergen Reactions  . Latex Rash    Blisters, rash  . Codeine Nausea And Vomiting     PHYSICAL EXAM:  ECOG Performance status: 1  Vitals:   12/22/18 1300  BP: (!) 142/80  Pulse: 63  Resp: 16  Temp: 98.1 F (36.7 C)  SpO2: 97%   Filed Weights   12/22/18 1300  Weight: 151 lb 8 oz (68.7 kg)    Physical Exam Constitutional:      Appearance: Normal appearance. She is  obese.  HENT:     Head: Normocephalic.     Nose: Nose normal.     Mouth/Throat:     Mouth: Mucous membranes are moist.     Pharynx: Oropharynx is clear.  Eyes:     Extraocular Movements: Extraocular movements intact.     Conjunctiva/sclera: Conjunctivae normal.  Cardiovascular:     Rate and Rhythm: Normal rate and regular rhythm.     Pulses: Normal pulses.     Heart sounds: Normal heart sounds.  Pulmonary:     Effort: Pulmonary effort is normal.     Breath sounds: Normal breath sounds.  Abdominal:     General: Bowel sounds are normal.     Palpations: Abdomen is soft.  Musculoskeletal: Normal range of motion.  Skin:    General: Skin is warm.  Neurological:     General: No focal deficit present.     Mental Status: She is alert.  Psychiatric:        Mood and Affect: Mood normal.        Behavior: Behavior normal.        Thought Content: Thought content normal.        Judgment: Judgment normal.      LABORATORY DATA:  I have reviewed the labs as listed.  CBC    Component Value Date/Time   WBC 5.5 12/22/2018 1236   RBC 4.52 12/22/2018 1236   HGB 14.9 12/22/2018 1236   HCT 45.9 12/22/2018 1236   PLT 185 12/22/2018 1236   MCV 101.5 (H) 12/22/2018 1236   MCH 33.0 12/22/2018 1236   MCHC 32.5 12/22/2018 1236   RDW 13.8 12/22/2018 1236   LYMPHSABS 1.3 12/22/2018 1236   MONOABS 0.3 12/22/2018 1236   EOSABS 0.2 12/22/2018 1236   BASOSABS 0.1  12/22/2018 1236   CMP Latest Ref Rng & Units 12/22/2018 04/25/2018 04/21/2017  Glucose 70 - 99 mg/dL 118(H) 130(H) 95  BUN 6 - 20 mg/dL 16 21(H) 22(H)  Creatinine 0.44 - 1.00 mg/dL 1.19(H) 1.07(H) 1.46(H)  Sodium 135 - 145 mmol/L 139 139 138  Potassium 3.5 - 5.1 mmol/L 4.4 4.1 4.8  Chloride 98 - 111 mmol/L 106 110 105  CO2 22 - 32 mmol/L 21(L) 20(L) 23  Calcium 8.9 - 10.3 mg/dL 9.7 9.1 10.0  Total Protein 6.5 - 8.1 g/dL 7.7 - -  Total Bilirubin 0.3 - 1.2 mg/dL 0.5 - -  Alkaline Phos 38 - 126 U/L 94 - -  AST 15 - 41 U/L 19 - -  ALT 0 - 44 U/L 21 - -       ASSESSMENT & PLAN:   Iron deficiency 1.  Iron deficiency state: - Has received intermittent parenteral iron therapy from 2015 through 2018, last Feraheme on 05/01/2018. - Denies any bleeding per rectum or melena.  Started to feel tired lately. -I have reviewed her blood counts which showed hemoglobin of 14.9 with an MCV of 101.5.  Ferritin and iron panel is still pending.  - She also has underlying chronic kidney disease which can also make her hemoglobin worse.  Last creatinine was 1.19. - Recommend to resume monthly Vb12 injections. Plan to administer IV feraheme x 1 dose. Repeat low dose chest CT in Sept. - RTC in 4 mths.   2.  B12 deficiency: -Resume  injections.   3.  Smoking history: - She smokes 1 pack/day for more than 50 years. -I have reviewed results of the CT of the chest dated 04/13/2018 shows lung-RADS 2. -Repeat  CT chest- low dose in Sept 2020.  4.  Health maintenance: -I have reviewed the results of the mammogram dated 11/21/2017 which was BI-RADS 1.      Orders placed this encounter:  Orders Placed This Encounter  Procedures  . CT CHEST LUNG CA SCREEN LOW DOSE W/O CM      Roger Shelter, [place] [phone]

## 2018-12-22 NOTE — Patient Instructions (Signed)
[facility] at [facility]  Discharge Instructions:   _______________________________________________________________  Thank you for choosing [facility] at [facility] to provide your oncology and hematology care.  To afford each patient quality time with our providers, please arrive at least 15 minutes before your scheduled appointment.  You need to re-schedule your appointment if you arrive 10 or more minutes late.  We strive to give you quality time with our providers, and arriving late affects you and other patients whose appointments are after yours.  Also, if you no show three or more times for appointments you may be dismissed from the clinic.  Again, thank you for choosing [facility] at [facility]. Our hope is that these requests will allow you access to exceptional care and in a timely manner. _______________________________________________________________  If you have questions after your visit, please contact our office at [phone] between the hours of 8:30 a.m. and 5:00 p.m. Voicemails left after 4:30 p.m. will not be returned until the following business day. _______________________________________________________________  For prescription refill requests, have your pharmacy contact our office. _______________________________________________________________  Recommendations made by the consultant and any test results will be sent to your referring physician. _______________________________________________________________ 

## 2018-12-27 ENCOUNTER — Other Ambulatory Visit: Payer: Self-pay

## 2018-12-28 ENCOUNTER — Inpatient Hospital Stay (HOSPITAL_COMMUNITY): Payer: Commercial Managed Care - PPO

## 2018-12-28 VITALS — BP 145/74 | HR 58 | Temp 98.2°F | Resp 18

## 2018-12-28 DIAGNOSIS — E611 Iron deficiency: Secondary | ICD-10-CM

## 2018-12-28 MED ORDER — SODIUM CHLORIDE 0.9 % IV SOLN
INTRAVENOUS | Status: DC
  Administered 2018-12-28: 15:00:00 via INTRAVENOUS

## 2018-12-28 MED ORDER — FERUMOXYTOL INJECTION 510 MG/17 ML
510.0000 mg | Freq: Once | INTRAVENOUS | Status: AC
  Administered 2018-12-28: 510 mg via INTRAVENOUS
  Filled 2018-12-28: qty 17

## 2018-12-28 MED ORDER — SODIUM CHLORIDE 0.9 % IV SOLN
510.0000 mg | Freq: Once | INTRAVENOUS | Status: DC
  Filled 2018-12-28: qty 17

## 2018-12-28 NOTE — Patient Instructions (Signed)
[facility] at [facility]  Discharge Instructions:   _______________________________________________________________  Thank you for choosing [facility] at [facility] to provide your oncology and hematology care.  To afford each patient quality time with our providers, please arrive at least 15 minutes before your scheduled appointment.  You need to re-schedule your appointment if you arrive 10 or more minutes late.  We strive to give you quality time with our providers, and arriving late affects you and other patients whose appointments are after yours.  Also, if you no show three or more times for appointments you may be dismissed from the clinic.  Again, thank you for choosing [facility] at [facility]. Our hope is that these requests will allow you access to exceptional care and in a timely manner. _______________________________________________________________  If you have questions after your visit, please contact our office at [phone] between the hours of 8:30 a.m. and 5:00 p.m. Voicemails left after 4:30 p.m. will not be returned until the following business day. _______________________________________________________________  For prescription refill requests, have your pharmacy contact our office. _______________________________________________________________  Recommendations made by the consultant and any test results will be sent to your referring physician. _______________________________________________________________ 

## 2018-12-28 NOTE — Progress Notes (Signed)
Feraheme given per orders. Patient tolerated it well without problems. Vitals stable and discharged home from clinic ambulatory. Follow up as scheduled.  

## 2018-12-29 ENCOUNTER — Other Ambulatory Visit: Payer: Self-pay | Admitting: Cardiovascular Disease

## 2019-01-22 ENCOUNTER — Other Ambulatory Visit: Payer: Self-pay

## 2019-01-22 ENCOUNTER — Inpatient Hospital Stay (HOSPITAL_COMMUNITY): Payer: Commercial Managed Care - PPO | Attending: Hematology

## 2019-01-22 VITALS — BP 129/82 | HR 69 | Temp 98.0°F | Resp 18

## 2019-01-22 DIAGNOSIS — E538 Deficiency of other specified B group vitamins: Secondary | ICD-10-CM | POA: Diagnosis present

## 2019-01-22 DIAGNOSIS — E611 Iron deficiency: Secondary | ICD-10-CM

## 2019-01-22 MED ORDER — CYANOCOBALAMIN 1000 MCG/ML IJ SOLN
1000.0000 ug | Freq: Once | INTRAMUSCULAR | Status: AC
  Administered 2019-01-22: 1000 ug via INTRAMUSCULAR

## 2019-01-22 MED ORDER — CYANOCOBALAMIN 1000 MCG/ML IJ SOLN
INTRAMUSCULAR | Status: AC
  Filled 2019-01-22: qty 1

## 2019-01-22 MED ORDER — CYANOCOBALAMIN 1000 MCG/ML IJ SOLN: 1000.0000 ug | Freq: Once | INTRAMUSCULAR | 0 refills | Status: AC

## 2019-01-22 NOTE — Patient Instructions (Signed)
[place] at [facility]  Discharge Instructions:  b12 injection received today _______________________________________________________________  Thank you for choosing [place] at [facility] to provide your oncology and hematology care.  To afford each patient quality time with our providers, please arrive at least 15 minutes before your scheduled appointment.  You need to re-schedule your appointment if you arrive 10 or more minutes late.  We strive to give you quality time with our providers, and arriving late affects you and other patients whose appointments are after yours.  Also, if you no show three or more times for appointments you may be dismissed from the clinic.  Again, thank you for choosing [facility] hope is that these requests will allow you access to exceptional care and in a timely manner. _______________________________________________________________  If you have questions after your visit, please contact our office at [phone] between the hours of 8:30 a.m. and 5:00 p.m. Voicemails left after 4:30 p.m. will not be returned until the following business day. _______________________________________________________________  For prescription refill requests, have your pharmacy contact our office. _______________________________________________________________  Recommendations made by the consultant and any test results will be sent to your referring physician. _______________________________________________________________

## 2019-01-22 NOTE — Progress Notes (Signed)
Connie Farrell presents today for injection per the provider's orders.  B12 administered without incident; see MAR for injection details.  Patient tolerated procedure well and without incident.  No questions or complaints noted at this time.

## 2019-02-22 ENCOUNTER — Other Ambulatory Visit: Payer: Self-pay

## 2019-02-22 ENCOUNTER — Inpatient Hospital Stay (HOSPITAL_COMMUNITY): Payer: Commercial Managed Care - PPO | Attending: Hematology

## 2019-02-22 VITALS — BP 154/89 | HR 71 | Temp 97.4°F | Resp 16

## 2019-02-22 DIAGNOSIS — E611 Iron deficiency: Secondary | ICD-10-CM

## 2019-02-22 DIAGNOSIS — E538 Deficiency of other specified B group vitamins: Secondary | ICD-10-CM | POA: Insufficient documentation

## 2019-02-22 MED ORDER — CYANOCOBALAMIN 1000 MCG/ML IJ SOLN: 1000.0000 ug | Freq: Once | INTRAMUSCULAR | 0 refills | Status: AC

## 2019-02-22 MED ORDER — CYANOCOBALAMIN 1000 MCG/ML IJ SOLN
1000.0000 ug | Freq: Once | INTRAMUSCULAR | Status: AC
  Administered 2019-02-22: 1000 ug via INTRAMUSCULAR
  Filled 2019-02-22: qty 1

## 2019-02-22 NOTE — Progress Notes (Signed)
Connie Farrell presents today for B12 injection. VSS. Injection tolerated without incident or complaint. See MAR for details. Patient discharged in satisfactory condition with follow up instructions.

## 2019-02-22 NOTE — Patient Instructions (Signed)
[facility] at [facility]  Discharge Instructions:  B12 injection received today.  Cyanocobalamin, Vitamin B12 injection What is this medicine? CYANOCOBALAMIN (sye an oh koe BAL a min) is a man made form of vitamin B12. Vitamin B12 is used in the growth of healthy blood cells, nerve cells, and proteins in the body. It also helps with the metabolism of fats and carbohydrates. This medicine is used to treat people who can not absorb vitamin B12. This medicine may be used for other purposes; ask your health care provider or pharmacist if you have questions. COMMON BRAND NAME(S): B-12 Compliance Kit, B-12 Injection Kit, Cyomin, LA-12, Nutri-Twelve, Physicians EZ Use B-12, Primabalt What should I tell my health care provider before I take this medicine? They need to know if you have any of these conditions:  kidney disease  Leber's disease  megaloblastic anemia  an unusual or allergic reaction to cyanocobalamin, cobalt, other medicines, foods, dyes, or preservatives  pregnant or trying to get pregnant  breast-feeding How should I use this medicine? This medicine is injected into a muscle or deeply under the skin. It is usually given by a health care professional in a clinic or doctor's office. However, your doctor may teach you how to inject yourself. Follow all instructions. Talk to your pediatrician regarding the use of this medicine in children. Special care may be needed. Overdosage: If you think you have taken too much of this medicine contact a poison control center or emergency room at once. NOTE: This medicine is only for you. Do not share this medicine with others. What if I miss a dose? If you are given your dose at a clinic or doctor's office, call to reschedule your appointment. If you give your own injections and you miss a dose, take it as soon as you can. If it is almost time for your next dose, take only that dose. Do not take double or extra  doses. What may interact with this medicine?  colchicine  heavy alcohol intake This list may not describe all possible interactions. Give your health care provider a list of all the medicines, herbs, non-prescription drugs, or dietary supplements you use. Also tell them if you smoke, drink alcohol, or use illegal drugs. Some items may interact with your medicine. What should I watch for while using this medicine? Visit your doctor or health care professional regularly. You may need blood work done while you are taking this medicine. You may need to follow a special diet. Talk to your doctor. Limit your alcohol intake and avoid smoking to get the best benefit. What side effects may I notice from receiving this medicine? Side effects that you should report to your doctor or health care professional as soon as possible:  allergic reactions like skin rash, itching or hives, swelling of the face, lips, or tongue  blue tint to skin  chest tightness, pain  difficulty breathing, wheezing  dizziness  red, swollen painful area on the leg Side effects that usually do not require medical attention (report to your doctor or health care professional if they continue or are bothersome):  diarrhea  headache This list may not describe all possible side effects. Call your doctor for medical advice about side effects. You may report side effects to FDA at 1-800-FDA-1088. Where should I keep my medicine? Keep out of the reach of children. Store at room temperature between 15 and 30 degrees C (59 and 85 degrees F). Protect from light. Throw away   any unused medicine after the expiration date. NOTE: This sheet is a summary. It may not cover all possible information. If you have questions about this medicine, talk to your doctor, pharmacist, or health care provider.  2020 Elsevier/Gold Standard (2007-10-16 22:10:20)  _______________________________________________________________  Thank you for  choosing [facility] at [facility] to provide your oncology and hematology care.  To afford each patient quality time with our providers, please arrive at least 15 minutes before your scheduled appointment.  You need to re-schedule your appointment if you arrive 10 or more minutes late.  We strive to give you quality time with our providers, and arriving late affects you and other patients whose appointments are after yours.  Also, if you no show three or more times for appointments you may be dismissed from the clinic.  Again, thank you for choosing [facility] at [facility]. Our hope is that these requests will allow you access to exceptional care and in a timely manner. _______________________________________________________________  If you have questions after your visit, please contact our office at [phone] between the hours of 8:30 a.m. and 5:00 p.m. Voicemails left after 4:30 p.m. will not be returned until the following business day. _______________________________________________________________  For prescription refill requests, have your pharmacy contact our office. _______________________________________________________________  Recommendations made by the consultant and any test results will be sent to your referring physician. _______________________________________________________________ 

## 2019-03-08 ENCOUNTER — Encounter (HOSPITAL_COMMUNITY): Payer: Self-pay

## 2019-03-22 DIAGNOSIS — E538 Deficiency of other specified B group vitamins: Secondary | ICD-10-CM | POA: Insufficient documentation

## 2019-03-23 ENCOUNTER — Other Ambulatory Visit: Payer: Self-pay

## 2019-03-23 ENCOUNTER — Encounter (HOSPITAL_COMMUNITY): Payer: Self-pay

## 2019-03-23 ENCOUNTER — Inpatient Hospital Stay (HOSPITAL_COMMUNITY): Payer: Commercial Managed Care - PPO | Attending: Hematology

## 2019-03-23 VITALS — BP 142/88 | HR 68 | Temp 97.7°F | Resp 16

## 2019-03-23 DIAGNOSIS — E538 Deficiency of other specified B group vitamins: Secondary | ICD-10-CM

## 2019-03-23 DIAGNOSIS — E611 Iron deficiency: Secondary | ICD-10-CM

## 2019-03-23 DIAGNOSIS — D509 Iron deficiency anemia, unspecified: Secondary | ICD-10-CM | POA: Diagnosis not present

## 2019-03-23 MED ORDER — CYANOCOBALAMIN 1000 MCG/ML IJ SOLN
1000.0000 ug | Freq: Once | INTRAMUSCULAR | Status: AC
  Administered 2019-03-23: 1000 ug via INTRAMUSCULAR

## 2019-03-23 MED ORDER — CYANOCOBALAMIN 1000 MCG/ML IJ SOLN
INTRAMUSCULAR | Status: AC
  Filled 2019-03-23: qty 1

## 2019-03-23 NOTE — Progress Notes (Signed)
Patient tolerated injection with no complaints voiced.  Site clean and dry with no bruising or swelling noted at site.  Band aid applied.  Vss with discharge and left ambulatory with no s/s of distress noted.  

## 2019-03-25 ENCOUNTER — Other Ambulatory Visit: Payer: Self-pay | Admitting: Cardiovascular Disease

## 2019-03-28 ENCOUNTER — Ambulatory Visit (HOSPITAL_COMMUNITY)
Admission: RE | Admit: 2019-03-28 | Discharge: 2019-03-28 | Disposition: A | Discharge status: Home / Self Care | Payer: Commercial Managed Care - PPO | Source: Ambulatory Visit | Attending: Hematology | Admitting: Hematology

## 2019-03-28 ENCOUNTER — Other Ambulatory Visit: Payer: Self-pay

## 2019-03-28 DIAGNOSIS — Z122 Encounter for screening for malignant neoplasm of respiratory organs: Secondary | ICD-10-CM | POA: Diagnosis present

## 2019-03-29 ENCOUNTER — Other Ambulatory Visit (HOSPITAL_COMMUNITY): Payer: Self-pay | Admitting: *Deleted

## 2019-03-29 DIAGNOSIS — R911 Solitary pulmonary nodule: Secondary | ICD-10-CM

## 2019-04-09 ENCOUNTER — Encounter (HOSPITAL_COMMUNITY): Payer: Self-pay | Admitting: *Deleted

## 2019-04-16 ENCOUNTER — Ambulatory Visit (HOSPITAL_COMMUNITY): Admission: RE | Admit: 2019-04-16 | Payer: Commercial Managed Care - PPO | Source: Ambulatory Visit

## 2019-04-19 ENCOUNTER — Ambulatory Visit (HOSPITAL_BASED_OUTPATIENT_CLINIC_OR_DEPARTMENT_OTHER)
Admission: RE | Admit: 2019-04-19 | Discharge: 2019-04-19 | Disposition: A | Discharge status: Home / Self Care | Payer: Commercial Managed Care - PPO | Source: Ambulatory Visit | Attending: Cardiology | Admitting: Cardiology

## 2019-04-19 ENCOUNTER — Other Ambulatory Visit: Payer: Self-pay | Admitting: Cardiovascular Disease

## 2019-04-19 ENCOUNTER — Ambulatory Visit (HOSPITAL_COMMUNITY)
Admission: RE | Admit: 2019-04-19 | Discharge: 2019-04-19 | Disposition: A | Discharge status: Home / Self Care | Payer: Commercial Managed Care - PPO | Source: Ambulatory Visit | Attending: Cardiology | Admitting: Cardiology

## 2019-04-19 ENCOUNTER — Other Ambulatory Visit: Payer: Self-pay

## 2019-04-19 DIAGNOSIS — I739 Peripheral vascular disease, unspecified: Secondary | ICD-10-CM | POA: Diagnosis present

## 2019-04-19 DIAGNOSIS — I701 Atherosclerosis of renal artery: Secondary | ICD-10-CM

## 2019-04-19 DIAGNOSIS — Z95828 Presence of other vascular implants and grafts: Secondary | ICD-10-CM

## 2019-04-20 ENCOUNTER — Other Ambulatory Visit (HOSPITAL_COMMUNITY): Payer: Commercial Managed Care - PPO

## 2019-04-20 ENCOUNTER — Inpatient Hospital Stay (HOSPITAL_COMMUNITY): Payer: Commercial Managed Care - PPO | Attending: Hematology

## 2019-04-20 ENCOUNTER — Ambulatory Visit (HOSPITAL_COMMUNITY): Payer: Commercial Managed Care - PPO | Admitting: Hematology

## 2019-04-20 ENCOUNTER — Encounter (HOSPITAL_COMMUNITY): Payer: Self-pay

## 2019-04-20 ENCOUNTER — Ambulatory Visit (HOSPITAL_COMMUNITY): Payer: Commercial Managed Care - PPO

## 2019-04-20 VITALS — BP 149/85 | HR 63 | Temp 97.6°F | Resp 18

## 2019-04-20 DIAGNOSIS — Z23 Encounter for immunization: Secondary | ICD-10-CM | POA: Insufficient documentation

## 2019-04-20 DIAGNOSIS — E538 Deficiency of other specified B group vitamins: Secondary | ICD-10-CM | POA: Diagnosis present

## 2019-04-20 DIAGNOSIS — E611 Iron deficiency: Secondary | ICD-10-CM

## 2019-04-20 MED ORDER — INFLUENZA VAC SPLIT QUAD 0.5 ML IM SUSY
PREFILLED_SYRINGE | INTRAMUSCULAR | Status: AC
  Filled 2019-04-20: qty 0.5

## 2019-04-20 MED ORDER — CYANOCOBALAMIN 1000 MCG/ML IJ SOLN
1000.0000 ug | Freq: Once | INTRAMUSCULAR | Status: AC
  Administered 2019-04-20: 1000 ug via INTRAMUSCULAR
  Filled 2019-04-20: qty 1

## 2019-04-20 MED ORDER — INFLUENZA VAC SPLIT QUAD 0.5 ML IM SUSY
0.5000 mL | PREFILLED_SYRINGE | Freq: Once | INTRAMUSCULAR | Status: AC
  Administered 2019-04-20: 0.5 mL via INTRAMUSCULAR

## 2019-04-20 NOTE — Progress Notes (Signed)
B12 injection done today per orders. Flu vaccine given per patient request. Patient tolerated it well without problems. Vitals stable and discharged home from clinic ambulatory. Follow up as scheduled.

## 2019-04-20 NOTE — Patient Instructions (Signed)
[facility] at [facility]  Discharge Instructions:   _______________________________________________________________  Thank you for choosing [facility] at [facility] to provide your oncology and hematology care.  To afford each patient quality time with our providers, please arrive at least 15 minutes before your scheduled appointment.  You need to re-schedule your appointment if you arrive 10 or more minutes late.  We strive to give you quality time with our providers, and arriving late affects you and other patients whose appointments are after yours.  Also, if you no show three or more times for appointments you may be dismissed from the clinic.  Again, thank you for choosing  [facility]. Our hope is that these requests will allow you access to exceptional care and in a timely manner. _______________________________________________________________  If you have questions after your visit, please contact our office at [phone] between the hours of 8:30 a.m. and 5:00 p.m. Voicemails left after 4:30 p.m. will not be returned until the following business day. _______________________________________________________________  For prescription refill requests, have your pharmacy contact our office. _______________________________________________________________  Recommendations made by the consultant and any test results will be sent to your referring physician. _______________________________________________________________ 

## 2019-04-23 ENCOUNTER — Other Ambulatory Visit: Payer: Self-pay | Admitting: *Deleted

## 2019-04-23 DIAGNOSIS — I701 Atherosclerosis of renal artery: Secondary | ICD-10-CM

## 2019-04-23 DIAGNOSIS — I739 Peripheral vascular disease, unspecified: Secondary | ICD-10-CM

## 2019-04-23 NOTE — Progress Notes (Signed)
v

## 2019-04-26 ENCOUNTER — Other Ambulatory Visit (HOSPITAL_COMMUNITY): Payer: Self-pay | Admitting: *Deleted

## 2019-04-26 DIAGNOSIS — R911 Solitary pulmonary nodule: Secondary | ICD-10-CM

## 2019-04-26 DIAGNOSIS — E611 Iron deficiency: Secondary | ICD-10-CM

## 2019-04-26 DIAGNOSIS — Z122 Encounter for screening for malignant neoplasm of respiratory organs: Secondary | ICD-10-CM

## 2019-04-26 NOTE — Progress Notes (Signed)
CT CHEST LCS NODULE FOLLOW-UP W/O CM

## 2019-04-30 ENCOUNTER — Other Ambulatory Visit (HOSPITAL_COMMUNITY): Payer: Self-pay | Admitting: Hematology

## 2019-04-30 DIAGNOSIS — K2100 Gastro-esophageal reflux disease with esophagitis, without bleeding: Secondary | ICD-10-CM

## 2019-05-01 ENCOUNTER — Encounter (HOSPITAL_COMMUNITY): Payer: Commercial Managed Care - PPO

## 2019-05-22 ENCOUNTER — Encounter (HOSPITAL_COMMUNITY): Payer: Self-pay

## 2019-05-22 ENCOUNTER — Other Ambulatory Visit: Payer: Self-pay

## 2019-05-22 ENCOUNTER — Other Ambulatory Visit: Payer: Self-pay | Admitting: Cardiovascular Disease

## 2019-05-22 ENCOUNTER — Inpatient Hospital Stay (HOSPITAL_COMMUNITY): Payer: Commercial Managed Care - PPO | Attending: Hematology

## 2019-05-22 VITALS — BP 163/91 | HR 65 | Temp 97.9°F | Resp 18

## 2019-05-22 DIAGNOSIS — E538 Deficiency of other specified B group vitamins: Secondary | ICD-10-CM

## 2019-05-22 DIAGNOSIS — D538 Other specified nutritional anemias: Secondary | ICD-10-CM | POA: Diagnosis not present

## 2019-05-22 DIAGNOSIS — E611 Iron deficiency: Secondary | ICD-10-CM

## 2019-05-22 MED ORDER — CYANOCOBALAMIN 1000 MCG/ML IJ SOLN
INTRAMUSCULAR | Status: AC
  Filled 2019-05-22: qty 1

## 2019-05-22 MED ORDER — CYANOCOBALAMIN 1000 MCG/ML IJ SOLN
1000.0000 ug | Freq: Once | INTRAMUSCULAR | Status: AC
  Administered 2019-05-22: 1000 ug via INTRAMUSCULAR

## 2019-05-22 NOTE — Patient Instructions (Signed)
[facility] at [facility] Discharge Instructions  Received Vit B12 injection today. Follow-up as scheduled. Call clinic for any questions or concerns   Thank you for choosing [facility] at [facility] to provide your oncology and hematology care.  To afford each patient quality time with our provider, please arrive at least 15 minutes before your scheduled appointment time.   If you have a lab appointment with the Cancer Center please come in thru the Main Entrance and check in at the main information desk.  You need to re-schedule your appointment should you arrive 10 or more minutes late.  We strive to give you quality time with our providers, and arriving late affects you and other patients whose appointments are after yours.  Also, if you no show three or more times for appointments you may be dismissed from the clinic at the providers discretion.     Again, thank you for choosing [facility].  Our hope is that these requests will decrease the amount of time that you wait before being seen by our physicians.       _____________________________________________________________  Should you have questions after your visit to  Cancer Center, please contact our office at [phone] between the hours of 8:00 a.m. and 4:30 p.m.  Voicemails left after 4:00 p.m. will not be returned until the following business day.  For prescription refill requests, have your pharmacy contact our office and allow 72 hours.    Due to Covid, you will need to wear a mask upon entering the hospital. If you do not have a mask, a mask will be given to you at the Main Entrance upon arrival. For doctor visits, patients may have 1 support person with them. For treatment visits, patients can not have anyone with them due to social distancing guidelines and our immunocompromised population.     

## 2019-05-22 NOTE — Progress Notes (Signed)
Anyela C Cooner tolerated Vit B12 injection well without complaints or incident. VSS Pt discharged self ambulatory in satisfactory condition

## 2019-05-22 NOTE — Telephone Encounter (Signed)
Rx request sent to pharmacy.  

## 2019-06-20 ENCOUNTER — Other Ambulatory Visit: Payer: Self-pay | Admitting: Cardiovascular Disease

## 2019-06-29 ENCOUNTER — Other Ambulatory Visit (HOSPITAL_COMMUNITY): Payer: Self-pay | Admitting: *Deleted

## 2019-06-29 DIAGNOSIS — E611 Iron deficiency: Secondary | ICD-10-CM

## 2019-06-29 DIAGNOSIS — E538 Deficiency of other specified B group vitamins: Secondary | ICD-10-CM

## 2019-07-02 ENCOUNTER — Ambulatory Visit (HOSPITAL_COMMUNITY)
Admission: RE | Admit: 2019-07-02 | Discharge: 2019-07-02 | Disposition: A | Discharge status: Home / Self Care | Payer: Commercial Managed Care - PPO | Source: Ambulatory Visit | Attending: Hematology | Admitting: Hematology

## 2019-07-02 ENCOUNTER — Other Ambulatory Visit: Payer: Self-pay

## 2019-07-02 ENCOUNTER — Inpatient Hospital Stay (HOSPITAL_COMMUNITY): Payer: Commercial Managed Care - PPO | Attending: Hematology

## 2019-07-02 DIAGNOSIS — R5383 Other fatigue: Secondary | ICD-10-CM | POA: Insufficient documentation

## 2019-07-02 DIAGNOSIS — R911 Solitary pulmonary nodule: Secondary | ICD-10-CM | POA: Diagnosis present

## 2019-07-02 DIAGNOSIS — D649 Anemia, unspecified: Secondary | ICD-10-CM | POA: Insufficient documentation

## 2019-07-02 DIAGNOSIS — E611 Iron deficiency: Secondary | ICD-10-CM | POA: Diagnosis not present

## 2019-07-02 DIAGNOSIS — M1990 Unspecified osteoarthritis, unspecified site: Secondary | ICD-10-CM | POA: Diagnosis not present

## 2019-07-02 DIAGNOSIS — I739 Peripheral vascular disease, unspecified: Secondary | ICD-10-CM | POA: Insufficient documentation

## 2019-07-02 DIAGNOSIS — E785 Hyperlipidemia, unspecified: Secondary | ICD-10-CM | POA: Diagnosis not present

## 2019-07-02 DIAGNOSIS — Z79899 Other long term (current) drug therapy: Secondary | ICD-10-CM | POA: Diagnosis not present

## 2019-07-02 DIAGNOSIS — E538 Deficiency of other specified B group vitamins: Secondary | ICD-10-CM

## 2019-07-02 DIAGNOSIS — Z7982 Long term (current) use of aspirin: Secondary | ICD-10-CM | POA: Diagnosis not present

## 2019-07-02 DIAGNOSIS — Z122 Encounter for screening for malignant neoplasm of respiratory organs: Secondary | ICD-10-CM

## 2019-07-02 DIAGNOSIS — F419 Anxiety disorder, unspecified: Secondary | ICD-10-CM | POA: Insufficient documentation

## 2019-07-02 DIAGNOSIS — I10 Essential (primary) hypertension: Secondary | ICD-10-CM | POA: Diagnosis not present

## 2019-07-02 DIAGNOSIS — K219 Gastro-esophageal reflux disease without esophagitis: Secondary | ICD-10-CM | POA: Diagnosis not present

## 2019-07-02 DIAGNOSIS — F17210 Nicotine dependence, cigarettes, uncomplicated: Secondary | ICD-10-CM | POA: Insufficient documentation

## 2019-07-02 LAB — COMPREHENSIVE METABOLIC PANEL
ALT: 16 U/L (ref 0–44)
AST: 15 U/L (ref 15–41)
Albumin: 4.1 g/dL (ref 3.5–5.0)
Alkaline Phosphatase: 89 U/L (ref 38–126)
Anion gap: 9 (ref 5–15)
BUN: 16 mg/dL (ref 6–20)
CO2: 20 mmol/L — ABNORMAL LOW (ref 22–32)
Calcium: 9.5 mg/dL (ref 8.9–10.3)
Chloride: 110 mmol/L (ref 98–111)
Creatinine, Ser: 1.1 mg/dL — ABNORMAL HIGH (ref 0.44–1.00)
GFR calc Af Amer: >60 mL/min (ref >60)
GFR calc non Af Amer: 56 mL/min — ABNORMAL LOW (ref >60)
Glucose, Bld: 124 mg/dL — ABNORMAL HIGH (ref 70–99)
Potassium: 4.2 mmol/L (ref 3.5–5.1)
Sodium: 139 mmol/L (ref 135–145)
Total Bilirubin: 0.3 mg/dL (ref 0.3–1.2)
Total Protein: 7.4 g/dL (ref 6.5–8.1)

## 2019-07-02 LAB — CBC WITH DIFFERENTIAL/PLATELET
Abs Immature Granulocytes: 0.02 K/uL (ref 0.00–0.07)
Basophils Absolute: 0.1 K/uL (ref 0.0–0.1)
Basophils Relative: 1 %
Eosinophils Absolute: 0.1 K/uL (ref 0.0–0.5)
Eosinophils Relative: 3 %
HCT: 47.7 % — ABNORMAL HIGH (ref 36.0–46.0)
Hemoglobin: 15.1 g/dL — ABNORMAL HIGH (ref 12.0–15.0)
Immature Granulocytes: 0 %
Lymphocytes Relative: 21 %
Lymphs Abs: 1.1 K/uL (ref 0.7–4.0)
MCH: 33.1 pg (ref 26.0–34.0)
MCHC: 31.7 g/dL (ref 30.0–36.0)
MCV: 104.6 fL — ABNORMAL HIGH (ref 80.0–100.0)
Monocytes Absolute: 0.2 K/uL (ref 0.1–1.0)
Monocytes Relative: 5 %
Neutro Abs: 3.8 K/uL (ref 1.7–7.7)
Neutrophils Relative %: 70 %
Platelets: 154 K/uL (ref 150–400)
RBC: 4.56 MIL/uL (ref 3.87–5.11)
RDW: 13.5 % (ref 11.5–15.5)
WBC: 5.4 K/uL (ref 4.0–10.5)
nRBC: 0 % (ref 0.0–0.2)

## 2019-07-02 LAB — IRON AND TIBC
Iron: 55 ug/dL (ref 28–170)
Saturation Ratios: 15 % (ref 10.4–31.8)
TIBC: 368 ug/dL (ref 250–450)
UIBC: 313 ug/dL

## 2019-07-02 LAB — FERRITIN: Ferritin: 114 ng/mL (ref 11–307)

## 2019-07-02 LAB — VITAMIN B12: Vitamin B-12: 1551 pg/mL — ABNORMAL HIGH (ref 180–914)

## 2019-07-02 LAB — FOLATE: Folate: 63.8 ng/mL (ref >5.9)

## 2019-07-03 ENCOUNTER — Other Ambulatory Visit (HOSPITAL_COMMUNITY): Payer: Self-pay | Admitting: *Deleted

## 2019-07-03 ENCOUNTER — Encounter (HOSPITAL_COMMUNITY): Payer: Self-pay | Admitting: *Deleted

## 2019-07-03 DIAGNOSIS — R911 Solitary pulmonary nodule: Secondary | ICD-10-CM

## 2019-07-03 NOTE — Progress Notes (Signed)
Orders placed for follow up CT scan in 3 months per Harriet Pho, NP

## 2019-07-03 NOTE — Progress Notes (Signed)
Patient has an appointment with our ordering provider on Thursday.  She will be notified of her LDCT lung cancer screening results at that time.  Patient was recommended via scan results to follow up in 12 months; however, our provider, Reynolds Bowl, NP wants to follow up in 3 months with another CT Chest LCS Nodule f/u wo contrast.  Orders placed and patient will be made aware of her appointments at next visit.    Impression:  1. Lung-RADS 2, benign appearance or behavior. Continue annual screening with low-dose chest CT without contrast in 12 months. 2. Aortic atherosclerosis. Three vessel coronary artery calcifications noted.  Aortic Atherosclerosis (ICD10-I70.0).

## 2019-07-05 ENCOUNTER — Inpatient Hospital Stay (HOSPITAL_BASED_OUTPATIENT_CLINIC_OR_DEPARTMENT_OTHER): Payer: Commercial Managed Care - PPO | Admitting: Hematology

## 2019-07-05 ENCOUNTER — Other Ambulatory Visit: Payer: Self-pay

## 2019-07-05 ENCOUNTER — Encounter (HOSPITAL_COMMUNITY): Payer: Self-pay | Admitting: Hematology

## 2019-07-05 ENCOUNTER — Inpatient Hospital Stay (HOSPITAL_COMMUNITY): Payer: Commercial Managed Care - PPO

## 2019-07-05 DIAGNOSIS — E611 Iron deficiency: Secondary | ICD-10-CM

## 2019-07-05 DIAGNOSIS — E538 Deficiency of other specified B group vitamins: Secondary | ICD-10-CM

## 2019-07-05 DIAGNOSIS — D649 Anemia, unspecified: Secondary | ICD-10-CM | POA: Diagnosis not present

## 2019-07-05 MED ORDER — CYANOCOBALAMIN 1000 MCG/ML IJ SOLN
INTRAMUSCULAR | Status: AC
  Filled 2019-07-05: qty 1

## 2019-07-05 MED ORDER — CYANOCOBALAMIN 1000 MCG/ML IJ SOLN: 1000.0000 ug | Freq: Once | INTRAMUSCULAR | Status: DC

## 2019-07-05 NOTE — Progress Notes (Signed)
[address], [place] [address]   CLINIC:  Medical Oncology/Hematology  PCP:  Celene Squibb, MD [place] [address] [phone]   REASON FOR VISIT:  Follow-up for Anemia   CURRENT THERAPY: Clinical surveillance    INTERVAL HISTORY:  Connie Farrell 58 y.o. female presents today for follow up. Reports overall doing well. She reports mild fatigue.  Denies any obvious signs of bleeding.  She denies any chest pain, shortness of breath, lightheadedness or dizziness.  Denies any change in bowel habits.  Appetite is stable.  No weight loss.  She is here for repeat labs and office visit.   REVIEW OF SYSTEMS:  Review of Systems  Constitutional: Negative.   HENT:  Negative.   Eyes: Negative.   Respiratory: Negative.   Cardiovascular: Negative.   Gastrointestinal: Negative.   Endocrine: Negative.   Genitourinary: Negative.    Musculoskeletal: Positive for arthralgias and myalgias.  Skin: Negative.   Neurological: Negative.   Hematological: Negative.   Psychiatric/Behavioral: Negative.      PAST MEDICAL/SURGICAL HISTORY:  Past Medical History:  Diagnosis Date  . Anxiety   . Arthritis    "hands; spine too" (04/04/2012)  . Exertional dyspnea   . Folate deficiency 10/17/2015  . GERD (gastroesophageal reflux disease)   . Hyperlipemia   . Hypertension   . Macrocytosis 10/17/2015  . Neck pain, chronic   . PAD (peripheral artery disease) ([place])   . Renal artery stenosis ([place])   . Seasonal allergies   . Tobacco abuse    Past Surgical History:  Procedure Laterality Date  . ABDOMINAL ANGIOGRAM  04/04/2012   Procedure: ABDOMINAL ANGIOGRAM;  Surgeon: Lorretta Harp, MD;  Location: [facility] CATH LAB;  Service: Cardiovascular;;  . ANTERIOR CERVICAL DECOMP/DISCECTOMY FUSION  ~ 04/2006   C6, 7, T1  . COLONOSCOPY N/A 02/26/2014   Adequate preparation. Minimal anal canal hemorrhoids;otherwise, normal rectumdiminutive polyps in the  middescending segment; otherwise, the remainder of colonic mucosaappeared normal. Hyperplastic polyps.  . ESOPHAGOGASTRODUODENOSCOPY N/A 02/26/2014   SPQ:[id number] distal esophagus, query short segment Barrett's. Status post Venia Minks dilation with esophageal biopsy. Abnormal gastric mucosa. Esophageal biopsy negative for Barrett's. Gastric biopsy showed mild chronic gastritis, no H pylori.  Marland Kitchen LAPAROSCOPIC APPENDECTOMY N/A 09/10/2013   Procedure: APPENDECTOMY LAPAROSCOPIC;  Surgeon: Jamesetta So, MD;  Location: [facility] ORS;  Service: General;  Laterality: N/A;  . LOWER EXTREMITY ANGIOGRAM N/A 04/04/2012   Procedure: LOWER EXTREMITY ANGIOGRAM;  Surgeon: Lorretta Harp, MD;  Location: [facility] CATH LAB;  Service: Cardiovascular;  Laterality: N/A;  . Venia Minks DILATION N/A 02/26/2014   Procedure: Venia Minks DILATION;  Surgeon: Daneil Dolin, MD;  Location: [facility] ENDO SUITE;  Service: Endoscopy;  Laterality: N/A;  . PERIPHERAL ARTERIAL STENT GRAFT  04/04/2012  . RENAL ANGIOGRAM Left 04/22/2014   Procedure: RENAL ANGIOGRAM;  Surgeon: Lorretta Harp, MD;  Location: [facility] CATH LAB;  Service: Cardiovascular;  Laterality: Left;  . TUBAL LIGATION  1990     SOCIAL HISTORY:  Social History   Socioeconomic History  . Marital status: Married    Spouse name: Not on file  . Number of children: Not on file  . Years of education: Not on file  . Highest education level: Not on file  Occupational History  . Occupation: Chiropodist: [employer]    Comment: [address]  Tobacco Use  . Smoking status: Current Every Day Smoker  Packs/day: 0.75    Years: 36.00    Pack years: 27.00    Types: Cigarettes  . Smokeless tobacco: Never Used  Substance and Sexual Activity  . Alcohol use: No  . Drug use: No  . Sexual activity: Never  Other Topics Concern  . Not on file  Social History Narrative  . Not on file   Social Determinants of Health   Financial Resource Strain:   . Difficulty of Paying Living  Expenses: Not on file  Food Insecurity:   . Worried About Charity fundraiser in the Last Year: Not on file  . Ran Out of Food in the Last Year: Not on file  Transportation Needs:   . Lack of Transportation (Medical): Not on file  . Lack of Transportation (Non-Medical): Not on file  Physical Activity:   . Days of Exercise per Week: Not on file  . Minutes of Exercise per Session: Not on file  Stress:   . Feeling of Stress : Not on file  Social Connections:   . Frequency of Communication with Friends and Family: Not on file  . Frequency of Social Gatherings with Friends and Family: Not on file  . Attends Religious Services: Not on file  . Active Member of Clubs or Organizations: Not on file  . Attends Archivist Meetings: Not on file  . Marital Status: Not on file  Intimate Partner Violence:   . Fear of Current or Ex-Partner: Not on file  . Emotionally Abused: Not on file  . Physically Abused: Not on file  . Sexually Abused: Not on file    FAMILY HISTORY:  Family History  Problem Relation Age of Onset  . Leukemia Father   . Cancer Father   . Heart disease Father   . Heart disease Mother   . Heart disease Other   . Arthritis Other   . Asthma Other   . Colon cancer Other        unknown, possibly father    CURRENT MEDICATIONS:  Outpatient Encounter Medications as of 07/05/2019  Medication Sig  . aspirin EC 81 MG tablet Take 81 mg by mouth daily.  . clopidogrel (PLAVIX) 75 MG tablet Take 1 tablet (75 mg total) by mouth daily. PLEASE SCHEDULE APPOINTMENT FOR FURTHER REFILLS. 2ND ATTEMPT  . dexlansoprazole (DEXILANT) 60 MG capsule TAKE 1 CAPSULE ONCE DAILY  . escitalopram (LEXAPRO) 10 MG tablet Take 10 mg by mouth Daily.  . fenofibrate 160 MG tablet   . folic acid (FOLVITE) 1 MG tablet Take 1 tablet (1 mg total) by mouth daily.  . metoprolol tartrate (LOPRESSOR) 25 MG tablet Take 0.5 tablets (12.5 mg total) by mouth 2 (two) times daily as needed.  . Red Yeast Rice  600 MG CAPS Take 1 capsule by mouth 2 (two) times daily.  . rosuvastatin (CRESTOR) 20 MG tablet   . vitamin B-12 (CYANOCOBALAMIN) 1000 MCG tablet Take 1 tablet (1,000 mcg total) by mouth daily.  . cyclobenzaprine (FLEXERIL) 10 MG tablet Take 5-10 mg by mouth 3 (three) times daily as needed for muscle spasms.    Facility-Administered Encounter Medications as of 07/05/2019  Medication  . cyanocobalamin ((VITAMIN B-12)) injection 1,000 mcg    ALLERGIES:  Allergies  Allergen Reactions  . Latex Rash    Blisters, rash  . Codeine Nausea And Vomiting     PHYSICAL EXAM:  ECOG Performance status: 1  Vitals:   07/05/19 1048  BP: 136/79  Pulse: 85  Resp: 18  Temp: (!) 97.5 F (36.4 C)  SpO2: 99%   Filed Weights   07/05/19 1048  Weight: 149 lb 12.8 oz (67.9 kg)    Physical Exam Constitutional:      Appearance: Normal appearance.  HENT:     Head: Normocephalic.     Right Ear: External ear normal.     Left Ear: External ear normal.     Nose: Nose normal.     Mouth/Throat:     Pharynx: Oropharynx is clear.  Eyes:     Conjunctiva/sclera: Conjunctivae normal.  Cardiovascular:     Rate and Rhythm: Normal rate and regular rhythm.     Pulses: Normal pulses.     Heart sounds: Normal heart sounds.  Pulmonary:     Effort: Pulmonary effort is normal.     Breath sounds: Normal breath sounds.  Abdominal:     General: Bowel sounds are normal.  Musculoskeletal:        General: Normal range of motion.     Cervical back: Normal range of motion.  Skin:    General: Skin is warm and dry.  Neurological:     General: No focal deficit present.     Mental Status: She is alert and oriented to person, place, and time.  Psychiatric:        Mood and Affect: Mood normal.        Behavior: Behavior normal.      LABORATORY DATA:  I have reviewed the labs as listed.  CBC    Component Value Date/Time   WBC 5.4 07/02/2019 1210   RBC 4.56 07/02/2019 1210   HGB 15.1 (H) 07/02/2019 1210    HCT 47.7 (H) 07/02/2019 1210   PLT 154 07/02/2019 1210   MCV 104.6 (H) 07/02/2019 1210   MCH 33.1 07/02/2019 1210   MCHC 31.7 07/02/2019 1210   RDW 13.5 07/02/2019 1210   LYMPHSABS 1.1 07/02/2019 1210   MONOABS 0.2 07/02/2019 1210   EOSABS 0.1 07/02/2019 1210   BASOSABS 0.1 07/02/2019 1210   CMP Latest Ref Rng & Units 07/02/2019 12/22/2018 04/25/2018  Glucose 70 - 99 mg/dL 124(H) 118(H) 130(H)  BUN 6 - 20 mg/dL 16 16 21(H)  Creatinine 0.44 - 1.00 mg/dL 1.10(H) 1.19(H) 1.07(H)  Sodium 135 - 145 mmol/L 139 139 139  Potassium 3.5 - 5.1 mmol/L 4.2 4.4 4.1  Chloride 98 - 111 mmol/L 110 106 110  CO2 22 - 32 mmol/L 20(L) 21(L) 20(L)  Calcium 8.9 - 10.3 mg/dL 9.5 9.7 9.1  Total Protein 6.5 - 8.1 g/dL 7.4 7.7 -  Total Bilirubin 0.3 - 1.2 mg/dL 0.3 0.5 -  Alkaline Phos 38 - 126 U/L 89 94 -  AST 15 - 41 U/L 15 19 -  ALT 0 - 44 U/L 16 21 -         ASSESSMENT & PLAN:   Iron deficiency 1.  Iron deficiency state: - Has received intermittent parenteral iron therapy from 2015 through 2018, last Feraheme on 05/01/2018. - Denies any bleeding per rectum or melena.  Started to feel tired lately. -Hemoglobin is improved now 15.9, hematocrit 47.7, MCV 104.6.  Vitamin B12 level is elevated.  Recommend we hold vitamin B-12 injections at this time.  Elevated hemoglobin may be secondary to smoking.  Educated the patient on the harmful risk of smoking and advised her to stop. -We will repeat labs in 3 months.  2.  Smoking history: - She smokes 1 pack/day for more than 50 years. -I have reviewed results  of the CT of the chest dated 04/13/2018 shows lung-RADS 2. -Repeat CT chest from December 2020 was unchanged from prior CT indicating benign appearance or behavior with the lung RADS of 2.        [place] [phone]

## 2019-07-05 NOTE — Progress Notes (Signed)
Vit B12 injection held today per RNester NP

## 2019-07-05 NOTE — Patient Instructions (Addendum)
[place] at [facility] Discharge Instructions  Vit B12 injection held today per NP. Follow-up as scheduled. Call clinic for any questions or concerns   Thank you for choosing [place] at [facility] to provide your oncology and hematology care.  To afford each patient quality time with our provider, please arrive at least 15 minutes before your scheduled appointment time.   If you have a lab appointment with the [facility] please come in thru the Main Entrance and check in at the main information desk.  You need to re-schedule your appointment should you arrive 10 or more minutes late.  We strive to give you quality time with our providers, and arriving late affects you and other patients whose appointments are after yours.  Also, if you no show three or more times for appointments you may be dismissed from the clinic at the providers discretion.     Again, thank you for choosing [facility].  Our hope is that these requests will decrease the amount of time that you wait before being seen by our physicians.       _____________________________________________________________  Should you have questions after your visit to [facility], please contact our office at [phone] between the hours of 8:00 a.m. and 4:30 p.m.  Voicemails left after 4:00 p.m. will not be returned until the following business day.  For prescription refill requests, have your pharmacy contact our office and allow 72 hours.    Due to Covid, you will need to wear a mask upon entering the hospital. If you do not have a mask, a mask will be given to you at the Main Entrance upon arrival. For doctor visits, patients may have 1 support person with them. For treatment visits, patients can not have anyone with them due to social distancing guidelines and our immunocompromised population.

## 2019-07-05 NOTE — Assessment & Plan Note (Signed)
1.  Iron deficiency state: - Has received intermittent parenteral iron therapy from 2015 through 2018, last Feraheme on 05/01/2018. - Denies any bleeding per rectum or melena.  Started to feel tired lately. -Hemoglobin is improved now 15.9, hematocrit 47.7, MCV 104.6.  Vitamin B12 level is elevated.  Recommend we hold vitamin B-12 injections at this time.  Elevated hemoglobin may be secondary to smoking.  Educated the patient on the harmful risk of smoking and advised her to stop. -We will repeat labs in 3 months.  2.  Smoking history: - She smokes 1 pack/day for more than 50 years. -I have reviewed results of the CT of the chest dated 04/13/2018 shows lung-RADS 2. -Repeat CT chest from December 2020 was unchanged from prior CT indicating benign appearance or behavior with the lung RADS of 2.

## 2019-09-26 ENCOUNTER — Other Ambulatory Visit (HOSPITAL_COMMUNITY): Payer: Commercial Managed Care - PPO

## 2019-09-28 ENCOUNTER — Other Ambulatory Visit (HOSPITAL_COMMUNITY): Payer: Self-pay | Admitting: *Deleted

## 2019-09-28 DIAGNOSIS — E538 Deficiency of other specified B group vitamins: Secondary | ICD-10-CM

## 2019-09-28 DIAGNOSIS — E611 Iron deficiency: Secondary | ICD-10-CM

## 2019-10-01 ENCOUNTER — Inpatient Hospital Stay (HOSPITAL_COMMUNITY): Payer: Commercial Managed Care - PPO | Attending: Hematology

## 2019-10-01 ENCOUNTER — Ambulatory Visit (HOSPITAL_COMMUNITY)
Admission: RE | Admit: 2019-10-01 | Discharge: 2019-10-01 | Disposition: A | Discharge status: Home / Self Care | Payer: Commercial Managed Care - PPO | Source: Ambulatory Visit | Attending: Hematology | Admitting: Hematology

## 2019-10-01 ENCOUNTER — Other Ambulatory Visit: Payer: Self-pay

## 2019-10-01 DIAGNOSIS — R5383 Other fatigue: Secondary | ICD-10-CM | POA: Insufficient documentation

## 2019-10-01 DIAGNOSIS — E785 Hyperlipidemia, unspecified: Secondary | ICD-10-CM | POA: Insufficient documentation

## 2019-10-01 DIAGNOSIS — Z7982 Long term (current) use of aspirin: Secondary | ICD-10-CM | POA: Insufficient documentation

## 2019-10-01 DIAGNOSIS — R911 Solitary pulmonary nodule: Secondary | ICD-10-CM | POA: Diagnosis present

## 2019-10-01 DIAGNOSIS — F419 Anxiety disorder, unspecified: Secondary | ICD-10-CM | POA: Insufficient documentation

## 2019-10-01 DIAGNOSIS — M1990 Unspecified osteoarthritis, unspecified site: Secondary | ICD-10-CM | POA: Diagnosis not present

## 2019-10-01 DIAGNOSIS — F17210 Nicotine dependence, cigarettes, uncomplicated: Secondary | ICD-10-CM | POA: Insufficient documentation

## 2019-10-01 DIAGNOSIS — I739 Peripheral vascular disease, unspecified: Secondary | ICD-10-CM | POA: Diagnosis not present

## 2019-10-01 DIAGNOSIS — D509 Iron deficiency anemia, unspecified: Secondary | ICD-10-CM | POA: Diagnosis not present

## 2019-10-01 DIAGNOSIS — E559 Vitamin D deficiency, unspecified: Secondary | ICD-10-CM | POA: Diagnosis not present

## 2019-10-01 DIAGNOSIS — Z7902 Long term (current) use of antithrombotics/antiplatelets: Secondary | ICD-10-CM | POA: Diagnosis not present

## 2019-10-01 DIAGNOSIS — E611 Iron deficiency: Secondary | ICD-10-CM

## 2019-10-01 DIAGNOSIS — I10 Essential (primary) hypertension: Secondary | ICD-10-CM | POA: Diagnosis not present

## 2019-10-01 DIAGNOSIS — Z79899 Other long term (current) drug therapy: Secondary | ICD-10-CM | POA: Diagnosis not present

## 2019-10-01 DIAGNOSIS — E538 Deficiency of other specified B group vitamins: Secondary | ICD-10-CM

## 2019-10-01 DIAGNOSIS — K219 Gastro-esophageal reflux disease without esophagitis: Secondary | ICD-10-CM | POA: Diagnosis not present

## 2019-10-01 LAB — IRON AND TIBC
Iron: 76 ug/dL (ref 28–170)
Saturation Ratios: 20 % (ref 10.4–31.8)
TIBC: 384 ug/dL (ref 250–450)
UIBC: 308 ug/dL

## 2019-10-01 LAB — CBC WITH DIFFERENTIAL/PLATELET
Abs Immature Granulocytes: 0.01 10*3/uL (ref 0.00–0.07)
Basophils Absolute: 0.1 10*3/uL (ref 0.0–0.1)
Basophils Relative: 1 %
Eosinophils Absolute: 0.1 10*3/uL (ref 0.0–0.5)
Eosinophils Relative: 3 %
HCT: 49.3 % — ABNORMAL HIGH (ref 36.0–46.0)
Hemoglobin: 15.9 g/dL — ABNORMAL HIGH (ref 12.0–15.0)
Immature Granulocytes: 0 %
Lymphocytes Relative: 24 %
Lymphs Abs: 1.2 10*3/uL (ref 0.7–4.0)
MCH: 33.5 pg (ref 26.0–34.0)
MCHC: 32.3 g/dL (ref 30.0–36.0)
MCV: 104 fL — ABNORMAL HIGH (ref 80.0–100.0)
Monocytes Absolute: 0.3 10*3/uL (ref 0.1–1.0)
Monocytes Relative: 6 %
Neutro Abs: 3.2 10*3/uL (ref 1.7–7.7)
Neutrophils Relative %: 66 %
Platelets: 165 10*3/uL (ref 150–400)
RBC: 4.74 MIL/uL (ref 3.87–5.11)
RDW: 13.4 % (ref 11.5–15.5)
WBC: 4.9 10*3/uL (ref 4.0–10.5)
nRBC: 0 % (ref 0.0–0.2)

## 2019-10-01 LAB — COMPREHENSIVE METABOLIC PANEL
ALT: 22 U/L (ref 0–44)
AST: 19 U/L (ref 15–41)
Albumin: 4.2 g/dL (ref 3.5–5.0)
Alkaline Phosphatase: 89 U/L (ref 38–126)
Anion gap: 9 (ref 5–15)
BUN: 14 mg/dL (ref 6–20)
CO2: 21 mmol/L — ABNORMAL LOW (ref 22–32)
Calcium: 9.3 mg/dL (ref 8.9–10.3)
Chloride: 106 mmol/L (ref 98–111)
Creatinine, Ser: 0.99 mg/dL (ref 0.44–1.00)
GFR calc Af Amer: >60 mL/min (ref >60)
GFR calc non Af Amer: >60 mL/min (ref >60)
Glucose, Bld: 65 mg/dL — ABNORMAL LOW (ref 70–99)
Potassium: 4.3 mmol/L (ref 3.5–5.1)
Sodium: 136 mmol/L (ref 135–145)
Total Bilirubin: 0.5 mg/dL (ref 0.3–1.2)
Total Protein: 7.5 g/dL (ref 6.5–8.1)

## 2019-10-01 LAB — FERRITIN: Ferritin: 121 ng/mL (ref 11–307)

## 2019-10-01 LAB — VITAMIN B12: Vitamin B-12: 1961 pg/mL — ABNORMAL HIGH (ref 180–914)

## 2019-10-01 LAB — FOLATE: Folate: 76.3 ng/mL (ref >5.9)

## 2019-10-02 ENCOUNTER — Encounter (HOSPITAL_COMMUNITY): Payer: Self-pay | Admitting: *Deleted

## 2019-10-02 ENCOUNTER — Other Ambulatory Visit (HOSPITAL_COMMUNITY): Payer: Self-pay | Admitting: *Deleted

## 2019-10-02 DIAGNOSIS — R911 Solitary pulmonary nodule: Secondary | ICD-10-CM

## 2019-10-02 NOTE — Progress Notes (Signed)
Orders placed for LDCT nodule follow up scan in 6 months per recommendations of radiologist.

## 2019-10-02 NOTE — Progress Notes (Signed)
Patient notified via telephone of LDCT lung cancer screening results with recommendations to follow up in 6 months.  Also notified of incidental findings and need to follow up with PCP.  She is to follow up with [place] tomorrow as previously scheduled.  She verbalizes understanding.  Patient's referring provider was sent a copy of results.    IMPRESSION: 1. Lung-RADS 3, probably benign findings. Short-term follow-up in 6 months is recommended with repeat low-dose chest CT without contrast (please use the following order, "CT CHEST LCS NODULE FOLLOW-UP W/O CM"). Bilateral pulmonary nodules, including a dominant right upper lobe pulmonary nodule are again identified. Although the dominant nodule measures only minimally larger today, it is significantly enlarged compared back to 04/13/2018. 2. Aortic atherosclerosis (ICD10-I70.0) and emphysema (ICD10-J43.9). 3. Bilateral adrenal adenomas. 4. Age advanced coronary artery atherosclerosis. Recommend assessment of coronary risk factors and consideration of medical therapy.

## 2019-10-03 ENCOUNTER — Other Ambulatory Visit: Payer: Self-pay

## 2019-10-03 ENCOUNTER — Inpatient Hospital Stay (HOSPITAL_COMMUNITY): Payer: Commercial Managed Care - PPO | Admitting: Nurse Practitioner

## 2019-10-03 VITALS — BP 115/88 | HR 61 | Temp 96.9°F | Resp 18 | Wt 144.8 lb

## 2019-10-03 DIAGNOSIS — R911 Solitary pulmonary nodule: Secondary | ICD-10-CM | POA: Diagnosis not present

## 2019-10-03 DIAGNOSIS — D509 Iron deficiency anemia, unspecified: Secondary | ICD-10-CM | POA: Diagnosis not present

## 2019-10-03 DIAGNOSIS — E611 Iron deficiency: Secondary | ICD-10-CM

## 2019-10-03 NOTE — Progress Notes (Signed)
[place], [place] [address]   CLINIC:  Medical Oncology/Hematology  PCP:  Celene Squibb, MD [address] Liana Crocker [place] [address] [phone]   REASON FOR VISIT: Follow-up for iron deficiency anemia  CURRENT THERAPY: Intermittent iron infusions   INTERVAL HISTORY:  [name] 59 y.o. female returns for routine follow-up for iron deficiency anemia.  Patient reports she has been doing well since her last visit.  She does report some mild fatigue during the day.  She denies any bright red bleeding per rectum or melena.  She denies any easy bruising or bleeding. Denies any nausea, vomiting, or diarrhea. Denies any new pains. Had not noticed any recent bleeding such as epistaxis, hematuria or hematochezia. Denies recent chest pain on exertion, shortness of breath on minimal exertion, pre-syncopal episodes, or palpitations. Denies any numbness or tingling in hands or feet. Denies any recent fevers, infections, or recent hospitalizations. Patient reports appetite at 50% and energy level at 50%.  She has maintain her weight at this time.     REVIEW OF SYSTEMS:  Review of Systems  Constitutional: Positive for fatigue.  Gastrointestinal: Positive for diarrhea.  All other systems reviewed and are negative.    PAST MEDICAL/SURGICAL HISTORY:  Past Medical History:  Diagnosis Date  . Anxiety   . Arthritis    "hands; spine too" (04/04/2012)  . Exertional dyspnea   . Folate deficiency 10/17/2015  . GERD (gastroesophageal reflux disease)   . Hyperlipemia   . Hypertension   . Macrocytosis 10/17/2015  . Neck pain, chronic   . PAD (peripheral artery disease) ([place])   . Renal artery stenosis ([place])   . Seasonal allergies   . Tobacco abuse    Past Surgical History:  Procedure Laterality Date  . ABDOMINAL ANGIOGRAM  04/04/2012   Procedure: ABDOMINAL ANGIOGRAM;  Surgeon: Lorretta Harp, MD;  Location: [facility] CATH LAB;  Service: Cardiovascular;;  .  ANTERIOR CERVICAL DECOMP/DISCECTOMY FUSION  ~ 04/2006   C6, 7, T1  . COLONOSCOPY N/A 02/26/2014   Adequate preparation. Minimal anal canal hemorrhoids;otherwise, normal rectumdiminutive polyps in the middescending segment; otherwise, the remainder of colonic mucosaappeared normal. Hyperplastic polyps.  . ESOPHAGOGASTRODUODENOSCOPY N/A 02/26/2014   LOV:FIEPPIRJ distal esophagus, query short segment Barrett's. Status post Venia Minks dilation with esophageal biopsy. Abnormal gastric mucosa. Esophageal biopsy negative for Barrett's. Gastric biopsy showed mild chronic gastritis, no H pylori.  Marland Kitchen LAPAROSCOPIC APPENDECTOMY N/A 09/10/2013   Procedure: APPENDECTOMY LAPAROSCOPIC;  Surgeon: Jamesetta So, MD;  Location: [facility] ORS;  Service: General;  Laterality: N/A;  . LOWER EXTREMITY ANGIOGRAM N/A 04/04/2012   Procedure: LOWER EXTREMITY ANGIOGRAM;  Surgeon: Lorretta Harp, MD;  Location: [facility] CATH LAB;  Service: Cardiovascular;  Laterality: N/A;  . Venia Minks DILATION N/A 02/26/2014   Procedure: Venia Minks DILATION;  Surgeon: Daneil Dolin, MD;  Location: [facility] ENDO SUITE;  Service: Endoscopy;  Laterality: N/A;  . PERIPHERAL ARTERIAL STENT GRAFT  04/04/2012  . RENAL ANGIOGRAM Left 04/22/2014   Procedure: RENAL ANGIOGRAM;  Surgeon: Lorretta Harp, MD;  Location: [facility] CATH LAB;  Service: Cardiovascular;  Laterality: Left;  . TUBAL LIGATION  1990     SOCIAL HISTORY:  Social History   Socioeconomic History  . Marital status: Married    Spouse name: Not on file  . Number of children: Not on file  . Years of education: Not on file  . Highest education level: Not on file  Occupational History  . Occupation: [employer]  Employer: [employer]    Comment: [address]  Tobacco Use  . Smoking status: Current Every Day Smoker    Packs/day: 0.75    Years: 36.00    Pack years: 27.00    Types: Cigarettes  . Smokeless tobacco: Never Used  Substance and Sexual Activity  . Alcohol use: No  . Drug use: No  .  Sexual activity: Never  Other Topics Concern  . Not on file  Social History Narrative  . Not on file   Social Determinants of Health   Financial Resource Strain:   . Difficulty of Paying Living Expenses:   Food Insecurity:   . Worried About Charity fundraiser in the Last Year:   . Arboriculturist in the Last Year:   Transportation Needs:   . Film/video editor (Medical):   Marland Kitchen Lack of Transportation (Non-Medical):   Physical Activity:   . Days of Exercise per Week:   . Minutes of Exercise per Session:   Stress:   . Feeling of Stress :   Social Connections:   . Frequency of Communication with Friends and Family:   . Frequency of Social Gatherings with Friends and Family:   . Attends Religious Services:   . Active Member of Clubs or Organizations:   . Attends Archivist Meetings:   Marland Kitchen Marital Status:   Intimate Partner Violence:   . Fear of Current or Ex-Partner:   . Emotionally Abused:   Marland Kitchen Physically Abused:   . Sexually Abused:     FAMILY HISTORY:  Family History  Problem Relation Age of Onset  . Leukemia Father   . Cancer Father   . Heart disease Father   . Heart disease Mother   . Heart disease Other   . Arthritis Other   . Asthma Other   . Colon cancer Other        unknown, possibly father    CURRENT MEDICATIONS:  Outpatient Encounter Medications as of 10/03/2019  Medication Sig  . aspirin EC 81 MG tablet Take 81 mg by mouth daily.  . clopidogrel (PLAVIX) 75 MG tablet Take 1 tablet (75 mg total) by mouth daily. PLEASE SCHEDULE APPOINTMENT FOR FURTHER REFILLS. 2ND ATTEMPT  . dexlansoprazole (DEXILANT) 60 MG capsule TAKE 1 CAPSULE ONCE DAILY  . escitalopram (LEXAPRO) 10 MG tablet Take 10 mg by mouth Daily.  . fenofibrate 160 MG tablet Take 160 mg by mouth daily.   . folic acid (FOLVITE) 1 MG tablet Take 1 tablet (1 mg total) by mouth daily.  . metoprolol tartrate (LOPRESSOR) 25 MG tablet Take 0.5 tablets (12.5 mg total) by mouth 2 (two) times  daily as needed.  . Red Yeast Rice 600 MG CAPS Take 1 capsule by mouth 2 (two) times daily.  . rosuvastatin (CRESTOR) 20 MG tablet Take 20 mg by mouth daily.   . vitamin B-12 (CYANOCOBALAMIN) 1000 MCG tablet Take 1 tablet (1,000 mcg total) by mouth daily.  . cyclobenzaprine (FLEXERIL) 10 MG tablet Take 5-10 mg by mouth 3 (three) times daily as needed for muscle spasms.    No facility-administered encounter medications on file as of 10/03/2019.    ALLERGIES:  Allergies  Allergen Reactions  . Latex Rash    Blisters, rash  . Codeine Nausea And Vomiting     PHYSICAL EXAM:  ECOG Performance status: 1  Vitals:   10/03/19 1148  BP: 115/88  Pulse: 61  Resp: 18  Temp: (!) 96.9 F (36.1 C)  SpO2: 97%   Filed Weights   10/03/19 1148  Weight: 144 lb 12.8 oz (65.7 kg)    Physical Exam Constitutional:      Appearance: Normal appearance. She is normal weight.  Cardiovascular:     Rate and Rhythm: Normal rate and regular rhythm.     Heart sounds: Normal heart sounds.  Pulmonary:     Effort: Pulmonary effort is normal.     Breath sounds: Normal breath sounds.  Abdominal:     General: Bowel sounds are normal.     Palpations: Abdomen is soft.  Musculoskeletal:        General: Normal range of motion.  Skin:    General: Skin is warm.  Neurological:     Mental Status: She is alert and oriented to person, place, and time. Mental status is at baseline.  Psychiatric:        Mood and Affect: Mood normal.        Behavior: Behavior normal.        Thought Content: Thought content normal.        Judgment: Judgment normal.      LABORATORY DATA:  I have reviewed the labs as listed.  CBC    Component Value Date/Time   WBC 4.9 10/01/2019 1440   RBC 4.74 10/01/2019 1440   HGB 15.9 (H) 10/01/2019 1440   HCT 49.3 (H) 10/01/2019 1440   PLT 165 10/01/2019 1440   MCV 104.0 (H) 10/01/2019 1440   MCH 33.5 10/01/2019 1440   MCHC 32.3 10/01/2019 1440   RDW 13.4 10/01/2019 1440    LYMPHSABS 1.2 10/01/2019 1440   MONOABS 0.3 10/01/2019 1440   EOSABS 0.1 10/01/2019 1440   BASOSABS 0.1 10/01/2019 1440   CMP Latest Ref Rng & Units 10/01/2019 07/02/2019 12/22/2018  Glucose 70 - 99 mg/dL 65(L) 124(H) 118(H)  BUN 6 - 20 mg/dL 14 16 16   Creatinine 0.44 - 1.00 mg/dL 0.99 1.10(H) 1.19(H)  Sodium 135 - 145 mmol/L 136 139 139  Potassium 3.5 - 5.1 mmol/L 4.3 4.2 4.4  Chloride 98 - 111 mmol/L 106 110 106  CO2 22 - 32 mmol/L 21(L) 20(L) 21(L)  Calcium 8.9 - 10.3 mg/dL 9.3 9.5 9.7  Total Protein 6.5 - 8.1 g/dL 7.5 7.4 7.7  Total Bilirubin 0.3 - 1.2 mg/dL 0.5 0.3 0.5  Alkaline Phos 38 - 126 U/L 89 89 94  AST 15 - 41 U/L 19 15 19   ALT 0 - 44 U/L 22 16 21     DIAGNOSTIC IMAGING:  I have independently reviewed the CT scan and discussed with the patient.    I personally performed a face-to-face visit.  All questions were answered to patient's stated satisfaction. Encouraged patient to call with any new concerns or questions before his next visit to the cancer center and we can certain see him sooner, if needed.     ASSESSMENT & PLAN:   Iron deficiency 1.  Iron deficiency state: -She has received intermittent iron from 2015 through 2018. -Last Feraheme infusion was on 12/28/2018. -She denies any bright red bleeding per rectum or melena.  She does report daily fatigue. -Vitamin B12 level has been elevated.  She was on B12 injections which we are holding at the moment. -She also has elevated hemoglobin may be secondary to smoking.  Patient was educated on the harmful risk of smoking and advised her to stop.  She has not stop smoking yet. -Labs done on 10/01/2019 showed hemoglobin 15.9, ferritin 121, percent saturation 20. -I  do not recommend any IV iron at this time. -She will follow-up in 6 months with repeat labs.  2.  Smoking history: -She smokes 1 pack/day for more than 50 years. -CT of the chest on 04/13/2018 shows lung RADS 2. -Low-dose CT on 10/01/2019 shows lung RADS 3  suggested repeat scan in 6 months.      Orders placed this encounter:  Orders Placed This Encounter  Procedures  . CT CHEST NODULE FOLLOW UP LOW DOSE WO CM  . Lactate dehydrogenase  . CBC with Differential/Platelet  . Comprehensive metabolic panel  . Ferritin  . Iron and TIBC  . Vitamin B12  . VITAMIN D 25 Hydroxy (Vit-D Deficiency, Fractures)      Francene Finders, FNP-C [place] [phone]

## 2019-10-03 NOTE — Patient Instructions (Addendum)
[facility] at [facility] Discharge Instructions  Follow up in 6 months with labs and CT scan   Thank you for choosing [facility] at [facility] to provide your oncology and hematology care.  To afford each patient quality time with our provider, please arrive at least 15 minutes before your scheduled appointment time.   If you have a lab appointment with the Cancer Center please come in thru the Main Entrance and check in at the main information desk.  You need to re-schedule your appointment should you arrive 10 or more minutes late.  We strive to give you quality time with our providers, and arriving late affects you and other patients whose appointments are after yours.  Also, if you no show three or more times for appointments you may be dismissed from the clinic at the providers discretion.     Again, thank you for choosing [facility].  Our hope is that these requests will decrease the amount of time that you wait before being seen by our physicians.       _____________________________________________________________  Should you have questions after your visit to [facility], please contact our office at [phone] between the hours of 8:00 a.m. and 4:30 p.m.  Voicemails left after 4:00 p.m. will not be returned until the following business day.  For prescription refill requests, have your pharmacy contact our office and allow 72 hours.    Due to Covid, you will need to wear a mask upon entering the hospital. If you do not have a mask, a mask will be given to you at the Main Entrance upon arrival. For doctor visits, patients may have 1 support person with them. For treatment visits, patients can not have anyone with them due to social distancing guidelines and our immunocompromised population.      

## 2019-10-03 NOTE — Assessment & Plan Note (Signed)
1.  Iron deficiency state: -She has received intermittent iron from 2015 through 2018. -Last Feraheme infusion was on 12/28/2018. -She denies any bright red bleeding per rectum or melena.  She does report daily fatigue. -Vitamin B12 level has been elevated.  She was on B12 injections which we are holding at the moment. -She also has elevated hemoglobin may be secondary to smoking.  Patient was educated on the harmful risk of smoking and advised her to stop.  She has not stop smoking yet. -Labs done on 10/01/2019 showed hemoglobin 15.9, ferritin 121, percent saturation 20. -I do not recommend any IV iron at this time. -She will follow-up in 6 months with repeat labs.  2.  Smoking history: -She smokes 1 pack/day for more than 50 years. -CT of the chest on 04/13/2018 shows lung RADS 2. -Low-dose CT on 10/01/2019 shows lung RADS 3 suggested repeat scan in 6 months.

## 2019-12-21 ENCOUNTER — Encounter: Payer: Self-pay | Admitting: General Practice

## 2020-01-14 ENCOUNTER — Other Ambulatory Visit (HOSPITAL_COMMUNITY): Payer: Self-pay | Admitting: Adult Health Nurse Practitioner

## 2020-01-14 DIAGNOSIS — Z1231 Encounter for screening mammogram for malignant neoplasm of breast: Secondary | ICD-10-CM

## 2020-01-16 ENCOUNTER — Other Ambulatory Visit: Payer: Self-pay

## 2020-01-16 ENCOUNTER — Ambulatory Visit (HOSPITAL_COMMUNITY)
Admission: RE | Admit: 2020-01-16 | Discharge: 2020-01-16 | Disposition: A | Discharge status: Home / Self Care | Payer: Commercial Managed Care - PPO | Source: Ambulatory Visit | Attending: Adult Health Nurse Practitioner | Admitting: Adult Health Nurse Practitioner

## 2020-01-16 DIAGNOSIS — Z1231 Encounter for screening mammogram for malignant neoplasm of breast: Secondary | ICD-10-CM

## 2020-01-25 ENCOUNTER — Other Ambulatory Visit (HOSPITAL_COMMUNITY): Payer: Self-pay | Admitting: Hematology

## 2020-01-25 DIAGNOSIS — K2100 Gastro-esophageal reflux disease with esophagitis, without bleeding: Secondary | ICD-10-CM

## 2020-02-26 ENCOUNTER — Institutional Professional Consult (permissible substitution): Payer: Commercial Managed Care - PPO | Admitting: Internal Medicine

## 2020-02-29 ENCOUNTER — Other Ambulatory Visit: Payer: Self-pay

## 2020-02-29 ENCOUNTER — Encounter: Payer: Self-pay | Admitting: Internal Medicine

## 2020-02-29 ENCOUNTER — Ambulatory Visit: Payer: Commercial Managed Care - PPO | Admitting: Internal Medicine

## 2020-02-29 DIAGNOSIS — J449 Chronic obstructive pulmonary disease, unspecified: Secondary | ICD-10-CM | POA: Diagnosis not present

## 2020-02-29 DIAGNOSIS — R918 Other nonspecific abnormal finding of lung field: Secondary | ICD-10-CM

## 2020-02-29 DIAGNOSIS — F17210 Nicotine dependence, cigarettes, uncomplicated: Secondary | ICD-10-CM

## 2020-02-29 DIAGNOSIS — R911 Solitary pulmonary nodule: Secondary | ICD-10-CM | POA: Insufficient documentation

## 2020-02-29 MED ORDER — BREZTRI AEROSPHERE 160-9-4.8 MCG/ACT IN AERO: 2.0000 | INHALATION_SPRAY | Freq: Two times a day (BID) | RESPIRATORY_TRACT | 11 refills | Status: DC

## 2020-02-29 MED ORDER — BREZTRI AEROSPHERE 160-9-4.8 MCG/ACT IN AERO: 2.0000 | INHALATION_SPRAY | Freq: Two times a day (BID) | RESPIRATORY_TRACT | 0 refills | Status: DC

## 2020-02-29 NOTE — Assessment & Plan Note (Addendum)
Counseled re importance of smoking cessation but did not meet time criteria for separate billing            Each maintenance medication was reviewed in detail including emphasizing most importantly the difference between maintenance and prns and under what circumstances the prns are to be triggered using an action plan format where appropriate.  Total time for H and P, chart review, counseling, teaching device and generating customized AVS unique to this new t office visit / charting = 46 min

## 2020-02-29 NOTE — Assessment & Plan Note (Signed)
Largest 8 mm as of 3 / 15/21 > f/u q 6 m planned   CT results reviewed with pt >>> Too small for PET or bx, not suspicious enough for excisional bx > really only option for now is follow the Fleischner society guidelines as rec by radiology > f/u q 6 m  Also informed of risk of ca related to smoking and how stopping smoking saves more lives than serial ct and these are not mutually exclusive so should continue both

## 2020-02-29 NOTE — Progress Notes (Signed)
Connie Farrell, female    DOB: 1961/02/20, 59 y.o.   MRN: [id number]   Brief patient profile:  33 yowf active smoker worked in [employer] until around 2000 with onset around 2019 gradually worse since just started on Breztri June 2021 and referred to pulmonary clinic by Dr Juel Burrow office / Elenor Quinones     History of Present Illness  02/29/2020  Pulmonary/ 1st office eval/ Connie Farrell / [place] [facility]  Chief Complaint  Patient presents with  . Pulmonary Consult    Referred by Pablo Lawrence, NP. Pt c/o SOB x 6 months. She also c/o cough and wheezing- cough with white to brown, thick sputum. She does not have a rescue inhaler and is only using Breztri.   Dyspnea:  Across parking lot and a couple of aisles at store/ poor on hills/step Cough: rattle varies / sev hours in am white sometimes very thick  Sleep: bed is flat/ 2 pillows due to neck  SABA use: doesn't have it yet  No obvious patterns in day to day or daytime variability or assoc  purulent sputum or mucus plugs or hemoptysis or cp or chest tightness, subjective wheeze or overt sinus or hb symptoms.   sleeping without nocturnal   exacerbation  of respiratory  c/o's or need for noct saba. Also denies any obvious fluctuation of symptoms with weather or environmental changes or other aggravating or alleviating factors except as outlined above   No unusual exposure hx or h/o childhood pna/ asthma or knowledge of premature birth.  Current Allergies, Complete Past Medical History, Past Surgical History, Family History, and Social History were reviewed in [employer] record.  ROS  The following are not active complaints unless bolded Hoarseness varies esp in am, sore throat, dysphagia, dental problems, itching, sneezing,  nasal congestion or discharge of excess mucus or purulent secretions, ear ache,   fever, chills, sweats, unintended wt loss or wt gain, classically pleuritic or exertional cp,  orthopnea pnd  or arm/hand swelling  or leg swelling, presyncope, palpitations, abdominal pain, anorexia, nausea, vomiting, diarrhea  or change in bowel habits or change in bladder habits, change in stools or change in urine, dysuria, hematuria,  rash, arthralgias, visual complaints, headache, numbness, weakness or ataxia or problems with walking or coordination,  change in mood or  memory.           Past Medical History:  Diagnosis Date  . Anxiety   . Arthritis    "hands; spine too" (04/04/2012)  . Exertional dyspnea   . Folate deficiency 10/17/2015  . GERD (gastroesophageal reflux disease)   . Hyperlipemia   . Hypertension   . Macrocytosis 10/17/2015  . Neck pain, chronic   . PAD (peripheral artery disease) ([place])   . Renal artery stenosis ([place])   . Seasonal allergies   . Tobacco abuse     Outpatient Medications Prior to Visit  Medication Sig Dispense Refill  . aspirin EC 81 MG tablet Take 81 mg by mouth daily.    . Budeson-Glycopyrrol-Formoterol (BREZTRI AEROSPHERE) 160-9-4.8 MCG/ACT AERO Inhale 2 puffs into the lungs in the morning and at bedtime.    . clopidogrel (PLAVIX) 75 MG tablet Take 1 tablet (75 mg total) by mouth daily. PLEASE SCHEDULE APPOINTMENT FOR FURTHER REFILLS. 2ND ATTEMPT 15 tablet 0  . cyclobenzaprine (FLEXERIL) 10 MG tablet Take 5-10 mg by mouth 3 (three) times daily as needed for muscle spasms.     . DEXILANT 60 MG capsule TAKE 1 CAPSULE  ONCE DAILY 90 capsule 1  . escitalopram (LEXAPRO) 10 MG tablet Take 10 mg by mouth Daily.    . fenofibrate 160 MG tablet Take 160 mg by mouth daily.     . folic acid (FOLVITE) 1 MG tablet Take 1 tablet (1 mg total) by mouth daily. 90 tablet 4  . MELATONIN PO Take 1 tablet by mouth at bedtime.    . metoprolol tartrate (LOPRESSOR) 25 MG tablet Take 0.5 tablets (12.5 mg total) by mouth 2 (two) times daily as needed. 90 tablet 2  . Red Yeast Rice 600 MG CAPS Take 1 capsule by mouth 2 (two) times daily.    . rosuvastatin (CRESTOR) 20 MG tablet Take  20 mg by mouth daily.     . vitamin B-12 (CYANOCOBALAMIN) 1000 MCG tablet Take 1 tablet (1,000 mcg total) by mouth daily. 60 tablet 2   No facility-administered medications prior to visit.     Objective:     BP 124/84 (BP Location: Left Arm, Cuff Size: Normal)   Pulse 66   Temp 97.6 F (36.4 C) (Temporal)   Ht 5\' 4"  (1.626 m)   Wt 140 lb (63.5 kg)   SpO2 95% Comment: on RA  BMI 24.03 kg/m   SpO2: 95 % (on RA)   amb slt hoarse wf typical smoker's rattle  HEENT : pt wearing mask not removed for exam due to covid - 19 concerns.   NECK :  without JVD/Nodes/TM/ nl carotid upstrokes bilaterally   LUNGS: no acc muscle use,  Min barrel  contour chest wall with bilateral  slightly decreased bs s audible wheeze and  without cough on insp or exp maneuvers and min  Hyperresonant  to  percussion bilaterally     CV:  RRR  no s3 or murmur or increase in P2, and no edema   ABD:  soft and nontender with pos end  insp Hoover's  in the supine position. No bruits or organomegaly appreciated, bowel sounds nl  MS:   Nl gait/  ext warm without deformities, calf tenderness, cyanosis or clubbing No obvious joint restrictions   SKIN: warm and dry without lesions    NEURO:  alert, approp, nl sensorium with  no motor or cerebellar deficits apparent.         I personally reviewed images and agree with radiology impression as follows:   Chest CT 10/01/19  . Lung-RADS 3, probably benign findings. Short-term follow-up in 6 months is recommended with repeat low-dose chest CT without contrast (please use the following order, "CT CHEST LCS NODULE FOLLOW-UP W/O CM"). Bilateral pulmonary nodules, including a dominant right upper lobe pulmonary nodule are again identified. Although the dominant nodule measures only minimally larger today, it is significantly enlarged compared back to 04/13/2018. Largest is 75mm  2. Aortic atherosclerosis (ICD10-I70.0) and emphysema (ICD10-J43.9). 3. Bilateral adrenal  adenomas. 4. Age advanced coronary artery atherosclerosis. Recommend assessment of coronary risk factors and consideration of medical therapy.      Assessment   COPD GOLD ? / groud d/ still smoking  Active smoker - 02/29/2020  After extensive coaching inhaler device,  effectiveness =    75%   MMRC3 = can't walk 100 yards even at a slow pace at a flat grade s stopping due to sob  With active CB so for now will lable  Group D in terms of symptom/risk and laba/lama/ICS  therefore appropriate rx at this point >>>  Continue breztri/ prn saba and work on stopping smoking  I spent extra time with pt today reviewing appropriate use of albuterol for prn use on exertion with the following points: 1) saba is for relief of sob that does not improve by walking a slower pace or resting but rather if the pt does not improve after trying this first. 2) If the pt is convinced, as many are, that saba helps recover from activity faster then it's easy to tell if this is the case by re-challenging : ie stop, take the inhaler, then p 5 minutes try the exact same activity (intensity of workload) that just caused the symptoms and see if they are substantially diminished or not after saba 3) if there is an activity that reproducibly causes the symptoms, try the saba 15 min before the activity on alternate days   If in fact the saba really does help, then fine to continue to use it prn but advised may need to look closer at the maintenance regimen being used to achieve better control of airways disease with exertion.    Advised:  formulary restrictions will be an ongoing challenge for the forseable future and I would be happy to pick an alternative if the pt will first  provide me a list of them -  pt  will need to return here for training for any new device that is required eg dpi vs hfa vs respimat.    In the meantime we can always provide samples so that the patient never runs out of any needed respiratory  medications.   Also advised:  Pt informed of the seriousness of COVID 19 infection as a direct risk to lung health  and safey and to close contacts and should continue to wear a facemask in public and minimize exposure to public locations but especially avoid any area or activity where non-close contacts are not observing distancing or wearing an appropriate face mask.  I strongly recommended she take either of the vaccines available through local drugstores based on updated information on millions of Americans treated with the [employer] products  which have proven both safe and  effective even against the new delta variant.       Multiple pulmonary nodules determined by computed tomography of lung Largest 8 mm as of 3 / 15/21 > f/u q 6 m planned   CT results reviewed with pt >>> Too small for PET or bx, not suspicious enough for excisional bx > really only option for now is follow the Fleischner society guidelines as rec by radiology > f/u q 6 m  Also informed of risk of ca related to smoking and how stopping smoking saves more lives than serial ct and these are not mutually exclusive so should continue both   Cigarette smoker Counseled re importance of smoking cessation but did not meet time criteria for separate billing            Each maintenance medication was reviewed in detail including emphasizing most importantly the difference between maintenance and prns and under what circumstances the prns are to be triggered using an action plan format where appropriate.  Total time for H and P, chart review, counseling, teaching device and generating customized AVS unique to this new t office visit / charting = 46 min           Christinia Gully, MD 02/29/2020

## 2020-02-29 NOTE — Assessment & Plan Note (Signed)
Active smoker - 02/29/2020  After extensive coaching inhaler device,  effectiveness =    75%   MMRC3 = can't walk 100 yards even at a slow pace at a flat grade s stopping due to sob  With active CB so for now will lable  Group D in terms of symptom/risk and laba/lama/ICS  therefore appropriate rx at this point >>>  Continue breztri/ prn saba and work on stopping smoking   I spent extra time with pt today reviewing appropriate use of albuterol for prn use on exertion with the following points: 1) saba is for relief of sob that does not improve by walking a slower pace or resting but rather if the pt does not improve after trying this first. 2) If the pt is convinced, as many are, that saba helps recover from activity faster then it's easy to tell if this is the case by re-challenging : ie stop, take the inhaler, then p 5 minutes try the exact same activity (intensity of workload) that just caused the symptoms and see if they are substantially diminished or not after saba 3) if there is an activity that reproducibly causes the symptoms, try the saba 15 min before the activity on alternate days   If in fact the saba really does help, then fine to continue to use it prn but advised may need to look closer at the maintenance regimen being used to achieve better control of airways disease with exertion.    Advised:  formulary restrictions will be an ongoing challenge for the forseable future and I would be happy to pick an alternative if the pt will first  provide me a list of them -  pt  will need to return here for training for any new device that is required eg dpi vs hfa vs respimat.    In the meantime we can always provide samples so that the patient never runs out of any needed respiratory medications.   Also advised:  Pt informed of the seriousness of COVID 19 infection as a direct risk to lung health  and safey and to close contacts and should continue to wear a facemask in public and minimize  exposure to public locations but especially avoid any area or activity where non-close contacts are not observing distancing or wearing an appropriate face mask.  I strongly recommended she take either of the vaccines available through local drugstores based on updated information on millions of Americans treated with the [place] products  which have proven both safe and  effective even against the new delta variant.

## 2020-02-29 NOTE — Patient Instructions (Addendum)
Plan A = Automatic = Always=    Breztri Take 2 puffs first thing in am and then another 2 puffs about 12 hours later.   Work on inhaler technique:  relax and gently blow all the way out then take a nice smooth deep breath back in, triggering the inhaler at same time you start breathing in.  Hold for up to 5 seconds if you can. Blow out thru nose. Rinse and gargle with water when done    Plan B = Backup (to supplement plan A, not to replace it) Only use your albuterol inhaler as a rescue medication to be used if you can't catch your breath by resting or doing a relaxed purse lip breathing pattern.  - The less you use it, the better it will work when you need it. - Ok to use the inhaler up to 2 puffs  every 4 hours if you must but call for appointment if use goes up over your usual need - Don't leave home without it !!  (think of it like the spare tire for your car)    Try albuterol 15 min before an activity that you know would make you short of breath and see if it makes any difference and if makes none then don't take it after activity unless you can't catch your breath.  The key is to stop smoking completely before smoking completely stops you!   Please schedule a follow up visit in 3 months but call sooner if needed  - pfts on return

## 2020-03-05 ENCOUNTER — Other Ambulatory Visit: Payer: Self-pay | Admitting: Internal Medicine

## 2020-03-05 DIAGNOSIS — J449 Chronic obstructive pulmonary disease, unspecified: Secondary | ICD-10-CM

## 2020-03-19 ENCOUNTER — Encounter: Payer: Self-pay | Admitting: Cardiovascular Disease

## 2020-03-19 ENCOUNTER — Ambulatory Visit: Payer: Commercial Managed Care - PPO | Admitting: Cardiovascular Disease

## 2020-03-19 ENCOUNTER — Other Ambulatory Visit: Payer: Self-pay

## 2020-03-19 DIAGNOSIS — E782 Mixed hyperlipidemia: Secondary | ICD-10-CM

## 2020-03-19 DIAGNOSIS — Z720 Tobacco use: Secondary | ICD-10-CM | POA: Diagnosis not present

## 2020-03-19 DIAGNOSIS — I739 Peripheral vascular disease, unspecified: Secondary | ICD-10-CM

## 2020-03-19 DIAGNOSIS — I10 Essential (primary) hypertension: Secondary | ICD-10-CM

## 2020-03-19 DIAGNOSIS — I701 Atherosclerosis of renal artery: Secondary | ICD-10-CM

## 2020-03-19 MED ORDER — AMLODIPINE BESYLATE 5 MG PO TABS: 5.0000 mg | ORAL_TABLET | Freq: Every day | ORAL | 3 refills | Status: AC

## 2020-03-19 NOTE — Progress Notes (Signed)
03/19/2020 Connie Farrell   11/24/1960  [id number]  Primary Physician Celene Squibb, MD Primary Cardiologist: Lorretta Harp MD [name], [place]  HPI:  Connie Farrell is a 59 y.o.  thin appearing, married, Caucasian female, mother of two, grandmother to one grandchild, who was initially referred to me by [name] for peripheral vascular evaluation. I last saw her in the office  04/15/2017.  She is accompanied by one of her daughters Connie Farrell today.. She currently sees doctors on call. .  Her cardiac risk factor profile is positive for a 30 pack year of tobacco abuse, currently smoking 0.75 packs per day. Recalcitrant to risk factor modification. I did counsel her for greater than three minutes. Her other problems include hypertension and hyperlipidemia, as well as a strong family history for heart disease. She had a negative Myoview performed by [name] at [facility]. 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 I performed an angiogram on her April 04, 2012, confirming this with a 40-mm pull-back gradient. I stented her left common iliac artery with result in improvement in her left ABI from 0.82 to 0.96. Improvement in her Dopplers and claudication symptoms as well. We have been following her renal Dopplers closely which is usually has shown a significant progression of disease with decline in her pole to pole dimension the dimension from 10.7-9.2 cm an increase in her serum creatinine from 1.2-2.0. Her primary care physician subsequently discontinued her ACE inhibitor.I performed angiography on her 04/22/14 revealing a 60% left renal artery stenosis with a 20 mm Hg gradient. . She also had a widely patent left iliac stent. I decided to treat this medically.  Since I saw her 3 years ago she does continue to smoke.  She  is had progressive claudication left greater than right.  She also has had labile hypertension.  She is scheduled for renal and lower extremity arterial Doppler studies in the next month or so.   Current Meds  Medication Sig  . aspirin EC 81 MG tablet Take 81 mg by mouth daily.  . Budeson-Glycopyrrol-Formoterol (BREZTRI AEROSPHERE) 160-9-4.8 MCG/ACT AERO Inhale 2 puffs into the lungs in the morning and at bedtime. Take 2 puffs first thing in am and then another 2 puffs about 12 hours later.  . clopidogrel (PLAVIX) 75 MG tablet Take 1 tablet (75 mg total) by mouth daily. PLEASE SCHEDULE APPOINTMENT FOR FURTHER REFILLS. 2ND ATTEMPT  . cyclobenzaprine (FLEXERIL) 10 MG tablet Take 5-10 mg by mouth 3 (three) times daily as needed for muscle spasms.   . DEXILANT 60 MG capsule TAKE 1 CAPSULE ONCE DAILY  . escitalopram (LEXAPRO) 10 MG tablet Take 10 mg by mouth Daily.  . fenofibrate 160 MG tablet Take 160 mg by mouth daily.   . folic acid (FOLVITE) 1 MG tablet Take 1 tablet (1 mg total) by mouth daily.  Marland Kitchen MELATONIN PO Take 1 tablet by mouth at bedtime.  . metoprolol tartrate (LOPRESSOR) 25 MG tablet Take 0.5 tablets (12.5 mg total) by mouth 2 (two) times daily as needed.  . Red Yeast Rice 600 MG CAPS Take 1 capsule by mouth 2 (two) times daily.  . rosuvastatin (CRESTOR) 20 MG tablet Take 20 mg by mouth daily.   . vitamin B-12 (CYANOCOBALAMIN) 1000 MCG tablet Take 1 tablet (1,000 mcg total) by  mouth daily.     Allergies  Allergen Reactions  . Latex Rash    Blisters, rash  . Codeine Nausea And Vomiting    Social History   Socioeconomic History  . Marital status: Married    Spouse name: Not on file  . Number of children: Not on file  . Years of education: Not on file  . Highest education level: Not on file  Occupational History  . Occupation: Chiropodist: [employer]    Comment: [address]  Tobacco Use  . Smoking status: Current Every Day Smoker    Packs/day: 1.00     Years: 44.00    Pack years: 44.00    Types: Cigarettes  . Smokeless tobacco: Never Used  Vaping Use  . Vaping Use: Never used  Substance and Sexual Activity  . Alcohol use: No  . Drug use: No  . Sexual activity: Never  Other Topics Concern  . Not on file  Social History Narrative  . Not on file   Social Determinants of Health   Financial Resource Strain:   . Difficulty of Paying Living Expenses: Not on file  Food Insecurity:   . Worried About Charity fundraiser in the Last Year: Not on file  . Ran Out of Food in the Last Year: Not on file  Transportation Needs:   . Lack of Transportation (Medical): Not on file  . Lack of Transportation (Non-Medical): Not on file  Physical Activity:   . Days of Exercise per Week: Not on file  . Minutes of Exercise per Session: Not on file  Stress:   . Feeling of Stress : Not on file  Social Connections:   . Frequency of Communication with Friends and Family: Not on file  . Frequency of Social Gatherings with Friends and Family: Not on file  . Attends Religious Services: Not on file  . Active Member of Clubs or Organizations: Not on file  . Attends Archivist Meetings: Not on file  . Marital Status: Not on file  Intimate Partner Violence:   . Fear of Current or Ex-Partner: Not on file  . Emotionally Abused: Not on file  . Physically Abused: Not on file  . Sexually Abused: Not on file     Review of Systems: General: negative for chills, fever, night sweats or weight changes.  Cardiovascular: negative for chest pain, dyspnea on exertion, edema, orthopnea, palpitations, paroxysmal nocturnal dyspnea or shortness of breath Dermatological: negative for rash Respiratory: negative for cough or wheezing Urologic: negative for hematuria Abdominal: negative for nausea, vomiting, diarrhea, bright red blood per rectum, melena, or hematemesis Neurologic: negative for visual changes, syncope, or dizziness All other systems reviewed  and are otherwise negative except as noted above.    Blood pressure (!) 140/92, pulse 72, height 5\' 4"  (1.626 m), weight 142 lb (64.4 kg).  General appearance: alert and no distress Neck: no adenopathy, no carotid bruit, no JVD, supple, symmetrical, trachea midline and thyroid not enlarged, symmetric, no tenderness/mass/nodules Lungs: clear to auscultation bilaterally Heart: regular rate and rhythm, S1, S2 normal, no murmur, click, rub or gallop Extremities: extremities normal, atraumatic, no cyanosis or edema Pulses: 2+ and symmetric Skin: Skin color, texture, turgor normal. No rashes or lesions Neurologic: Alert and oriented X 3, normal strength and tone. Normal symmetric reflexes. Normal coordination and gait  EKG not performed today  ASSESSMENT AND PLAN:   PAD (peripheral artery disease) History of PAD status post  left common iliac artery stenting by myself 04/22/2014.  At the time I did find a 60% left renal artery stenosis with a 20 mm gradient although I left it alone.  Since I saw her 3 years ago she is developed recurrent claudication.  She does continue to smoke.  She has lower extremity arterial Doppler studies scheduled on October 5 after which I will see her, evaluate and determine whether she needs repeat intervention.  HTN (hypertension) History of essential hypertension a blood pressure measured today 140/92.  She is on metoprolol 25 mg p.o. twice daily.  She does check her blood pressure at home and it at times is significantly higher than this.  I am going to add amlodipine 5 mg a day and we will ask her to keep a 30-day blood pressure log.  She will see a Pharm.D. back after that to review make appropriate adjustments.  She did have a 60% left renal artery stenosis at the time of angiography 6 years ago.  This may have progressed contributing to her hypertension.  We will recheck renal Doppler studies.  Tobacco abuse Continue tobacco abuse of three quarters of a pack a day  unchanged from last time I saw her recalcitrant risk factor modification.  HLD (hyperlipidemia) Patient hyperlipidemia on statin therapy and fenofibrate with lipid profile performed 07/11/2019 revealing total cholesterol 122, LDL 59 HDL 33.   Renal artery stenosis History of 60% left renal artery stenosis demonstrated by angiography 04/22/2014 followed by renal Doppler study.  Her most recent renal duplex was performed 04/23/2019 which revealed a left RAR 4.6 with a pole-to-pole dimension of 8.9 cm.  We will recheck renal Doppler studies.      Lorretta Harp MD FACP,FACC,FAHA, [facility] 03/19/2020 10:52 AM

## 2020-03-19 NOTE — Assessment & Plan Note (Signed)
History of PAD status post left common iliac artery stenting by myself 04/22/2014.  At the time I did find a 60% left renal artery stenosis with a 20 mm gradient although I left it alone.  Since I saw her 3 years ago she is developed recurrent claudication.  She does continue to smoke.  She has lower extremity arterial Doppler studies scheduled on October 5 after which I will see her, evaluate and determine whether she needs repeat intervention.

## 2020-03-19 NOTE — Patient Instructions (Addendum)
Medication Instructions:  Start Amlodipine 5 mg daily   *If you need a refill on your cardiac medications before your next appointment, please call your pharmacy*    Follow-Up: At [facility], you and your health needs are our priority.  As part of our continuing mission to provide you with exceptional heart care, we have created designated Provider Care Teams.  These Care Teams include your primary Cardiologist (physician) and Advanced Practice Providers (APPs -  Physician Assistants and Nurse Practitioners) who all work together to provide you with the care you need, when you need it.  We recommend signing up for the patient portal called "MyChart".  Sign up information is provided on this After Visit Summary.  MyChart is used to connect with patients for Virtual Visits (Telemedicine).  Patients are able to view lab/test results, encounter notes, upcoming appointments, etc.  Non-urgent messages can be sent to your provider as well.   To learn more about what you can do with MyChart, go to [email or web address].    Your next appointment:   6 week(s)  The format for your next appointment:   In Person  Provider:   Quay Burow, MD   Other Instructions  Keep log for 30 day- then see PharmD in 4 weeks.

## 2020-03-19 NOTE — Assessment & Plan Note (Signed)
History of 60% left renal artery stenosis demonstrated by angiography 04/22/2014 followed by renal Doppler study.  Her most recent renal duplex was performed 04/23/2019 which revealed a left RAR 4.6 with a pole-to-pole dimension of 8.9 cm.  We will recheck renal Doppler studies.

## 2020-03-19 NOTE — Assessment & Plan Note (Signed)
Continue tobacco abuse of three quarters of a pack a day unchanged from last time I saw her recalcitrant risk factor modification.

## 2020-03-19 NOTE — Assessment & Plan Note (Signed)
History of essential hypertension a blood pressure measured today 140/92.  She is on metoprolol 25 mg p.o. twice daily.  She does check her blood pressure at home and it at times is significantly higher than this.  I am going to add amlodipine 5 mg a day and we will ask her to keep a 30-day blood pressure log.  She will see a Pharm.D. back after that to review make appropriate adjustments.  She did have a 60% left renal artery stenosis at the time of angiography 6 years ago.  This may have progressed contributing to her hypertension.  We will recheck renal Doppler studies.

## 2020-03-19 NOTE — Assessment & Plan Note (Signed)
Patient hyperlipidemia on statin therapy and fenofibrate with lipid profile performed 07/11/2019 revealing total cholesterol 122, LDL 59 HDL 33.

## 2020-04-03 ENCOUNTER — Ambulatory Visit (HOSPITAL_COMMUNITY)
Admission: RE | Admit: 2020-04-03 | Discharge: 2020-04-03 | Disposition: A | Discharge status: Home / Self Care | Payer: Commercial Managed Care - PPO | Source: Ambulatory Visit | Attending: Nurse Practitioner | Admitting: Nurse Practitioner

## 2020-04-03 ENCOUNTER — Inpatient Hospital Stay (HOSPITAL_COMMUNITY): Payer: Commercial Managed Care - PPO | Attending: Hematology

## 2020-04-03 ENCOUNTER — Other Ambulatory Visit: Payer: Self-pay

## 2020-04-03 DIAGNOSIS — I739 Peripheral vascular disease, unspecified: Secondary | ICD-10-CM | POA: Diagnosis not present

## 2020-04-03 DIAGNOSIS — Z7982 Long term (current) use of aspirin: Secondary | ICD-10-CM | POA: Insufficient documentation

## 2020-04-03 DIAGNOSIS — M1990 Unspecified osteoarthritis, unspecified site: Secondary | ICD-10-CM | POA: Insufficient documentation

## 2020-04-03 DIAGNOSIS — D509 Iron deficiency anemia, unspecified: Secondary | ICD-10-CM | POA: Insufficient documentation

## 2020-04-03 DIAGNOSIS — R197 Diarrhea, unspecified: Secondary | ICD-10-CM | POA: Diagnosis not present

## 2020-04-03 DIAGNOSIS — E611 Iron deficiency: Secondary | ICD-10-CM

## 2020-04-03 DIAGNOSIS — R200 Anesthesia of skin: Secondary | ICD-10-CM | POA: Diagnosis not present

## 2020-04-03 DIAGNOSIS — F17210 Nicotine dependence, cigarettes, uncomplicated: Secondary | ICD-10-CM | POA: Diagnosis not present

## 2020-04-03 DIAGNOSIS — R05 Cough: Secondary | ICD-10-CM | POA: Insufficient documentation

## 2020-04-03 DIAGNOSIS — I10 Essential (primary) hypertension: Secondary | ICD-10-CM | POA: Insufficient documentation

## 2020-04-03 DIAGNOSIS — R918 Other nonspecific abnormal finding of lung field: Secondary | ICD-10-CM | POA: Insufficient documentation

## 2020-04-03 DIAGNOSIS — K219 Gastro-esophageal reflux disease without esophagitis: Secondary | ICD-10-CM | POA: Diagnosis not present

## 2020-04-03 DIAGNOSIS — R5383 Other fatigue: Secondary | ICD-10-CM | POA: Insufficient documentation

## 2020-04-03 DIAGNOSIS — Z79899 Other long term (current) drug therapy: Secondary | ICD-10-CM | POA: Diagnosis not present

## 2020-04-03 DIAGNOSIS — R911 Solitary pulmonary nodule: Secondary | ICD-10-CM | POA: Insufficient documentation

## 2020-04-03 LAB — CBC WITH DIFFERENTIAL/PLATELET
Abs Immature Granulocytes: 0.03 10*3/uL (ref 0.00–0.07)
Basophils Absolute: 0 10*3/uL (ref 0.0–0.1)
Basophils Relative: 1 %
Eosinophils Absolute: 0.2 10*3/uL (ref 0.0–0.5)
Eosinophils Relative: 2 %
HCT: 45.3 % (ref 36.0–46.0)
Hemoglobin: 14.2 g/dL (ref 12.0–15.0)
Immature Granulocytes: 1 %
Lymphocytes Relative: 19 %
Lymphs Abs: 1.2 10*3/uL (ref 0.7–4.0)
MCH: 31 pg (ref 26.0–34.0)
MCHC: 31.3 g/dL (ref 30.0–36.0)
MCV: 98.9 fL (ref 80.0–100.0)
Monocytes Absolute: 0.3 10*3/uL (ref 0.1–1.0)
Monocytes Relative: 5 %
Neutro Abs: 4.5 10*3/uL (ref 1.7–7.7)
Neutrophils Relative %: 72 %
Platelets: 247 10*3/uL (ref 150–400)
RBC: 4.58 MIL/uL (ref 3.87–5.11)
RDW: 14.4 % (ref 11.5–15.5)
WBC: 6.2 10*3/uL (ref 4.0–10.5)
nRBC: 0 % (ref 0.0–0.2)

## 2020-04-03 LAB — IRON AND TIBC
Iron: 58 ug/dL (ref 28–170)
Saturation Ratios: 13 % (ref 10.4–31.8)
TIBC: 442 ug/dL (ref 250–450)
UIBC: 384 ug/dL

## 2020-04-03 LAB — COMPREHENSIVE METABOLIC PANEL
ALT: 11 U/L (ref 0–44)
AST: 12 U/L — ABNORMAL LOW (ref 15–41)
Albumin: 3.5 g/dL (ref 3.5–5.0)
Alkaline Phosphatase: 73 U/L (ref 38–126)
Anion gap: 9 (ref 5–15)
BUN: 12 mg/dL (ref 6–20)
CO2: 25 mmol/L (ref 22–32)
Calcium: 9.4 mg/dL (ref 8.9–10.3)
Chloride: 102 mmol/L (ref 98–111)
Creatinine, Ser: 0.96 mg/dL (ref 0.44–1.00)
GFR calc Af Amer: >60 mL/min (ref >60)
GFR calc non Af Amer: >60 mL/min (ref >60)
Glucose, Bld: 79 mg/dL (ref 70–99)
Potassium: 4.3 mmol/L (ref 3.5–5.1)
Sodium: 136 mmol/L (ref 135–145)
Total Bilirubin: 0.6 mg/dL (ref 0.3–1.2)
Total Protein: 6.9 g/dL (ref 6.5–8.1)

## 2020-04-03 LAB — FERRITIN: Ferritin: 101 ng/mL (ref 11–307)

## 2020-04-03 LAB — VITAMIN D 25 HYDROXY (VIT D DEFICIENCY, FRACTURES): Vit D, 25-Hydroxy: 19.7 ng/mL — ABNORMAL LOW (ref 30–100)

## 2020-04-03 LAB — VITAMIN B12: Vitamin B-12: 924 pg/mL — ABNORMAL HIGH (ref 180–914)

## 2020-04-03 LAB — LACTATE DEHYDROGENASE: LDH: 160 U/L (ref 98–192)

## 2020-04-04 ENCOUNTER — Encounter (HOSPITAL_COMMUNITY): Payer: Self-pay | Admitting: *Deleted

## 2020-04-04 NOTE — Progress Notes (Signed)
Critical value alert:   Received a call from [place] at [facility] with results from patient's Lung nodule follow up CT yesterday.  The results below have been given to the provider.  She is seeing the patient next week and will review with the patient at that time.    IMPRESSION: 1. Lung-RADS 4B, suspicious. Additional imaging evaluation or consultation with Pulmonology or Thoracic Surgery recommended. Further enlargement of the inferior right upper lobe pulmonary nodule, suspicious for primary bronchogenic carcinoma. 2. New anterior left lower lobe consolidation without air bronchograms. This is favored to represent an area of infection. Consider follow-up CT at 6-12 weeks, after antibiotic therapy. 3. Development of patchy ground-glass and subtle centrilobular micronodularity. The latter finding is likely related to respiratory bronchiolitis. The former could represent atypical infection or also be smoking related (desquamitive interstitial pneumonitis). 4. No thoracic adenopathy. 5. Aortic atherosclerosis (ICD10-I70.0) and emphysema (ICD10-J43.9). 6. Age advanced coronary artery atherosclerosis. Recommend assessment of coronary risk factors and consideration of medical therapy.

## 2020-04-07 ENCOUNTER — Other Ambulatory Visit (HOSPITAL_COMMUNITY): Payer: Self-pay | Admitting: *Deleted

## 2020-04-07 ENCOUNTER — Other Ambulatory Visit: Payer: Self-pay | Admitting: *Deleted

## 2020-04-07 DIAGNOSIS — R911 Solitary pulmonary nodule: Secondary | ICD-10-CM

## 2020-04-07 NOTE — Progress Notes (Signed)
Orders placed for PET scan and PFT's per [facility] office prior to them scheduling her.  Schedulers will call patient with appts.

## 2020-04-10 ENCOUNTER — Other Ambulatory Visit: Payer: Self-pay

## 2020-04-10 ENCOUNTER — Inpatient Hospital Stay (HOSPITAL_COMMUNITY): Payer: Commercial Managed Care - PPO | Admitting: Nurse Practitioner

## 2020-04-10 DIAGNOSIS — E611 Iron deficiency: Secondary | ICD-10-CM | POA: Diagnosis not present

## 2020-04-10 DIAGNOSIS — D509 Iron deficiency anemia, unspecified: Secondary | ICD-10-CM | POA: Diagnosis not present

## 2020-04-10 NOTE — Assessment & Plan Note (Addendum)
1.  Iron deficiency state: -She has received intermittent iron from 2015 through 2018. -Last Feraheme infusion was on 12/28/2018. -She denies any bright red bleeding per rectum or melena.  She does report daily fatigue. -Vitamin B12 level has been elevated.  She was on B12 injections which we are holding at the moment. -She also has elevated hemoglobin may be secondary to smoking.  Patient was educated on the harmful risk of smoking and advised her to stop.  She has not stop smoking yet. -Labs done on 04/03/2020 showed hemoglobin 14.2, ferritin 101, percent saturation 13 -I do not recommend any IV iron at this time. -She will follow-up in 3 months with repeat labs.  2.  Smoking history/new lung mass: -She smokes 1 pack/day for more than 50 years. -CT of the chest on 04/13/2018 shows lung RADS 2. -Low-dose CT on 10/01/2019 shows lung RADS 3 suggested repeat scan in 6 months. -Low-dose CT scan on 04/03/2020 showed lung RADS 4B suspicious. -Patient has been referred to [name].  PET scan has been ordered. -She will follow-up 2 weeks after biopsy

## 2020-04-10 NOTE — Progress Notes (Signed)
Connie Farrell, [place] [address]   CLINIC:  Medical Oncology/Hematology  PCP:  Celene Squibb, MD [address] [place] [address] [phone]   REASON FOR VISIT: Follow-up for iron deficiency anemia and new lung mass   CURRENT THERAPY: Intermittent iron infusions   INTERVAL HISTORY:  [name] 59 y.o. female returns for routine follow-up for iron deficiency anemia new lung mass.  Patient reports her energy levels are good at this time.  She denies any increased shortness of breath or hemoptysis. Denies any nausea, vomiting, or diarrhea. Denies any new pains. Had not noticed any recent bleeding such as epistaxis, hematuria or hematochezia. Denies recent chest pain on exertion, shortness of breath on minimal exertion, pre-syncopal episodes, or palpitations. Denies any numbness or tingling in hands or feet. Denies any recent fevers, infections, or recent hospitalizations. Patient reports appetite at 50% and energy level at 25%.  She is eating well maintain her weight at this time.     REVIEW OF SYSTEMS:  Review of Systems  Constitutional: Positive for fatigue.  Respiratory: Positive for cough and shortness of breath.   Gastrointestinal: Positive for diarrhea.  Neurological: Positive for numbness.  Hematological: Bruises/bleeds easily.  All other systems reviewed and are negative.    PAST MEDICAL/SURGICAL HISTORY:  Past Medical History:  Diagnosis Date  . Anxiety   . Arthritis    "hands; spine too" (04/04/2012)  . Exertional dyspnea   . Folate deficiency 10/17/2015  . GERD (gastroesophageal reflux disease)   . Hyperlipemia   . Hypertension   . Macrocytosis 10/17/2015  . Neck pain, chronic   . PAD (peripheral artery disease) ([place])   . Renal artery stenosis ([place])   . Seasonal allergies   . Tobacco abuse    Past Surgical History:  Procedure Laterality Date  . ABDOMINAL ANGIOGRAM  04/04/2012   Procedure: ABDOMINAL ANGIOGRAM;   Surgeon: Lorretta Harp, MD;  Location: [facility] CATH LAB;  Service: Cardiovascular;;  . ANTERIOR CERVICAL DECOMP/DISCECTOMY FUSION  ~ 04/2006   C6, 7, T1  . COLONOSCOPY N/A 02/26/2014   Adequate preparation. Minimal anal canal hemorrhoids;otherwise, normal rectumdiminutive polyps in the middescending segment; otherwise, the remainder of colonic mucosaappeared normal. Hyperplastic polyps.  . ESOPHAGOGASTRODUODENOSCOPY N/A 02/26/2014   TKZ:[id number] distal esophagus, query short segment Barrett's. Status post Venia Minks dilation with esophageal biopsy. Abnormal gastric mucosa. Esophageal biopsy negative for Barrett's. Gastric biopsy showed mild chronic gastritis, no H pylori.  Marland Kitchen LAPAROSCOPIC APPENDECTOMY N/A 09/10/2013   Procedure: APPENDECTOMY LAPAROSCOPIC;  Surgeon: Jamesetta So, MD;  Location: [facility] ORS;  Service: General;  Laterality: N/A;  . LOWER EXTREMITY ANGIOGRAM N/A 04/04/2012   Procedure: LOWER EXTREMITY ANGIOGRAM;  Surgeon: Lorretta Harp, MD;  Location[facility] CATH LAB;  Service: Cardiovascular;  Laterality: N/A;  . Venia Minks DILATION N/A 02/26/2014   Procedure: Venia Minks DILATION;  Surgeon: Daneil Dolin, MD;  Location: [facility] ENDO SUITE;  Service: Endoscopy;  Laterality: N/A;  . PERIPHERAL ARTERIAL STENT GRAFT  04/04/2012  . RENAL ANGIOGRAM Left 04/22/2014   Procedure: RENAL ANGIOGRAM;  Surgeon: Lorretta Harp, MD;  Location: [facility] CATH LAB;  Service: Cardiovascular;  Laterality: Left;  . TUBAL LIGATION  1990     SOCIAL HISTORY:  Social History   Socioeconomic History  . Marital status: Married    Spouse name: Not on file  . Number of children: Not on file  . Years of education: Not on file  . Highest education level: Not on  file  Occupational History  . Occupation: Chiropodist: [employer]    Comment: [address]  Tobacco Use  . Smoking status: Current Every Day Smoker    Packs/day: 1.00    Years: 44.00    Pack years: 44.00    Types: Cigarettes  . Smokeless tobacco:  Never Used  Vaping Use  . Vaping Use: Never used  Substance and Sexual Activity  . Alcohol use: No  . Drug use: No  . Sexual activity: Never  Other Topics Concern  . Not on file  Social History Narrative  . Not on file   Social Determinants of Health   Financial Resource Strain:   . Difficulty of Paying Living Expenses: Not on file  Food Insecurity:   . Worried About Charity fundraiser in the Last Year: Not on file  . Ran Out of Food in the Last Year: Not on file  Transportation Needs:   . Lack of Transportation (Medical): Not on file  . Lack of Transportation (Non-Medical): Not on file  Physical Activity:   . Days of Exercise per Week: Not on file  . Minutes of Exercise per Session: Not on file  Stress:   . Feeling of Stress : Not on file  Social Connections:   . Frequency of Communication with Friends and Family: Not on file  . Frequency of Social Gatherings with Friends and Family: Not on file  . Attends Religious Services: Not on file  . Active Member of Clubs or Organizations: Not on file  . Attends Archivist Meetings: Not on file  . Marital Status: Not on file  Intimate Partner Violence:   . Fear of Current or Ex-Partner: Not on file  . Emotionally Abused: Not on file  . Physically Abused: Not on file  . Sexually Abused: Not on file    FAMILY HISTORY:  Family History  Problem Relation Age of Onset  . Leukemia Father   . Cancer Father   . Heart disease Father   . Heart disease Mother   . Heart disease Other   . Arthritis Other   . Asthma Other   . Colon cancer Other        unknown, possibly father    CURRENT MEDICATIONS:  Outpatient Encounter Medications as of 04/10/2020  Medication Sig  . amLODipine (NORVASC) 5 MG tablet Take 1 tablet (5 mg total) by mouth daily.  Marland Kitchen aspirin EC 81 MG tablet Take 81 mg by mouth daily.  . Budeson-Glycopyrrol-Formoterol (BREZTRI AEROSPHERE) 160-9-4.8 MCG/ACT AERO Inhale 2 puffs into the lungs in the morning and  at bedtime. Take 2 puffs first thing in am and then another 2 puffs about 12 hours later.  . clopidogrel (PLAVIX) 75 MG tablet Take 1 tablet (75 mg total) by mouth daily. PLEASE SCHEDULE APPOINTMENT FOR FURTHER REFILLS. 2ND ATTEMPT  . cyclobenzaprine (FLEXERIL) 10 MG tablet Take 5-10 mg by mouth 3 (three) times daily as needed for muscle spasms.   . DEXILANT 60 MG capsule TAKE 1 CAPSULE ONCE DAILY  . escitalopram (LEXAPRO) 10 MG tablet Take 10 mg by mouth Daily.  . fenofibrate 160 MG tablet Take 160 mg by mouth daily.   . folic acid (FOLVITE) 1 MG tablet Take 1 tablet (1 mg total) by mouth daily.  Marland Kitchen MELATONIN PO Take 1 tablet by mouth at bedtime.  . metoprolol tartrate (LOPRESSOR) 25 MG tablet Take 0.5 tablets (12.5 mg total) by mouth 2 (two) times  daily as needed.  . Red Yeast Rice 600 MG CAPS Take 1 capsule by mouth 2 (two) times daily.  . rosuvastatin (CRESTOR) 20 MG tablet Take 20 mg by mouth daily.   . vitamin B-12 (CYANOCOBALAMIN) 1000 MCG tablet Take 1 tablet (1,000 mcg total) by mouth daily.   No facility-administered encounter medications on file as of 04/10/2020.    ALLERGIES:  Allergies  Allergen Reactions  . Latex Rash    Blisters, rash  . Codeine Nausea And Vomiting     PHYSICAL EXAM:  ECOG Performance status: 1  Vitals:   04/10/20 1143  BP: 129/74  Pulse: 80  Resp: 18  Temp: (!) 96.9 F (36.1 C)  SpO2: 98%   Filed Weights   04/10/20 1143  Weight: 142 lb 4.8 oz (64.5 kg)   Physical Exam Constitutional:      Appearance: Normal appearance. She is normal weight.  Cardiovascular:     Rate and Rhythm: Normal rate and regular rhythm.     Heart sounds: Normal heart sounds.  Pulmonary:     Effort: Pulmonary effort is normal.     Breath sounds: Normal breath sounds.  Abdominal:     General: Bowel sounds are normal.     Palpations: Abdomen is soft.  Musculoskeletal:        General: Normal range of motion.  Skin:    General: Skin is warm.  Neurological:      Mental Status: She is alert and oriented to person, place, and time. Mental status is at baseline.  Psychiatric:        Mood and Affect: Mood normal.        Behavior: Behavior normal.        Thought Content: Thought content normal.        Judgment: Judgment normal.      LABORATORY DATA:  I have reviewed the labs as listed.  CBC    Component Value Date/Time   WBC 6.2 04/03/2020 1232   RBC 4.58 04/03/2020 1232   HGB 14.2 04/03/2020 1232   HCT 45.3 04/03/2020 1232   PLT 247 04/03/2020 1232   MCV 98.9 04/03/2020 1232   MCH 31.0 04/03/2020 1232   MCHC 31.3 04/03/2020 1232   RDW 14.4 04/03/2020 1232   LYMPHSABS 1.2 04/03/2020 1232   MONOABS 0.3 04/03/2020 1232   EOSABS 0.2 04/03/2020 1232   BASOSABS 0.0 04/03/2020 1232   CMP Latest Ref Rng & Units 04/03/2020 10/01/2019 07/02/2019  Glucose 70 - 99 mg/dL 79 65(L) 124(H)  BUN 6 - 20 mg/dL 12 14 16   Creatinine 0.44 - 1.00 mg/dL 0.96 0.99 1.10(H)  Sodium 135 - 145 mmol/L 136 136 139  Potassium 3.5 - 5.1 mmol/L 4.3 4.3 4.2  Chloride 98 - 111 mmol/L 102 106 110  CO2 22 - 32 mmol/L 25 21(L) 20(L)  Calcium 8.9 - 10.3 mg/dL 9.4 9.3 9.5  Total Protein 6.5 - 8.1 g/dL 6.9 7.5 7.4  Total Bilirubin 0.3 - 1.2 mg/dL 0.6 0.5 0.3  Alkaline Phos 38 - 126 U/L 73 89 89  AST 15 - 41 U/L 12(L) 19 15  ALT 0 - 44 U/L 11 22 16     All questions were answered to patient's stated satisfaction. Encouraged patient to call with any new concerns or questions before his next visit to the cancer center and we can certain see him sooner, if needed.     ASSESSMENT & PLAN:  Iron deficiency 1.  Iron deficiency state: -She has received intermittent iron  from 2015 through 2018. -Last Feraheme infusion was on 12/28/2018. -She denies any bright red bleeding per rectum or melena.  She does report daily fatigue. -Vitamin B12 level has been elevated.  She was on B12 injections which we are holding at the moment. -She also has elevated hemoglobin may be secondary to  smoking.  Patient was educated on the harmful risk of smoking and advised her to stop.  She has not stop smoking yet. -Labs done on 04/03/2020 showed hemoglobin 14.2, ferritin 101, percent saturation 13 -I do not recommend any IV iron at this time. -She will follow-up in 3 months with repeat labs.  2.  Smoking history/new lung mass: -She smokes 1 pack/day for more than 50 years. -CT of the chest on 04/13/2018 shows lung RADS 2. -Low-dose CT on 10/01/2019 shows lung RADS 3 suggested repeat scan in 6 months. -Low-dose CT scan on 04/03/2020 showed lung RADS 4B suspicious. -Patient has been referred to [name].  PET scan has been ordered. -She will follow-up 2 weeks after biopsy     Orders placed this encounter:  No orders of the defined types were placed in this encounter.     Francene Finders, FNP-C [place] [phone]

## 2020-04-14 ENCOUNTER — Encounter (HOSPITAL_COMMUNITY)
Admission: RE | Admit: 2020-04-14 | Discharge: 2020-04-14 | Disposition: A | Discharge status: Home / Self Care | Payer: Commercial Managed Care - PPO | Source: Ambulatory Visit | Attending: Hematology | Admitting: Hematology

## 2020-04-14 ENCOUNTER — Other Ambulatory Visit: Payer: Self-pay

## 2020-04-14 DIAGNOSIS — R911 Solitary pulmonary nodule: Secondary | ICD-10-CM | POA: Insufficient documentation

## 2020-04-14 MED ORDER — FLUDEOXYGLUCOSE F - 18 (FDG) INJECTION
7.9900 | Freq: Once | INTRAVENOUS | Status: AC | PRN
  Administered 2020-04-14: 7.99 via INTRAVENOUS

## 2020-04-22 ENCOUNTER — Other Ambulatory Visit (HOSPITAL_COMMUNITY): Payer: Self-pay | Admitting: Cardiovascular Disease

## 2020-04-22 ENCOUNTER — Ambulatory Visit (HOSPITAL_COMMUNITY)
Admission: RE | Admit: 2020-04-22 | Discharge: 2020-04-22 | Disposition: A | Discharge status: Home / Self Care | Payer: Commercial Managed Care - PPO | Source: Ambulatory Visit | Attending: Cardiovascular Disease | Admitting: Cardiovascular Disease

## 2020-04-22 ENCOUNTER — Institutional Professional Consult (permissible substitution): Payer: Commercial Managed Care - PPO | Admitting: Thoracic Surgery (Cardiothoracic Vascular Surgery)

## 2020-04-22 ENCOUNTER — Other Ambulatory Visit: Payer: Self-pay

## 2020-04-22 ENCOUNTER — Encounter: Payer: Self-pay | Admitting: Thoracic Surgery (Cardiothoracic Vascular Surgery)

## 2020-04-22 ENCOUNTER — Ambulatory Visit (HOSPITAL_BASED_OUTPATIENT_CLINIC_OR_DEPARTMENT_OTHER)
Admission: RE | Admit: 2020-04-22 | Discharge: 2020-04-22 | Disposition: A | Discharge status: Home / Self Care | Payer: Commercial Managed Care - PPO | Source: Ambulatory Visit | Attending: Cardiovascular Disease | Admitting: Cardiovascular Disease

## 2020-04-22 VITALS — BP 148/88 | HR 74 | Temp 97.7°F | Resp 18 | Ht 64.0 in | Wt 141.2 lb

## 2020-04-22 DIAGNOSIS — I739 Peripheral vascular disease, unspecified: Secondary | ICD-10-CM | POA: Insufficient documentation

## 2020-04-22 DIAGNOSIS — I701 Atherosclerosis of renal artery: Secondary | ICD-10-CM

## 2020-04-22 DIAGNOSIS — Z95828 Presence of other vascular implants and grafts: Secondary | ICD-10-CM | POA: Diagnosis not present

## 2020-04-22 DIAGNOSIS — R911 Solitary pulmonary nodule: Secondary | ICD-10-CM | POA: Diagnosis not present

## 2020-04-22 NOTE — Progress Notes (Signed)
PCP is Celene Squibb, MD Referring Provider is Derek Jack, MD  Chief Complaint  Patient presents with  . Lung Lesion    new patient consultation, Chest Ct 04/03/20    HPI: [name] is sent for consultation regarding lung nodule.  Connie Farrell is a 59 year old woman with a history of tobacco abuse, hypertension, hyperlipidemia, atherosclerotic cardiovascular disease, iliac stent, renal artery stenosis, reflux, arthritis, macrocytic anemia, and anxiety.  She was found to have a lung nodule on a low-dose screening CT in September 2019.  She has been followed since that time.  Her most recent CT was on 04/03/2020.  There was further enlargement of a nodule in the inferior right upper lobe to 11 mm.  There also was a stable 8 mm subsolid nodule in the right middle lobe.  There was a new area of consolidation in the anterior left lower lobe.  A PET/CT showed the right upper lobe nodule was hypermetabolic with an SUV of 5.6.  There was no significant activity in the middle lobe nodule.  There was marked hypermetabolic activity in the anterior left lower lobe with an SUV of 11.7.  There was some activity in the left hilum as well as a paraesophageal node.  She complains of some loss of appetite and a 5 pound weight loss over the past 3 months.  She also complains of decreased energy.  She gets short of breath with relatively mild exertion.  She has had some issues with dizzy spells but no syncope.  She does have a cough and wheezing.  She does bruise easily.  She is on Plavix for an iliac stent.  Past Medical History:  Diagnosis Date  . Anxiety   . Arthritis    "hands; spine too" (04/04/2012)  . Exertional dyspnea   . Folate deficiency 10/17/2015  . GERD (gastroesophageal reflux disease)   . Hyperlipemia   . Hypertension   . Macrocytosis 10/17/2015  . Neck pain, chronic   . PAD (peripheral artery disease) ([place])   . Renal artery stenosis ([place])   . Seasonal allergies   . Tobacco abuse      Past Surgical History:  Procedure Laterality Date  . ABDOMINAL ANGIOGRAM  04/04/2012   Procedure: ABDOMINAL ANGIOGRAM;  Surgeon: Lorretta Harp, MD;  Location: [facility] CATH LAB;  Service: Cardiovascular;;  . ANTERIOR CERVICAL DECOMP/DISCECTOMY FUSION  ~ 04/2006   C6, 7, T1  . COLONOSCOPY N/A 02/26/2014   Adequate preparation. Minimal anal canal hemorrhoids;otherwise, normal rectumdiminutive polyps in the middescending segment; otherwise, the remainder of colonic mucosaappeared normal. Hyperplastic polyps.  . ESOPHAGOGASTRODUODENOSCOPY N/A 02/26/2014   ZTI:[id number] distal esophagus, query short segment Barrett's. Status post Venia Minks dilation with esophageal biopsy. Abnormal gastric mucosa. Esophageal biopsy negative for Barrett's. Gastric biopsy showed mild chronic gastritis, no H pylori.  Marland Kitchen LAPAROSCOPIC APPENDECTOMY N/A 09/10/2013   Procedure: APPENDECTOMY LAPAROSCOPIC;  Surgeon: Jamesetta So, MD;  Location: [facility] ORS;  Service: General;  Laterality: N/A;  . LOWER EXTREMITY ANGIOGRAM N/A 04/04/2012   Procedure: LOWER EXTREMITY ANGIOGRAM;  Surgeon: Lorretta Harp, MD;  Location: [facility] CATH LAB;  Service: Cardiovascular;  Laterality: N/A;  . Venia Minks DILATION N/A 02/26/2014   Procedure: Venia Minks DILATION;  Surgeon: Daneil Dolin, MD;  Location: [facility] ENDO SUITE;  Service: Endoscopy;  Laterality: N/A;  . PERIPHERAL ARTERIAL STENT GRAFT  04/04/2012  . RENAL ANGIOGRAM Left 04/22/2014   Procedure: RENAL ANGIOGRAM;  Surgeon: Lorretta Harp, MD;  Location: [facility] CATH LAB;  Service: Cardiovascular;  Laterality:  Left;  . TUBAL LIGATION  1990    Family History  Problem Relation Age of Onset  . Leukemia Father   . Cancer Father   . Heart disease Father   . Heart disease Mother   . Heart disease Other   . Arthritis Other   . Asthma Other   . Colon cancer Other        unknown, possibly father    Social History Social History   Tobacco Use  . Smoking status: Current Every Day Smoker    Packs/day: 1.00     Years: 44.00    Pack years: 44.00    Types: Cigarettes  . Smokeless tobacco: Never Used  Vaping Use  . Vaping Use: Never used  Substance Use Topics  . Alcohol use: No  . Drug use: No    Current Outpatient Medications  Medication Sig Dispense Refill  . amLODipine (NORVASC) 5 MG tablet Take 1 tablet (5 mg total) by mouth daily. 180 tablet 3  . aspirin EC 81 MG tablet Take 81 mg by mouth daily.    . Budeson-Glycopyrrol-Formoterol (BREZTRI AEROSPHERE) 160-9-4.8 MCG/ACT AERO Inhale 2 puffs into the lungs in the morning and at bedtime. Take 2 puffs first thing in am and then another 2 puffs about 12 hours later. 10.7 g 11  . clopidogrel (PLAVIX) 75 MG tablet Take 1 tablet (75 mg total) by mouth daily. PLEASE SCHEDULE APPOINTMENT FOR FURTHER REFILLS. 2ND ATTEMPT 15 tablet 0  . DEXILANT 60 MG capsule TAKE 1 CAPSULE ONCE DAILY 90 capsule 1  . escitalopram (LEXAPRO) 10 MG tablet Take 10 mg by mouth Daily.    . fenofibrate 160 MG tablet Take 160 mg by mouth daily.     . folic acid (FOLVITE) 1 MG tablet Take 1 tablet (1 mg total) by mouth daily. 90 tablet 4  . MELATONIN PO Take 1 tablet by mouth at bedtime.    . metoprolol tartrate (LOPRESSOR) 25 MG tablet Take 0.5 tablets (12.5 mg total) by mouth 2 (two) times daily as needed. 90 tablet 2  . Red Yeast Rice 600 MG CAPS Take 1 capsule by mouth 2 (two) times daily.    . rosuvastatin (CRESTOR) 20 MG tablet Take 20 mg by mouth daily.     . vitamin B-12 (CYANOCOBALAMIN) 1000 MCG tablet Take 1 tablet (1,000 mcg total) by mouth daily. 60 tablet 2   No current facility-administered medications for this visit.    Allergies  Allergen Reactions  . Latex Rash    Blisters, rash  . Codeine Nausea And Vomiting    Review of Systems  Constitutional: Positive for fatigue and unexpected weight change. Negative for activity change, chills and fever.  HENT: Negative for trouble swallowing and voice change.   Eyes: Positive for visual disturbance.   Respiratory: Positive for cough, shortness of breath and wheezing.   Cardiovascular: Negative for chest pain.       Claudication  Gastrointestinal: Positive for diarrhea.  Genitourinary: Positive for frequency.  Musculoskeletal: Positive for arthralgias and joint swelling.  Neurological: Positive for dizziness. Negative for syncope.  Hematological: Negative for adenopathy. Bruises/bleeds easily.  Psychiatric/Behavioral: The patient is nervous/anxious.   All other systems reviewed and are negative.   BP (!) 148/88 (BP Location: Left Arm, Patient Position: Sitting, Cuff Size: Normal)   Pulse 74   Temp 97.7 F (36.5 C)   Resp 18   Ht 5\' 4"  (1.626 m)   Wt 141 lb 3.2 oz (64 kg)   SpO2  93% Comment: RA  BMI 24.24 kg/m  Physical Exam Vitals reviewed.  Constitutional:      General: She is not in acute distress.    Appearance: Normal appearance.  HENT:     Head: Normocephalic and atraumatic.  Eyes:     General: No scleral icterus.    Extraocular Movements: Extraocular movements intact.  Cardiovascular:     Rate and Rhythm: Normal rate and regular rhythm.     Heart sounds: Normal heart sounds. No murmur heard.   Pulmonary:     Effort: Pulmonary effort is normal. No respiratory distress.     Breath sounds: No wheezing or rales.     Comments: Diminished but equal breath sounds bilaterally Abdominal:     General: There is no distension.     Palpations: Abdomen is soft.     Tenderness: There is no abdominal tenderness.  Musculoskeletal:        General: No swelling.     Cervical back: Neck supple.  Lymphadenopathy:     Cervical: No cervical adenopathy.  Skin:    General: Skin is warm and dry.  Neurological:     General: No focal deficit present.     Mental Status: She is alert and oriented to person, place, and time.     Cranial Nerves: No cranial nerve deficit.     Motor: No weakness.     Gait: Gait normal.      Diagnostic Tests: CT CHEST WITHOUT CONTRAST FOR LUNG  CANCER SCREENING NODULE FOLLOW-UP  TECHNIQUE: Multidetector CT imaging of the chest was performed following the standard protocol without IV contrast.  COMPARISON:  10/01/2019  FINDINGS: Cardiovascular: Aortic atherosclerosis. Tortuous thoracic aorta. Borderline cardiomegaly, without pericardial effusion. Multivessel coronary artery atherosclerosis.  Mediastinum/Nodes: No mediastinal or definite hilar adenopathy, given limitations of unenhanced CT.  Lungs/Pleura: No pleural fluid. Moderate centrilobular emphysema. The nodule of interest, within the inferior right upper lobe, has further enlarged. Example at volume derived equivalent diameter 11.0 mm on 114/4 versus volume derived equivalent diameter 8.2 mm on the prior exam.  There is also a new area of anterior left lower lobe consolidation without air bronchograms. Example 167/4.  Smaller pulmonary nodules are similar, including at volume derived equivalent diameter 8.1 mm within the right middle lobe.  Development of heterogeneous diffuse ground-glass and ill-defined centrilobular micronodularity.  Upper Abdomen: Normal imaged portions of the liver, spleen, stomach, pancreas, left kidney. Bilateral adrenal nodularity is similar and low-density, likely due to adenomas.  Musculoskeletal: Lower cervical spine fixation.  Mild osteopenia.  IMPRESSION: 1. Lung-RADS 4B, suspicious. Additional imaging evaluation or consultation with Pulmonology or Thoracic Surgery recommended. Further enlargement of the inferior right upper lobe pulmonary nodule, suspicious for primary bronchogenic carcinoma. 2. New anterior left lower lobe consolidation without air bronchograms. This is favored to represent an area of infection. Consider follow-up CT at 6-12 weeks, after antibiotic therapy. 3. Development of patchy ground-glass and subtle centrilobular micronodularity. The latter finding is likely related to  respiratory bronchiolitis. The former could represent atypical infection or also be smoking related (desquamitive interstitial pneumonitis). 4. No thoracic adenopathy. 5. Aortic atherosclerosis (ICD10-I70.0) and emphysema (ICD10-J43.9). 6. Age advanced coronary artery atherosclerosis. Recommend assessment of coronary risk factors and consideration of medical therapy.  These results will be called to the ordering clinician or representative by the Radiologist Assistant, and communication documented in the PACS or [employer].   Electronically Signed   By: Abigail Miyamoto M.D.   On: 04/03/2020 16:30  NUCLEAR MEDICINE PET  SKULL BASE TO THIGH  TECHNIQUE: 8.0 mCi F-18 FDG was injected intravenously. Full-ring PET imaging was performed from the skull base to thigh after the radiotracer. CT data was obtained and used for attenuation correction and anatomic localization.  Fasting blood glucose: 102 mg/dl  COMPARISON:  Lung cancer screening chest CT 04/03/2020  FINDINGS: Mediastinal blood pool activity: SUV max 2.4  Liver activity: SUV max NA  NECK: No hypermetabolic lymph nodes in the neck.  Incidental CT findings: none  CHEST: Right upper lobe pulmonary nodule of concern on recent lung cancer screening chest CT is hypermetabolic with SUV max = 5.6.  Dense masslike consolidative opacity in the anterior left lower lobe is also markedly hypermetabolic with SUV max = 51.8. A hypermetabolic nodule seen just medial to this dominant lesion is hypermetabolic but somewhat obscured by breathing motion on CT imaging.  7 mm short axis paraesophageal lymph node seen on image 88/series 3 shows low level hypermetabolism with SUV max = 4.7. Focal hypermetabolism in the left hilum demonstrates SUV max = 4.0 although no discrete lymph node is visible on today's noncontrast imaging.  Incidental CT findings: Heart size upper normal. Coronary artery calcification is  evident. Atherosclerotic calcification is noted in the wall of the thoracic aorta.  ABDOMEN/PELVIS: No abnormal hypermetabolic activity within the liver, pancreas, adrenal glands, or spleen. No hypermetabolic lymph nodes in the abdomen or pelvis.  Incidental CT findings: Diffuse low attenuation of the liver parenchyma is compatible with fatty deposition. Possible tiny layering gallstone (image 165/series 3). 13 mm right adrenal nodule measures -16 Hounsfield units in shows no hypermetabolism, features most consistent with adenoma. There is abdominal aortic atherosclerosis without aneurysm. Left common iliac artery stent device noted. Small left groin hernia contains only fat.  SKELETON: No focal hypermetabolic activity to suggest skeletal metastasis.  Incidental CT findings: none  IMPRESSION: 1. Right upper lobe pulmonary nodule of concern on recent lung cancer screening CT is hypermetabolic, consistent with neoplasm, likely primary. 2. The anterior left lower lobe dense consolidative opacity is also markedly hypermetabolic. This finding was new in the interval between lung cancer screening CT of 04/03/2020 and 10/01/2019. Given the rapid interval appearance, infectious/inflammatory etiology favored, but neoplasm not excluded. There is a small hypermetabolic satellite nodule medial to the dominant lesion. 3. Low level hypermetabolism identified in the left hilum and in a normal sized paraesophageal lymph node. This is somewhat indeterminate given the bilateral lung findings and could be reactive if the left lower lobe process is infectious/inflammatory. 4. 13 mm right adrenal adenoma. 5.  Aortic Atherosclerois (ICD10-170.0) 6.  Emphysema. ([id number])   Electronically Signed   By: Misty Stanley M.D.   On: 04/15/2020 13:17  I personally reviewed the CT and PET/CT images and concur with the findings noted above  Impression: Connie Farrell  is a 59 year old woman with  a history of tobacco abuse, hypertension, hyperlipidemia, atherosclerotic cardiovascular disease, iliac stent, renal artery stenosis, reflux, arthritis, macrocytic anemia, and anxiety.  She was first found to have a right lung nodule back in 2018 on a low-dose screening CT.  She was followed since then.  Her most recent CT in September showed an increase in the size of a solid nodule in the inferior aspect of the right upper lobe and a new consolidation/mass in the left lower lobe.  A right middle lobe subsolid nodule was unchanged.  On PET CT both the right upper lobe nodule and the abnormality in the left lower lobe were markedly hypermetabolic.  Right upper lobe lung nodule-solid nodule is hypermetabolic.  Given her age and smoking history and appearance of the nodule this is almost certainly a primary bronchogenic carcinoma.  Right middle lobe nodule -more subsolid in appearance.  Also concerning for possible low-grade malignancy.  Left lower lobe infiltrate/consolidation/mass-it is totally unclear at this point what this represents.  She does not have any fever or chills.  She has chronic shortness of breath.  She has a chronic cough but that really has not changed in character recently.  This would be a highly unusual presentation for lung cancer but I cannot rule out that possibility.  I think the best option initially is to try to biopsy both the left and right sides,  primarily the left side.  I think the best way to do that is to do a navigational bronchoscopy.  In addition to sampling the area on the left for path and cultures, we will also try to biopsy the right upper and middle lobe nodules.  I described the proposed procedure to Mrs. Lowery and her daughter.  They understand this will be done in the operating room under general anesthesia.  Is an endoscopic procedure and we will plan to do it on an outpatient basis.  There is no guarantee will get a definitive diagnosis.  I informed him  of the indications, risk, benefits, and alternatives.  They understand the risk include associated with general anesthesia and could include things such as heart attack, stroke, blood clots, or in rare cases death.  More procedure specific risks also include bleeding, nondiagnostic biopsy, and pneumothorax.  There also is a possibility of other unforeseeable complications.  She accepts the risk and agrees to proceed.  She will need to be off Plavix for 5 days prior to the procedure.  Tobacco abuse - she has smoked about a pack a day for over 40 years.  She is trying to quit.  Chantix helped with cravings but caused her to "go to a dark place."  Reiterated the need for tobacco cessation.  COPD-has exertional dyspnea.  Some of this may be pulmonary related.  She does have some centrilobular emphysema on CT but is not severe.  We need to check pulmonary function testing with and without bronchodilators.  That currently scheduled for mid November, my office will try to move that up.  Claudication-has an iliac stent.  Being evaluated by [name].  I think evaluation of the lung issues takes precedence.  Plan: We will try to expedite pulmonary function testing Electromagnetic navigational bronchoscopy for sampling of left lower lobe infiltrate and right upper and middle lobe nodules.  [name], MD [facility] [phone]

## 2020-04-22 NOTE — H&P (View-Only) (Signed)
PCP is Celene Squibb, MD Referring Provider is Derek Jack, MD  Chief Complaint  Patient presents with  . Lung Lesion    new patient consultation, Chest Ct 04/03/20    HPI: Connie Farrell is sent for consultation regarding lung nodule.  Connie Farrell is a 59 year old woman with a history of tobacco abuse, hypertension, hyperlipidemia, atherosclerotic cardiovascular disease, iliac stent, renal artery stenosis, reflux, arthritis, macrocytic anemia, and anxiety.  She was found to have a lung nodule on a low-dose screening CT in September 2019.  She has been followed since that time.  Her most recent CT was on 04/03/2020.  There was further enlargement of a nodule in the inferior right upper lobe to 11 mm.  There also was a stable 8 mm subsolid nodule in the right middle lobe.  There was a new area of consolidation in the anterior left lower lobe.  A PET/CT showed the right upper lobe nodule was hypermetabolic with an SUV of 5.6.  There was no significant activity in the middle lobe nodule.  There was marked hypermetabolic activity in the anterior left lower lobe with an SUV of 11.7.  There was some activity in the left hilum as well as a paraesophageal node.  She complains of some loss of appetite and a 5 pound weight loss over the past 3 months.  She also complains of decreased energy.  She gets short of breath with relatively mild exertion.  She has had some issues with dizzy spells but no syncope.  She does have a cough and wheezing.  She does bruise easily.  She is on Plavix for an iliac stent.  Past Medical History:  Diagnosis Date  . Anxiety   . Arthritis    "hands; spine too" (04/04/2012)  . Exertional dyspnea   . Folate deficiency 10/17/2015  . GERD (gastroesophageal reflux disease)   . Hyperlipemia   . Hypertension   . Macrocytosis 10/17/2015  . Neck pain, chronic   . PAD (peripheral artery disease) ([place])   . Renal artery stenosis ([place])   . Seasonal allergies   . Tobacco abuse      Past Surgical History:  Procedure Laterality Date  . ABDOMINAL ANGIOGRAM  04/04/2012   Procedure: ABDOMINAL ANGIOGRAM;  Surgeon: Lorretta Harp, MD;  Location: [facility] CATH LAB;  Service: Cardiovascular;;  . ANTERIOR CERVICAL DECOMP/DISCECTOMY FUSION  ~ 04/2006   C6, 7, T1  . COLONOSCOPY N/A 02/26/2014   Adequate preparation. Minimal anal canal hemorrhoids;otherwise, normal rectumdiminutive polyps in the middescending segment; otherwise, the remainder of colonic mucosaappeared normal. Hyperplastic polyps.  . ESOPHAGOGASTRODUODENOSCOPY N/A 02/26/2014   VOH:[id number] distal esophagus, query short segment Barrett's. Status post Venia Minks dilation with esophageal biopsy. Abnormal gastric mucosa. Esophageal biopsy negative for Barrett's. Gastric biopsy showed mild chronic gastritis, no H pylori.  Marland Kitchen LAPAROSCOPIC APPENDECTOMY N/A 09/10/2013   Procedure: APPENDECTOMY LAPAROSCOPIC;  Surgeon: Jamesetta So, MD;  Location: [facility] ORS;  Service: General;  Laterality: N/A;  . LOWER EXTREMITY ANGIOGRAM N/A 04/04/2012   Procedure: LOWER EXTREMITY ANGIOGRAM;  Surgeon: Lorretta Harp, MD;  Location: [facility] CATH LAB;  Service: Cardiovascular;  Laterality: N/A;  . Venia Minks DILATION N/A 02/26/2014   Procedure: Venia Minks DILATION;  Surgeon: Daneil Dolin, MD;  Location: [facility] ENDO SUITE;  Service: Endoscopy;  Laterality: N/A;  . PERIPHERAL ARTERIAL STENT GRAFT  04/04/2012  . RENAL ANGIOGRAM Left 04/22/2014   Procedure: RENAL ANGIOGRAM;  Surgeon: Lorretta Harp, MD;  Location: [facility] CATH LAB;  Service: Cardiovascular;  Laterality:  Left;  . TUBAL LIGATION  1990    Family History  Problem Relation Age of Onset  . Leukemia Father   . Cancer Father   . Heart disease Father   . Heart disease Mother   . Heart disease Other   . Arthritis Other   . Asthma Other   . Colon cancer Other        unknown, possibly father    Social History Social History   Tobacco Use  . Smoking status: Current Every Day Smoker    Packs/day: 1.00     Years: 44.00    Pack years: 44.00    Types: Cigarettes  . Smokeless tobacco: Never Used  Vaping Use  . Vaping Use: Never used  Substance Use Topics  . Alcohol use: No  . Drug use: No    Current Outpatient Medications  Medication Sig Dispense Refill  . amLODipine (NORVASC) 5 MG tablet Take 1 tablet (5 mg total) by mouth daily. 180 tablet 3  . aspirin EC 81 MG tablet Take 81 mg by mouth daily.    . Budeson-Glycopyrrol-Formoterol (BREZTRI AEROSPHERE) 160-9-4.8 MCG/ACT AERO Inhale 2 puffs into the lungs in the morning and at bedtime. Take 2 puffs first thing in am and then another 2 puffs about 12 hours later. 10.7 g 11  . clopidogrel (PLAVIX) 75 MG tablet Take 1 tablet (75 mg total) by mouth daily. PLEASE SCHEDULE APPOINTMENT FOR FURTHER REFILLS. 2ND ATTEMPT 15 tablet 0  . DEXILANT 60 MG capsule TAKE 1 CAPSULE ONCE DAILY 90 capsule 1  . escitalopram (LEXAPRO) 10 MG tablet Take 10 mg by mouth Daily.    . fenofibrate 160 MG tablet Take 160 mg by mouth daily.     . folic acid (FOLVITE) 1 MG tablet Take 1 tablet (1 mg total) by mouth daily. 90 tablet 4  . MELATONIN PO Take 1 tablet by mouth at bedtime.    . metoprolol tartrate (LOPRESSOR) 25 MG tablet Take 0.5 tablets (12.5 mg total) by mouth 2 (two) times daily as needed. 90 tablet 2  . Red Yeast Rice 600 MG CAPS Take 1 capsule by mouth 2 (two) times daily.    . rosuvastatin (CRESTOR) 20 MG tablet Take 20 mg by mouth daily.     . vitamin B-12 (CYANOCOBALAMIN) 1000 MCG tablet Take 1 tablet (1,000 mcg total) by mouth daily. 60 tablet 2   No current facility-administered medications for this visit.    Allergies  Allergen Reactions  . Latex Rash    Blisters, rash  . Codeine Nausea And Vomiting    Review of Systems  Constitutional: Positive for fatigue and unexpected weight change. Negative for activity change, chills and fever.  HENT: Negative for trouble swallowing and voice change.   Eyes: Positive for visual disturbance.   Respiratory: Positive for cough, shortness of breath and wheezing.   Cardiovascular: Negative for chest pain.       Claudication  Gastrointestinal: Positive for diarrhea.  Genitourinary: Positive for frequency.  Musculoskeletal: Positive for arthralgias and joint swelling.  Neurological: Positive for dizziness. Negative for syncope.  Hematological: Negative for adenopathy. Bruises/bleeds easily.  Psychiatric/Behavioral: The patient is nervous/anxious.   All other systems reviewed and are negative.   BP (!) 148/88 (BP Location: Left Arm, Patient Position: Sitting, Cuff Size: Normal)   Pulse 74   Temp 97.7 F (36.5 C)   Resp 18   Ht 5\' 4"  (1.626 m)   Wt 141 lb 3.2 oz (64 kg)   SpO2  93% Comment: RA  BMI 24.24 kg/m  Physical Exam Vitals reviewed.  Constitutional:      General: She is not in acute distress.    Appearance: Normal appearance.  HENT:     Head: Normocephalic and atraumatic.  Eyes:     General: No scleral icterus.    Extraocular Movements: Extraocular movements intact.  Cardiovascular:     Rate and Rhythm: Normal rate and regular rhythm.     Heart sounds: Normal heart sounds. No murmur heard.   Pulmonary:     Effort: Pulmonary effort is normal. No respiratory distress.     Breath sounds: No wheezing or rales.     Comments: Diminished but equal breath sounds bilaterally Abdominal:     General: There is no distension.     Palpations: Abdomen is soft.     Tenderness: There is no abdominal tenderness.  Musculoskeletal:        General: No swelling.     Cervical back: Neck supple.  Lymphadenopathy:     Cervical: No cervical adenopathy.  Skin:    General: Skin is warm and dry.  Neurological:     General: No focal deficit present.     Mental Status: She is alert and oriented to person, place, and time.     Cranial Nerves: No cranial nerve deficit.     Motor: No weakness.     Gait: Gait normal.      Diagnostic Tests: CT CHEST WITHOUT CONTRAST FOR LUNG  CANCER SCREENING NODULE FOLLOW-UP  TECHNIQUE: Multidetector CT imaging of the chest was performed following the standard protocol without IV contrast.  COMPARISON:  10/01/2019  FINDINGS: Cardiovascular: Aortic atherosclerosis. Tortuous thoracic aorta. Borderline cardiomegaly, without pericardial effusion. Multivessel coronary artery atherosclerosis.  Mediastinum/Nodes: No mediastinal or definite hilar adenopathy, given limitations of unenhanced CT.  Lungs/Pleura: No pleural fluid. Moderate centrilobular emphysema. The nodule of interest, within the inferior right upper lobe, has further enlarged. Example at volume derived equivalent diameter 11.0 mm on 114/4 versus volume derived equivalent diameter 8.2 mm on the prior exam.  There is also a new area of anterior left lower lobe consolidation without air bronchograms. Example 167/4.  Smaller pulmonary nodules are similar, including at volume derived equivalent diameter 8.1 mm within the right middle lobe.  Development of heterogeneous diffuse ground-glass and ill-defined centrilobular micronodularity.  Upper Abdomen: Normal imaged portions of the liver, spleen, stomach, pancreas, left kidney. Bilateral adrenal nodularity is similar and low-density, likely due to adenomas.  Musculoskeletal: Lower cervical spine fixation.  Mild osteopenia.  IMPRESSION: 1. Lung-RADS 4B, suspicious. Additional imaging evaluation or consultation with Pulmonology or Thoracic Surgery recommended. Further enlargement of the inferior right upper lobe pulmonary nodule, suspicious for primary bronchogenic carcinoma. 2. New anterior left lower lobe consolidation without air bronchograms. This is favored to represent an area of infection. Consider follow-up CT at 6-12 weeks, after antibiotic therapy. 3. Development of patchy ground-glass and subtle centrilobular micronodularity. The latter finding is likely related to  respiratory bronchiolitis. The former could represent atypical infection or also be smoking related (desquamitive interstitial pneumonitis). 4. No thoracic adenopathy. 5. Aortic atherosclerosis (ICD10-I70.0) and emphysema (ICD10-J43.9). 6. Age advanced coronary artery atherosclerosis. Recommend assessment of coronary risk factors and consideration of medical therapy.  These results will be called to the ordering clinician or representative by the Radiologist Assistant, and communication documented in the PACS or [employer].   Electronically Signed   By: Abigail Miyamoto M.D.   On: 04/03/2020 16:30  NUCLEAR MEDICINE PET  SKULL BASE TO THIGH  TECHNIQUE: 8.0 mCi F-18 FDG was injected intravenously. Full-ring PET imaging was performed from the skull base to thigh after the radiotracer. CT data was obtained and used for attenuation correction and anatomic localization.  Fasting blood glucose: 102 mg/dl  COMPARISON:  Lung cancer screening chest CT 04/03/2020  FINDINGS: Mediastinal blood pool activity: SUV max 2.4  Liver activity: SUV max NA  NECK: No hypermetabolic lymph nodes in the neck.  Incidental CT findings: none  CHEST: Right upper lobe pulmonary nodule of concern on recent lung cancer screening chest CT is hypermetabolic with SUV max = 5.6.  Dense masslike consolidative opacity in the anterior left lower lobe is also markedly hypermetabolic with SUV max = 09.2. A hypermetabolic nodule seen just medial to this dominant lesion is hypermetabolic but somewhat obscured by breathing motion on CT imaging.  7 mm short axis paraesophageal lymph node seen on image 88/series 3 shows low level hypermetabolism with SUV max = 4.7. Focal hypermetabolism in the left hilum demonstrates SUV max = 4.0 although no discrete lymph node is visible on today's noncontrast imaging.  Incidental CT findings: Heart size upper normal. Coronary artery calcification is  evident. Atherosclerotic calcification is noted in the wall of the thoracic aorta.  ABDOMEN/PELVIS: No abnormal hypermetabolic activity within the liver, pancreas, adrenal glands, or spleen. No hypermetabolic lymph nodes in the abdomen or pelvis.  Incidental CT findings: Diffuse low attenuation of the liver parenchyma is compatible with fatty deposition. Possible tiny layering gallstone (image 165/series 3). 13 mm right adrenal nodule measures -16 Hounsfield units in shows no hypermetabolism, features most consistent with adenoma. There is abdominal aortic atherosclerosis without aneurysm. Left common iliac artery stent device noted. Small left groin hernia contains only fat.  SKELETON: No focal hypermetabolic activity to suggest skeletal metastasis.  Incidental CT findings: none  IMPRESSION: 1. Right upper lobe pulmonary nodule of concern on recent lung cancer screening CT is hypermetabolic, consistent with neoplasm, likely primary. 2. The anterior left lower lobe dense consolidative opacity is also markedly hypermetabolic. This finding was new in the interval between lung cancer screening CT of 04/03/2020 and 10/01/2019. Given the rapid interval appearance, infectious/inflammatory etiology favored, but neoplasm not excluded. There is a small hypermetabolic satellite nodule medial to the dominant lesion. 3. Low level hypermetabolism identified in the left hilum and in a normal sized paraesophageal lymph node. This is somewhat indeterminate given the bilateral lung findings and could be reactive if the left lower lobe process is infectious/inflammatory. 4. 13 mm right adrenal adenoma. 5.  Aortic Atherosclerois (ICD10-170.0) 6.  Emphysema. (ZRA07-M22.9)   Electronically Signed   By: Misty Stanley M.D.   On: 04/15/2020 13:17  I personally reviewed the CT and PET/CT images and concur with the findings noted above  Impression: Connie Farrell  is a 59 year old woman with  a history of tobacco abuse, hypertension, hyperlipidemia, atherosclerotic cardiovascular disease, iliac stent, renal artery stenosis, reflux, arthritis, macrocytic anemia, and anxiety.  She was first found to have a right lung nodule back in 2018 on a low-dose screening CT.  She was followed since then.  Her most recent CT in September showed an increase in the size of a solid nodule in the inferior aspect of the right upper lobe and a new consolidation/mass in the left lower lobe.  A right middle lobe subsolid nodule was unchanged.  On PET CT both the right upper lobe nodule and the abnormality in the left lower lobe were markedly hypermetabolic.  Right upper lobe lung nodule-solid nodule is hypermetabolic.  Given her age and smoking history and appearance of the nodule this is almost certainly a primary bronchogenic carcinoma.  Right middle lobe nodule -more subsolid in appearance.  Also concerning for possible low-grade malignancy.  Left lower lobe infiltrate/consolidation/mass-it is totally unclear at this point what this represents.  She does not have any fever or chills.  She has chronic shortness of breath.  She has a chronic cough but that really has not changed in character recently.  This would be a highly unusual presentation for lung cancer but I cannot rule out that possibility.  I think the best option initially is to try to biopsy both the left and right sides,  primarily the left side.  I think the best way to do that is to do a navigational bronchoscopy.  In addition to sampling the area on the left for path and cultures, we will also try to biopsy the right upper and middle lobe nodules.  I described the proposed procedure to Connie Farrell and her daughter.  They understand this will be done in the operating room under general anesthesia.  Is an endoscopic procedure and we will plan to do it on an outpatient basis.  There is no guarantee will get a definitive diagnosis.  I informed him  of the indications, risk, benefits, and alternatives.  They understand the risk include associated with general anesthesia and could include things such as heart attack, stroke, blood clots, or in rare cases death.  More procedure specific risks also include bleeding, nondiagnostic biopsy, and pneumothorax.  There also is a possibility of other unforeseeable complications.  She accepts the risk and agrees to proceed.  She will need to be off Plavix for 5 days prior to the procedure.  Tobacco abuse - she has smoked about a pack a day for over 40 years.  She is trying to quit.  Chantix helped with cravings but caused her to "go to a dark place."  Reiterated the need for tobacco cessation.  COPD-has exertional dyspnea.  Some of this may be pulmonary related.  She does have some centrilobular emphysema on CT but is not severe.  We need to check pulmonary function testing with and without bronchodilators.  That currently scheduled for mid November, my office will try to move that up.  Claudication-has an iliac stent.  Being evaluated by [name].  I think evaluation of the lung issues takes precedence.  Plan: We will try to expedite pulmonary function testing Electromagnetic navigational bronchoscopy for sampling of left lower lobe infiltrate and right upper and middle lobe nodules.  [name], MD [facility] [phone]

## 2020-04-23 ENCOUNTER — Other Ambulatory Visit: Payer: Self-pay | Admitting: *Deleted

## 2020-04-23 ENCOUNTER — Other Ambulatory Visit: Payer: Self-pay

## 2020-04-23 DIAGNOSIS — Z95828 Presence of other vascular implants and grafts: Secondary | ICD-10-CM

## 2020-04-23 DIAGNOSIS — R918 Other nonspecific abnormal finding of lung field: Secondary | ICD-10-CM

## 2020-04-23 DIAGNOSIS — I739 Peripheral vascular disease, unspecified: Secondary | ICD-10-CM

## 2020-04-24 NOTE — Progress Notes (Signed)
[facility] [address] [place] [address] Phone: [phone] Fax: [place], [place] [address] Phone: [phone] Fax: [phone]      Your procedure is scheduled on April 28, 2020.  Report to [facility] Main Entrance "A" at 09:50 A.M., and check in at the Admitting office.  Call this number if you have problems the morning of surgery:  [phone]  Call [phone] if you have any questions prior to your surgery date Monday-Friday 8am-4pm   Remember:  Do not eat after midnight the night before your surgery    Take these medicines the morning of surgery with A SIP OF WATER : Amlodipine (Norvasc) Budeson-Glycopyrrol-Formoterol (Breztri Aerosphere) inhaler Escitalopram (Lexapro) Fenofibrate Dexilant Rosuvastatin (Crestor) Metoprolol Tartrate (Lopressor) if needed  STOP your Plavix 5 days prior to surgery per your Doctor's instructions.  As of today, STOP taking any Aspirin (unless otherwise instructed by your surgeon) Aleve, Naproxen, Ibuprofen, Motrin, Advil, Goody's, BC's, all herbal medications, fish oil, and all vitamins.                     Do not wear jewelry, make up, or nail polish            Do not wear lotions, powders, perfumes/colognes, or deodorant.            Do not shave 48 hours prior to surgery.             Do not bring valuables to the hospital.            [facility] is not responsible for any belongings or valuables.  Do NOT Smoke (Tobacco/Vaping) or drink Alcohol 24 hours prior to your procedure  If you use a CPAP at night, you may bring all equipment for your overnight stay.   Contacts, glasses, dentures or bridgework may not be worn into surgery.      For patients admitted to the hospital, discharge time will be determined by your treatment team.   Patients  discharged the day of surgery will not be allowed to drive home, and someone needs to stay with them for 24 hours.    Special instructions:   [facility]- Preparing For Surgery  Before surgery, you can play an important role. Because skin is not sterile, your skin needs to be as free of germs as possible. You can reduce the number of germs on your skin by washing with CHG (chlorahexidine gluconate) Soap before surgery.  CHG is an antiseptic cleaner which kills germs and bonds with the skin to continue killing germs even after washing.    Oral Hygiene is also important to reduce your risk of infection.  Remember - BRUSH YOUR TEETH THE MORNING OF SURGERY WITH YOUR REGULAR TOOTHPASTE  Please do not use if you have an allergy to CHG or antibacterial soaps. If your skin becomes reddened/irritated stop using the CHG.  Do not shave (including legs and underarms) for at least 48 hours prior to first CHG shower. It is OK to shave your face.  Please follow these instructions carefully.   1. Shower the NIGHT BEFORE SURGERY and the MORNING OF SURGERY with CHG Soap.   2. If you chose to wash your hair, wash your hair first as usual with your normal shampoo.  3. After you shampoo, rinse your  hair and body thoroughly to remove the shampoo.  4. Use CHG as you would any other liquid soap. You can apply CHG directly to the skin and wash gently with a scrungie or a clean washcloth.   5. Apply the CHG Soap to your body ONLY FROM THE NECK DOWN.  Do not use on open wounds or open sores. Avoid contact with your eyes, ears, mouth and genitals (private parts). Wash Face and genitals (private parts)  with your normal soap.   6. Wash thoroughly, paying special attention to the area where your surgery will be performed.  7. Thoroughly rinse your body with warm water from the neck down.  8. DO NOT shower/wash with your normal soap after using and rinsing off the CHG Soap.  9. Pat yourself dry with a CLEAN  TOWEL.  10. Wear CLEAN PAJAMAS to bed the night before surgery  11. Place CLEAN SHEETS on your bed the night of your first shower and DO NOT SLEEP WITH PETS.   Day of Surgery: Wear Clean/Comfortable clothing the morning of surgery Do not apply any deodorants/lotions.   Remember to brush your teeth WITH YOUR REGULAR TOOTHPASTE.   Please read over the following fact sheets that you were given.

## 2020-04-25 ENCOUNTER — Encounter (HOSPITAL_COMMUNITY): Payer: Self-pay

## 2020-04-25 ENCOUNTER — Other Ambulatory Visit (HOSPITAL_COMMUNITY): Payer: Self-pay | Admitting: Cardiovascular Disease

## 2020-04-25 ENCOUNTER — Ambulatory Visit (HOSPITAL_COMMUNITY)
Admission: RE | Admit: 2020-04-25 | Discharge: 2020-04-25 | Disposition: A | Discharge status: Home / Self Care | Payer: Commercial Managed Care - PPO | Source: Ambulatory Visit | Attending: Thoracic Surgery (Cardiothoracic Vascular Surgery) | Admitting: Thoracic Surgery (Cardiothoracic Vascular Surgery)

## 2020-04-25 ENCOUNTER — Encounter (HOSPITAL_COMMUNITY)
Admission: RE | Admit: 2020-04-25 | Discharge: 2020-04-25 | Disposition: A | Discharge status: Home / Self Care | Payer: Commercial Managed Care - PPO | Source: Ambulatory Visit | Attending: Thoracic Surgery (Cardiothoracic Vascular Surgery) | Admitting: Thoracic Surgery (Cardiothoracic Vascular Surgery)

## 2020-04-25 ENCOUNTER — Other Ambulatory Visit: Payer: Self-pay

## 2020-04-25 ENCOUNTER — Other Ambulatory Visit (HOSPITAL_COMMUNITY)
Admission: RE | Admit: 2020-04-25 | Discharge: 2020-04-25 | Disposition: A | Discharge status: Home / Self Care | Payer: Commercial Managed Care - PPO | Source: Ambulatory Visit | Attending: Thoracic Surgery (Cardiothoracic Vascular Surgery) | Admitting: Thoracic Surgery (Cardiothoracic Vascular Surgery)

## 2020-04-25 DIAGNOSIS — R918 Other nonspecific abnormal finding of lung field: Secondary | ICD-10-CM

## 2020-04-25 DIAGNOSIS — I701 Atherosclerosis of renal artery: Secondary | ICD-10-CM

## 2020-04-25 DIAGNOSIS — Z20822 Contact with and (suspected) exposure to covid-19: Secondary | ICD-10-CM | POA: Diagnosis not present

## 2020-04-25 DIAGNOSIS — Z95828 Presence of other vascular implants and grafts: Secondary | ICD-10-CM

## 2020-04-25 HISTORY — DX: Chronic obstructive pulmonary disease, unspecified: J44.9

## 2020-04-25 LAB — COMPREHENSIVE METABOLIC PANEL
ALT: 13 U/L (ref 0–44)
AST: 17 U/L (ref 15–41)
Albumin: 3.5 g/dL (ref 3.5–5.0)
Alkaline Phosphatase: 103 U/L (ref 38–126)
Anion gap: 12 (ref 5–15)
BUN: 8 mg/dL (ref 6–20)
CO2: 26 mmol/L (ref 22–32)
Calcium: 9.9 mg/dL (ref 8.9–10.3)
Chloride: 101 mmol/L (ref 98–111)
Creatinine, Ser: 0.89 mg/dL (ref 0.44–1.00)
GFR calc non Af Amer: >60 mL/min (ref >60)
Glucose, Bld: 93 mg/dL (ref 70–99)
Potassium: 3.1 mmol/L — ABNORMAL LOW (ref 3.5–5.1)
Sodium: 139 mmol/L (ref 135–145)
Total Bilirubin: 0.3 mg/dL (ref 0.3–1.2)
Total Protein: 7.6 g/dL (ref 6.5–8.1)

## 2020-04-25 LAB — CBC
HCT: 45.8 % (ref 36.0–46.0)
Hemoglobin: 14.6 g/dL (ref 12.0–15.0)
MCH: 30.1 pg (ref 26.0–34.0)
MCHC: 31.9 g/dL (ref 30.0–36.0)
MCV: 94.4 fL (ref 80.0–100.0)
Platelets: 312 10*3/uL (ref 150–400)
RBC: 4.85 MIL/uL (ref 3.87–5.11)
RDW: 13.8 % (ref 11.5–15.5)
WBC: 7.7 10*3/uL (ref 4.0–10.5)
nRBC: 0 % (ref 0.0–0.2)

## 2020-04-25 LAB — APTT: aPTT: 30 seconds (ref 24–36)

## 2020-04-25 LAB — SURGICAL PCR SCREEN
MRSA, PCR: NEGATIVE
Staphylococcus aureus: NEGATIVE

## 2020-04-25 LAB — PROTIME-INR
INR: 0.9 (ref 0.8–1.2)
Prothrombin Time: 12.2 seconds (ref 11.4–15.2)

## 2020-04-25 LAB — SARS CORONAVIRUS 2 (TAT 6-24 HRS): SARS Coronavirus 2: NEGATIVE

## 2020-04-25 NOTE — Progress Notes (Signed)
PCP:  Allyn Kenner, MD Cardiologist:  Quay Burow, MD (sees for vascular reasons)  EKG:  04/25/20 CXR:  04/25/20 ECHO:  Denies Stress Test:  Denies Cardiac Cath:  Denies  Covid test 04/25/20  Anesthesia Review:  Yes, last dose Plavix 04/23/20.  For Peripheral Vascular Disease.  Patient denies shortness of breath, fever, cough, and chest pain at PAT appointment.  Patient verbalized understanding of instructions provided today at the PAT appointment.  Patient asked to review instructions at home and day of surgery.

## 2020-04-28 ENCOUNTER — Ambulatory Visit (HOSPITAL_COMMUNITY): Payer: Commercial Managed Care - PPO | Admitting: Certified Registered Nurse Anesthetist

## 2020-04-28 ENCOUNTER — Encounter (HOSPITAL_COMMUNITY)
Admission: RE | Disposition: A | Discharge status: Home / Self Care | Payer: Self-pay | Source: Home / Self Care | Attending: Thoracic Surgery (Cardiothoracic Vascular Surgery)

## 2020-04-28 ENCOUNTER — Ambulatory Visit (HOSPITAL_COMMUNITY)
Admission: RE | Admit: 2020-04-28 | Discharge: 2020-04-28 | Disposition: A | Discharge status: Home / Self Care | Payer: Commercial Managed Care - PPO | Attending: Thoracic Surgery (Cardiothoracic Vascular Surgery) | Admitting: Thoracic Surgery (Cardiothoracic Vascular Surgery)

## 2020-04-28 ENCOUNTER — Ambulatory Visit (HOSPITAL_COMMUNITY): Payer: Commercial Managed Care - PPO

## 2020-04-28 ENCOUNTER — Other Ambulatory Visit: Payer: Self-pay

## 2020-04-28 ENCOUNTER — Encounter (HOSPITAL_COMMUNITY): Payer: Self-pay | Admitting: Thoracic Surgery (Cardiothoracic Vascular Surgery)

## 2020-04-28 DIAGNOSIS — I701 Atherosclerosis of renal artery: Secondary | ICD-10-CM | POA: Diagnosis not present

## 2020-04-28 DIAGNOSIS — I10 Essential (primary) hypertension: Secondary | ICD-10-CM | POA: Diagnosis not present

## 2020-04-28 DIAGNOSIS — I739 Peripheral vascular disease, unspecified: Secondary | ICD-10-CM | POA: Diagnosis not present

## 2020-04-28 DIAGNOSIS — J432 Centrilobular emphysema: Secondary | ICD-10-CM | POA: Diagnosis not present

## 2020-04-28 DIAGNOSIS — Z7982 Long term (current) use of aspirin: Secondary | ICD-10-CM | POA: Insufficient documentation

## 2020-04-28 DIAGNOSIS — R918 Other nonspecific abnormal finding of lung field: Secondary | ICD-10-CM

## 2020-04-28 DIAGNOSIS — Z7902 Long term (current) use of antithrombotics/antiplatelets: Secondary | ICD-10-CM | POA: Diagnosis not present

## 2020-04-28 DIAGNOSIS — R911 Solitary pulmonary nodule: Secondary | ICD-10-CM

## 2020-04-28 DIAGNOSIS — I2510 Atherosclerotic heart disease of native coronary artery without angina pectoris: Secondary | ICD-10-CM | POA: Diagnosis not present

## 2020-04-28 DIAGNOSIS — Z419 Encounter for procedure for purposes other than remedying health state, unspecified: Secondary | ICD-10-CM

## 2020-04-28 DIAGNOSIS — Z79899 Other long term (current) drug therapy: Secondary | ICD-10-CM | POA: Diagnosis not present

## 2020-04-28 DIAGNOSIS — C3411 Malignant neoplasm of upper lobe, right bronchus or lung: Secondary | ICD-10-CM | POA: Diagnosis not present

## 2020-04-28 DIAGNOSIS — Z885 Allergy status to narcotic agent status: Secondary | ICD-10-CM | POA: Insufficient documentation

## 2020-04-28 DIAGNOSIS — R222 Localized swelling, mass and lump, trunk: Secondary | ICD-10-CM

## 2020-04-28 DIAGNOSIS — F17210 Nicotine dependence, cigarettes, uncomplicated: Secondary | ICD-10-CM | POA: Insufficient documentation

## 2020-04-28 DIAGNOSIS — M1990 Unspecified osteoarthritis, unspecified site: Secondary | ICD-10-CM | POA: Diagnosis not present

## 2020-04-28 HISTORY — PX: VIDEO BRONCHOSCOPY WITH ENDOBRONCHIAL NAVIGATION: SHX6175

## 2020-04-28 SURGERY — VIDEO BRONCHOSCOPY WITH ENDOBRONCHIAL NAVIGATION
Anesthesia: General

## 2020-04-28 MED ORDER — CHLORHEXIDINE GLUCONATE 0.12 % MT SOLN: 15.0000 mL | Freq: Once | OROMUCOSAL | Status: AC

## 2020-04-28 MED ORDER — MIDAZOLAM HCL 2 MG/2ML IJ SOLN
INTRAMUSCULAR | Status: AC
  Filled 2020-04-28: qty 2

## 2020-04-28 MED ORDER — MIDAZOLAM HCL 5 MG/5ML IJ SOLN
INTRAMUSCULAR | Status: DC | PRN
  Administered 2020-04-28 (×2): 1 mg via INTRAVENOUS

## 2020-04-28 MED ORDER — PROPOFOL 10 MG/ML IV BOLUS
INTRAVENOUS | Status: DC | PRN
  Administered 2020-04-28: 140 mg via INTRAVENOUS

## 2020-04-28 MED ORDER — EPHEDRINE SULFATE-NACL 50-0.9 MG/10ML-% IV SOSY
PREFILLED_SYRINGE | INTRAVENOUS | Status: DC | PRN
  Administered 2020-04-28 (×7): 5 mg via INTRAVENOUS

## 2020-04-28 MED ORDER — CHLORHEXIDINE GLUCONATE 0.12 % MT SOLN
OROMUCOSAL | Status: AC
  Administered 2020-04-28: 15 mL via OROMUCOSAL
  Filled 2020-04-28: qty 15

## 2020-04-28 MED ORDER — FENTANYL CITRATE (PF) 250 MCG/5ML IJ SOLN
INTRAMUSCULAR | Status: DC | PRN
Start: 2020-04-28 — End: 2020-04-28
  Administered 2020-04-28: 50 ug via INTRAVENOUS

## 2020-04-28 MED ORDER — ROCURONIUM BROMIDE 10 MG/ML (PF) SYRINGE
PREFILLED_SYRINGE | INTRAVENOUS | Status: AC
  Filled 2020-04-28: qty 10

## 2020-04-28 MED ORDER — EPINEPHRINE PF 1 MG/ML IJ SOLN
INTRAMUSCULAR | Status: DC | PRN
  Administered 2020-04-28: 1 mg

## 2020-04-28 MED ORDER — LACTATED RINGERS IV SOLN: INTRAVENOUS | Status: DC

## 2020-04-28 MED ORDER — ROCURONIUM BROMIDE 100 MG/10ML IV SOLN
INTRAVENOUS | Status: DC | PRN
  Administered 2020-04-28: 70 mg via INTRAVENOUS

## 2020-04-28 MED ORDER — SUGAMMADEX SODIUM 200 MG/2ML IV SOLN
INTRAVENOUS | Status: DC | PRN
  Administered 2020-04-28: 100 mg via INTRAVENOUS
  Administered 2020-04-28: 200 mg via INTRAVENOUS

## 2020-04-28 MED ORDER — ALBUTEROL SULFATE (2.5 MG/3ML) 0.083% IN NEBU
INHALATION_SOLUTION | RESPIRATORY_TRACT | Status: AC
  Administered 2020-04-28: 2.5 mg via RESPIRATORY_TRACT
  Filled 2020-04-28: qty 3

## 2020-04-28 MED ORDER — ORAL CARE MOUTH RINSE: 15.0000 mL | Freq: Once | OROMUCOSAL | Status: AC

## 2020-04-28 MED ORDER — DEXAMETHASONE SODIUM PHOSPHATE 10 MG/ML IJ SOLN
INTRAMUSCULAR | Status: AC
  Filled 2020-04-28: qty 1

## 2020-04-28 MED ORDER — PHENYLEPHRINE HCL-NACL 10-0.9 MG/250ML-% IV SOLN
INTRAVENOUS | Status: DC | PRN
  Administered 2020-04-28: 40 ug/min via INTRAVENOUS

## 2020-04-28 MED ORDER — FENTANYL CITRATE (PF) 250 MCG/5ML IJ SOLN
INTRAMUSCULAR | Status: AC
  Filled 2020-04-28: qty 5

## 2020-04-28 MED ORDER — 0.9 % SODIUM CHLORIDE (POUR BTL) OPTIME
TOPICAL | Status: DC | PRN
  Administered 2020-04-28: 1000 mL

## 2020-04-28 MED ORDER — PHENYLEPHRINE 40 MCG/ML (10ML) SYRINGE FOR IV PUSH (FOR BLOOD PRESSURE SUPPORT)
PREFILLED_SYRINGE | INTRAVENOUS | Status: DC | PRN
  Administered 2020-04-28 (×6): 40 ug via INTRAVENOUS
  Administered 2020-04-28 (×2): 80 ug via INTRAVENOUS
  Administered 2020-04-28: 40 ug via INTRAVENOUS
  Administered 2020-04-28: 80 ug via INTRAVENOUS

## 2020-04-28 MED ORDER — PROPOFOL 10 MG/ML IV BOLUS
INTRAVENOUS | Status: AC
  Filled 2020-04-28: qty 40

## 2020-04-28 MED ORDER — ALBUTEROL SULFATE (2.5 MG/3ML) 0.083% IN NEBU: 2.5000 mg | INHALATION_SOLUTION | Freq: Once | RESPIRATORY_TRACT | Status: AC

## 2020-04-28 MED ORDER — METOPROLOL TARTRATE 25 MG PO TABS
25.0000 mg | ORAL_TABLET | Freq: Two times a day (BID) | ORAL | 0 refills | Status: DC | PRN
Start: 2020-04-28 — End: 2022-11-02

## 2020-04-28 MED ORDER — EPINEPHRINE PF 1 MG/ML IJ SOLN
INTRAMUSCULAR | Status: AC
  Filled 2020-04-28: qty 1

## 2020-04-28 MED ORDER — ALBUMIN HUMAN 5 % IV SOLN: INTRAVENOUS | Status: DC | PRN

## 2020-04-28 MED ORDER — ACETAMINOPHEN 500 MG PO TABS
1000.0000 mg | ORAL_TABLET | Freq: Once | ORAL | Status: AC
  Administered 2020-04-28: 1000 mg via ORAL
  Filled 2020-04-28: qty 2

## 2020-04-28 MED ORDER — PHENYLEPHRINE 40 MCG/ML (10ML) SYRINGE FOR IV PUSH (FOR BLOOD PRESSURE SUPPORT)
PREFILLED_SYRINGE | INTRAVENOUS | Status: AC
  Filled 2020-04-28: qty 10

## 2020-04-28 MED ORDER — ONDANSETRON HCL 4 MG/2ML IJ SOLN
INTRAMUSCULAR | Status: DC | PRN
  Administered 2020-04-28: 4 mg via INTRAVENOUS

## 2020-04-28 MED ORDER — ONDANSETRON HCL 4 MG/2ML IJ SOLN
INTRAMUSCULAR | Status: AC
  Filled 2020-04-28: qty 2

## 2020-04-28 MED ORDER — LIDOCAINE 2% (20 MG/ML) 5 ML SYRINGE
INTRAMUSCULAR | Status: DC | PRN
  Administered 2020-04-28: 60 mg via INTRAVENOUS

## 2020-04-28 MED ORDER — LIDOCAINE 2% (20 MG/ML) 5 ML SYRINGE
INTRAMUSCULAR | Status: AC
  Filled 2020-04-28: qty 5

## 2020-04-28 MED ORDER — DEXAMETHASONE SODIUM PHOSPHATE 10 MG/ML IJ SOLN
INTRAMUSCULAR | Status: DC | PRN
  Administered 2020-04-28: 5 mg via INTRAVENOUS

## 2020-04-28 MED ORDER — CELECOXIB 200 MG PO CAPS
200.0000 mg | ORAL_CAPSULE | Freq: Once | ORAL | Status: AC
  Administered 2020-04-28: 200 mg via ORAL
  Filled 2020-04-28: qty 1

## 2020-04-28 SURGICAL SUPPLY — 44 items
ADAPTER BRONCHOSCOPE OLYMPUS (ADAPTER) ×2 IMPLANT
ADAPTER VALVE BIOPSY EBUS (MISCELLANEOUS) IMPLANT
ADPTR VALVE BIOPSY EBUS (MISCELLANEOUS)
BLADE CLIPPER SURG (BLADE) IMPLANT
BRUSH BIOPSY BRONCH 10 SDTNB (MISCELLANEOUS) ×4 IMPLANT
BRUSH CYTOLOGY (MISCELLANEOUS) ×2 IMPLANT
BRUSH SUPERTRAX BIOPSY (INSTRUMENTS) IMPLANT
BRUSH SUPERTRAX NDL-TIP CYTO (INSTRUMENTS) ×6 IMPLANT
CANISTER SUCT 3000ML PPV (MISCELLANEOUS) ×2 IMPLANT
CNTNR URN SCR LID CUP LEK RST (MISCELLANEOUS) ×4 IMPLANT
CONT SPEC 4OZ STRL OR WHT (MISCELLANEOUS) ×8
COVER BACK TABLE 60X90IN (DRAPES) ×2 IMPLANT
FILTER STRAW FLUID ASPIR (MISCELLANEOUS) ×2 IMPLANT
FORCEPS BIOP SUPERTRX PREMAR (INSTRUMENTS) ×8 IMPLANT
GAUZE SPONGE 4X4 12PLY STRL (GAUZE/BANDAGES/DRESSINGS) ×2 IMPLANT
GLOVE SURG SIGNA 7.5 PF LTX (GLOVE) ×2 IMPLANT
GLOVE SURG SS PI 7.5 STRL IVOR (GLOVE) ×2 IMPLANT
GOWN STRL REUS W/ TWL XL LVL3 (GOWN DISPOSABLE) ×1 IMPLANT
GOWN STRL REUS W/TWL XL LVL3 (GOWN DISPOSABLE) ×2
KIT CLEAN ENDO COMPLIANCE (KITS) ×2 IMPLANT
KIT ILLUMISITE 180 PROCEDURE (KITS) ×2 IMPLANT
KIT ILLUMISITE 90 PROCEDURE (KITS) ×2 IMPLANT
KIT TURNOVER KIT B (KITS) ×2 IMPLANT
MARKER SKIN DUAL TIP RULER LAB (MISCELLANEOUS) ×2 IMPLANT
NEEDLE SUPERTRX PREMARK BIOPSY (NEEDLE) ×8 IMPLANT
NS IRRIG 1000ML POUR BTL (IV SOLUTION) ×2 IMPLANT
OIL SILICONE PENTAX (PARTS (SERVICE/REPAIRS)) ×2 IMPLANT
PAD ARMBOARD 7.5X6 YLW CONV (MISCELLANEOUS) ×4 IMPLANT
PATCHES PATIENT (LABEL) ×6 IMPLANT
SYR 10ML LL (SYRINGE) ×2 IMPLANT
SYR 20ML ECCENTRIC (SYRINGE) ×4 IMPLANT
SYR 20ML LL LF (SYRINGE) ×2 IMPLANT
SYR 30ML LL (SYRINGE) ×2 IMPLANT
SYR 3ML LL SCALE MARK (SYRINGE) ×2 IMPLANT
SYR 5ML LL (SYRINGE) ×2 IMPLANT
TOWEL GREEN STERILE (TOWEL DISPOSABLE) ×2 IMPLANT
TOWEL GREEN STERILE FF (TOWEL DISPOSABLE) ×2 IMPLANT
TRAP SPECIMEN MUCUS 40CC (MISCELLANEOUS) ×2 IMPLANT
TUBE CONNECTING 20X1/4 (TUBING) ×4 IMPLANT
UNDERPAD 30X36 HEAVY ABSORB (UNDERPADS AND DIAPERS) ×2 IMPLANT
VALVE BIOPSY  SINGLE USE (MISCELLANEOUS) ×2
VALVE BIOPSY SINGLE USE (MISCELLANEOUS) ×1 IMPLANT
VALVE SUCTION BRONCHIO DISP (MISCELLANEOUS) ×2 IMPLANT
WATER STERILE IRR 1000ML POUR (IV SOLUTION) IMPLANT

## 2020-04-28 NOTE — Op Note (Signed)
NAME: Connie Farrell, Connie Farrell MEDICAL RECORD XL:[id number] ACCOUNT [id number] DATE OF BIRTH:[birth year] FACILITY: [facility] LOCATION: [place], [place]  OPERATIVE REPORT  DATE OF PROCEDURE:  04/28/2020  PREOPERATIVE DIAGNOSES:   1.  Left lower lobe mass versus infiltrate. 2.  Right upper and middle lobe nodules.  POSTOPERATIVE DIAGNOSES:   1.  Left lower lobe mass versus infiltrate. 2.  Right upper and middle lobe nodules.  PROCEDURE:  Electromagnetic navigational bronchoscopy with needle aspirations, brushings and transbronchial biopsies at 3 sites.  SURGEON:  Modesto Charon, MD  ASSISTANT:  None.  ANESTHESIA:  General.  FINDINGS:  No definite malignancy on any of the preliminary specimens.  CLINICAL NOTE:  Connie Farrell is a 59 year old woman with a history of tobacco abuse, who was found to have a lung nodule on a low-dose screening CT in September of 2019.  She has been followed since that time and there has been enlargement of an inferior  right upper lobe nodule to 11 mm.  That nodule was hypermetabolic on PET CT.  Also noted on PET CT was large infiltrate versus mass in the anterior left lower lobe that was hypermetabolic as well.  She was advised to undergo navigational bronchoscopy for  diagnostic purposes.  The indications, risks, benefits, and alternatives were discussed in detail with the patient.  She understood and accepted the risks and agreed to proceed.  DESCRIPTION OF PROCEDURE:  Connie Farrell was brought to the operating room on 04/28/2020.  She had induction of general anesthesia and was intubated.  Sequential compression devices were placed on the calves for DVT prophylaxis.  A timeout was performed.   Flexible fiberoptic bronchoscopy was performed via the endotracheal tube.  It revealed normal endobronchial anatomy with no endobronchial lesions to the level of the subsegmental bronchi.  Locatable guide for navigation was placed.  Registration  was performed.  There was good correlation of the video and virtual bronchoscopy.  Planning for the navigation had been done on the console  prior to the patient's entry into the operating room.  An attempt was made to navigate the locatable guide with a 180-degree sheath to the left lower lobe nodule.  This was unsuccessful.  A 90-degree sheath then was used.  Registration was reperformed and the catheter then advanced easily into the vicinity  of the mass/infiltrate in the left lower lobe.  Sampling then was performed.  All sampling was done with fluoroscopy.  Three needle aspirations were performed, followed by 3 passes with a needle brush and then multiple transbronchial biopsies were obtained.  Some of  the biopsies were sent for AFB, fungal and bacterial cultures.  The remainder were sent for permanent pathology.  Quick prep on these specimens did not reveal any definite malignancy.  There were mostly histiocytes present.  Brushings were repeated with a  triple brush.  The catheter then was redirected to the right middle lobe and again the 90-degree sheath was used.  It was advanced to within a centimeter of the center of the nodule with good alignment.  The sampling process was repeated there with needle aspirations,  needle brushings and biopsies of this lesion.  All the biopsies were sent for permanent pathology.  Again, the quick preps on these specimens revealed no definite malignancy.  The bronchoscope then was redirected to the right upper lobe nodule.  The locatable guide was advanced to within a centimeter of the nodule in the inferior portion of the right upper lobe.  Local registration was performed.  The catheter was ultimately  within a centimeter of the center of the lesion with good alignment and sampling was performed from this lesion as well.  There was some question of possible granulomas on the quick prep, but we will have to await the final path for full determination.   There  was some mild bleeding with the biopsies, which cleared with saline.  The total fluoroscopy time was 7.8 minutes and the total dose was 78 milligray.  After completion of the sampling, a final inspection was made for hemostasis.  There was no  ongoing bleeding.  The patient then was extubated in the operating room and taken to the [facility] in good condition.  VN/NUANCE  D:04/28/2020 T:04/28/2020 JOB:[id number]

## 2020-04-28 NOTE — Transfer of Care (Signed)
Immediate Anesthesia Transfer of Care Note  Patient: Connie Farrell  Procedure(s) Performed: VIDEO BRONCHOSCOPY WITH ENDOBRONCHIAL NAVIGATION (N/A )  Patient Location: PACU  Anesthesia Type:General  Level of Consciousness: drowsy  Airway & Oxygen Therapy: Patient Spontanous Breathing and Patient connected to face mask oxygen  Post-op Assessment: Report given to RN and Post -op Vital signs reviewed and stable  Post vital signs: Reviewed  Last Vitals:  Vitals Value Taken Time  BP 118/67 04/28/20 1209  Temp    Pulse 75 04/28/20 1213  Resp 18 04/28/20 1213  SpO2 100 % 04/28/20 1213  Vitals shown include unvalidated device data.  Last Pain:  Vitals:   04/28/20 0948  TempSrc:   PainSc: 0-No pain      Patients Stated Pain Goal: 5 (61/95/09 3267)  Complications: No complications documented.

## 2020-04-28 NOTE — Interval H&P Note (Signed)
History and Physical Interval Note:  04/28/2020 9:46 AM  Connie Farrell  has presented today for surgery, with the diagnosis of RUL AND RML NODULES LEFT LUNG INFILTRATES VS MASS.  The various methods of treatment have been discussed with the patient and family. After consideration of risks, benefits and other options for treatment, the patient has consented to  Procedure(s): [place] (N/A) as a surgical intervention.  The patient's history has been reviewed, patient examined, no change in status, stable for surgery.  I have reviewed the patient's chart and labs.  Questions were answered to the patient's satisfaction.     [name]

## 2020-04-28 NOTE — Anesthesia Preprocedure Evaluation (Addendum)
Anesthesia Evaluation  Patient identified by MRN, date of birth, ID band Patient awake    Reviewed: Allergy & Precautions, NPO status , Patient's Chart, lab work & pertinent test results  History of Anesthesia Complications Negative for: history of anesthetic complications  Airway Mallampati: II  TM Distance: >3 FB Neck ROM: Full    Dental no notable dental hx. (+) Dental Advisory Given   Pulmonary COPD, Current Smoker and Patient abstained from smoking.,    Pulmonary exam normal        Cardiovascular hypertension, Pt. on medications and Pt. on home beta blockers Normal cardiovascular exam     Neuro/Psych Anxiety negative neurological ROS     GI/Hepatic Neg liver ROS, GERD  ,  Endo/Other  negative endocrine ROS  Renal/GU negative Renal ROS     Musculoskeletal negative musculoskeletal ROS (+)   Abdominal   Peds  Hematology negative hematology ROS (+)   Anesthesia Other Findings   Reproductive/Obstetrics                            Anesthesia Physical Anesthesia Plan  ASA: III  Anesthesia Plan: General   Post-op Pain Management:    Induction:   PONV Risk Score and Plan: 2 and Ondansetron and Dexamethasone  Airway Management Planned: Oral ETT  Additional Equipment:   Intra-op Plan:   Post-operative Plan: Extubation in OR  Informed Consent: I have reviewed the patients History and Physical, chart, labs and discussed the procedure including the risks, benefits and alternatives for the proposed anesthesia with the patient or authorized representative who has indicated his/her understanding and acceptance.     Dental advisory given  Plan Discussed with: Anesthesiologist and CRNA  Anesthesia Plan Comments:        Anesthesia Quick Evaluation

## 2020-04-28 NOTE — Anesthesia Postprocedure Evaluation (Signed)
Anesthesia Post Note  Patient: Connie Farrell  Procedure(s) Performed: VIDEO BRONCHOSCOPY WITH ENDOBRONCHIAL NAVIGATION (N/A )     Patient location during evaluation: PACU Anesthesia Type: General Level of consciousness: sedated Pain management: pain level controlled Vital Signs Assessment: post-procedure vital signs reviewed and stable Respiratory status: spontaneous breathing and respiratory function stable Cardiovascular status: stable Postop Assessment: no apparent nausea or vomiting Anesthetic complications: no   No complications documented.  Last Vitals:  Vitals:   04/28/20 1224 04/28/20 1239  BP: 123/67 122/86  Pulse: 80 80  Resp: 16 17  Temp:  36.7 C  SpO2: 98% 92%    Last Pain:  Vitals:   04/28/20 1239  TempSrc:   PainSc: 1                  Deva Ron DANIEL

## 2020-04-28 NOTE — Anesthesia Procedure Notes (Signed)
Procedure Name: Intubation Date/Time: 04/28/2020 10:24 AM Performed by: Janene Harvey, CRNA Pre-anesthesia Checklist: Patient identified, Emergency Drugs available, Suction available and Patient being monitored Patient Re-evaluated:Patient Re-evaluated prior to induction Oxygen Delivery Method: Circle system utilized Preoxygenation: Pre-oxygenation with 100% oxygen Induction Type: IV induction Ventilation: Mask ventilation without difficulty Laryngoscope Size: Mac and 4 Grade View: Grade I Tube type: Oral Tube size: 8.5 mm Number of attempts: 1 Airway Equipment and Method: Stylet and Oral airway Placement Confirmation: ETT inserted through vocal cords under direct vision,  positive ETCO2 and breath sounds checked- equal and bilateral Secured at: 20 cm Tube secured with: Tape Dental Injury: Teeth and Oropharynx as per pre-operative assessment

## 2020-04-28 NOTE — Brief Op Note (Signed)
04/28/2020  12:14 PM  PATIENT:  Connie Farrell  59 y.o. female  PRE-OPERATIVE DIAGNOSIS:   RIGHT UPPER UPPER LOBE NODULE RIGHT MIDDLE LOBE NODULE LEFT LUNG INFILTRATES VS MASS  POST-OPERATIVE DIAGNOSIS: RIGHT UPPER UPPER LOBE NODULE RIGHT MIDDLE LOBE NODULE LEFT LUNG INFILTRATES VS MASS  PROCEDURE:  Procedure(s): VIDEO BRONCHOSCOPY WITH ENDOBRONCHIAL NAVIGATION (N/A) Needle aspirations, brushings and transbronchial biopsies of LLL, RML and RUL  SURGEON:  Surgeon(s) and Role:    * [name], MD - Primary  PHYSICIAN ASSISTANT:   ASSISTANTS: none   ANESTHESIA:   general  EBL:  minimal   BLOOD ADMINISTERED:none  DRAINS: none   LOCAL MEDICATIONS USED:  NONE  SPECIMEN:  Source of Specimen:  LLL "mass," RML nodule, RUL nodule  DISPOSITION OF SPECIMEN:  Path and micro  COUNTS:  NO endo  TOURNIQUET:  * No tourniquets in log *  DICTATION: .Other Dictation: Dictation Number -  PLAN OF CARE: Discharge to home after PACU  PATIENT DISPOSITION:  PACU - hemodynamically stable.   Delay start of Pharmacological VTE agent (>24hrs) due to surgical blood loss or risk of bleeding: not applicable

## 2020-04-28 NOTE — Discharge Instructions (Addendum)
Do not drive or engage in heavy physical activity for 24 hours  You may resume normal activities tomorrow  You may use acetaminophen (Tylenol) if needed for discomfort. You may use an over the counter cough medication if needed.  You may cough up small amounts of blood over the next few days.  Call [phone] if you develop chest pain, shortness of breath, fever > 101F or cough up more than 2 tablespoons of blood  My office will contact you with follow up information

## 2020-04-29 ENCOUNTER — Other Ambulatory Visit: Payer: Self-pay

## 2020-04-29 ENCOUNTER — Encounter (HOSPITAL_COMMUNITY): Payer: Self-pay | Admitting: Thoracic Surgery (Cardiothoracic Vascular Surgery)

## 2020-04-29 LAB — ACID FAST SMEAR (AFB, MYCOBACTERIA): Acid Fast Smear: NEGATIVE

## 2020-04-29 LAB — CYTOLOGY - NON PAP

## 2020-04-30 ENCOUNTER — Ambulatory Visit (HOSPITAL_COMMUNITY)
Admission: RE | Admit: 2020-04-30 | Discharge: 2020-04-30 | Disposition: A | Discharge status: Home / Self Care | Payer: Commercial Managed Care - PPO | Source: Ambulatory Visit | Attending: Internal Medicine | Admitting: Internal Medicine

## 2020-04-30 ENCOUNTER — Telehealth: Payer: Self-pay | Admitting: Cardiovascular Disease

## 2020-04-30 ENCOUNTER — Other Ambulatory Visit: Payer: Self-pay

## 2020-04-30 ENCOUNTER — Ambulatory Visit: Payer: Commercial Managed Care - PPO | Admitting: Cardiovascular Disease

## 2020-04-30 DIAGNOSIS — J449 Chronic obstructive pulmonary disease, unspecified: Secondary | ICD-10-CM | POA: Insufficient documentation

## 2020-04-30 LAB — PULMONARY FUNCTION TEST
DL/VA % pred: 74 %
DL/VA: 3.16 ml/min/mmHg/L
DLCO cor % pred: 55 %
DLCO cor: 11.34 ml/min/mmHg
DLCO unc % pred: 57 %
DLCO unc: 11.74 ml/min/mmHg
FEF 25-75 Post: 2.1 L/sec
FEF 25-75 Pre: 3.3 L/sec
FEF2575-%Change-Post: -36 %
FEF2575-%Pred-Post: 84 %
FEF2575-%Pred-Pre: 133 %
FEV1-%Change-Post: -4 %
FEV1-%Pred-Post: 73 %
FEV1-%Pred-Pre: 76 %
FEV1-Post: 1.92 L
FEV1-Pre: 2.01 L
FEV1FVC-%Change-Post: -8 %
FEV1FVC-%Pred-Pre: 116 %
FEV6-%Change-Post: 0 %
FEV6-%Pred-Post: 66 %
FEV6-%Pred-Pre: 66 %
FEV6-Post: 2.19 L
FEV6-Pre: 2.17 L
FEV6FVC-%Change-Post: 0 %
FEV6FVC-%Pred-Post: 103 %
FEV6FVC-%Pred-Pre: 103 %
FVC-%Change-Post: 4 %
FVC-%Pred-Post: 67 %
FVC-%Pred-Pre: 64 %
FVC-Post: 2.29 L
FVC-Pre: 2.19 L
Post FEV1/FVC ratio: 84 %
Post FEV6/FVC ratio: 100 %
Pre FEV1/FVC ratio: 92 %
Pre FEV6/FVC Ratio: 100 %
RV % pred: 108 %
RV: 2.1 L
TLC % pred: 88 %
TLC: 4.46 L

## 2020-04-30 LAB — SURGICAL PATHOLOGY

## 2020-04-30 MED ORDER — ALBUTEROL SULFATE (2.5 MG/3ML) 0.083% IN NEBU
2.5000 mg | INHALATION_SOLUTION | Freq: Once | RESPIRATORY_TRACT | Status: AC
  Administered 2020-04-30: 2.5 mg via RESPIRATORY_TRACT

## 2020-04-30 NOTE — Telephone Encounter (Signed)
A Renal Vascular test was put in with an ABI for this patient, are these supposed to be scheduled together and linked?

## 2020-04-30 NOTE — Telephone Encounter (Signed)
Will forward message to the [facility] in [place]...   She is scheduled for renal and lower extremity arterial Doppler studies in the next month or so.  Orders are in the computer. Pt still needing to be scheduled.

## 2020-05-05 ENCOUNTER — Other Ambulatory Visit: Payer: Self-pay

## 2020-05-05 ENCOUNTER — Ambulatory Visit (HOSPITAL_COMMUNITY)
Admission: RE | Admit: 2020-05-05 | Discharge: 2020-05-05 | Disposition: A | Discharge status: Home / Self Care | Payer: Commercial Managed Care - PPO | Source: Ambulatory Visit | Attending: Cardiovascular Disease | Admitting: Cardiovascular Disease

## 2020-05-05 DIAGNOSIS — Z95828 Presence of other vascular implants and grafts: Secondary | ICD-10-CM | POA: Insufficient documentation

## 2020-05-05 DIAGNOSIS — I739 Peripheral vascular disease, unspecified: Secondary | ICD-10-CM | POA: Insufficient documentation

## 2020-05-06 ENCOUNTER — Ambulatory Visit: Payer: Commercial Managed Care - PPO | Admitting: Thoracic Surgery (Cardiothoracic Vascular Surgery)

## 2020-05-06 ENCOUNTER — Other Ambulatory Visit: Payer: Self-pay | Admitting: Thoracic Surgery (Cardiothoracic Vascular Surgery)

## 2020-05-06 VITALS — BP 145/88 | HR 80 | Temp 97.7°F | Resp 20 | Ht 64.0 in | Wt 140.0 lb

## 2020-05-06 DIAGNOSIS — R911 Solitary pulmonary nodule: Secondary | ICD-10-CM

## 2020-05-06 DIAGNOSIS — Z9889 Other specified postprocedural states: Secondary | ICD-10-CM

## 2020-05-06 MED ORDER — AZITHROMYCIN 500 MG PO TABS: 500.0000 mg | ORAL_TABLET | Freq: Every day | ORAL | 0 refills | Status: AC

## 2020-05-06 NOTE — Progress Notes (Signed)
[address]       ,[place] [address]             [phone]     HPI: Connie Farrell returns to discuss the results of her bronchoscopy.  Connie Farrell is a 59 year old woman with a history of tobacco abuse, hypertension, hyperlipidemia, atherosclerotic cardiovascular disease, iliac stent, renal artery stenosis, reflux, arthritis, macrocytic anemia, and anxiety.  She was first found to have a lung nodule on a low-dose screening CT in September 2019.  The nodule was in the right upper lobe.  She has been followed since then.  A CT on 04/03/2020 showed enlargement of the right upper lobe nodule to 11 mm.  There was an 8 mm soft solid nodule in the right middle lobe.  There was a new area of consolidation in the anterior left lower lobe.  On PET CT the right upper lobe nodule was hypermetabolic.  There also was marked hypermetabolic activity in the left lower lobe with an SUV of 11.7.  The radiologist felt like the left lower lobe process was likely infectious given that there were no suspicious findings here 6 months prior.  I did a navigational bronchoscopy on 04/28/2020.  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  Needle aspirations, brushings, and biopsies from the right middle lobe and left lower lobe were nondiagnostic.  Specifically the left lower lobe biopsies did not show cancer but they also didn't show any granulomas or anything that would definitively rule out that possibility.  She tolerated the procedure well.  She had small amount of hemoptysis for a day or two afterwards.  That resolved and she hasn't had any further issues.  Past Medical History:  Diagnosis Date  . Anxiety   . Arthritis    "hands; spine too" (04/04/2012)  . COPD (chronic obstructive pulmonary disease) ([place])   . Exertional dyspnea   . Folate deficiency 10/17/2015  . GERD (gastroesophageal reflux disease)   . Hyperlipemia   . Hypertension   . Macrocytosis  10/17/2015  . Neck pain, chronic   . PAD (peripheral artery disease) ([place])   . Renal artery stenosis ([place])   . Seasonal allergies   . Tobacco abuse     Current Outpatient Medications  Medication Sig Dispense Refill  . amLODipine (NORVASC) 5 MG tablet Take 1 tablet (5 mg total) by mouth daily. 180 tablet 3  . aspirin EC 81 MG tablet Take 81 mg by mouth daily.    . Budeson-Glycopyrrol-Formoterol (BREZTRI AEROSPHERE) 160-9-4.8 MCG/ACT AERO Inhale 2 puffs into the lungs in the morning and at bedtime. Take 2 puffs first thing in am and then another 2 puffs about 12 hours later. (Patient taking differently: Inhale 2 puffs into the lungs in the morning and at bedtime. ) 10.7 g 11  . cholecalciferol (VITAMIN D3) 25 MCG (1000 UNIT) tablet Take 1,000 Units by mouth daily.    . clopidogrel (PLAVIX) 75 MG tablet Take 1 tablet (75 mg total) by mouth daily. PLEASE SCHEDULE APPOINTMENT FOR FURTHER REFILLS. 2ND ATTEMPT 15 tablet 0  . DEXILANT 60 MG capsule TAKE 1 CAPSULE ONCE DAILY (Patient taking differently: Take 60 mg by mouth daily. ) 90 capsule 1  . escitalopram (LEXAPRO) 10 MG tablet Take 10 mg by mouth Daily.    . fenofibrate 160 MG tablet Take 160 mg by mouth daily.     . folic acid (FOLVITE) 1 MG tablet Take 1 tablet (1 mg  total) by mouth daily. 90 tablet 4  . metoprolol tartrate (LOPRESSOR) 25 MG tablet Take 1 tablet (25 mg total) by mouth 2 (two) times daily as needed (afib). 60 tablet 0  . Red Yeast Rice 600 MG CAPS Take 600 mg by mouth 2 (two) times daily.     . rosuvastatin (CRESTOR) 20 MG tablet Take 20 mg by mouth daily.     . vitamin B-12 (CYANOCOBALAMIN) 1000 MCG tablet Take 1 tablet (1,000 mcg total) by mouth daily. 60 tablet 2  . azithromycin (ZITHROMAX) 500 MG tablet Take 1 tablet (500 mg total) by mouth daily for 5 days. 5 each 0  . Melatonin 10 MG TABS Take 10 mg by mouth at bedtime. (Patient not taking: Reported on 05/06/2020)     No current facility-administered medications for this  visit.    Physical Exam BP (!) 145/88   Pulse 80   Temp 97.7 F (36.5 C) (Skin)   Resp 20   Ht 5\' 4"  (1.626 m)   Wt 140 lb (63.5 kg)   SpO2 93% Comment: RA  BMI 24.24 kg/m  59 year old woman in no acute distress Alert and oriented x3 no focal deficits Lungs clear bilaterally no rales or wheezing Cardiac regular rate and rhythm  Diagnostic Tests: NUCLEAR MEDICINE PET SKULL BASE TO THIGH  TECHNIQUE: 8.0 mCi F-18 FDG was injected intravenously. Full-ring PET imaging was performed from the skull base to thigh after the radiotracer. CT data was obtained and used for attenuation correction and anatomic localization.  Fasting blood glucose: 102 mg/dl  COMPARISON:  Lung cancer screening chest CT 04/03/2020  FINDINGS: Mediastinal blood pool activity: SUV max 2.4  Liver activity: SUV max NA  NECK: No hypermetabolic lymph nodes in the neck.  Incidental CT findings: none  CHEST: Right upper lobe pulmonary nodule of concern on recent lung cancer screening chest CT is hypermetabolic with SUV max = 5.6.  Dense masslike consolidative opacity in the anterior left lower lobe is also markedly hypermetabolic with SUV max = 10.1. A hypermetabolic nodule seen just medial to this dominant lesion is hypermetabolic but somewhat obscured by breathing motion on CT imaging.  7 mm short axis paraesophageal lymph node seen on image 88/series 3 shows low level hypermetabolism with SUV max = 4.7. Focal hypermetabolism in the left hilum demonstrates SUV max = 4.0 although no discrete lymph node is visible on today's noncontrast imaging.  Incidental CT findings: Heart size upper normal. Coronary artery calcification is evident. Atherosclerotic calcification is noted in the wall of the thoracic aorta.  ABDOMEN/PELVIS: No abnormal hypermetabolic activity within the liver, pancreas, adrenal glands, or spleen. No hypermetabolic lymph nodes in the abdomen or pelvis.  Incidental  CT findings: Diffuse low attenuation of the liver parenchyma is compatible with fatty deposition. Possible tiny layering gallstone (image 165/series 3). 13 mm right adrenal nodule measures -16 Hounsfield units in shows no hypermetabolism, features most consistent with adenoma. There is abdominal aortic atherosclerosis without aneurysm. Left common iliac artery stent device noted. Small left groin hernia contains only fat.  SKELETON: No focal hypermetabolic activity to suggest skeletal metastasis.  Incidental CT findings: none  IMPRESSION: 1. Right upper lobe pulmonary nodule of concern on recent lung cancer screening CT is hypermetabolic, consistent with neoplasm, likely primary. 2. The anterior left lower lobe dense consolidative opacity is also markedly hypermetabolic. This finding was new in the interval between lung cancer screening CT of 04/03/2020 and 10/01/2019. Given the rapid interval appearance, infectious/inflammatory etiology favored, but neoplasm  not excluded. There is a small hypermetabolic satellite nodule medial to the dominant lesion. 3. Low level hypermetabolism identified in the left hilum and in a normal sized paraesophageal lymph node. This is somewhat indeterminate given the bilateral lung findings and could be reactive if the left lower lobe process is infectious/inflammatory. 4. 13 mm right adrenal adenoma. 5.  Aortic Atherosclerois (ICD10-170.0) 6.  Emphysema. ([id number]-G54.9)   Electronically Signed   By: Misty Stanley M.D.   On: 04/15/2020 13:17 I again reviewed the CT and PET/CT images and concur with the findings noted above  Pulmonary function testing 04/30/2020 FVC 2.19 (64%) FEV1 2.01 (76%) No improvement with bronchodilators DLCO 11.34 (55%)  Impression: Connie Farrell is a 59 year old woman with a history of tobacco abuse, hypertension, hyperlipidemia, atherosclerotic cardiovascular disease, iliac stent, renal artery stenosis, reflux,  arthritis, macrocytic anemia, and anxiety.  She was found to have a suspicious right upper lobe lung nodule on a low-dose screening CT.  That was followed over time.  That recently showed an increase in size and on PET CT was hypermetabolic.  There is a subsolid nodule in the right middle lobe that was not active on PET.  There was a new area of opacity/atelectasis in the left lower lobe on her most recent CT.  That area had a high metabolic rate on PET/CT.  It was felt to likely be infectious or inflammatory.  I did a navigational bronchoscopy last week. Right upper lobe nodule-non-small cell carcinoma Right middle lobe nodule-nondiagnostic Left lower lobe infiltrate/question mass-nondiagnostic.  My suspicion is that the left lower lobe process is likely infectious in nature.  It grew from nothing to a large size in 48-month.  It was markedly hypermetabolic on PET.  There is no endobronchial mass seen on bronchoscopy but there could be small nodule further out in the bronchus.  Brushings and biopsies were negative for tumor.  Unfortunately they also didn't show anything like granulomas that with be a more definitive non cancer diagnosis.  AFB and fungal cultures are pending but stains were negative.  She is a candidate for resection of the right upper lobe cancer.  We would plan to also wedge out the right middle lobe nodule at the time of the procedure.  However, I want to be comfortable that the left lower lobe process is not cancerous before we had down that pathway.  Therefore, I recommended that we treat with empiric antibiotics and then do another scan in 3 to 4 weeks (8 weeks from her previous scan).  Plan: Azithromycin 500 mg p.o. daily x5 days Return in 3 to 4 weeks with CT chest  I spent over 20 minutes in review of records, review of results, review of images, and consultation with Connie Farrell today. [name], MD [facility] [phone]

## 2020-05-06 NOTE — Progress Notes (Signed)
Spoke with pt and notified of results per Dr. Wert. Pt verbalized understanding and denied any questions. 

## 2020-05-08 ENCOUNTER — Other Ambulatory Visit: Payer: Self-pay | Admitting: *Deleted

## 2020-05-08 NOTE — Progress Notes (Signed)
The proposed treatment discussed in cancer conference 05/08/20 is for discussion purpose only and is not a binding recommendation.  The patient was not physically examined nor present for their treatment options.  Therefore, final treatment plans cannot be decided.

## 2020-05-12 ENCOUNTER — Inpatient Hospital Stay (HOSPITAL_COMMUNITY): Payer: Commercial Managed Care - PPO | Attending: Hematology | Admitting: Hematology

## 2020-05-12 ENCOUNTER — Other Ambulatory Visit: Payer: Self-pay

## 2020-05-12 VITALS — BP 162/91 | HR 67 | Temp 97.0°F | Resp 18 | Wt 143.3 lb

## 2020-05-12 DIAGNOSIS — F419 Anxiety disorder, unspecified: Secondary | ICD-10-CM | POA: Diagnosis not present

## 2020-05-12 DIAGNOSIS — D3501 Benign neoplasm of right adrenal gland: Secondary | ICD-10-CM | POA: Insufficient documentation

## 2020-05-12 DIAGNOSIS — K219 Gastro-esophageal reflux disease without esophagitis: Secondary | ICD-10-CM | POA: Insufficient documentation

## 2020-05-12 DIAGNOSIS — M1990 Unspecified osteoarthritis, unspecified site: Secondary | ICD-10-CM | POA: Diagnosis not present

## 2020-05-12 DIAGNOSIS — E611 Iron deficiency: Secondary | ICD-10-CM | POA: Diagnosis not present

## 2020-05-12 DIAGNOSIS — C3411 Malignant neoplasm of upper lobe, right bronchus or lung: Secondary | ICD-10-CM | POA: Diagnosis not present

## 2020-05-12 DIAGNOSIS — D509 Iron deficiency anemia, unspecified: Secondary | ICD-10-CM | POA: Diagnosis not present

## 2020-05-12 DIAGNOSIS — R5383 Other fatigue: Secondary | ICD-10-CM | POA: Insufficient documentation

## 2020-05-12 DIAGNOSIS — E538 Deficiency of other specified B group vitamins: Secondary | ICD-10-CM | POA: Diagnosis not present

## 2020-05-12 DIAGNOSIS — Z7982 Long term (current) use of aspirin: Secondary | ICD-10-CM | POA: Insufficient documentation

## 2020-05-12 DIAGNOSIS — J449 Chronic obstructive pulmonary disease, unspecified: Secondary | ICD-10-CM | POA: Insufficient documentation

## 2020-05-12 DIAGNOSIS — I739 Peripheral vascular disease, unspecified: Secondary | ICD-10-CM | POA: Insufficient documentation

## 2020-05-12 DIAGNOSIS — C3491 Malignant neoplasm of unspecified part of right bronchus or lung: Secondary | ICD-10-CM

## 2020-05-12 DIAGNOSIS — E785 Hyperlipidemia, unspecified: Secondary | ICD-10-CM | POA: Insufficient documentation

## 2020-05-12 DIAGNOSIS — Z79899 Other long term (current) drug therapy: Secondary | ICD-10-CM | POA: Diagnosis not present

## 2020-05-12 DIAGNOSIS — F17210 Nicotine dependence, cigarettes, uncomplicated: Secondary | ICD-10-CM | POA: Insufficient documentation

## 2020-05-12 DIAGNOSIS — I10 Essential (primary) hypertension: Secondary | ICD-10-CM | POA: Diagnosis not present

## 2020-05-12 MED ORDER — LEVOFLOXACIN 500 MG PO TABS: 500.0000 mg | ORAL_TABLET | Freq: Every day | ORAL | 0 refills | Status: DC

## 2020-05-12 NOTE — Progress Notes (Signed)
[place], [place] [address]   CLINIC:  Medical Oncology/Hematology  PCP:  Celene Squibb, MD [address] [place] [address]  [phone]  REASON FOR VISIT:  Follow-up for right lung adenocarcinoma, IDA and vitamin B12 deficiency  PRIOR THERAPY: Vitamin B12 monthly injections through 05/22/2019  CURRENT THERAPY: Intermittent Feraheme; vitamin B12 daily  INTERVAL HISTORY:  [name], a 59 y.o. female, returns for routine follow-up for her right lung adenocarcinoma, IDA and vitamin B12 deficiency. Marsa was last seen on 04/25/2018.  Today she is accompanied by her daughter and she reports feeling well. She finished her azithromycin on 10/24. She is scheduled for her chest CT on 11/16 by [name]. She suspects having a recent infection and has a productive cough with white sputum and sometimes brown sputum. She continues taking vitamin D and B12 daily.  She will see her cardiologist on 11/25.   REVIEW OF SYSTEMS:  Review of Systems  Constitutional: Positive for appetite change (50%) and fatigue (depleted).  Respiratory: Positive for cough (productive w/ white sputum & occasionally brown sputum) and shortness of breath.   Gastrointestinal: Positive for diarrhea.  Musculoskeletal: Positive for arthralgias (7/10 L leg pain).  Neurological: Positive for numbness (L leg).  All other systems reviewed and are negative.   PAST MEDICAL/SURGICAL HISTORY:  Past Medical History:  Diagnosis Date   Anxiety    Arthritis    "hands; spine too" (04/04/2012)   COPD (chronic obstructive pulmonary disease) (HCC)    Exertional dyspnea    Folate deficiency 10/17/2015   GERD (gastroesophageal reflux disease)    Hyperlipemia    Hypertension    Macrocytosis 10/17/2015   Neck pain, chronic    PAD (peripheral artery disease) (HCC)    Renal artery stenosis (HCC)    Seasonal allergies    Tobacco abuse    Past Surgical  History:  Procedure Laterality Date   ABDOMINAL ANGIOGRAM  04/04/2012   Procedure: ABDOMINAL ANGIOGRAM;  Surgeon: Lorretta Harp, MD;  Location: [facility] CATH LAB;  Service: Cardiovascular;;   ANTERIOR CERVICAL DECOMP/DISCECTOMY FUSION  ~ 04/2006   C6, 7, T1   COLONOSCOPY N/A 02/26/2014   Adequate preparation. Minimal anal canal hemorrhoids;otherwise, normal rectumdiminutive polyps in the middescending segment; otherwise, the remainder of colonic mucosaappeared normal. Hyperplastic polyps.   ESOPHAGOGASTRODUODENOSCOPY N/A 02/26/2014   UDJ:SHFWYOVZ distal esophagus, query short segment Barrett's. Status post Venia Minks dilation with esophageal biopsy. Abnormal gastric mucosa. Esophageal biopsy negative for Barrett's. Gastric biopsy showed mild chronic gastritis, no H pylori.   LAPAROSCOPIC APPENDECTOMY N/A 09/10/2013   Procedure: APPENDECTOMY LAPAROSCOPIC;  Surgeon: Jamesetta So, MD;  Location: [facility] ORS;  Service: General;  Laterality: N/A;   LOWER EXTREMITY ANGIOGRAM N/A 04/04/2012   Procedure: LOWER EXTREMITY ANGIOGRAM;  Surgeon: Lorretta Harp, MD;  Location: [facility] CATH LAB;  Service: Cardiovascular;  Laterality: N/A;   MALONEY DILATION N/A 02/26/2014   Procedure: Venia Minks DILATION;  Surgeon: Daneil Dolin, MD;  Location: [facility] ENDO SUITE;  Service: Endoscopy;  Laterality: N/A;   PERIPHERAL ARTERIAL STENT GRAFT  04/04/2012   RENAL ANGIOGRAM Left 04/22/2014   Procedure: RENAL ANGIOGRAM;  Surgeon: Lorretta Harp, MD;  Location: [facility] CATH LAB;  Service: Cardiovascular;  Laterality: Left;   TUBAL LIGATION  1990   VIDEO BRONCHOSCOPY WITH ENDOBRONCHIAL NAVIGATION N/A 04/28/2020   Procedure: VIDEO BRONCHOSCOPY WITH ENDOBRONCHIAL NAVIGATION;  Surgeon: [name], MD;  Location: [place];  Service: Thoracic;  Laterality: N/A;    SOCIAL HISTORY:  Social History   Socioeconomic History   Marital status: Married    Spouse name: Not on file   Number of children: Not on file   Years of  education: Not on file   Highest education level: Not on file  Occupational History   Occupation: Chiropodist: [employer]    Comment: [address]  Tobacco Use   Smoking status: Current Every Day Smoker    Packs/day: 1.00    Years: 44.00    Pack years: 44.00    Types: Cigarettes   Smokeless tobacco: Never Used  Scientific laboratory technician Use: Never used  Substance and Sexual Activity   Alcohol use: No   Drug use: No   Sexual activity: Never  Other Topics Concern   Not on file  Social History Narrative   Not on file   Social Determinants of Health   Financial Resource Strain:    Difficulty of Paying Living Expenses: Not on file  Food Insecurity:    Worried About Charity fundraiser in the Last Year: Not on file   [employer] of Food in the Last Year: Not on file  Transportation Needs:    Lack of Transportation (Medical): Not on file   Lack of Transportation (Non-Medical): Not on file  Physical Activity:    Days of Exercise per Week: Not on file   Minutes of Exercise per Session: Not on file  Stress:    Feeling of Stress : Not on file  Social Connections:    Frequency of Communication with Friends and Family: Not on file   Frequency of Social Gatherings with Friends and Family: Not on file   Attends Religious Services: Not on file   Active Member of Clubs or Organizations: Not on file   Attends Archivist Meetings: Not on file   Marital Status: Not on file  Intimate Partner Violence:    Fear of Current or Ex-Partner: Not on file   Emotionally Abused: Not on file   Physically Abused: Not on file   Sexually Abused: Not on file    FAMILY HISTORY:  Family History  Problem Relation Age of Onset   Leukemia Father    Cancer Father    Heart disease Father    Heart disease Mother    Heart disease Other    Arthritis Other    Asthma Other    Colon cancer Other        unknown, possibly father    CURRENT  MEDICATIONS:  Current Outpatient Medications  Medication Sig Dispense Refill   amLODipine (NORVASC) 5 MG tablet Take 1 tablet (5 mg total) by mouth daily. 180 tablet 3   aspirin EC 81 MG tablet Take 81 mg by mouth daily.     Budeson-Glycopyrrol-Formoterol (BREZTRI AEROSPHERE) 160-9-4.8 MCG/ACT AERO Inhale 2 puffs into the lungs in the morning and at bedtime. Take 2 puffs first thing in am and then another 2 puffs about 12 hours later. (Patient taking differently: Inhale 2 puffs into the lungs in the morning and at bedtime. ) 10.7 g 11   cholecalciferol (VITAMIN D3) 25 MCG (1000 UNIT) tablet Take 1,000 Units by mouth daily.     clopidogrel (PLAVIX) 75 MG tablet Take 1 tablet (75 mg total) by mouth daily. PLEASE SCHEDULE APPOINTMENT FOR FURTHER REFILLS. 2ND ATTEMPT 15 tablet 0   cyclobenzaprine (FLEXERIL) 10 MG tablet Take 10 mg by mouth  3 (three) times daily as needed for muscle spasms.     DEXILANT 60 MG capsule TAKE 1 CAPSULE ONCE DAILY (Patient taking differently: Take 60 mg by mouth daily. ) 90 capsule 1   escitalopram (LEXAPRO) 10 MG tablet Take 10 mg by mouth Daily.     fenofibrate 160 MG tablet Take 160 mg by mouth daily.      folic acid (FOLVITE) 1 MG tablet Take 1 tablet (1 mg total) by mouth daily. 90 tablet 4   metoprolol tartrate (LOPRESSOR) 25 MG tablet Take 1 tablet (25 mg total) by mouth 2 (two) times daily as needed (afib). 60 tablet 0   Red Yeast Rice 600 MG CAPS Take 600 mg by mouth 2 (two) times daily.      rosuvastatin (CRESTOR) 20 MG tablet Take 20 mg by mouth daily.      vitamin B-12 (CYANOCOBALAMIN) 1000 MCG tablet Take 1 tablet (1,000 mcg total) by mouth daily. 60 tablet 2   levofloxacin (LEVAQUIN) 500 MG tablet Take 1 tablet (500 mg total) by mouth daily. 10 tablet 0   No current facility-administered medications for this visit.    ALLERGIES:  Allergies  Allergen Reactions   Latex Rash    Blisters   Codeine Nausea And Vomiting    PHYSICAL EXAM:    Performance status (ECOG): 1 - Symptomatic but completely ambulatory  Vitals:   05/12/20 1412  BP: (!) 162/91  Pulse: 67  Resp: 18  Temp: (!) 97 F (36.1 C)  SpO2: 95%   Wt Readings from Last 3 Encounters:  05/12/20 143 lb 4.8 oz (65 kg)  05/06/20 140 lb (63.5 kg)  04/28/20 141 lb (64 kg)   Physical Exam Vitals reviewed.  Constitutional:      Appearance: Normal appearance.  Cardiovascular:     Rate and Rhythm: Normal rate and regular rhythm.     Pulses: Normal pulses.     Heart sounds: Normal heart sounds.  Pulmonary:     Effort: Pulmonary effort is normal.     Breath sounds: Normal breath sounds.  Neurological:     General: No focal deficit present.     Mental Status: She is alert and oriented to person, place, and time.  Psychiatric:        Mood and Affect: Mood normal.        Behavior: Behavior normal.     LABORATORY DATA:  I have reviewed the labs as listed.  CBC Latest Ref Rng & Units 04/25/2020 04/03/2020 10/01/2019  WBC 4.0 - 10.5 K/uL 7.7 6.2 4.9  Hemoglobin 12.0 - 15.0 g/dL 14.6 14.2 15.9(H)  Hematocrit 36 - 46 % 45.8 45.3 49.3(H)  Platelets 150 - 400 K/uL 312 247 165   CMP Latest Ref Rng & Units 04/25/2020 04/03/2020 10/01/2019  Glucose 70 - 99 mg/dL 93 79 65(L)  BUN 6 - 20 mg/dL 8 12 14   Creatinine 0.44 - 1.00 mg/dL 0.89 0.96 0.99  Sodium 135 - 145 mmol/L 139 136 136  Potassium 3.5 - 5.1 mmol/L 3.1(L) 4.3 4.3  Chloride 98 - 111 mmol/L 101 102 106  CO2 22 - 32 mmol/L 26 25 21(L)  Calcium 8.9 - 10.3 mg/dL 9.9 9.4 9.3  Total Protein 6.5 - 8.1 g/dL 7.6 6.9 7.5  Total Bilirubin 0.3 - 1.2 mg/dL 0.3 0.6 0.5  Alkaline Phos 38 - 126 U/L 103 73 89  AST 15 - 41 U/L 17 12(L) 19  ALT 0 - 44 U/L 13 11 22  Component Value Date/Time   RBC 4.85 04/25/2020 1044   MCV 94.4 04/25/2020 1044   MCH 30.1 04/25/2020 1044   MCHC 31.9 04/25/2020 1044   RDW 13.8 04/25/2020 1044   LYMPHSABS 1.2 04/03/2020 1232   MONOABS 0.3 04/03/2020 1232   EOSABS 0.2 04/03/2020  1232   BASOSABS 0.0 04/03/2020 1232   Lab Results  Component Value Date   VD25OH 19.70 (L) 04/03/2020   Surgical pathology ([id number]) on 04/28/2020: RUL lung biopsy: lung adenocarcinoma.  DIAGNOSTIC IMAGING:  I have independently reviewed the scans and discussed with the patient. DG Chest 2 View  Result Date: 04/25/2020 CLINICAL DATA:  Preop for bronchoscopy. EXAM: CHEST - 2 VIEW COMPARISON:  September 10, 2013. April 14, 2020. April 03, 2020. FINDINGS: The heart size and mediastinal contours are within normal limits. No pneumothorax or pleural effusion is noted. Atelectasis is noted in the anterior portion of left lower lobe as noted on prior CT. Right upper lobe nodule noted on prior exam is not well visualized currently, but may be obscured by overlying rib. The visualized skeletal structures are unremarkable. IMPRESSION: Left lower lobe atelectasis is noted as noted on prior CT. Electronically Signed   By: Marijo Conception M.D.   On: 04/25/2020 11:19   NM PET Image Initial (PI) Skull Base To Thigh  Result Date: 04/15/2020 CLINICAL DATA:  Initial treatment strategy for right upper lobe pulmonary nodule. EXAM: NUCLEAR MEDICINE PET SKULL BASE TO THIGH TECHNIQUE: 8.0 mCi F-18 FDG was injected intravenously. Full-ring PET imaging was performed from the skull base to thigh after the radiotracer. CT data was obtained and used for attenuation correction and anatomic localization. Fasting blood glucose: 102 mg/dl COMPARISON:  Lung cancer screening chest CT 04/03/2020 FINDINGS: Mediastinal blood pool activity: SUV max 2.4 Liver activity: SUV max NA NECK: No hypermetabolic lymph nodes in the neck. Incidental CT findings: none CHEST: Right upper lobe pulmonary nodule of concern on recent lung cancer screening chest CT is hypermetabolic with SUV max = 5.6. Dense masslike consolidative opacity in the anterior left lower lobe is also markedly hypermetabolic with SUV max = 97.0. A hypermetabolic  nodule seen just medial to this dominant lesion is hypermetabolic but somewhat obscured by breathing motion on CT imaging. 7 mm short axis paraesophageal lymph node seen on image 88/series 3 shows low level hypermetabolism with SUV max = 4.7. Focal hypermetabolism in the left hilum demonstrates SUV max = 4.0 although no discrete lymph node is visible on today's noncontrast imaging. Incidental CT findings: Heart size upper normal. Coronary artery calcification is evident. Atherosclerotic calcification is noted in the wall of the thoracic aorta. ABDOMEN/PELVIS: No abnormal hypermetabolic activity within the liver, pancreas, adrenal glands, or spleen. No hypermetabolic lymph nodes in the abdomen or pelvis. Incidental CT findings: Diffuse low attenuation of the liver parenchyma is compatible with fatty deposition. Possible tiny layering gallstone (image 165/series 3). 13 mm right adrenal nodule measures -16 Hounsfield units in shows no hypermetabolism, features most consistent with adenoma. There is abdominal aortic atherosclerosis without aneurysm. Left common iliac artery stent device noted. Small left groin hernia contains only fat. SKELETON: No focal hypermetabolic activity to suggest skeletal metastasis. Incidental CT findings: none IMPRESSION: 1. Right upper lobe pulmonary nodule of concern on recent lung cancer screening CT is hypermetabolic, consistent with neoplasm, likely primary. 2. The anterior left lower lobe dense consolidative opacity is also markedly hypermetabolic. This finding was new in the interval between lung cancer screening CT of 04/03/2020 and 10/01/2019. Given  the rapid interval appearance, infectious/inflammatory etiology favored, but neoplasm not excluded. There is a small hypermetabolic satellite nodule medial to the dominant lesion. 3. Low level hypermetabolism identified in the left hilum and in a normal sized paraesophageal lymph node. This is somewhat indeterminate given the bilateral  lung findings and could be reactive if the left lower lobe process is infectious/inflammatory. 4. 13 mm right adrenal adenoma. 5.  Aortic Atherosclerois (ICD10-170.0) 6.  Emphysema. ([id number]) Electronically Signed   By: Misty Stanley M.D.   On: 04/15/2020 13:17      ASSESSMENT:  1.  Right upper lobe adenocarcinoma: -PET scan on April 14, 2020 shows right upper lobe pulmonary nodule with SUV 5.6.  Dense masslike consolidative opacity in the anterior left lower lobe with SUV 11.7.  Hypermetabolic nodule seen just medial to this dominant lesion is hypermetabolic but somewhat obscured by breathing motion.  7 mm short axis paraesophageal lymph node with SUV 4.7.  Focal hypermetabolism in the left hilum with SUV 4.0.  13 mm right adrenal adenoma. -Navigational bronchoscopy by [name] showed non-small cell carcinoma, adenocarcinoma of the right upper lobe nodule.  Right middle lobe nodule nondiagnostic.  Left lower lobe infiltrate/question mass nondiagnostic.  2.  Iron deficiency state: - On intermittent parenteral iron therapy since 2015. -Last Feraheme on December 28, 2018. -Latest CBC shows hemoglobin 14.6.  Last ferritin 101 on April 03, 2020.  Percent saturation is 13.  3.  B12 deficiency: -She received B12 injections on May 22, 2019.  PLAN:  1.  Right upper lobe adenocarcinoma: -She completed azithromycin for 5 days. -I will give her Levaquin 500 milligrams for 10 days as she is still coughing and having some sputum. -She will follow up with [name] on June 03, 2020 with a repeat scan. -Surgical plans based on scan results and response of left lower lobe consolidation to antibiotics. -Plan to see her back around 18 November.  2.  Iron deficiency state: -Latest hemoglobin is 14.6.  Latest ferritin is 101 on April 03, 2020.  No indication for parenteral iron.  3.  B12 deficiency: -Not on B12 at this time.  Last B12 was 924 on April 03, 2020.  We  will plan to check B12 and iron levels at next visit.   Orders placed this encounter:  No orders of the defined types were placed in this encounter.    Derek Jack, MD [place] [phone]   I, Milinda Antis, am acting as a scribe for Dr. Sanda Linger.  I, Derek Jack MD, have reviewed the above documentation for accuracy and completeness, and I agree with the above.

## 2020-05-12 NOTE — Patient Instructions (Signed)
Connie Farrell at [facility] Discharge Instructions  You were seen today by Dr. Delton Coombes. He went over your recent results and scans. You will be prescribed Levaquin to take once daily for 10 days. Dr. Delton Coombes will see you back after November 16th for follow up.   Thank you for choosing [place] at [facility] to provide your oncology and hematology care.  To afford each patient quality time with our provider, please arrive at least 15 minutes before your scheduled appointment time.   If you have a lab appointment with the [facility] please come in thru the Main Entrance and check in at the main information desk  You need to re-schedule your appointment should you arrive 10 or more minutes late.  We strive to give you quality time with our providers, and arriving late affects you and other patients whose appointments are after yours.  Also, if you no show three or more times for appointments you may be dismissed from the clinic at the providers discretion.     Again, thank you for choosing [facility].  Our hope is that these requests will decrease the amount of time that you wait before being seen by our physicians.       _____________________________________________________________  Should you have questions after your visit to [facility], please contact our office at [phone] between the hours of 8:00 a.m. and 4:30 p.m.  Voicemails left after 4:00 p.m. will not be returned until the following business day.  For prescription refill requests, have your pharmacy contact our office and allow 72 hours.    Cancer Center Support Programs:   > Cancer Support Group  2nd Tuesday of the month 1pm-2pm, Journey Room

## 2020-05-19 LAB — CULTURE, FUNGUS WITHOUT SMEAR

## 2020-05-23 ENCOUNTER — Other Ambulatory Visit (HOSPITAL_COMMUNITY): Payer: Commercial Managed Care - PPO

## 2020-05-27 ENCOUNTER — Encounter (HOSPITAL_COMMUNITY): Payer: Commercial Managed Care - PPO

## 2020-06-02 ENCOUNTER — Inpatient Hospital Stay (HOSPITAL_COMMUNITY): Payer: Commercial Managed Care - PPO | Attending: Hematology

## 2020-06-02 DIAGNOSIS — I739 Peripheral vascular disease, unspecified: Secondary | ICD-10-CM | POA: Insufficient documentation

## 2020-06-02 DIAGNOSIS — I10 Essential (primary) hypertension: Secondary | ICD-10-CM | POA: Insufficient documentation

## 2020-06-02 DIAGNOSIS — K219 Gastro-esophageal reflux disease without esophagitis: Secondary | ICD-10-CM | POA: Insufficient documentation

## 2020-06-02 DIAGNOSIS — C3411 Malignant neoplasm of upper lobe, right bronchus or lung: Secondary | ICD-10-CM | POA: Insufficient documentation

## 2020-06-02 DIAGNOSIS — F17210 Nicotine dependence, cigarettes, uncomplicated: Secondary | ICD-10-CM | POA: Insufficient documentation

## 2020-06-02 DIAGNOSIS — M1990 Unspecified osteoarthritis, unspecified site: Secondary | ICD-10-CM | POA: Insufficient documentation

## 2020-06-02 DIAGNOSIS — Z23 Encounter for immunization: Secondary | ICD-10-CM | POA: Insufficient documentation

## 2020-06-02 DIAGNOSIS — J449 Chronic obstructive pulmonary disease, unspecified: Secondary | ICD-10-CM | POA: Insufficient documentation

## 2020-06-02 DIAGNOSIS — R5383 Other fatigue: Secondary | ICD-10-CM | POA: Insufficient documentation

## 2020-06-02 DIAGNOSIS — E785 Hyperlipidemia, unspecified: Secondary | ICD-10-CM | POA: Insufficient documentation

## 2020-06-02 DIAGNOSIS — D509 Iron deficiency anemia, unspecified: Secondary | ICD-10-CM | POA: Insufficient documentation

## 2020-06-02 DIAGNOSIS — Z79899 Other long term (current) drug therapy: Secondary | ICD-10-CM | POA: Insufficient documentation

## 2020-06-02 DIAGNOSIS — E538 Deficiency of other specified B group vitamins: Secondary | ICD-10-CM | POA: Insufficient documentation

## 2020-06-03 ENCOUNTER — Encounter: Payer: Self-pay | Admitting: Thoracic Surgery (Cardiothoracic Vascular Surgery)

## 2020-06-03 ENCOUNTER — Other Ambulatory Visit: Payer: Self-pay | Admitting: *Deleted

## 2020-06-03 ENCOUNTER — Encounter: Payer: Self-pay | Admitting: *Deleted

## 2020-06-03 ENCOUNTER — Other Ambulatory Visit: Payer: Self-pay

## 2020-06-03 ENCOUNTER — Ambulatory Visit
Admission: RE | Admit: 2020-06-03 | Discharge: 2020-06-03 | Disposition: A | Discharge status: Home / Self Care | Payer: Commercial Managed Care - PPO | Source: Ambulatory Visit | Attending: Thoracic Surgery (Cardiothoracic Vascular Surgery) | Admitting: Thoracic Surgery (Cardiothoracic Vascular Surgery)

## 2020-06-03 ENCOUNTER — Ambulatory Visit (INDEPENDENT_AMBULATORY_CARE_PROVIDER_SITE_OTHER): Payer: Commercial Managed Care - PPO | Admitting: Thoracic Surgery (Cardiothoracic Vascular Surgery)

## 2020-06-03 VITALS — BP 157/88 | HR 68 | Resp 18 | Ht 64.0 in | Wt 145.0 lb

## 2020-06-03 DIAGNOSIS — R911 Solitary pulmonary nodule: Secondary | ICD-10-CM

## 2020-06-03 DIAGNOSIS — Z9889 Other specified postprocedural states: Secondary | ICD-10-CM

## 2020-06-03 NOTE — Patient Instructions (Signed)
Stop Plavix after your dose on Tuesday 11/23

## 2020-06-03 NOTE — Progress Notes (Signed)
[address]       [place] [address]             [phone]     HPI: Connie Farrell returns for a scheduled follow-up visit  Connie Farrell is a 59 year old woman with a medical history significant for tobacco abuse, lung cancer, hypertension, hyperlipidemia, atherosclerotic cardiovascular disease, PAD with iliac stent, renal artery stenosis, reflux, arthritis, macrocytic anemia, and anxiety.  She was found to have a right upper lobe lung nodule on a low-dose screening CT in September 2019.  That was followed with serial CTs.  In September she was noted to have an increase in size of the right upper lobe nodule.  There was a new 8 mm nodule in the right middle lobe and there was a new area of consolidation in the anterior lower lobe.  On PET CT the right upper lobe nodule was hypermetabolic and there was marked hypermetabolic activity in the left lower lobe area.  Given that there were no previous findings in the lower lobe that was felt to be infectious or inflammatory in nature.  I did a navigational bronchoscopy on 04/28/2020 biopsies from the right upper lobe nodule showed non-small cell carcinoma.  The middle lobe samples were nondiagnostic.  The lower lobe samples were negative for malignancy but were nonspecific.  I recommended a course of antibiotics and a follow-up of the left lower lobe process before definitive treatment of the right upper lobe nodule.  She was treated with a 5-day course of Zithromax and then also received another week of levofloxacin.  She did develop thrush after that.  She continues to smoke about a pack of cigarettes daily.  She has been trying to cut down.  No chest pain, tightness, pressure or shortness of breath.  She remains very anxious.  She does complain of left leg pain which extends from her hip down into her foot.  Past Medical History:  Diagnosis Date  . Anxiety   . Arthritis    "hands; spine too" (04/04/2012)  . COPD (chronic  obstructive pulmonary disease) ([place])   . Exertional dyspnea   . Folate deficiency 10/17/2015  . GERD (gastroesophageal reflux disease)   . Hyperlipemia   . Hypertension   . Macrocytosis 10/17/2015  . Neck pain, chronic   . PAD (peripheral artery disease) ([place])   . Renal artery stenosis ([place])   . Seasonal allergies   . Tobacco abuse    Past Surgical History:  Procedure Laterality Date  . ABDOMINAL ANGIOGRAM  04/04/2012   Procedure: ABDOMINAL ANGIOGRAM;  Surgeon: Lorretta Harp, MD;  Location: [facility] CATH LAB;  Service: Cardiovascular;;  . ANTERIOR CERVICAL DECOMP/DISCECTOMY FUSION  ~ 04/2006   C6, 7, T1  . COLONOSCOPY N/A 02/26/2014   Adequate preparation. Minimal anal canal hemorrhoids;otherwise, normal rectumdiminutive polyps in the middescending segment; otherwise, the remainder of colonic mucosaappeared normal. Hyperplastic polyps.  . ESOPHAGOGASTRODUODENOSCOPY N/A 02/26/2014   OIZ:[id number] distal esophagus, query short segment Barrett's. Status post Venia Minks dilation with esophageal biopsy. Abnormal gastric mucosa. Esophageal biopsy negative for Barrett's. Gastric biopsy showed mild chronic gastritis, no H pylori.  Marland Kitchen LAPAROSCOPIC APPENDECTOMY N/A 09/10/2013   Procedure: APPENDECTOMY LAPAROSCOPIC;  Surgeon: Jamesetta So, MD;  Location: [facility] ORS;  Service: General;  Laterality: N/A;  . LOWER EXTREMITY ANGIOGRAM N/A 04/04/2012   Procedure: LOWER EXTREMITY ANGIOGRAM;  Surgeon: Lorretta Harp, MD;  Location: [facility] CATH LAB;  Service: Cardiovascular;  Laterality: N/A;  . MALONEY DILATION  N/A 02/26/2014   Procedure: Venia Minks DILATION;  Surgeon: Daneil Dolin, MD;  Location: [facility] ENDO SUITE;  Service: Endoscopy;  Laterality: N/A;  . PERIPHERAL ARTERIAL STENT GRAFT  04/04/2012  . RENAL ANGIOGRAM Left 04/22/2014   Procedure: RENAL ANGIOGRAM;  Surgeon: Lorretta Harp, MD;  Location: [facility] CATH LAB;  Service: Cardiovascular;  Laterality: Left;  . TUBAL LIGATION  1990  . VIDEO BRONCHOSCOPY WITH ENDOBRONCHIAL  NAVIGATION N/A 04/28/2020   Procedure: VIDEO BRONCHOSCOPY WITH ENDOBRONCHIAL NAVIGATION;  Surgeon: [name], MD;  Location: [facility];  Service: Thoracic;  Laterality: N/A;   Social History   Socioeconomic History  . Marital status: Married    Spouse name: Not on file  . Number of children: Not on file  . Years of education: Not on file  . Highest education level: Not on file  Occupational History  . Occupation: Chiropodist: [employer]    Comment: [address]  Tobacco Use  . Smoking status: Current Every Day Smoker    Packs/day: 1.00    Years: 44.00    Pack years: 44.00    Types: Cigarettes  . Smokeless tobacco: Never Used  Vaping Use  . Vaping Use: Never used  Substance and Sexual Activity  . Alcohol use: No  . Drug use: No  . Sexual activity: Never  Other Topics Concern  . Not on file  Social History Narrative  . Not on file   Social Determinants of Health   Financial Resource Strain:   . Difficulty of Paying Living Expenses: Not on file  Food Insecurity:   . Worried About Charity fundraiser in the Last Year: Not on file  . Ran Out of Food in the Last Year: Not on file  Transportation Needs:   . Lack of Transportation (Medical): Not on file  . Lack of Transportation (Non-Medical): Not on file  Physical Activity:   . Days of Exercise per Week: Not on file  . Minutes of Exercise per Session: Not on file  Stress:   . Feeling of Stress : Not on file  Social Connections:   . Frequency of Communication with Friends and Family: Not on file  . Frequency of Social Gatherings with Friends and Family: Not on file  . Attends Religious Services: Not on file  . Active Member of Clubs or Organizations: Not on file  . Attends Archivist Meetings: Not on file  . Marital Status: Not on file  Intimate Partner Violence:   . Fear of Current or Ex-Partner: Not on file  . Emotionally Abused: Not on file  . Physically Abused: Not on file    . Sexually Abused: Not on file    Current Outpatient Medications  Medication Sig Dispense Refill  . amLODipine (NORVASC) 5 MG tablet Take 1 tablet (5 mg total) by mouth daily. 180 tablet 3  . aspirin EC 81 MG tablet Take 81 mg by mouth daily.    . Budeson-Glycopyrrol-Formoterol (BREZTRI AEROSPHERE) 160-9-4.8 MCG/ACT AERO Inhale 2 puffs into the lungs in the morning and at bedtime. Take 2 puffs first thing in am and then another 2 puffs about 12 hours later. (Patient taking differently: Inhale 2 puffs into the lungs in the morning and at bedtime. ) 10.7 g 11  . cholecalciferol (VITAMIN D3) 25 MCG (1000 UNIT) tablet Take 1,000 Units by mouth daily.    . clopidogrel (PLAVIX) 75 MG tablet Take 1 tablet (75 mg total) by mouth  daily. PLEASE SCHEDULE APPOINTMENT FOR FURTHER REFILLS. 2ND ATTEMPT 15 tablet 0  . cyclobenzaprine (FLEXERIL) 10 MG tablet Take 10 mg by mouth 3 (three) times daily as needed for muscle spasms.    . DEXILANT 60 MG capsule TAKE 1 CAPSULE ONCE DAILY (Patient taking differently: Take 60 mg by mouth daily. ) 90 capsule 1  . escitalopram (LEXAPRO) 10 MG tablet Take 10 mg by mouth Daily.    . fenofibrate 160 MG tablet Take 160 mg by mouth daily.     . folic acid (FOLVITE) 1 MG tablet Take 1 tablet (1 mg total) by mouth daily. 90 tablet 4  . levofloxacin (LEVAQUIN) 500 MG tablet Take 1 tablet (500 mg total) by mouth daily. 10 tablet 0  . metoprolol tartrate (LOPRESSOR) 25 MG tablet Take 1 tablet (25 mg total) by mouth 2 (two) times daily as needed (afib). 60 tablet 0  . Red Yeast Rice 600 MG CAPS Take 600 mg by mouth 2 (two) times daily.     . rosuvastatin (CRESTOR) 20 MG tablet Take 20 mg by mouth daily.     . vitamin B-12 (CYANOCOBALAMIN) 1000 MCG tablet Take 1 tablet (1,000 mcg total) by mouth daily. 60 tablet 2   No current facility-administered medications for this visit.    Physical Exam BP (!) 157/88 (BP Location: Left Arm, Patient Position: Sitting)   Pulse 68   Resp 18    Ht 5\' 4"  (1.626 m)   Wt 145 lb (65.8 kg)   SpO2 93% Comment: RA with mask on  BMI 24.75 kg/m  59 year old woman in no acute distress Alert and oriented x3 with no focal deficits No cervical or supraclavicular adenopathy Cardiac regular rate and rhythm, normal S1 and S2 with no rubs murmurs or gallops Lungs clear bilaterally, no rales or wheezing Extremities no clubbing cyanosis or edema  Diagnostic Tests: CT CHEST WITHOUT CONTRAST  TECHNIQUE: Multidetector CT imaging of the chest was performed following the standard protocol without IV contrast.  COMPARISON:  PET-CT 04/15/2019  FINDINGS: Cardiovascular: The heart size appears within normal limits. There is no pericardial effusion. Mild aortic atherosclerosis. Coronary artery atherosclerotic calcifications.  Mediastinum/Nodes: Normal appearance of the thyroid gland. The trachea appears patent and is midline. Normal appearance of the esophagus. No enlarged axillary, supraclavicular, or mediastinal adenopathy. Hilar lymph nodes are suboptimally evaluated due to lack of IV contrast material.  Lungs/Pleura: As mentioned previously there is heterogeneous diffuse ground-glass and ill-defined centrilobular micro nodularity. No pleural effusion identified. Subpleural consolidation within the anterior left lower lobe measures 3.1 x 1.5 cm, image 87/8. Previously this measured 3.7 x 2.6 cm.  Multiple lung nodules are again identified:  -the solid nodule within the periphery of the right upper lobe measures 1.1 cm, image 63/8. Previously 1 cm.  -Right middle lobe lung nodule measures 0.9 cm, image 79/8. Unchanged.  -3 mm right lower lobe lung nodule is noted, image 81/8.  Unchanged.  -Within the right upper lobe there is a 4 mm sub solid nodule, image 55/8. Also stable.  -Small solid nodule within the posterior right upper lobe measures 4 mm, image 32/8. Stable.  No new or enlarging lung nodules  Upper  Abdomen: No acute abnormality.  Musculoskeletal: Status post ACDF. Multi level spondylosis noted within the thoracic spine. No acute or suspicious osseous findings.  IMPRESSION: 1. No significant change in size of right lung nodules. No new suspicious lung nodules identified. 2. Decrease in size of subpleural consolidation within the anterior left lower  lobe likely reflecting resolving infection. 3. Similar appearance of diffuse ground-glass and ill-defined centrilobular micro nodularity. Findings are nonspecific and may be seen with respiratory bronchiolitis interstitial lung disease (RB-ILD). 4. Coronary artery atherosclerotic calcifications. 5. Aortic atherosclerosis.  Aortic Atherosclerosis (ICD10-I70.0).   Electronically Signed   By: Kerby Moors M.D.   On: 06/03/2020 10:14 I personally reviewed the CT images and concur with the findings noted above  Impression: Connie Farrell is a 59 year old woman with a medical history significant for tobacco abuse, lung cancer, hypertension, hyperlipidemia, atherosclerotic cardiovascular disease, PAD with iliac stent, renal artery stenosis, reflux, arthritis, macrocytic anemia, and anxiety.  She has a proven non-small cell carcinoma in the inferior aspect of the right upper lobe.  There is a subsolid nodule in the right middle lobe that is highly suspicious for a primary bronchogenic carcinoma as well.  Sampling of that nodule on bronchoscopy was nondiagnostic.  Finally there is a consolidative process in the left lower lobe that appears to be infectious or inflammatory in nature.  We cannot totally rule out the possibility of an endobronchial obstructing lesion.  However bronchoscopy was negative.  Interval CT shows some improvement but not complete resolution of the left lower lobe process.  I think given that we need to go ahead and treat her known non-small cell carcinoma in the right upper lobe definitively.  We will need to keep an  eye on the left lower lobe.  At the time of resection for right upper lobe nodule also do a wedge resection of the middle lobe nodule to get a definitive diagnosis on that.  To do so we will need to mark that nodule with navigational bronchoscopy.  We will also take another look down the left side while we are in there with the scope.  I discussed the proposed procedure of navigational bronchoscopy followed by right robotic VATS for wedge resection of middle lobe nodule and segmentectomy of the upper lobe.  I informed her of the indications, risks, benefits, and alternatives.  She understands the general nature of the procedure including the need for general anesthesia, the incisions to be used, the use of drains to postoperatively, the expected hospital stay, and the overall recovery.  She understands the risks include, but not limited to death, MI, DVT, PE, bleeding, possible need for transfusion, infection, prolonged air leak, cardiac arrhythmias, as well as the possibility of other unforeseeable complications.  Tobacco abuse-I was very frank with her in regards to her ongoing tobacco abuse.  She needs to quit smoking or she will have recurrence or new lung cancer.  Plan: ENB for marking, robotic right VATS for right middle lobe wedge resection and right upper lobe segmentectomy on Monday 06/16/2020 She will need a follow-up scan in about 3 months for the left lower lobe lung nodule.  [name], MD [facility] [phone]

## 2020-06-03 NOTE — H&P (View-Only) (Signed)
[address]       [place] [address]             [phone]     HPI: [name] returns for a scheduled follow-up visit  [name] is a 59 year old woman with a medical history significant for tobacco abuse, lung cancer, hypertension, hyperlipidemia, atherosclerotic cardiovascular disease, PAD with iliac stent, renal artery stenosis, reflux, arthritis, macrocytic anemia, and anxiety.  She was found to have a right upper lobe lung nodule on a low-dose screening CT in September 2019.  That was followed with serial CTs.  In September she was noted to have an increase in size of the right upper lobe nodule.  There was a new 8 mm nodule in the right middle lobe and there was a new area of consolidation in the anterior lower lobe.  On PET CT the right upper lobe nodule was hypermetabolic and there was marked hypermetabolic activity in the left lower lobe area.  Given that there were no previous findings in the lower lobe that was felt to be infectious or inflammatory in nature.  I did a navigational bronchoscopy on 04/28/2020 biopsies from the right upper lobe nodule showed non-small cell carcinoma.  The middle lobe samples were nondiagnostic.  The lower lobe samples were negative for malignancy but were nonspecific.  I recommended a course of antibiotics and a follow-up of the left lower lobe process before definitive treatment of the right upper lobe nodule.  She was treated with a 5-day course of Zithromax and then also received another week of levofloxacin.  She did develop thrush after that.  She continues to smoke about a pack of cigarettes daily.  She has been trying to cut down.  No chest pain, tightness, pressure or shortness of breath.  She remains very anxious.  She does complain of left leg pain which extends from her hip down into her foot.  Past Medical History:  Diagnosis Date  . Anxiety   . Arthritis    "hands; spine too" (04/04/2012)  . COPD (chronic  obstructive pulmonary disease) ([place])   . Exertional dyspnea   . Folate deficiency 10/17/2015  . GERD (gastroesophageal reflux disease)   . Hyperlipemia   . Hypertension   . Macrocytosis 10/17/2015  . Neck pain, chronic   . PAD (peripheral artery disease) ([place])   . Renal artery stenosis ([place])   . Seasonal allergies   . Tobacco abuse    Past Surgical History:  Procedure Laterality Date  . ABDOMINAL ANGIOGRAM  04/04/2012   Procedure: ABDOMINAL ANGIOGRAM;  Surgeon: Lorretta Harp, MD;  Location[facility] CATH LAB;  Service: Cardiovascular;;  . ANTERIOR CERVICAL DECOMP/DISCECTOMY FUSION  ~ 04/2006   C6, 7, T1  . COLONOSCOPY N/A 02/26/2014   Adequate preparation. Minimal anal canal hemorrhoids;otherwise, normal rectumdiminutive polyps in the middescending segment; otherwise, the remainder of colonic mucosaappeared normal. Hyperplastic polyps.  . ESOPHAGOGASTRODUODENOSCOPY N/A 02/26/2014   [id number] distal esophagus, query short segment Barrett's. Status post Venia Minks dilation with esophageal biopsy. Abnormal gastric mucosa. Esophageal biopsy negative for Barrett's. Gastric biopsy showed mild chronic gastritis, no H pylori.  Marland Kitchen LAPAROSCOPIC APPENDECTOMY N/A 09/10/2013   Procedure: APPENDECTOMY LAPAROSCOPIC;  Surgeon: Jamesetta So, MD;  Location: [facility] ORS;  Service: General;  Laterality: N/A;  . LOWER EXTREMITY ANGIOGRAM N/A 04/04/2012   Procedure: LOWER EXTREMITY ANGIOGRAM;  Surgeon: Lorretta Harp, MD;  Location: [facility] CATH LAB;  Service: Cardiovascular;  Laterality: N/A;  . MALONEY DILATION  N/A 02/26/2014   Procedure: Venia Minks DILATION;  Surgeon: Daneil Dolin, MD;  Location: [facility] ENDO SUITE;  Service: Endoscopy;  Laterality: N/A;  . PERIPHERAL ARTERIAL STENT GRAFT  04/04/2012  . RENAL ANGIOGRAM Left 04/22/2014   Procedure: RENAL ANGIOGRAM;  Surgeon: Lorretta Harp, MD;  Location: [facility] CATH LAB;  Service: Cardiovascular;  Laterality: Left;  . TUBAL LIGATION  1990  . VIDEO BRONCHOSCOPY WITH ENDOBRONCHIAL  NAVIGATION N/A 04/28/2020   Procedure: VIDEO BRONCHOSCOPY WITH ENDOBRONCHIAL NAVIGATION;  Surgeon: [name], MD;  Location: [facility];  Service: Thoracic;  Laterality: N/A;   Social History   Socioeconomic History  . Marital status: Married    Spouse name: Not on file  . Number of children: Not on file  . Years of education: Not on file  . Highest education level: Not on file  Occupational History  . Occupation: Chiropodist: [employer]    Comment: [address]  Tobacco Use  . Smoking status: Current Every Day Smoker    Packs/day: 1.00    Years: 44.00    Pack years: 44.00    Types: Cigarettes  . Smokeless tobacco: Never Used  Vaping Use  . Vaping Use: Never used  Substance and Sexual Activity  . Alcohol use: No  . Drug use: No  . Sexual activity: Never  Other Topics Concern  . Not on file  Social History Narrative  . Not on file   Social Determinants of Health   Financial Resource Strain:   . Difficulty of Paying Living Expenses: Not on file  Food Insecurity:   . Worried About Charity fundraiser in the Last Year: Not on file  . Ran Out of Food in the Last Year: Not on file  Transportation Needs:   . Lack of Transportation (Medical): Not on file  . Lack of Transportation (Non-Medical): Not on file  Physical Activity:   . Days of Exercise per Week: Not on file  . Minutes of Exercise per Session: Not on file  Stress:   . Feeling of Stress : Not on file  Social Connections:   . Frequency of Communication with Friends and Family: Not on file  . Frequency of Social Gatherings with Friends and Family: Not on file  . Attends Religious Services: Not on file  . Active Member of Clubs or Organizations: Not on file  . Attends Archivist Meetings: Not on file  . Marital Status: Not on file  Intimate Partner Violence:   . Fear of Current or Ex-Partner: Not on file  . Emotionally Abused: Not on file  . Physically Abused: Not on file   . Sexually Abused: Not on file    Current Outpatient Medications  Medication Sig Dispense Refill  . amLODipine (NORVASC) 5 MG tablet Take 1 tablet (5 mg total) by mouth daily. 180 tablet 3  . aspirin EC 81 MG tablet Take 81 mg by mouth daily.    . Budeson-Glycopyrrol-Formoterol (BREZTRI AEROSPHERE) 160-9-4.8 MCG/ACT AERO Inhale 2 puffs into the lungs in the morning and at bedtime. Take 2 puffs first thing in am and then another 2 puffs about 12 hours later. (Patient taking differently: Inhale 2 puffs into the lungs in the morning and at bedtime. ) 10.7 g 11  . cholecalciferol (VITAMIN D3) 25 MCG (1000 UNIT) tablet Take 1,000 Units by mouth daily.    . clopidogrel (PLAVIX) 75 MG tablet Take 1 tablet (75 mg total) by mouth daily.  PLEASE SCHEDULE APPOINTMENT FOR FURTHER REFILLS. 2ND ATTEMPT 15 tablet 0  . cyclobenzaprine (FLEXERIL) 10 MG tablet Take 10 mg by mouth 3 (three) times daily as needed for muscle spasms.    . DEXILANT 60 MG capsule TAKE 1 CAPSULE ONCE DAILY (Patient taking differently: Take 60 mg by mouth daily. ) 90 capsule 1  . escitalopram (LEXAPRO) 10 MG tablet Take 10 mg by mouth Daily.    . fenofibrate 160 MG tablet Take 160 mg by mouth daily.     . folic acid (FOLVITE) 1 MG tablet Take 1 tablet (1 mg total) by mouth daily. 90 tablet 4  . levofloxacin (LEVAQUIN) 500 MG tablet Take 1 tablet (500 mg total) by mouth daily. 10 tablet 0  . metoprolol tartrate (LOPRESSOR) 25 MG tablet Take 1 tablet (25 mg total) by mouth 2 (two) times daily as needed (afib). 60 tablet 0  . Red Yeast Rice 600 MG CAPS Take 600 mg by mouth 2 (two) times daily.     . rosuvastatin (CRESTOR) 20 MG tablet Take 20 mg by mouth daily.     . vitamin B-12 (CYANOCOBALAMIN) 1000 MCG tablet Take 1 tablet (1,000 mcg total) by mouth daily. 60 tablet 2   No current facility-administered medications for this visit.    Physical Exam BP (!) 157/88 (BP Location: Left Arm, Patient Position: Sitting)   Pulse 68   Resp 18    Ht 5\' 4"  (1.626 m)   Wt 145 lb (65.8 kg)   SpO2 93% Comment: RA with mask on  BMI 24.63 kg/m  59 year old woman in no acute distress Alert and oriented x3 with no focal deficits No cervical or supraclavicular adenopathy Cardiac regular rate and rhythm, normal S1 and S2 with no rubs murmurs or gallops Lungs clear bilaterally, no rales or wheezing Extremities no clubbing cyanosis or edema  Diagnostic Tests: CT CHEST WITHOUT CONTRAST  TECHNIQUE: Multidetector CT imaging of the chest was performed following the standard protocol without IV contrast.  COMPARISON:  PET-CT 04/15/2019  FINDINGS: Cardiovascular: The heart size appears within normal limits. There is no pericardial effusion. Mild aortic atherosclerosis. Coronary artery atherosclerotic calcifications.  Mediastinum/Nodes: Normal appearance of the thyroid gland. The trachea appears patent and is midline. Normal appearance of the esophagus. No enlarged axillary, supraclavicular, or mediastinal adenopathy. Hilar lymph nodes are suboptimally evaluated due to lack of IV contrast material.  Lungs/Pleura: As mentioned previously there is heterogeneous diffuse ground-glass and ill-defined centrilobular micro nodularity. No pleural effusion identified. Subpleural consolidation within the anterior left lower lobe measures 3.1 x 1.5 cm, image 87/8. Previously this measured 3.7 x 2.6 cm.  Multiple lung nodules are again identified:  -the solid nodule within the periphery of the right upper lobe measures 1.1 cm, image 63/8. Previously 1 cm.  -Right middle lobe lung nodule measures 0.9 cm, image 79/8. Unchanged.  -3 mm right lower lobe lung nodule is noted, image 81/8.  Unchanged.  -Within the right upper lobe there is a 4 mm sub solid nodule, image 55/8. Also stable.  -Small solid nodule within the posterior right upper lobe measures 4 mm, image 32/8. Stable.  No new or enlarging lung nodules  Upper  Abdomen: No acute abnormality.  Musculoskeletal: Status post ACDF. Multi level spondylosis noted within the thoracic spine. No acute or suspicious osseous findings.  IMPRESSION: 1. No significant change in size of right lung nodules. No new suspicious lung nodules identified. 2. Decrease in size of subpleural consolidation within the anterior left lower lobe  likely reflecting resolving infection. 3. Similar appearance of diffuse ground-glass and ill-defined centrilobular micro nodularity. Findings are nonspecific and may be seen with respiratory bronchiolitis interstitial lung disease (RB-ILD). 4. Coronary artery atherosclerotic calcifications. 5. Aortic atherosclerosis.  Aortic Atherosclerosis (ICD10-I70.0).   Electronically Signed   By: Kerby Moors M.D.   On: 06/03/2020 10:14 I personally reviewed the CT images and concur with the findings noted above  Impression: Connie Farrell is a 59 year old woman with a medical history significant for tobacco abuse, lung cancer, hypertension, hyperlipidemia, atherosclerotic cardiovascular disease, PAD with iliac stent, renal artery stenosis, reflux, arthritis, macrocytic anemia, and anxiety.  She has a proven non-small cell carcinoma in the inferior aspect of the right upper lobe.  There is a subsolid nodule in the right middle lobe that is highly suspicious for a primary bronchogenic carcinoma as well.  Sampling of that nodule on bronchoscopy was nondiagnostic.  Finally there is a consolidative process in the left lower lobe that appears to be infectious or inflammatory in nature.  We cannot totally rule out the possibility of an endobronchial obstructing lesion.  However bronchoscopy was negative.  Interval CT shows some improvement but not complete resolution of the left lower lobe process.  I think given that we need to go ahead and treat her known non-small cell carcinoma in the right upper lobe definitively.  We will need to keep an  eye on the left lower lobe.  At the time of resection for right upper lobe nodule also do a wedge resection of the middle lobe nodule to get a definitive diagnosis on that.  To do so we will need to mark that nodule with navigational bronchoscopy.  We will also take another look down the left side while we are in there with the scope.  I discussed the proposed procedure of navigational bronchoscopy followed by right robotic VATS for wedge resection of middle lobe nodule and segmentectomy of the upper lobe.  I informed her of the indications, risks, benefits, and alternatives.  She understands the general nature of the procedure including the need for general anesthesia, the incisions to be used, the use of drains to postoperatively, the expected hospital stay, and the overall recovery.  She understands the risks include, but not limited to death, MI, DVT, PE, bleeding, possible need for transfusion, infection, prolonged air leak, cardiac arrhythmias, as well as the possibility of other unforeseeable complications.  Tobacco abuse-I was very frank with her in regards to her ongoing tobacco abuse.  She needs to quit smoking or she will have recurrence or new lung cancer.  Plan: ENB for marking, robotic right VATS for right middle lobe wedge resection and right upper lobe segmentectomy on Monday 06/16/2020 She will need a follow-up scan in about 3 months for the left lower lobe lung nodule.  [name], MD [facility] [phone]

## 2020-06-05 NOTE — Progress Notes (Signed)
Cardiology Clinic Note   Patient Name: Connie Farrell Date of Encounter: 06/11/2020  Primary Care Provider:  Celene Squibb, MD Primary Cardiologist:  Quay Burow, MD  Patient Profile    Connie Farrell 59 year old female presents the clinic today for follow-up evaluation of her hypertension and PAD.  Past Medical History    Past Medical History:  Diagnosis Date  . Anxiety   . Arthritis    "hands; spine too" (04/04/2012)  . COPD (chronic obstructive pulmonary disease) ([place])   . Exertional dyspnea   . Folate deficiency 10/17/2015  . GERD (gastroesophageal reflux disease)   . Hyperlipemia   . Hypertension   . Macrocytosis 10/17/2015  . Neck pain, chronic   . PAD (peripheral artery disease) ([place])   . Renal artery stenosis ([place])   . Seasonal allergies   . Tobacco abuse    Past Surgical History:  Procedure Laterality Date  . ABDOMINAL ANGIOGRAM  04/04/2012   Procedure: ABDOMINAL ANGIOGRAM;  Surgeon: Lorretta Harp, MD;  Location: [facility] CATH LAB;  Service: Cardiovascular;;  . ANTERIOR CERVICAL DECOMP/DISCECTOMY FUSION  ~ 04/2006   C6, 7, T1  . COLONOSCOPY N/A 02/26/2014   Adequate preparation. Minimal anal canal hemorrhoids;otherwise, normal rectumdiminutive polyps in the middescending segment; otherwise, the remainder of colonic mucosaappeared normal. Hyperplastic polyps.  . ESOPHAGOGASTRODUODENOSCOPY N/A 02/26/2014   [facility]:ENIDPOEU distal esophagus, query short segment Barrett's. Status post Venia Minks dilation with esophageal biopsy. Abnormal gastric mucosa. Esophageal biopsy negative for Barrett's. Gastric biopsy showed mild chronic gastritis, no H pylori.  Marland Kitchen LAPAROSCOPIC APPENDECTOMY N/A 09/10/2013   Procedure: APPENDECTOMY LAPAROSCOPIC;  Surgeon: Jamesetta So, MD;  Location: [facility] ORS;  Service: General;  Laterality: N/A;  . LOWER EXTREMITY ANGIOGRAM N/A 04/04/2012   Procedure: LOWER EXTREMITY ANGIOGRAM;  Surgeon: Lorretta Harp, MD;  Location: [facility] CATH LAB;  Service:  Cardiovascular;  Laterality: N/A;  . Venia Minks DILATION N/A 02/26/2014   Procedure: Venia Minks DILATION;  Surgeon: Daneil Dolin, MD;  Location: [facility] ENDO SUITE;  Service: Endoscopy;  Laterality: N/A;  . PERIPHERAL ARTERIAL STENT GRAFT  04/04/2012  . RENAL ANGIOGRAM Left 04/22/2014   Procedure: RENAL ANGIOGRAM;  Surgeon: Lorretta Harp, MD;  Location: [facility] CATH LAB;  Service: Cardiovascular;  Laterality: Left;  . TUBAL LIGATION  1990  . VIDEO BRONCHOSCOPY WITH ENDOBRONCHIAL NAVIGATION N/A 04/28/2020   Procedure: VIDEO BRONCHOSCOPY WITH ENDOBRONCHIAL NAVIGATION;  Surgeon: [name], MD;  Location: [facility] OR;  Service: Thoracic;  Laterality: N/A;    Allergies  Allergies  Allergen Reactions  . Latex Rash    Blisters  . Codeine Nausea And Vomiting    History of Present Illness    Ms. Armistead has a PMH of PAD, hypertension, renal artery stenosis, COPD, acute appendicitis, GERD, tobacco abuse, HLD, claudication, dysphagia, right lower quadrant abdominal pain, palpitations, and lung nodule.  She was last seen by [name] on 03/19/2020.  She was initially referred by [name] for peripheral vascular evaluation.  She was noted to have risk factors of 30-pack-year smoking history, she was reluctant to risk modification.  She indicated she had a strong family history of heart disease.  She had a negative nuclear stress test by [name] at [facility].  She complained of claudication.  Her Dopplers suggested mild diminished left ABI performed at anytime hospital and confirmed in our lab suggesting high-grade left common iliac artery stenosis.  [name] performed an angiogram 9/13 and confirmed this with a 40 mm pullback gradient.  She  received a left common iliac artery stent which improved her left ABI from 0.8-2 0.96.  She also noted improvement in her claudication symptoms.  Her renal Dopplers have been followed closely by [place] to watch for progression of disease.  She had an increase in her  serum creatinine from 1.2-2.0.  Her PCP discontinued her ACE inhibitor.  [name] performed angiography on her 10/15 which showed 60% left renal artery stenosis with a 20 mmHg gradient.  Her left iliac artery stent was widely patent.  Medical management was recommended.  During her follow-up visit 03/19/2020 she continued to smoke.  She reported progressive claudication in her left greater than her right.  She was noted to have labile hypertension.  She was scheduled for renal and lower extremity arterial Dopplers in the next month.  Her lower extremity arterial duplex 05/05/2020 showed widely patent left CFA, SFA, popliteal and tibial trunk without focal stenosis.  She was seen and evaluated by [name].  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  Surgery is scheduled for 06/16/2020.  She presents to the clinic today for follow-up evaluation states she continues to have left lower extremity claudication.  She states that she is able to walk fairly well on flat ground however when she walks upstairs or a slight incline she notices claudication.  We reviewed her ABI results and recommendations.  She expressed understanding.  She is still smoking around 1 pack/day.  She denies increased work of breathing and accelerated heart rates.  I will give her smoking cessation information, have her continue to be as physically active as she can be, and plan follow-up for 3 months.  Today she denies chest pain, shortness of breath, lower extremity edema, fatigue, palpitations, melena, hematuria, hemoptysis, diaphoresis, weakness, presyncope, syncope, orthopnea, and PND.   Home Medications    Prior to Admission medications   Medication Sig Start Date End Date Taking? Authorizing Provider  amLODipine (NORVASC) 5 MG tablet Take 1 tablet (5 mg total) by mouth daily. 03/19/20 06/17/20  Lorretta Harp, MD   aspirin EC 81 MG tablet Take 81 mg by mouth daily.    [provider]  Budeson-Glycopyrrol-Formoterol (BREZTRI AEROSPHERE) 160-9-4.8 MCG/ACT AERO Inhale 2 puffs into the lungs in the morning and at bedtime. Take 2 puffs first thing in am and then another 2 puffs about 12 hours later. Patient taking differently: Inhale 2 puffs into the lungs in the morning and at bedtime.  02/29/20   Tanda Rockers, MD  cholecalciferol (VITAMIN D3) 25 MCG (1000 UNIT) tablet Take 1,000 Units by mouth daily.    [provider]  clopidogrel (PLAVIX) 75 MG tablet Take 1 tablet (75 mg total) by mouth daily. PLEASE SCHEDULE APPOINTMENT FOR FURTHER REFILLS. 2ND ATTEMPT 06/20/19   Lorretta Harp, MD  cyclobenzaprine (FLEXERIL) 10 MG tablet Take 10 mg by mouth 3 (three) times daily as needed for muscle spasms.    [provider]  DEXILANT 60 MG capsule TAKE 1 CAPSULE ONCE DAILY Patient taking differently: Take 60 mg by mouth daily.  01/25/20   Lockamy, Randi L, NP-C  escitalopram (LEXAPRO) 10 MG tablet Take 10 mg by mouth Daily. 02/21/12   [provider]  fenofibrate 160 MG tablet Take 160 mg by mouth daily.  05/25/18   [provider]  folic acid (FOLVITE) 1 MG tablet Take 1 tablet (1 mg total) by mouth daily.  12/14/17   Derek Jack, MD  levofloxacin (LEVAQUIN) 500 MG tablet Take 1 tablet (500 mg total) by mouth daily. 05/12/20   Derek Jack, MD  metoprolol tartrate (LOPRESSOR) 25 MG tablet Take 1 tablet (25 mg total) by mouth 2 (two) times daily as needed (afib). 04/28/20   [name], MD  Red Yeast Rice 600 MG CAPS Take 600 mg by mouth 2 (two) times daily.     [provider]  rosuvastatin (CRESTOR) 20 MG tablet Take 20 mg by mouth daily.  06/19/18   [provider]  vitamin B-12 (CYANOCOBALAMIN) 1000 MCG tablet Take 1 tablet (1,000 mcg total) by mouth daily. 04/22/17   Twana First, MD    Family History    Family History  Problem  Relation Age of Onset  . Leukemia Father   . Cancer Father   . Heart disease Father   . Heart disease Mother   . Heart disease Other   . Arthritis Other   . Asthma Other   . Colon cancer Other        unknown, possibly father   She indicated that her mother is alive. She indicated that her father is deceased.  Social History    Social History   Socioeconomic History  . Marital status: Married    Spouse name: Not on file  . Number of children: Not on file  . Years of education: Not on file  . Highest education level: Not on file  Occupational History  . Occupation: Chiropodist: [employer]    Comment: [address]  Tobacco Use  . Smoking status: Current Every Day Smoker    Packs/day: 1.00    Years: 44.00    Pack years: 44.00    Types: Cigarettes  . Smokeless tobacco: Never Used  Vaping Use  . Vaping Use: Never used  Substance and Sexual Activity  . Alcohol use: No  . Drug use: No  . Sexual activity: Never  Other Topics Concern  . Not on file  Social History Narrative  . Not on file   Social Determinants of Health   Financial Resource Strain:   . Difficulty of Paying Living Expenses: Not on file  Food Insecurity:   . Worried About Charity fundraiser in the Last Year: Not on file  . Ran Out of Food in the Last Year: Not on file  Transportation Needs:   . Lack of Transportation (Medical): Not on file  . Lack of Transportation (Non-Medical): Not on file  Physical Activity:   . Days of Exercise per Week: Not on file  . Minutes of Exercise per Session: Not on file  Stress:   . Feeling of Stress : Not on file  Social Connections:   . Frequency of Communication with Friends and Family: Not on file  . Frequency of Social Gatherings with Friends and Family: Not on file  . Attends Religious Services: Not on file  . Active Member of Clubs or Organizations: Not on file  . Attends Archivist Meetings: Not on file  . Marital Status: Not  on file  Intimate Partner Violence:   . Fear of Current or Ex-Partner: Not on file  . Emotionally Abused: Not on file  . Physically Abused: Not on file  . Sexually Abused: Not on file     Review of Systems    General:  No chills, fever, night sweats or weight changes.  Cardiovascular:  No  chest pain, dyspnea on exertion, edema, orthopnea, palpitations, paroxysmal nocturnal dyspnea. Dermatological: No rash, lesions/masses Respiratory: No cough, dyspnea Urologic: No hematuria, dysuria Abdominal:   No nausea, vomiting, diarrhea, bright red blood per rectum, melena, or hematemesis Neurologic:  No visual changes, wkns, changes in mental status. All other systems reviewed and are otherwise negative except as noted above.  Physical Exam    VS:  BP 122/68 (BP Location: Right Arm, Patient Position: Sitting)   Pulse 70   Ht 5\' 4"  (1.626 m)   Wt 145 lb 3.2 oz (65.9 kg)   SpO2 93%   BMI 24.92 kg/m  , BMI Body mass index is 24.92 kg/m. GEN: Well nourished, well developed, in no acute distress. HEENT: normal. Neck: Supple, no JVD, carotid bruits, or masses. Cardiac: RRR, no murmurs, rubs, or gallops. No clubbing, cyanosis, edema.  Radials/DP/PT 2+ and equal bilaterally.  Respiratory:  Respirations regular and unlabored, clear to auscultation bilaterally. GI: Soft, nontender, nondistended, BS + x 4. MS: no deformity or atrophy. Skin: warm and dry, no rash. Neuro:  Strength and sensation are intact. Psych: Normal affect.  Accessory Clinical Findings    Recent Labs: 04/25/2020: ALT 13; BUN 8; Creatinine, Ser 0.89; Hemoglobin 14.6; Platelets 312; Potassium 3.1; Sodium 139   Recent Lipid Panel No results found for: CHOL, TRIG, HDL, CHOLHDL, VLDL, LDLCALC, LDLDIRECT  ECG personally reviewed by me today-none today.   Lower extremity arterial duplex 05/05/2020 Widely patent left CFA, SFA, popliteal and tibial peroneal trunk, without  focal stenosis.       Summary:  Right: No  infrainguinal focal stenosis.   Left: No infrainguinal focal stenosis.     See table(s) above for measurements and observations.  Assessment & Plan   1.  Peripheral arterial disease-has continued lower extremity claudication.  LAE's showed No infrainguinal focal stenosis bilaterally.  Reviewed with patient and daughter. Continue aspirin, Plavix, fenofibrate, rosuvastatin Heart healthy low-sodium high-fiber diet-salty 6 given Increase physical activity as tolerated Repeat LAE's when clinically indicated  Essential hypertension-BP today 122/68.  Well-controlled at home. Continue metoprolol, amlodipine Heart healthy low-sodium diet-salty 6 given Increase physical activity as tolerated  Hyperlipidemia-LDL 59 on 12/26/2018. Continue fenofibrate, rosuvastatin Heart healthy low-sodium high-fiber diet-salty 6 given Increase physical activity as tolerated  Renal artery stenosis-renal duplex 04/23/2019 showed a left RAR 4.6 with pole-to-pole dimension of 8.9 cm   Right upper lobe adenocarcinoma-plans to undergo VATS procedure with wedge resection of middle lobe and have her right upper lobe segmentectomy 06/16/2020 with [name].  Tobacco abuse-continues to smoke 1 pack/day.  Counseled on quitting. Smoking cessation information given  Disposition: Follow-up with [name] or me in 3 months.   Jossie Ng. Ayla Dunigan NP-C    06/11/2020, 10:29 AM [address] [place] [address] Office [phone] Fax [phone]  Notice: This dictation was prepared with Dragon dictation along with smaller phrase technology. Any transcriptional errors that result from this process are unintentional and may not be corrected upon review.

## 2020-06-09 ENCOUNTER — Other Ambulatory Visit: Payer: Self-pay

## 2020-06-09 ENCOUNTER — Inpatient Hospital Stay (HOSPITAL_BASED_OUTPATIENT_CLINIC_OR_DEPARTMENT_OTHER): Payer: Commercial Managed Care - PPO | Admitting: Hematology

## 2020-06-09 VITALS — BP 147/86 | HR 67 | Temp 97.7°F | Resp 20 | Wt 146.5 lb

## 2020-06-09 DIAGNOSIS — C3491 Malignant neoplasm of unspecified part of right bronchus or lung: Secondary | ICD-10-CM

## 2020-06-09 DIAGNOSIS — E538 Deficiency of other specified B group vitamins: Secondary | ICD-10-CM

## 2020-06-09 DIAGNOSIS — E785 Hyperlipidemia, unspecified: Secondary | ICD-10-CM | POA: Diagnosis not present

## 2020-06-09 DIAGNOSIS — I10 Essential (primary) hypertension: Secondary | ICD-10-CM | POA: Diagnosis not present

## 2020-06-09 DIAGNOSIS — R5383 Other fatigue: Secondary | ICD-10-CM | POA: Diagnosis not present

## 2020-06-09 DIAGNOSIS — C3411 Malignant neoplasm of upper lobe, right bronchus or lung: Secondary | ICD-10-CM | POA: Diagnosis present

## 2020-06-09 DIAGNOSIS — E611 Iron deficiency: Secondary | ICD-10-CM

## 2020-06-09 DIAGNOSIS — M1990 Unspecified osteoarthritis, unspecified site: Secondary | ICD-10-CM | POA: Diagnosis not present

## 2020-06-09 DIAGNOSIS — Z79899 Other long term (current) drug therapy: Secondary | ICD-10-CM | POA: Diagnosis not present

## 2020-06-09 DIAGNOSIS — Z23 Encounter for immunization: Secondary | ICD-10-CM

## 2020-06-09 DIAGNOSIS — J449 Chronic obstructive pulmonary disease, unspecified: Secondary | ICD-10-CM | POA: Diagnosis not present

## 2020-06-09 DIAGNOSIS — I739 Peripheral vascular disease, unspecified: Secondary | ICD-10-CM | POA: Diagnosis not present

## 2020-06-09 DIAGNOSIS — D509 Iron deficiency anemia, unspecified: Secondary | ICD-10-CM | POA: Diagnosis not present

## 2020-06-09 DIAGNOSIS — F17210 Nicotine dependence, cigarettes, uncomplicated: Secondary | ICD-10-CM | POA: Diagnosis not present

## 2020-06-09 DIAGNOSIS — K219 Gastro-esophageal reflux disease without esophagitis: Secondary | ICD-10-CM | POA: Diagnosis not present

## 2020-06-09 MED ORDER — INFLUENZA VAC SPLIT QUAD 0.5 ML IM SUSY: 0.5000 mL | PREFILLED_SYRINGE | Freq: Once | INTRAMUSCULAR | Status: DC

## 2020-06-09 NOTE — Patient Instructions (Signed)
[place] at [facility] Discharge Instructions  You were seen today by Dr. Delton Coombes. He went over your recent results and scans. You may proceed with your surgery on 11/29. Dr. Delton Coombes will see you back in 7 weeks for follow up.   Thank you for choosing [place] at [facility] to provide your oncology and hematology care.  To afford each patient quality time with our provider, please arrive at least 15 minutes before your scheduled appointment time.   If you have a lab appointment with the [place] please come in thru the Main Entrance and check in at the main information desk  You need to re-schedule your appointment should you arrive 10 or more minutes late.  We strive to give you quality time with our providers, and arriving late affects you and other patients whose appointments are after yours.  Also, if you no show three or more times for appointments you may be dismissed from the clinic at the providers discretion.     Again, thank you for choosing [facility].  Our hope is that these requests will decrease the amount of time that you wait before being seen by our physicians.       _____________________________________________________________  Should you have questions after your visit to [facility], please contact our office at [phone] between the hours of 8:00 a.m. and 4:30 p.m.  Voicemails left after 4:00 p.m. will not be returned until the following business day.  For prescription refill requests, have your pharmacy contact our office and allow 72 hours.    Cancer Center Support Programs:   > Cancer Support Group  2nd Tuesday of the month 1pm-2pm, Journey Room

## 2020-06-09 NOTE — Progress Notes (Signed)
Connie Farrell, [place] [address]   CLINIC:  Medical Oncology/Hematology  PCP:  Celene Squibb, MD [address] Liana Crocker [place] [address] [phone]   REASON FOR VISIT:  Follow-up for right lung adenocarcinoma, IDA and vitamin B12 deficiency  PRIOR THERAPY: Vitamin B12 monthly injections through 05/22/2019  NGS Results: Not done  CURRENT THERAPY: Intermittent Feraheme; vitamin B12 daily  BRIEF ONCOLOGIC HISTORY:  Oncology History   No history exists.    CANCER STAGING: Cancer Staging No matching staging information was found for the patient.  INTERVAL HISTORY:  Connie Farrell, a 59 y.o. female, returns for routine follow-up of her right lung adenocarcinoma, IDA and vitamin B12 deficiency. Connie Farrell was last seen on 05/12/2020.   Today she is accompanied by her daughter and she reports feeling okay. She denies having any recent infections and her cough is stable. She was doing salt water mouth rinses after brushing her teeth and reports that her mouth soreness improved.  She is scheduled for a robotic-assisted right middle lobe wedge resection and right lower lobe segmentectomy with [name] on 11/29.   REVIEW OF SYSTEMS:  Review of Systems  Constitutional: Positive for appetite change (50%) and fatigue (50%).  Respiratory: Positive for cough and shortness of breath.   Gastrointestinal: Positive for diarrhea.  Musculoskeletal: Positive for arthralgias (7/10 L leg pain).  Neurological: Positive for numbness (L leg).  Psychiatric/Behavioral: Positive for sleep disturbance. The patient is nervous/anxious.   All other systems reviewed and are negative.   PAST MEDICAL/SURGICAL HISTORY:  Past Medical History:  Diagnosis Date  . Anxiety   . Arthritis    "hands; spine too" (04/04/2012)  . COPD (chronic obstructive pulmonary disease) ([facility])   . Exertional dyspnea   . Folate deficiency 10/17/2015  . GERD (gastroesophageal reflux  disease)   . Hyperlipemia   . Hypertension   . Macrocytosis 10/17/2015  . Neck pain, chronic   . PAD (peripheral artery disease) ([facility])   . Renal artery stenosis ([place])   . Seasonal allergies   . Tobacco abuse    Past Surgical History:  Procedure Laterality Date  . ABDOMINAL ANGIOGRAM  04/04/2012   Procedure: ABDOMINAL ANGIOGRAM;  Surgeon: Lorretta Harp, MD;  Location: [facility] CATH LAB;  Service: Cardiovascular;;  . ANTERIOR CERVICAL DECOMP/DISCECTOMY FUSION  ~ 04/2006   C6, 7, T1  . COLONOSCOPY N/A 02/26/2014   Adequate preparation. Minimal anal canal hemorrhoids;otherwise, normal rectumdiminutive polyps in the middescending segment; otherwise, the remainder of colonic mucosaappeared normal. Hyperplastic polyps.  . ESOPHAGOGASTRODUODENOSCOPY N/A 02/26/2014   GNF:[id number] distal esophagus, query short segment Barrett's. Status post Venia Minks dilation with esophageal biopsy. Abnormal gastric mucosa. Esophageal biopsy negative for Barrett's. Gastric biopsy showed mild chronic gastritis, no H pylori.  Marland Kitchen LAPAROSCOPIC APPENDECTOMY N/A 09/10/2013   Procedure: APPENDECTOMY LAPAROSCOPIC;  Surgeon: Jamesetta So, MD;  Location: [facility] ORS;  Service: General;  Laterality: N/A;  . LOWER EXTREMITY ANGIOGRAM N/A 04/04/2012   Procedure: LOWER EXTREMITY ANGIOGRAM;  Surgeon: Lorretta Harp, MD;  Location: [facility] CATH LAB;  Service: Cardiovascular;  Laterality: N/A;  . Venia Minks DILATION N/A 02/26/2014   Procedure: Venia Minks DILATION;  Surgeon: Daneil Dolin, MD;  Location: [facility] ENDO SUITE;  Service: Endoscopy;  Laterality: N/A;  . PERIPHERAL ARTERIAL STENT GRAFT  04/04/2012  . RENAL ANGIOGRAM Left 04/22/2014   Procedure: RENAL ANGIOGRAM;  Surgeon: Lorretta Harp, MD;  Location: [facility] CATH LAB;  Service: Cardiovascular;  Laterality: Left;  .  TUBAL LIGATION  1990  . VIDEO BRONCHOSCOPY WITH ENDOBRONCHIAL NAVIGATION N/A 04/28/2020   Procedure: VIDEO BRONCHOSCOPY WITH ENDOBRONCHIAL NAVIGATION;  Surgeon: [name], MD;   Location: [facility];  Service: Thoracic;  Laterality: N/A;    SOCIAL HISTORY:  Social History   Socioeconomic History  . Marital status: Married    Spouse name: Not on file  . Number of children: Not on file  . Years of education: Not on file  . Highest education level: Not on file  Occupational History  . Occupation: Chiropodist: [employer]    Comment: [address]  Tobacco Use  . Smoking status: Current Every Day Smoker    Packs/day: 1.00    Years: 44.00    Pack years: 44.00    Types: Cigarettes  . Smokeless tobacco: Never Used  Vaping Use  . Vaping Use: Never used  Substance and Sexual Activity  . Alcohol use: No  . Drug use: No  . Sexual activity: Never  Other Topics Concern  . Not on file  Social History Narrative  . Not on file   Social Determinants of Health   Financial Resource Strain:   . Difficulty of Paying Living Expenses: Not on file  Food Insecurity:   . Worried About Charity fundraiser in the Last Year: Not on file  . Ran Out of Food in the Last Year: Not on file  Transportation Needs:   . Lack of Transportation (Medical): Not on file  . Lack of Transportation (Non-Medical): Not on file  Physical Activity:   . Days of Exercise per Week: Not on file  . Minutes of Exercise per Session: Not on file  Stress:   . Feeling of Stress : Not on file  Social Connections:   . Frequency of Communication with Friends and Family: Not on file  . Frequency of Social Gatherings with Friends and Family: Not on file  . Attends Religious Services: Not on file  . Active Member of Clubs or Organizations: Not on file  . Attends Archivist Meetings: Not on file  . Marital Status: Not on file  Intimate Partner Violence:   . Fear of Current or Ex-Partner: Not on file  . Emotionally Abused: Not on file  . Physically Abused: Not on file  . Sexually Abused: Not on file    FAMILY HISTORY:  Family History  Problem Relation Age of Onset  .  Leukemia Father   . Cancer Father   . Heart disease Father   . Heart disease Mother   . Heart disease Other   . Arthritis Other   . Asthma Other   . Colon cancer Other        unknown, possibly father    CURRENT MEDICATIONS:  Current Outpatient Medications  Medication Sig Dispense Refill  . acetaminophen (TYLENOL) 500 MG tablet Take 500 mg by mouth every 6 (six) hours as needed for moderate pain.    Marland Kitchen albuterol (VENTOLIN HFA) 108 (90 Base) MCG/ACT inhaler Inhale 1-2 puffs into the lungs every 6 (six) hours as needed for wheezing or shortness of breath.    Marland Kitchen amLODipine (NORVASC) 5 MG tablet Take 1 tablet (5 mg total) by mouth daily. 180 tablet 3  . aspirin EC 81 MG tablet Take 81 mg by mouth daily.    . Budeson-Glycopyrrol-Formoterol (BREZTRI AEROSPHERE) 160-9-4.8 MCG/ACT AERO Inhale 2 puffs into the lungs in the morning and at bedtime. Take 2 puffs first  thing in am and then another 2 puffs about 12 hours later. (Patient taking differently: Inhale 2 puffs into the lungs in the morning and at bedtime. ) 10.7 g 11  . cholecalciferol (VITAMIN D3) 25 MCG (1000 UNIT) tablet Take 1,000 Units by mouth daily.    . clopidogrel (PLAVIX) 75 MG tablet Take 1 tablet (75 mg total) by mouth daily. PLEASE SCHEDULE APPOINTMENT FOR FURTHER REFILLS. 2ND ATTEMPT (Patient taking differently: Take 75 mg by mouth daily. ) 15 tablet 0  . cyclobenzaprine (FLEXERIL) 10 MG tablet Take 10 mg by mouth 3 (three) times daily as needed for muscle spasms.    . DEXILANT 60 MG capsule TAKE 1 CAPSULE ONCE DAILY (Patient taking differently: Take 60 mg by mouth daily. ) 90 capsule 1  . escitalopram (LEXAPRO) 10 MG tablet Take 10 mg by mouth daily.     . fenofibrate 160 MG tablet Take 160 mg by mouth daily.     . folic acid (FOLVITE) 1 MG tablet Take 1 tablet (1 mg total) by mouth daily. 90 tablet 4  . levofloxacin (LEVAQUIN) 500 MG tablet Take 1 tablet (500 mg total) by mouth daily. 10 tablet 0  . metoprolol tartrate  (LOPRESSOR) 25 MG tablet Take 1 tablet (25 mg total) by mouth 2 (two) times daily as needed (afib). 60 tablet 0  . Red Yeast Rice 600 MG CAPS Take 600 mg by mouth daily.     . rosuvastatin (CRESTOR) 20 MG tablet Take 20 mg by mouth daily.     . vitamin B-12 (CYANOCOBALAMIN) 1000 MCG tablet Take 1 tablet (1,000 mcg total) by mouth daily. 60 tablet 2   Current Facility-Administered Medications  Medication Dose Route Frequency Provider Last Rate Last Admin  . influenza vac split quadrivalent PF (FLUARIX) injection 0.5 mL  0.5 mL Intramuscular Once Derek Jack, MD        ALLERGIES:  Allergies  Allergen Reactions  . Latex Rash    Blisters  . Codeine Nausea And Vomiting    PHYSICAL EXAM:  Performance status (ECOG): 1 - Symptomatic but completely ambulatory  Vitals:   06/09/20 1313  BP: (!) 147/86  Pulse: 67  Resp: 20  Temp: 97.7 F (36.5 C)  SpO2: 99%   Wt Readings from Last 3 Encounters:  06/09/20 146 lb 8 oz (66.5 kg)  06/03/20 145 lb (65.8 kg)  05/12/20 143 lb 4.8 oz (65 kg)   Physical Exam Vitals reviewed.  Constitutional:      Appearance: Normal appearance.  HENT:     Mouth/Throat:     Lips: No lesions.     Mouth: No oral lesions.     Dentition: No gum lesions.     Tongue: No lesions.  Cardiovascular:     Rate and Rhythm: Normal rate and regular rhythm.     Pulses: Normal pulses.     Heart sounds: Normal heart sounds.  Pulmonary:     Effort: Pulmonary effort is normal.     Breath sounds: Normal breath sounds.  Neurological:     General: No focal deficit present.     Mental Status: She is alert and oriented to person, place, and time.  Psychiatric:        Mood and Affect: Mood normal.        Behavior: Behavior normal.      LABORATORY DATA:  I have reviewed the labs as listed.  CBC Latest Ref Rng & Units 04/25/2020 04/03/2020 10/01/2019  WBC 4.0 - 10.5 K/uL 7.7  6.2 4.9  Hemoglobin 12.0 - 15.0 g/dL 14.6 14.2 15.9(H)  Hematocrit 36 - 46 % 45.8 45.3  49.3(H)  Platelets 150 - 400 K/uL 312 247 165   CMP Latest Ref Rng & Units 04/25/2020 04/03/2020 10/01/2019  Glucose 70 - 99 mg/dL 93 79 65(L)  BUN 6 - 20 mg/dL 8 12 14   Creatinine 0.44 - 1.00 mg/dL 0.89 0.96 0.99  Sodium 135 - 145 mmol/L 139 136 136  Potassium 3.5 - 5.1 mmol/L 3.1(L) 4.3 4.3  Chloride 98 - 111 mmol/L 101 102 106  CO2 22 - 32 mmol/L 26 25 21(L)  Calcium 8.9 - 10.3 mg/dL 9.9 9.4 9.3  Total Protein 6.5 - 8.1 g/dL 7.6 6.9 7.5  Total Bilirubin 0.3 - 1.2 mg/dL 0.3 0.6 0.5  Alkaline Phos 38 - 126 U/L 103 73 89  AST 15 - 41 U/L 17 12(L) 19  ALT 0 - 44 U/L 13 11 22     DIAGNOSTIC IMAGING:  I have independently reviewed the scans and discussed with the patient. CT Chest Wo Contrast  Result Date: 06/03/2020 CLINICAL DATA:  Follow-up lung nodule. New diagnosis of lung cancer. EXAM: CT CHEST WITHOUT CONTRAST TECHNIQUE: Multidetector CT imaging of the chest was performed following the standard protocol without IV contrast. COMPARISON:  PET-CT 04/15/2019 FINDINGS: Cardiovascular: The heart size appears within normal limits. There is no pericardial effusion. Mild aortic atherosclerosis. Coronary artery atherosclerotic calcifications. Mediastinum/Nodes: Normal appearance of the thyroid gland. The trachea appears patent and is midline. Normal appearance of the esophagus. No enlarged axillary, supraclavicular, or mediastinal adenopathy. Hilar lymph nodes are suboptimally evaluated due to lack of IV contrast material. Lungs/Pleura: As mentioned previously there is heterogeneous diffuse ground-glass and ill-defined centrilobular micro nodularity. No pleural effusion identified. Subpleural consolidation within the anterior left lower lobe measures 3.1 x 1.5 cm, image 87/8. Previously this measured 3.7 x 2.6 cm. Multiple lung nodules are again identified: -the solid nodule within the periphery of the right upper lobe measures 1.1 cm, image 63/8. Previously 1 cm. -Right middle lobe lung nodule measures  0.9 cm, image 79/8. Unchanged. -3 mm right lower lobe lung nodule is noted, image 81/8.  Unchanged. -Within the right upper lobe there is a 4 mm sub solid nodule, image 55/8. Also stable. -Small solid nodule within the posterior right upper lobe measures 4 mm, image 32/8. Stable. No new or enlarging lung nodules Upper Abdomen: No acute abnormality. Musculoskeletal: Status post ACDF. Multi level spondylosis noted within the thoracic spine. No acute or suspicious osseous findings. IMPRESSION: 1. No significant change in size of right lung nodules. No new suspicious lung nodules identified. 2. Decrease in size of subpleural consolidation within the anterior left lower lobe likely reflecting resolving infection. 3. Similar appearance of diffuse ground-glass and ill-defined centrilobular micro nodularity. Findings are nonspecific and may be seen with respiratory bronchiolitis interstitial lung disease (RB-ILD). 4. Coronary artery atherosclerotic calcifications. 5. Aortic atherosclerosis. Aortic Atherosclerosis (ICD10-I70.0). Electronically Signed   By: Kerby Moors M.D.   On: 06/03/2020 10:14     ASSESSMENT:  1. Right upper lobe adenocarcinoma: -PET scan on April 14, 2020 shows right upper lobe pulmonary nodule with SUV 5.6.  Dense masslike consolidative opacity in the anterior left lower lobe with SUV 11.7.  Hypermetabolic nodule seen just medial to this dominant lesion is hypermetabolic but somewhat obscured by breathing motion.  7 mm short axis paraesophageal lymph node with SUV 4.7.  Focal hypermetabolism in the left hilum with SUV 4.0.  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 -Navigational bronchoscopy by [name] showed non-small cell carcinoma, adenocarcinoma of the right upper lobe nodule.  Right middle lobe nodule nondiagnostic.  Left lower lobe infiltrate/question mass nondiagnostic.  2.  Iron deficiency state: - On intermittent parenteral iron therapy since 2015. -Last Feraheme on December 28, 2018. -Latest CBC shows hemoglobin 14.6.  Last ferritin 101 on April 03, 2020.  Percent saturation is 13.  3. B12 deficiency: -She received B12 injections on May 22, 2019.   PLAN:  1. Right upper lobe adenocarcinoma: -She had CT scan after completion of antibiotics. -We reviewed CT chest without contrast from 06/03/2020.  Heterogeneous diffuse groundglass nodularity, subpleural consolidation within the anterior left lower lobe measures 3.1 x 1.5 cm.  Previously it measured 3.7 x 2.6 cm.  No significant change in the size of the right lung nodules. -She was already evaluated by [name] and is being planned for robotic right VATS for right middle lobe wedge resection and right upper lobe segmentectomy on 06/16/2020. -I will see her back in 4 to 6 weeks after surgery to discuss results and further plan. -I agree with repeating CT scan in 3 months for the follow-up of left lower lobe consolidation.     Orders placed this encounter:  No orders of the defined types were placed in this encounter.    Derek Jack, MD [place] [phone]   I, Milinda Antis, am acting as a scribe for Dr. Sanda Linger.  I, Derek Jack MD, have reviewed the above documentation for accuracy and completeness, and I agree with the above.

## 2020-06-11 ENCOUNTER — Encounter: Payer: Self-pay | Admitting: General Practice

## 2020-06-11 ENCOUNTER — Other Ambulatory Visit: Payer: Self-pay

## 2020-06-11 ENCOUNTER — Ambulatory Visit (INDEPENDENT_AMBULATORY_CARE_PROVIDER_SITE_OTHER): Payer: Commercial Managed Care - PPO | Admitting: General Practice

## 2020-06-11 VITALS — BP 122/68 | HR 70 | Ht 64.0 in | Wt 145.2 lb

## 2020-06-11 DIAGNOSIS — I10 Essential (primary) hypertension: Secondary | ICD-10-CM

## 2020-06-11 DIAGNOSIS — E782 Mixed hyperlipidemia: Secondary | ICD-10-CM | POA: Diagnosis not present

## 2020-06-11 DIAGNOSIS — C3411 Malignant neoplasm of upper lobe, right bronchus or lung: Secondary | ICD-10-CM

## 2020-06-11 DIAGNOSIS — Z720 Tobacco use: Secondary | ICD-10-CM

## 2020-06-11 DIAGNOSIS — I701 Atherosclerosis of renal artery: Secondary | ICD-10-CM

## 2020-06-11 DIAGNOSIS — I739 Peripheral vascular disease, unspecified: Secondary | ICD-10-CM | POA: Diagnosis not present

## 2020-06-11 LAB — ACID FAST CULTURE WITH REFLEXED SENSITIVITIES (MYCOBACTERIA): Acid Fast Culture: NEGATIVE

## 2020-06-11 NOTE — Patient Instructions (Signed)
Medication Instructions:  The current medical regimen is effective;  continue present plan and medications as directed. Please refer to the Current Medication list given to you today. *If you need a refill on your cardiac medications before your next appointment, please call your pharmacy*  Lab Work:   Testing/Procedures:  NONE    NONE  Special Instructions PLEASE READ AND FOLLOW SALTY 6-ATTACHED-1,800mg  daily  PLEASE READ AND FOLLOW SMOKING CESSATION TIPS-ATTACHED  Follow-Up: Your next appointment:  3 month(s) In Person with Quay Burow, MD OR IF UNAVAILABLE [place], FNP-C  At [facility], you and your health needs are our priority.  As part of our continuing mission to provide you with exceptional heart care, we have created designated Provider Care Teams.  These Care Teams include your primary Cardiologist (physician) and Advanced Practice Providers (APPs -  Physician Assistants and Nurse Practitioners) who all work together to provide you with the care you need, when you need it.  We recommend signing up for the patient portal called "MyChart".  Sign up information is provided on this After Visit Summary.  MyChart is used to connect with patients for Virtual Visits (Telemedicine).  Patients are able to view lab/test results, encounter notes, upcoming appointments, etc.  Non-urgent messages can be sent to your provider as well.   To learn more about what you can do with MyChart, go to [email or web address].              6 SALTY THINGS TO AVOID     1,800MG  DAILY     Steps to Quit Smoking Smoking tobacco is the leading cause of preventable death. It can affect almost every organ in the body. Smoking puts you and people around you at risk for many serious, long-lasting (chronic) diseases. Quitting smoking can be hard, but it is one of the best things that you can do for your health. It is never too late to quit. How do I get ready to quit? When you decide to quit smoking,  make a plan to help you succeed. Before you quit:  Pick a date to quit. Set a date within the next 2 weeks to give you time to prepare.  Write down the reasons why you are quitting. Keep this list in places where you will see it often.  Tell your family, friends, and co-workers that you are quitting. Their support is important.  Talk with your doctor about the choices that may help you quit.  Find out if your health insurance will pay for these treatments.  Know the people, places, things, and activities that make you want to smoke (triggers). Avoid them. What first steps can I take to quit smoking?  Throw away all cigarettes at home, at work, and in your car.  Throw away the things that you use when you smoke, such as ashtrays and lighters.  Clean your car. Make sure to empty the ashtray.  Clean your home, including curtains and carpets. What can I do to help me quit smoking? Talk with your doctor about taking medicines and seeing a counselor at the same time. You are more likely to succeed when you do both.  If you are pregnant or breastfeeding, talk with your doctor about counseling or other ways to quit smoking. Do not take medicine to help you quit smoking unless your doctor tells you to do so. To quit smoking: Quit right away  Quit smoking totally, instead of slowly cutting back on how much you smoke over a period  of time.  Go to counseling. You are more likely to quit if you go to counseling sessions regularly. Take medicine You may take medicines to help you quit. Some medicines need a prescription, and some you can buy over-the-counter. Some medicines may contain a drug called nicotine to replace the nicotine in cigarettes. Medicines may:  Help you to stop having the desire to smoke (cravings).  Help to stop the problems that come when you stop smoking (withdrawal symptoms). Your doctor may ask you to use:  Nicotine patches, gum, or lozenges.  Nicotine inhalers or  sprays.  Non-nicotine medicine that is taken by mouth. Find resources Find resources and other ways to help you quit smoking and remain smoke-free after you quit. These resources are most helpful when you use them often. They include:  Online chats with a Social worker.  Phone quitlines.  Printed Furniture conservator/restorer.  Support groups or group counseling.  Text messaging programs.  Mobile phone apps. Use apps on your mobile phone or tablet that can help you stick to your quit plan. There are many free apps for mobile phones and tablets as well as websites. Examples include Quit Guide from the [employer] and smokefree.gov  What things can I do to make it easier to quit?   Talk to your family and friends. Ask them to support and encourage you.  Call a phone quitline (1-800-QUIT-NOW), reach out to support groups, or work with a Social worker.  Ask people who smoke to not smoke around you.  Avoid places that make you want to smoke, such as: ? Bars. ? Parties. ? Smoke-break areas at work.  Spend time with people who do not smoke.  Lower the stress in your life. Stress can make you want to smoke. Try these things to help your stress: ? Getting regular exercise. ? Doing deep-breathing exercises. ? Doing yoga. ? Meditating. ? Doing a body scan. To do this, close your eyes, focus on one area of your body at a time from head to toe. Notice which parts of your body are tense. Try to relax the muscles in those areas. How will I feel when I quit smoking? Day 1 to 3 weeks Within the first 24 hours, you may start to have some problems that come from quitting tobacco. These problems are very bad 2-3 days after you quit, but they do not often last for more than 2-3 weeks. You may get these symptoms:  Mood swings.  Feeling restless, nervous, angry, or annoyed.  Trouble concentrating.  Dizziness.  Strong desire for high-sugar foods and nicotine.  Weight gain.  Trouble pooping  (constipation).  Feeling like you may vomit (nausea).  Coughing or a sore throat.  Changes in how the medicines that you take for other issues work in your body.  Depression.  Trouble sleeping (insomnia). Week 3 and afterward After the first 2-3 weeks of quitting, you may start to notice more positive results, such as:  Better sense of smell and taste.  Less coughing and sore throat.  Slower heart rate.  Lower blood pressure.  Clearer skin.  Better breathing.  Fewer sick days. Quitting smoking can be hard. Do not give up if you fail the first time. Some people need to try a few times before they succeed. Do your best to stick to your quit plan, and talk with your doctor if you have any questions or concerns. Summary  Smoking tobacco is the leading cause of preventable death. Quitting smoking can be hard, but  it is one of the best things that you can do for your health.  When you decide to quit smoking, make a plan to help you succeed.  Quit smoking right away, not slowly over a period of time.  When you start quitting, seek help from your doctor, family, or friends. This information is not intended to replace advice given to you by your health care provider. Make sure you discuss any questions you have with your health care provider. Document Revised: 03/30/2019 Document Reviewed: 09/23/2018 Elsevier Patient Education  [place].

## 2020-06-11 NOTE — Pre-Procedure Instructions (Signed)
Your procedure is scheduled on Monday, November 29, from 11;00 AM- 4:13 PM.  Report to [facility] Main Entrance "A" at 09:00 A.M., and check in at the Admitting office.  Call this number if you have problems the morning of surgery:  [phone]    Remember:  Do not eat or drink after midnight the night before your surgery.    Take these medicines the morning of surgery with A SIP OF WATER:  amLODipine (NORVASC)  DEXILANT  escitalopram (LEXAPRO) Fenofibrate levofloxacin (LEVAQUIN) metoprolol tartrate (LOPRESSOR)  rosuvastatin (CRESTOR)  Budeson-Glycopyrrol-Formoterol (BREZTRI AEROSPHERE) inhaler  IF NEEDED: acetaminophen (TYLENOL)  cyclobenzaprine (FLEXERIL) albuterol (VENTOLIN HFA) inhaler (bring with you the day of surgery)   >>STOP clopidogrel (PLAVIX) 06/10/20 per Dr. Hendrickson<<   *Follow your surgeon's instructions on when to stop Aspirin.  If no instructions were given by your surgeon then you will need to call the office to get those instructions.      As of today, STOP taking any Aspirin (unless otherwise instructed by your surgeon) Aleve, Naproxen, Ibuprofen, Motrin, Advil, Goody's, BC's, all herbal medications, fish oil, and all vitamins.       The Morning of Surgery:               Do not wear jewelry, make up, or nail polish.            Do not wear lotions, powders, perfumes or deodorant.            Do not shave 48 hours prior to surgery.              Do not bring valuables to the hospital.            [facility] is not responsible for any belongings or valuables.  Do NOT Smoke (Tobacco/Vaping) or drink Alcohol 24 hours prior to your procedure.  If you use a CPAP at night, you may bring all equipment for your overnight stay.   Contacts, glasses, dentures or bridgework may not be worn into surgery.      For patients admitted to the hospital, discharge time will be determined by your treatment team.   Patients discharged the day of surgery will not  be allowed to drive home, and someone needs to stay with them for 24 hours.    Special instructions:   [facility]- Preparing For Surgery  Before surgery, you can play an important role. Because skin is not sterile, your skin needs to be as free of germs as possible. You can reduce the number of germs on your skin by washing with CHG (chlorahexidine gluconate) Soap before surgery.  CHG is an antiseptic cleaner which kills germs and bonds with the skin to continue killing germs even after washing.    Oral Hygiene is also important to reduce your risk of infection.  Remember - BRUSH YOUR TEETH THE MORNING OF SURGERY WITH YOUR REGULAR TOOTHPASTE  Please do not use if you have an allergy to CHG or antibacterial soaps. If your skin becomes reddened/irritated stop using the CHG.  Do not shave (including legs and underarms) for at least 48 hours prior to first CHG shower. It is OK to shave your face.  Please follow these instructions carefully.   1. Shower the NIGHT BEFORE SURGERY and the MORNING OF SURGERY with CHG Soap.   2. If you chose to wash your hair, wash your hair first as usual with your normal shampoo.  3. After you shampoo, rinse your hair  and body thoroughly to remove the shampoo.  4. Use CHG as you would any other liquid soap. You can apply CHG directly to the skin and wash gently with a scrungie or a clean washcloth.   5. Apply the CHG Soap to your body ONLY FROM THE NECK DOWN.  Do not use on open wounds or open sores. Avoid contact with your eyes, ears, mouth and genitals (private parts). Wash Face and genitals (private parts)  with your normal soap.   6. Wash thoroughly, paying special attention to the area where your surgery will be performed.  7. Thoroughly rinse your body with warm water from the neck down.  8. DO NOT shower/wash with your normal soap after using and rinsing off the CHG Soap.  9. Pat yourself dry with a CLEAN TOWEL.  10. Wear CLEAN PAJAMAS to bed the  night before surgery  11. Place CLEAN SHEETS on your bed the night of your first shower and DO NOT SLEEP WITH PETS.   Day of Surgery: SHOWER Wear Clean/Comfortable clothing the morning of surgery Do not apply any deodorants/lotions.   Remember to brush your teeth WITH YOUR REGULAR TOOTHPASTE.   Please read over the following fact sheets that you were given.

## 2020-06-13 ENCOUNTER — Other Ambulatory Visit (HOSPITAL_COMMUNITY)
Admission: RE | Admit: 2020-06-13 | Discharge: 2020-06-13 | Disposition: A | Discharge status: Home / Self Care | Payer: Commercial Managed Care - PPO | Source: Ambulatory Visit | Attending: Thoracic Surgery (Cardiothoracic Vascular Surgery) | Admitting: Thoracic Surgery (Cardiothoracic Vascular Surgery)

## 2020-06-13 ENCOUNTER — Encounter (HOSPITAL_COMMUNITY)
Admission: RE | Admit: 2020-06-13 | Discharge: 2020-06-13 | Disposition: A | Discharge status: Home / Self Care | Payer: Commercial Managed Care - PPO | Source: Ambulatory Visit | Attending: Thoracic Surgery (Cardiothoracic Vascular Surgery) | Admitting: Thoracic Surgery (Cardiothoracic Vascular Surgery)

## 2020-06-13 ENCOUNTER — Encounter (HOSPITAL_COMMUNITY): Payer: Self-pay

## 2020-06-13 ENCOUNTER — Other Ambulatory Visit: Payer: Self-pay

## 2020-06-13 DIAGNOSIS — Z01818 Encounter for other preprocedural examination: Secondary | ICD-10-CM | POA: Insufficient documentation

## 2020-06-13 DIAGNOSIS — R911 Solitary pulmonary nodule: Secondary | ICD-10-CM

## 2020-06-13 DIAGNOSIS — Z20822 Contact with and (suspected) exposure to covid-19: Secondary | ICD-10-CM | POA: Insufficient documentation

## 2020-06-13 LAB — BLOOD GAS, ARTERIAL
Acid-Base Excess: 2.2 mmol/L — ABNORMAL HIGH (ref 0.0–2.0)
Bicarbonate: 26.1 mmol/L (ref 20.0–28.0)
Drawn by: 602861
FIO2: 21
O2 Saturation: 92.9 %
Patient temperature: 37
pCO2 arterial: 39.9 mmHg (ref 32.0–48.0)
pH, Arterial: 7.432 (ref 7.350–7.450)
pO2, Arterial: 60.5 mmHg — ABNORMAL LOW (ref 83.0–108.0)

## 2020-06-13 LAB — URINALYSIS, ROUTINE W REFLEX MICROSCOPIC
Bilirubin Urine: NEGATIVE
Glucose, UA: NEGATIVE mg/dL
Ketones, ur: NEGATIVE mg/dL
Leukocytes,Ua: NEGATIVE
Nitrite: NEGATIVE
Protein, ur: NEGATIVE mg/dL
Specific Gravity, Urine: 1.006 (ref 1.005–1.030)
pH: 7 (ref 5.0–8.0)

## 2020-06-13 LAB — COMPREHENSIVE METABOLIC PANEL
ALT: 18 U/L (ref 0–44)
AST: 31 U/L (ref 15–41)
Albumin: 3.8 g/dL (ref 3.5–5.0)
Alkaline Phosphatase: 69 U/L (ref 38–126)
Anion gap: 11 (ref 5–15)
BUN: 14 mg/dL (ref 6–20)
CO2: 21 mmol/L — ABNORMAL LOW (ref 22–32)
Calcium: 9.6 mg/dL (ref 8.9–10.3)
Chloride: 106 mmol/L (ref 98–111)
Creatinine, Ser: 0.93 mg/dL (ref 0.44–1.00)
GFR, Estimated: >60 mL/min (ref >60)
Glucose, Bld: 101 mg/dL — ABNORMAL HIGH (ref 70–99)
Potassium: 4.3 mmol/L (ref 3.5–5.1)
Sodium: 138 mmol/L (ref 135–145)
Total Bilirubin: 1.1 mg/dL (ref 0.3–1.2)
Total Protein: 6.6 g/dL (ref 6.5–8.1)

## 2020-06-13 LAB — CBC
HCT: 44.9 % (ref 36.0–46.0)
Hemoglobin: 14.4 g/dL (ref 12.0–15.0)
MCH: 31.1 pg (ref 26.0–34.0)
MCHC: 32.1 g/dL (ref 30.0–36.0)
MCV: 97 fL (ref 80.0–100.0)
Platelets: 166 10*3/uL (ref 150–400)
RBC: 4.63 MIL/uL (ref 3.87–5.11)
RDW: 16.4 % — ABNORMAL HIGH (ref 11.5–15.5)
WBC: 5.2 10*3/uL (ref 4.0–10.5)
nRBC: 0 % (ref 0.0–0.2)

## 2020-06-13 LAB — PROTIME-INR
INR: 1 (ref 0.8–1.2)
Prothrombin Time: 12.3 seconds (ref 11.4–15.2)

## 2020-06-13 LAB — TYPE AND SCREEN
ABO/RH(D): A POS
Antibody Screen: NEGATIVE

## 2020-06-13 LAB — APTT: aPTT: 24 seconds (ref 24–36)

## 2020-06-13 LAB — SURGICAL PCR SCREEN
MRSA, PCR: POSITIVE — AB
Staphylococcus aureus: POSITIVE — AB

## 2020-06-13 LAB — SARS CORONAVIRUS 2 (TAT 6-24 HRS): SARS Coronavirus 2: NEGATIVE

## 2020-06-13 NOTE — Progress Notes (Signed)
Attempted to reach Dr. Leonarda Salon office regarding Positive results from surgical PCR swab. Left message for Thurmond Butts and Manuela Schwartz.  Sent staff message to [name]. No answer at the [phone] number.

## 2020-06-13 NOTE — Progress Notes (Signed)
PCP - Dr. Allyn Kenner Cardiologist - [name] in [facility]  Chest x-ray - 06/13/20 EKG - 06/13/20 Stress Test - Years ago ECHO - Denies Cardiac Cath - Denies  Sleep Study -denies  Blood Thinner Instructions:Plavix last dose was Tuesday 06/10/20 Aspirin Instructions:Patient to call Dr. Leonarda Salon office for instructions on Aspirin  COVID TEST- 06/13/20  Anesthesia review: Yes Cardiac and Pulmonary history  Patient has shortness of breath but due to what she is having done with her surgery, fever, cough and chest pain at PAT appointment   All instructions explained to the patient, with a verbal understanding of the material. Patient agrees to go over the instructions while at home for a better understanding. Patient also instructed to self quarantine after being tested for COVID-19. The opportunity to ask questions was provided.

## 2020-06-13 NOTE — Anesthesia Preprocedure Evaluation (Addendum)
Anesthesia Evaluation  Patient identified by MRN, date of birth, ID band Patient awake    Reviewed: Allergy & Precautions, NPO status , Patient's Chart, lab work & pertinent test results  Airway Mallampati: II  TM Distance: >3 FB Neck ROM: Full    Dental no notable dental hx.    Pulmonary neg pulmonary ROS, Current Smoker and Patient abstained from smoking.,    Pulmonary exam normal breath sounds clear to auscultation       Cardiovascular hypertension, negative cardio ROS Normal cardiovascular exam Rhythm:Regular Rate:Normal     Neuro/Psych negative neurological ROS  negative psych ROS   GI/Hepatic negative GI ROS, Neg liver ROS,   Endo/Other  negative endocrine ROS  Renal/GU negative Renal ROS  negative genitourinary   Musculoskeletal negative musculoskeletal ROS (+)   Abdominal   Peds negative pediatric ROS (+)  Hematology negative hematology ROS (+)   Anesthesia Other Findings   Reproductive/Obstetrics negative OB ROS                            Anesthesia Physical Anesthesia Plan  ASA: III  Anesthesia Plan: Spinal   Post-op Pain Management:    Induction:   PONV Risk Score and Plan:   Airway Management Planned: Oral ETT, Fiberoptic Intubation Planned, Video Laryngoscope Planned and Double Lumen EBT  Additional Equipment:   Intra-op Plan:   Post-operative Plan:   Informed Consent:   Plan Discussed with:   Anesthesia Plan Comments: (See APP note by Durel Salts, FNP )       Anesthesia Quick Evaluation

## 2020-06-13 NOTE — Progress Notes (Signed)
Anesthesia Chart Review:   Case: [id number] Date/Time: 06/16/20 1045   Procedures:      XI ROBOTIC ASSISTED THORASCOPY-RIGHT MIDDLE LOBE WEDGE RESECTION AND RIGHT UPPER LOBE SEGMENTECTOMY (Right Chest)     VIDEO BRONCHOSCOPY WITH ENDOBRONCHIAL NAVIGATION (N/A )   Anesthesia type: General   Pre-op diagnosis: RIGHT UPPER AND MIDDLE LOBE LUNG NODULES   [facility] / [facility]   Surgeons: [name], MD      DISCUSSION:  Pt is a 59 year old with hx HTN, PAD (s/p L iliac artery stent 2013), renal artery stenosis, COPD  - Last dose plavix 06/10/20   VS: BP (!) 138/99   Pulse 74   Temp 36.5 C (Oral)   Resp 20   Ht 5\' 4"  (1.626 m)   Wt 65.9 kg   SpO2 100%   BMI 24.93 kg/m    PROVIDERS: - PCP is Celene Squibb, MD  - Sees cardiologist Quay Burow, MD for peripheral arterial disease. Last office visit 06/11/20 with Coletta Memos, NP who is aware of upcoming surgery - Pulmonologist is Christinia Gully, MD. Last office visit 02/29/20   LABS: Labs reviewed: Acceptable for surgery. (all labs ordered are listed, but only abnormal results are displayed)  Labs Reviewed  SURGICAL PCR SCREEN - Abnormal; Notable for the following components:      Result Value   MRSA, PCR POSITIVE (*)    Staphylococcus aureus POSITIVE (*)    All other components within normal limits  BLOOD GAS, ARTERIAL - Abnormal; Notable for the following components:   pO2, Arterial 60.5 (*)    Acid-Base Excess 2.2 (*)    All other components within normal limits  CBC - Abnormal; Notable for the following components:   RDW 16.4 (*)    All other components within normal limits  COMPREHENSIVE METABOLIC PANEL - Abnormal; Notable for the following components:   CO2 21 (*)    Glucose, Bld 101 (*)    All other components within normal limits  URINALYSIS, ROUTINE W REFLEX MICROSCOPIC - Abnormal; Notable for the following components:   Color, Urine STRAW (*)    Hgb urine dipstick SMALL (*)    Bacteria, UA  RARE (*)    All other components within normal limits  APTT  PROTIME-INR  TYPE AND SCREEN     IMAGES: CXR 06/13/20:  - Right upper lobe nodule again noted.  - Difficult to visualize the right middle lobe nodule seen on prior CT. - No acute cardiopulmonary disease.   CT chest 06/03/20: 1. No significant change in size of right lung nodules. No new suspicious lung nodules identified. 2. Decrease in size of subpleural consolidation within the anterior left lower lobe likely reflecting resolving infection. 3. Similar appearance of diffuse ground-glass and ill-defined centrilobular micro nodularity. Findings are nonspecific and may be seen with respiratory bronchiolitis interstitial lung disease (RB-ILD). 4. Coronary artery atherosclerotic calcifications. 5. Aortic atherosclerosis.   EKG 06/16/20: NSR   CV: Cardiac event monitor 02/18/16: NSR with occasional PVCs   Past Medical History:  Diagnosis Date  . Anxiety   . Arthritis    "hands; spine too" (04/04/2012)  . COPD (chronic obstructive pulmonary disease) ([place])   . Exertional dyspnea   . Folate deficiency 10/17/2015  . GERD (gastroesophageal reflux disease)   . Hyperlipemia   . Hypertension   . Macrocytosis 10/17/2015  . Neck pain, chronic   . PAD (peripheral artery disease) ([place])   . Renal artery stenosis ([place])   .  Seasonal allergies   . Tobacco abuse     Past Surgical History:  Procedure Laterality Date  . ABDOMINAL ANGIOGRAM  04/04/2012   Procedure: ABDOMINAL ANGIOGRAM;  Surgeon: Lorretta Harp, MD;  Location: [facility] CATH LAB;  Service: Cardiovascular;;  . ANTERIOR CERVICAL DECOMP/DISCECTOMY FUSION  ~ 04/2006   C6, 7, T1  . APPENDECTOMY    . COLONOSCOPY N/A 02/26/2014   Adequate preparation. Minimal anal canal hemorrhoids;otherwise, normal rectumdiminutive polyps in the middescending segment; otherwise, the remainder of colonic mucosaappeared normal. Hyperplastic polyps.  . ESOPHAGOGASTRODUODENOSCOPY N/A 02/26/2014    VEH:MCNOBSJG distal esophagus, query short segment Barrett's. Status post Venia Minks dilation with esophageal biopsy. Abnormal gastric mucosa. Esophageal biopsy negative for Barrett's. Gastric biopsy showed mild chronic gastritis, no H pylori.  Marland Kitchen LAPAROSCOPIC APPENDECTOMY N/A 09/10/2013   Procedure: APPENDECTOMY LAPAROSCOPIC;  Surgeon: Jamesetta So, MD;  Location: [facility] ORS;  Service: General;  Laterality: N/A;  . LOWER EXTREMITY ANGIOGRAM N/A 04/04/2012   Procedure: LOWER EXTREMITY ANGIOGRAM;  Surgeon: Lorretta Harp, MD;  Location: [facility] CATH LAB;  Service: Cardiovascular;  Laterality: N/A;  . Venia Minks DILATION N/A 02/26/2014   Procedure: Venia Minks DILATION;  Surgeon: Daneil Dolin, MD;  Location: [facility] ENDO SUITE;  Service: Endoscopy;  Laterality: N/A;  . PERIPHERAL ARTERIAL STENT GRAFT  04/04/2012  . RENAL ANGIOGRAM Left 04/22/2014   Procedure: RENAL ANGIOGRAM;  Surgeon: Lorretta Harp, MD;  Location: [facility] CATH LAB;  Service: Cardiovascular;  Laterality: Left;  . TUBAL LIGATION  1990  . VASCULAR SURGERY  03/31/2016   Peripheral arterial stent  . VIDEO BRONCHOSCOPY WITH ENDOBRONCHIAL NAVIGATION N/A 04/28/2020   Procedure: VIDEO BRONCHOSCOPY WITH ENDOBRONCHIAL NAVIGATION;  Surgeon: [name], MD;  Location: [facility] OR;  Service: Thoracic;  Laterality: N/A;    MEDICATIONS: . acetaminophen (TYLENOL) 500 MG tablet  . albuterol (VENTOLIN HFA) 108 (90 Base) MCG/ACT inhaler  . amLODipine (NORVASC) 5 MG tablet  . aspirin EC 81 MG tablet  . Budeson-Glycopyrrol-Formoterol (BREZTRI AEROSPHERE) 160-9-4.8 MCG/ACT AERO  . cholecalciferol (VITAMIN D3) 25 MCG (1000 UNIT) tablet  . clopidogrel (PLAVIX) 75 MG tablet  . cyclobenzaprine (FLEXERIL) 10 MG tablet  . DEXILANT 60 MG capsule  . escitalopram (LEXAPRO) 10 MG tablet  . fenofibrate 160 MG tablet  . folic acid (FOLVITE) 1 MG tablet  . levofloxacin (LEVAQUIN) 500 MG tablet  . metoprolol tartrate (LOPRESSOR) 25 MG tablet  . Red Yeast Rice 600 MG CAPS  .  rosuvastatin (CRESTOR) 20 MG tablet  . vitamin B-12 (CYANOCOBALAMIN) 1000 MCG tablet   No current facility-administered medications for this encounter.   Last dose plavix 06/10/20   If no changes, I anticipate pt can proceed with surgery as scheduled.   Willeen Cass, PhD, FNP-BC [facility] Short Stay Surgical Center/Anesthesiology Phone: [phone] 06/13/2020 1:57 PM

## 2020-06-16 ENCOUNTER — Other Ambulatory Visit: Payer: Self-pay

## 2020-06-16 ENCOUNTER — Inpatient Hospital Stay (HOSPITAL_COMMUNITY): Payer: Commercial Managed Care - PPO | Admitting: Anesthesiology

## 2020-06-16 ENCOUNTER — Encounter (HOSPITAL_COMMUNITY): Payer: Self-pay | Admitting: Thoracic Surgery (Cardiothoracic Vascular Surgery)

## 2020-06-16 ENCOUNTER — Inpatient Hospital Stay (HOSPITAL_COMMUNITY): Payer: Commercial Managed Care - PPO

## 2020-06-16 ENCOUNTER — Encounter (HOSPITAL_COMMUNITY)
Admission: RE | Disposition: A | Discharge status: Home / Self Care | Payer: Self-pay | Source: Home / Self Care | Attending: Thoracic Surgery (Cardiothoracic Vascular Surgery)

## 2020-06-16 ENCOUNTER — Inpatient Hospital Stay (HOSPITAL_COMMUNITY)
Admission: RE | Admit: 2020-06-16 | Discharge: 2020-06-18 | DRG: 164 | Disposition: A | Discharge status: Home / Self Care | Payer: Commercial Managed Care - PPO | Attending: Thoracic Surgery (Cardiothoracic Vascular Surgery) | Admitting: Thoracic Surgery (Cardiothoracic Vascular Surgery)

## 2020-06-16 DIAGNOSIS — Z09 Encounter for follow-up examination after completed treatment for conditions other than malignant neoplasm: Secondary | ICD-10-CM

## 2020-06-16 DIAGNOSIS — E876 Hypokalemia: Secondary | ICD-10-CM | POA: Diagnosis not present

## 2020-06-16 DIAGNOSIS — I701 Atherosclerosis of renal artery: Secondary | ICD-10-CM | POA: Diagnosis present

## 2020-06-16 DIAGNOSIS — C3411 Malignant neoplasm of upper lobe, right bronchus or lung: Secondary | ICD-10-CM | POA: Diagnosis present

## 2020-06-16 DIAGNOSIS — I4891 Unspecified atrial fibrillation: Secondary | ICD-10-CM | POA: Diagnosis present

## 2020-06-16 DIAGNOSIS — E785 Hyperlipidemia, unspecified: Secondary | ICD-10-CM | POA: Diagnosis present

## 2020-06-16 DIAGNOSIS — Z20822 Contact with and (suspected) exposure to covid-19: Secondary | ICD-10-CM | POA: Diagnosis present

## 2020-06-16 DIAGNOSIS — F17210 Nicotine dependence, cigarettes, uncomplicated: Secondary | ICD-10-CM | POA: Diagnosis present

## 2020-06-16 DIAGNOSIS — R918 Other nonspecific abnormal finding of lung field: Secondary | ICD-10-CM | POA: Diagnosis present

## 2020-06-16 DIAGNOSIS — R911 Solitary pulmonary nodule: Secondary | ICD-10-CM

## 2020-06-16 DIAGNOSIS — C342 Malignant neoplasm of middle lobe, bronchus or lung: Secondary | ICD-10-CM | POA: Diagnosis present

## 2020-06-16 DIAGNOSIS — I2510 Atherosclerotic heart disease of native coronary artery without angina pectoris: Secondary | ICD-10-CM | POA: Diagnosis present

## 2020-06-16 DIAGNOSIS — Z23 Encounter for immunization: Secondary | ICD-10-CM | POA: Diagnosis not present

## 2020-06-16 DIAGNOSIS — Z981 Arthrodesis status: Secondary | ICD-10-CM | POA: Diagnosis not present

## 2020-06-16 DIAGNOSIS — M1990 Unspecified osteoarthritis, unspecified site: Secondary | ICD-10-CM | POA: Diagnosis present

## 2020-06-16 DIAGNOSIS — I739 Peripheral vascular disease, unspecified: Secondary | ICD-10-CM | POA: Diagnosis present

## 2020-06-16 DIAGNOSIS — Z902 Acquired absence of lung [part of]: Secondary | ICD-10-CM

## 2020-06-16 DIAGNOSIS — Z4682 Encounter for fitting and adjustment of non-vascular catheter: Secondary | ICD-10-CM

## 2020-06-16 DIAGNOSIS — Z419 Encounter for procedure for purposes other than remedying health state, unspecified: Secondary | ICD-10-CM

## 2020-06-16 DIAGNOSIS — I10 Essential (primary) hypertension: Secondary | ICD-10-CM | POA: Diagnosis present

## 2020-06-16 HISTORY — PX: INTERCOSTAL NERVE BLOCK: SHX5021

## 2020-06-16 HISTORY — PX: NODE DISSECTION: SHX5269

## 2020-06-16 HISTORY — PX: VIDEO BRONCHOSCOPY WITH ENDOBRONCHIAL NAVIGATION: SHX6175

## 2020-06-16 SURGERY — WEDGE RESECTION, LUNG, ROBOT-ASSISTED, THORACOSCOPIC
Anesthesia: General | Site: Chest | Laterality: Right

## 2020-06-16 MED ORDER — TRAMADOL HCL 50 MG PO TABS
50.0000 mg | ORAL_TABLET | Freq: Four times a day (QID) | ORAL | Status: DC | PRN
  Administered 2020-06-16 – 2020-06-18 (×4): 100 mg via ORAL
  Filled 2020-06-16 (×4): qty 2

## 2020-06-16 MED ORDER — DEXAMETHASONE SODIUM PHOSPHATE 10 MG/ML IJ SOLN
INTRAMUSCULAR | Status: DC | PRN
  Administered 2020-06-16: 10 mg via INTRAVENOUS

## 2020-06-16 MED ORDER — PANTOPRAZOLE SODIUM 40 MG PO TBEC
40.0000 mg | DELAYED_RELEASE_TABLET | Freq: Every day | ORAL | Status: DC
  Administered 2020-06-17 – 2020-06-18 (×2): 40 mg via ORAL
  Filled 2020-06-16 (×2): qty 1

## 2020-06-16 MED ORDER — FENTANYL CITRATE (PF) 100 MCG/2ML IJ SOLN: 25.0000 ug | INTRAMUSCULAR | Status: DC | PRN

## 2020-06-16 MED ORDER — ACETAMINOPHEN 160 MG/5ML PO SOLN: 325.0000 mg | ORAL | Status: DC | PRN

## 2020-06-16 MED ORDER — MOMETASONE FURO-FORMOTEROL FUM 200-5 MCG/ACT IN AERO
2.0000 | INHALATION_SPRAY | Freq: Two times a day (BID) | RESPIRATORY_TRACT | Status: DC
  Administered 2020-06-17 – 2020-06-18 (×3): 2 via RESPIRATORY_TRACT
  Filled 2020-06-16: qty 8.8

## 2020-06-16 MED ORDER — BUPIVACAINE LIPOSOME 1.3 % IJ SUSP
20.0000 mL | INTRAMUSCULAR | Status: DC
  Filled 2020-06-16: qty 20

## 2020-06-16 MED ORDER — MIDAZOLAM HCL 2 MG/2ML IJ SOLN
INTRAMUSCULAR | Status: AC
  Filled 2020-06-16: qty 2

## 2020-06-16 MED ORDER — OXYCODONE HCL 5 MG PO TABS
5.0000 mg | ORAL_TABLET | Freq: Once | ORAL | Status: AC | PRN
  Administered 2020-06-16: 5 mg via ORAL

## 2020-06-16 MED ORDER — ACETAMINOPHEN 500 MG PO TABS
ORAL_TABLET | ORAL | Status: AC
  Filled 2020-06-16: qty 2

## 2020-06-16 MED ORDER — SUGAMMADEX SODIUM 200 MG/2ML IV SOLN
INTRAVENOUS | Status: DC | PRN
  Administered 2020-06-16: 180 mg via INTRAVENOUS

## 2020-06-16 MED ORDER — INDOCYANINE GREEN 25 MG IV SOLR
INTRAVENOUS | Status: AC
  Filled 2020-06-16: qty 10

## 2020-06-16 MED ORDER — BISACODYL 5 MG PO TBEC
10.0000 mg | DELAYED_RELEASE_TABLET | Freq: Every day | ORAL | Status: DC
  Administered 2020-06-17 – 2020-06-18 (×2): 10 mg via ORAL
  Filled 2020-06-16 (×2): qty 2

## 2020-06-16 MED ORDER — CEFAZOLIN SODIUM-DEXTROSE 2-4 GM/100ML-% IV SOLN
2.0000 g | INTRAVENOUS | Status: AC
  Administered 2020-06-16: 2 g via INTRAVENOUS
  Filled 2020-06-16: qty 100

## 2020-06-16 MED ORDER — UMECLIDINIUM BROMIDE 62.5 MCG/INH IN AEPB
1.0000 | INHALATION_SPRAY | Freq: Every day | RESPIRATORY_TRACT | Status: DC
  Administered 2020-06-17 – 2020-06-18 (×2): 1 via RESPIRATORY_TRACT
  Filled 2020-06-16: qty 7

## 2020-06-16 MED ORDER — ESCITALOPRAM OXALATE 10 MG PO TABS
10.0000 mg | ORAL_TABLET | Freq: Every day | ORAL | Status: DC
  Administered 2020-06-17 – 2020-06-18 (×2): 10 mg via ORAL
  Filled 2020-06-16 (×2): qty 1

## 2020-06-16 MED ORDER — OXYCODONE HCL 5 MG PO TABS: 5.0000 mg | ORAL_TABLET | Freq: Once | ORAL | Status: DC | PRN

## 2020-06-16 MED ORDER — LACTATED RINGERS IV SOLN: INTRAVENOUS | Status: DC

## 2020-06-16 MED ORDER — 0.9 % SODIUM CHLORIDE (POUR BTL) OPTIME
TOPICAL | Status: DC | PRN
  Administered 2020-06-16: 2000 mL
  Administered 2020-06-16: 1000 mL

## 2020-06-16 MED ORDER — PROPOFOL 10 MG/ML IV BOLUS
INTRAVENOUS | Status: AC
  Filled 2020-06-16: qty 20

## 2020-06-16 MED ORDER — ROSUVASTATIN CALCIUM 20 MG PO TABS
20.0000 mg | ORAL_TABLET | Freq: Every day | ORAL | Status: DC
  Administered 2020-06-17 – 2020-06-18 (×2): 20 mg via ORAL
  Filled 2020-06-16 (×2): qty 1

## 2020-06-16 MED ORDER — ALBUTEROL SULFATE (2.5 MG/3ML) 0.083% IN NEBU: 2.5000 mg | INHALATION_SOLUTION | Freq: Four times a day (QID) | RESPIRATORY_TRACT | Status: DC | PRN

## 2020-06-16 MED ORDER — BUDESON-GLYCOPYRROL-FORMOTEROL 160-9-4.8 MCG/ACT IN AERO: 2.0000 | INHALATION_SPRAY | RESPIRATORY_TRACT | Status: DC

## 2020-06-16 MED ORDER — CEFAZOLIN SODIUM-DEXTROSE 2-4 GM/100ML-% IV SOLN
2.0000 g | Freq: Three times a day (TID) | INTRAVENOUS | Status: AC
  Administered 2020-06-16 – 2020-06-17 (×2): 2 g via INTRAVENOUS
  Filled 2020-06-16 (×2): qty 100

## 2020-06-16 MED ORDER — MIDAZOLAM HCL 2 MG/2ML IJ SOLN
INTRAMUSCULAR | Status: DC | PRN
  Administered 2020-06-16: 2 mg via INTRAVENOUS

## 2020-06-16 MED ORDER — BUPIVACAINE HCL (PF) 0.5 % IJ SOLN
INTRAMUSCULAR | Status: AC
  Filled 2020-06-16: qty 30

## 2020-06-16 MED ORDER — SODIUM CHLORIDE 0.9 % IV SOLN: INTRAVENOUS | Status: DC | PRN

## 2020-06-16 MED ORDER — CLOPIDOGREL BISULFATE 75 MG PO TABS
75.0000 mg | ORAL_TABLET | Freq: Every day | ORAL | Status: DC
  Administered 2020-06-17 – 2020-06-18 (×2): 75 mg via ORAL
  Filled 2020-06-16 (×2): qty 1

## 2020-06-16 MED ORDER — CHLORHEXIDINE GLUCONATE 0.12 % MT SOLN
15.0000 mL | Freq: Once | OROMUCOSAL | Status: AC
  Administered 2020-06-16: 15 mL via OROMUCOSAL
  Filled 2020-06-16: qty 15

## 2020-06-16 MED ORDER — ACETAMINOPHEN 500 MG PO TABS
1000.0000 mg | ORAL_TABLET | Freq: Once | ORAL | Status: AC | PRN
  Administered 2020-06-16: 1000 mg via ORAL

## 2020-06-16 MED ORDER — ENOXAPARIN SODIUM 40 MG/0.4ML ~~LOC~~ SOLN
40.0000 mg | Freq: Every day | SUBCUTANEOUS | Status: DC
  Administered 2020-06-17 – 2020-06-18 (×2): 40 mg via SUBCUTANEOUS
  Filled 2020-06-16 (×2): qty 0.4

## 2020-06-16 MED ORDER — KETOROLAC TROMETHAMINE 30 MG/ML IJ SOLN
INTRAMUSCULAR | Status: DC | PRN
  Administered 2020-06-16: 15 mg via INTRAVENOUS

## 2020-06-16 MED ORDER — FENTANYL CITRATE (PF) 250 MCG/5ML IJ SOLN
INTRAMUSCULAR | Status: DC | PRN
  Administered 2020-06-16: 100 ug via INTRAVENOUS
  Administered 2020-06-16: 50 ug via INTRAVENOUS
  Administered 2020-06-16: 150 ug via INTRAVENOUS

## 2020-06-16 MED ORDER — KETOROLAC TROMETHAMINE 15 MG/ML IJ SOLN
15.0000 mg | Freq: Four times a day (QID) | INTRAMUSCULAR | Status: DC
  Administered 2020-06-16 – 2020-06-18 (×7): 15 mg via INTRAVENOUS
  Filled 2020-06-16 (×7): qty 1

## 2020-06-16 MED ORDER — SODIUM CHLORIDE FLUSH 0.9 % IV SOLN
INTRAVENOUS | Status: DC | PRN
  Administered 2020-06-16: 100 mL

## 2020-06-16 MED ORDER — FENTANYL CITRATE (PF) 250 MCG/5ML IJ SOLN
INTRAMUSCULAR | Status: AC
  Filled 2020-06-16: qty 5

## 2020-06-16 MED ORDER — ACETAMINOPHEN 500 MG PO TABS
1000.0000 mg | ORAL_TABLET | Freq: Four times a day (QID) | ORAL | Status: DC
  Administered 2020-06-16 – 2020-06-18 (×7): 1000 mg via ORAL
  Filled 2020-06-16 (×7): qty 2

## 2020-06-16 MED ORDER — ONDANSETRON HCL 4 MG/2ML IJ SOLN: 4.0000 mg | Freq: Four times a day (QID) | INTRAMUSCULAR | Status: DC | PRN

## 2020-06-16 MED ORDER — ACETAMINOPHEN 160 MG/5ML PO SOLN: 1000.0000 mg | Freq: Once | ORAL | Status: AC | PRN

## 2020-06-16 MED ORDER — OXYCODONE HCL 5 MG/5ML PO SOLN: 5.0000 mg | Freq: Once | ORAL | Status: AC | PRN

## 2020-06-16 MED ORDER — OXYCODONE HCL 5 MG PO TABS
ORAL_TABLET | ORAL | Status: AC
  Filled 2020-06-16: qty 1

## 2020-06-16 MED ORDER — METHYLENE BLUE 0.5 % INJ SOLN
INTRAVENOUS | Status: AC
  Filled 2020-06-16: qty 10

## 2020-06-16 MED ORDER — ACETAMINOPHEN 10 MG/ML IV SOLN: 1000.0000 mg | Freq: Once | INTRAVENOUS | Status: DC | PRN

## 2020-06-16 MED ORDER — ACETAMINOPHEN 160 MG/5ML PO SOLN: 1000.0000 mg | Freq: Four times a day (QID) | ORAL | Status: DC

## 2020-06-16 MED ORDER — LACTATED RINGERS IV SOLN: INTRAVENOUS | Status: DC | PRN

## 2020-06-16 MED ORDER — ONDANSETRON HCL 4 MG/2ML IJ SOLN: 4.0000 mg | Freq: Once | INTRAMUSCULAR | Status: DC | PRN

## 2020-06-16 MED ORDER — METHYLENE BLUE 0.5 % INJ SOLN
INTRAVENOUS | Status: DC | PRN
  Administered 2020-06-16: 1 mL

## 2020-06-16 MED ORDER — PNEUMOCOCCAL VAC POLYVALENT 25 MCG/0.5ML IJ INJ
0.5000 mL | INJECTION | INTRAMUSCULAR | Status: AC
  Administered 2020-06-18: 0.5 mL via INTRAMUSCULAR
  Filled 2020-06-16: qty 0.5

## 2020-06-16 MED ORDER — ORAL CARE MOUTH RINSE: 15.0000 mL | Freq: Once | OROMUCOSAL | Status: AC

## 2020-06-16 MED ORDER — VANCOMYCIN HCL IN DEXTROSE 1-5 GM/200ML-% IV SOLN
1000.0000 mg | Freq: Once | INTRAVENOUS | Status: AC
  Administered 2020-06-16: 1000 mg via INTRAVENOUS
  Filled 2020-06-16: qty 200

## 2020-06-16 MED ORDER — PROPOFOL 10 MG/ML IV BOLUS
INTRAVENOUS | Status: DC | PRN
  Administered 2020-06-16: 130 mg via INTRAVENOUS
  Administered 2020-06-16: 30 mg via INTRAVENOUS

## 2020-06-16 MED ORDER — FOLIC ACID 1 MG PO TABS
1.0000 mg | ORAL_TABLET | Freq: Every day | ORAL | Status: DC
  Administered 2020-06-17 – 2020-06-18 (×2): 1 mg via ORAL
  Filled 2020-06-16 (×2): qty 1

## 2020-06-16 MED ORDER — SODIUM CHLORIDE 0.9 % IR SOLN
Status: DC | PRN
  Administered 2020-06-16: 1000 mL

## 2020-06-16 MED ORDER — GLYCOPYRROLATE 0.2 MG/ML IJ SOLN
INTRAMUSCULAR | Status: DC | PRN
  Administered 2020-06-16: .2 mg via INTRAVENOUS

## 2020-06-16 MED ORDER — OXYCODONE HCL 5 MG/5ML PO SOLN: 5.0000 mg | Freq: Once | ORAL | Status: DC | PRN

## 2020-06-16 MED ORDER — HEMOSTATIC AGENTS (NO CHARGE) OPTIME
TOPICAL | Status: DC | PRN
  Administered 2020-06-16 (×2): 1 via TOPICAL

## 2020-06-16 MED ORDER — VITAMIN B-12 1000 MCG PO TABS
1000.0000 ug | ORAL_TABLET | Freq: Every day | ORAL | Status: DC
  Administered 2020-06-17 – 2020-06-18 (×2): 1000 ug via ORAL
  Filled 2020-06-16 (×2): qty 1

## 2020-06-16 MED ORDER — MEPERIDINE HCL 25 MG/ML IJ SOLN: 6.2500 mg | INTRAMUSCULAR | Status: DC | PRN

## 2020-06-16 MED ORDER — METOPROLOL TARTRATE 25 MG PO TABS: 25.0000 mg | ORAL_TABLET | Freq: Two times a day (BID) | ORAL | Status: DC | PRN

## 2020-06-16 MED ORDER — SENNOSIDES-DOCUSATE SODIUM 8.6-50 MG PO TABS
1.0000 | ORAL_TABLET | Freq: Every day | ORAL | Status: DC
  Administered 2020-06-16: 1 via ORAL
  Filled 2020-06-16 (×2): qty 1

## 2020-06-16 MED ORDER — SODIUM CHLORIDE 0.9 % IV SOLN: INTRAVENOUS | Status: DC

## 2020-06-16 MED ORDER — ROCURONIUM BROMIDE 10 MG/ML (PF) SYRINGE
PREFILLED_SYRINGE | INTRAVENOUS | Status: DC | PRN
  Administered 2020-06-16: 10 mg via INTRAVENOUS
  Administered 2020-06-16: 100 mg via INTRAVENOUS

## 2020-06-16 MED ORDER — AMLODIPINE BESYLATE 5 MG PO TABS
5.0000 mg | ORAL_TABLET | Freq: Every day | ORAL | Status: DC
  Administered 2020-06-17 – 2020-06-18 (×2): 5 mg via ORAL
  Filled 2020-06-16 (×2): qty 1

## 2020-06-16 MED ORDER — ONDANSETRON HCL 4 MG/2ML IJ SOLN
INTRAMUSCULAR | Status: DC | PRN
  Administered 2020-06-16: 4 mg via INTRAVENOUS

## 2020-06-16 MED ORDER — EPINEPHRINE PF 1 MG/ML IJ SOLN
INTRAMUSCULAR | Status: AC
  Filled 2020-06-16: qty 1

## 2020-06-16 MED ORDER — FENOFIBRATE 160 MG PO TABS
160.0000 mg | ORAL_TABLET | Freq: Every day | ORAL | Status: DC
  Administered 2020-06-17 – 2020-06-18 (×2): 160 mg via ORAL
  Filled 2020-06-16 (×2): qty 1

## 2020-06-16 MED ORDER — ACETAMINOPHEN 325 MG PO TABS: 325.0000 mg | ORAL_TABLET | ORAL | Status: DC | PRN

## 2020-06-16 MED ORDER — ASPIRIN EC 81 MG PO TBEC
81.0000 mg | DELAYED_RELEASE_TABLET | Freq: Every day | ORAL | Status: DC
  Administered 2020-06-17 – 2020-06-18 (×2): 81 mg via ORAL
  Filled 2020-06-16 (×2): qty 1

## 2020-06-16 MED ORDER — PHENYLEPHRINE HCL-NACL 10-0.9 MG/250ML-% IV SOLN
INTRAVENOUS | Status: DC | PRN
  Administered 2020-06-16: 20 ug/min via INTRAVENOUS

## 2020-06-16 SURGICAL SUPPLY — 152 items
ADAPTER BRONCHOSCOPE OLYMP 190 (ADAPTER) ×3 IMPLANT
ADAPTER BRONCHOSCOPE OLYMPUS (ADAPTER) ×3 IMPLANT
ADAPTER VALVE BIOPSY EBUS (MISCELLANEOUS) IMPLANT
ADPTR VALVE BIOPSY EBUS (MISCELLANEOUS)
APPLIER CLIP ROT 10 11.4 M/L (STAPLE)
BLADE CLIPPER SURG (BLADE) IMPLANT
BNDG COHESIVE 6X5 TAN STRL LF (GAUZE/BANDAGES/DRESSINGS) ×3 IMPLANT
BRUSH BIOPSY BRONCH 10 SDTNB (MISCELLANEOUS) IMPLANT
BRUSH SUPERTRAX BIOPSY (INSTRUMENTS) IMPLANT
BRUSH SUPERTRAX NDL-TIP CYTO (INSTRUMENTS) ×3 IMPLANT
CANISTER SUCT 3000ML PPV (MISCELLANEOUS) ×6 IMPLANT
CANNULA REDUC XI 12-8 STAPL (CANNULA) ×2
CANNULA REDUCER 12-8 DVNC XI (CANNULA) ×4 IMPLANT
CATH THORACIC 28FR (CATHETERS) IMPLANT
CATH THORACIC 28FR RT ANG (CATHETERS) IMPLANT
CATH THORACIC 36FR (CATHETERS) IMPLANT
CATH THORACIC 36FR RT ANG (CATHETERS) IMPLANT
CLIP APPLIE ROT 10 11.4 M/L (STAPLE) IMPLANT
CLIP TI WIDE RED SMALL 6 (CLIP) ×3 IMPLANT
CLIP VESOCCLUDE MED 6/CT (CLIP) IMPLANT
CNTNR URN SCR LID CUP LEK RST (MISCELLANEOUS) ×48 IMPLANT
CONN ST 1/4X3/8  BEN (MISCELLANEOUS) ×3
CONN ST 1/4X3/8 BEN (MISCELLANEOUS) ×2 IMPLANT
CONT SPEC 4OZ STRL OR WHT (MISCELLANEOUS) ×72
COVER BACK TABLE 60X90IN (DRAPES) ×3 IMPLANT
DEFOGGER SCOPE WARMER CLEARIFY (MISCELLANEOUS) ×3 IMPLANT
DERMABOND ADVANCED (GAUZE/BANDAGES/DRESSINGS) ×1
DERMABOND ADVANCED .7 DNX12 (GAUZE/BANDAGES/DRESSINGS) ×2 IMPLANT
DRAIN CHANNEL 28F RND 3/8 FF (WOUND CARE) ×3 IMPLANT
DRAIN CHANNEL 32F RND 10.7 FF (WOUND CARE) IMPLANT
DRAPE ARM DVNC X/XI (DISPOSABLE) ×8 IMPLANT
DRAPE COLUMN DVNC XI (DISPOSABLE) ×2 IMPLANT
DRAPE CV SPLIT W-CLR ANES SCRN (DRAPES) ×3 IMPLANT
DRAPE DA VINCI XI ARM (DISPOSABLE) ×4
DRAPE DA VINCI XI COLUMN (DISPOSABLE) ×1
DRAPE HALF SHEET 40X57 (DRAPES) ×3 IMPLANT
DRAPE INCISE IOBAN 66X45 STRL (DRAPES) ×3 IMPLANT
DRAPE ORTHO SPLIT 77X108 STRL (DRAPES) ×3
DRAPE SURG ORHT 6 SPLT 77X108 (DRAPES) ×2 IMPLANT
DRAPE WARM FLUID 44X44 (DRAPES) ×3 IMPLANT
ELECT BLADE 6.5 EXT (BLADE) ×3 IMPLANT
ELECT REM PT RETURN 9FT ADLT (ELECTROSURGICAL) ×3
ELECTRODE REM PT RTRN 9FT ADLT (ELECTROSURGICAL) ×2 IMPLANT
FILTER STRAW FLUID ASPIR (MISCELLANEOUS) ×3 IMPLANT
FORCEPS BIOP SUPERTRX PREMAR (INSTRUMENTS) IMPLANT
GAUZE KITTNER 4X5 RF (MISCELLANEOUS) ×12 IMPLANT
GAUZE SPONGE 4X4 12PLY STRL (GAUZE/BANDAGES/DRESSINGS) ×3 IMPLANT
GLOVE BIOGEL PI IND STRL 7.5 (GLOVE) ×10 IMPLANT
GLOVE BIOGEL PI INDICATOR 7.5 (GLOVE) ×5
GLOVE SURG SIGNA 7.5 PF LTX (GLOVE) ×3 IMPLANT
GLOVE SURG SS PI 8.0 STRL IVOR (GLOVE) ×3 IMPLANT
GLOVE SURG SYN 7.5  E (GLOVE) ×12
GLOVE SURG SYN 7.5 E (GLOVE) ×8 IMPLANT
GOWN STRL REUS W/ TWL LRG LVL3 (GOWN DISPOSABLE) ×4 IMPLANT
GOWN STRL REUS W/ TWL XL LVL3 (GOWN DISPOSABLE) ×8 IMPLANT
GOWN STRL REUS W/TWL 2XL LVL3 (GOWN DISPOSABLE) ×6 IMPLANT
GOWN STRL REUS W/TWL LRG LVL3 (GOWN DISPOSABLE) ×6
GOWN STRL REUS W/TWL XL LVL3 (GOWN DISPOSABLE) ×12
HANDLE STAPLE  ENDO EGIA 4 STD (STAPLE) ×3
HANDLE STAPLE ENDO EGIA 4 STD (STAPLE) ×2 IMPLANT
HEMOSTAT SURGICEL 2X14 (HEMOSTASIS) ×6 IMPLANT
IRRIGATION STRYKERFLOW (MISCELLANEOUS) ×2 IMPLANT
IRRIGATOR STRYKERFLOW (MISCELLANEOUS) ×3
KIT BASIN OR (CUSTOM PROCEDURE TRAY) ×3 IMPLANT
KIT CLEAN ENDO COMPLIANCE (KITS) ×3 IMPLANT
KIT ILLUMISITE 180 PROCEDURE (KITS) ×3 IMPLANT
KIT ILLUMISITE 90 PROCEDURE (KITS) IMPLANT
KIT SUCTION CATH 14FR (SUCTIONS) IMPLANT
KIT TURNOVER KIT B (KITS) ×3 IMPLANT
LOOP VESSEL SUPERMAXI WHITE (MISCELLANEOUS) IMPLANT
MARKER SKIN DUAL TIP RULER LAB (MISCELLANEOUS) ×3 IMPLANT
NEEDLE 18GX1X1/2 (RX/OR ONLY) (NEEDLE) ×3 IMPLANT
NEEDLE 22X1 1/2 (OR ONLY) (NEEDLE) ×3 IMPLANT
NEEDLE HYPO 25GX1X1/2 BEV (NEEDLE) ×3 IMPLANT
NEEDLE SPNL 22GX3.5 QUINCKE BK (NEEDLE) ×3 IMPLANT
NEEDLE SUPERTRX PREMARK BIOPSY (NEEDLE) ×3 IMPLANT
NS IRRIG 1000ML POUR BTL (IV SOLUTION) ×3 IMPLANT
OBTURATOR OPTICAL STANDARD 8MM (TROCAR)
OBTURATOR OPTICAL STND 8 DVNC (TROCAR)
OBTURATOR OPTICALSTD 8 DVNC (TROCAR) IMPLANT
OIL SILICONE PENTAX (PARTS (SERVICE/REPAIRS)) ×3 IMPLANT
PACK CHEST (CUSTOM PROCEDURE TRAY) ×3 IMPLANT
PAD ARMBOARD 7.5X6 YLW CONV (MISCELLANEOUS) ×6 IMPLANT
PATCHES PATIENT (LABEL) ×9 IMPLANT
PORT ACCESS TROCAR AIRSEAL 12 (TROCAR) ×2 IMPLANT
PORT ACCESS TROCAR AIRSEAL 5M (TROCAR) ×1
RELOAD STAPLER 2.5X45 WHT DVNC (STAPLE) ×6 IMPLANT
RELOAD STAPLER 3.5X45 BLU DVNC (STAPLE) ×18 IMPLANT
RELOAD STAPLER 45 4.6 BLK DVNC (STAPLE) ×6 IMPLANT
RELOAD TRI 2.0 30 VAS MED SUL (STAPLE) ×3 IMPLANT
RELOAD TRI 60 ART MED THCK PUR (STAPLE) ×3 IMPLANT
SCISSORS LAP 5X35 DISP (ENDOMECHANICALS) IMPLANT
SEAL CANN UNIV 5-8 DVNC XI (MISCELLANEOUS) ×4 IMPLANT
SEAL XI 5MM-8MM UNIVERSAL (MISCELLANEOUS) ×2
SEALANT PROGEL (MISCELLANEOUS) IMPLANT
SEALANT SURG COSEAL 4ML (VASCULAR PRODUCTS) IMPLANT
SEALANT SURG COSEAL 8ML (VASCULAR PRODUCTS) IMPLANT
SET TRI-LUMEN FLTR TB AIRSEAL (TUBING) ×3 IMPLANT
SET TUBE SMOKE EVAC HIGH FLOW (TUBING) IMPLANT
SOLUTION ELECTROLUBE (MISCELLANEOUS) ×3 IMPLANT
SPONGE INTESTINAL PEANUT (DISPOSABLE) IMPLANT
SPONGE TONSIL TAPE 1 RFD (DISPOSABLE) IMPLANT
STAPLER 45 SUREFORM CVD (STAPLE) ×1
STAPLER 45 SUREFORM CVD DVNC (STAPLE) ×2 IMPLANT
STAPLER CANNULA SEAL DVNC XI (STAPLE) ×4 IMPLANT
STAPLER CANNULA SEAL XI (STAPLE) ×2
STAPLER RELOAD 2.5X45 WHITE (STAPLE) ×3
STAPLER RELOAD 2.5X45 WHT DVNC (STAPLE) ×6
STAPLER RELOAD 3.5X45 BLU DVNC (STAPLE) ×18
STAPLER RELOAD 3.5X45 BLUE (STAPLE) ×9
STAPLER RELOAD 45 4.6 BLK (STAPLE) ×3
STAPLER RELOAD 45 4.6 BLK DVNC (STAPLE) ×6
SUT PDS AB 3-0 SH 27 (SUTURE) IMPLANT
SUT PROLENE 4 0 RB 1 (SUTURE)
SUT PROLENE 4-0 RB1 .5 CRCL 36 (SUTURE) IMPLANT
SUT SILK  1 MH (SUTURE) ×6
SUT SILK 1 MH (SUTURE) ×4 IMPLANT
SUT SILK 1 TIES 10X30 (SUTURE) ×3 IMPLANT
SUT SILK 2 0 SH (SUTURE) IMPLANT
SUT SILK 2 0SH CR/8 30 (SUTURE) IMPLANT
SUT SILK 3 0SH CR/8 30 (SUTURE) ×3 IMPLANT
SUT VIC AB 1 CTX 36 (SUTURE)
SUT VIC AB 1 CTX36XBRD ANBCTR (SUTURE) IMPLANT
SUT VIC AB 2-0 CTX 36 (SUTURE) IMPLANT
SUT VIC AB 3-0 MH 27 (SUTURE) IMPLANT
SUT VIC AB 3-0 X1 27 (SUTURE) ×9 IMPLANT
SUT VICRYL 0 TIES 12 18 (SUTURE) ×3 IMPLANT
SUT VICRYL 0 UR6 27IN ABS (SUTURE) ×6 IMPLANT
SUT VICRYL 2 TP 1 (SUTURE) IMPLANT
SYR 20ML ECCENTRIC (SYRINGE) ×3 IMPLANT
SYR 20ML LL LF (SYRINGE) ×6 IMPLANT
SYR 30ML LL (SYRINGE) ×6 IMPLANT
SYR 5ML LL (SYRINGE) ×3 IMPLANT
SYR BULB IRRIG 60ML STRL (SYRINGE) ×3 IMPLANT
SYSTEM RETRIEVAL ANCHOR 8 (MISCELLANEOUS) ×3 IMPLANT
SYSTEM SAHARA CHEST DRAIN ATS (WOUND CARE) ×3 IMPLANT
TAPE CLOTH 4X10 WHT NS (GAUZE/BANDAGES/DRESSINGS) ×3 IMPLANT
TAPE CLOTH SURG 4X10 WHT LF (GAUZE/BANDAGES/DRESSINGS) ×3 IMPLANT
TIP APPLICATOR SPRAY EXTEND 16 (VASCULAR PRODUCTS) IMPLANT
TOWEL GREEN STERILE (TOWEL DISPOSABLE) ×6 IMPLANT
TOWEL GREEN STERILE FF (TOWEL DISPOSABLE) ×6 IMPLANT
TRAP SPECIMEN MUCUS 40CC (MISCELLANEOUS) ×3 IMPLANT
TRAY FOLEY MTR SLVR 16FR STAT (SET/KITS/TRAYS/PACK) ×3 IMPLANT
TROCAR BLADELESS 15MM (ENDOMECHANICALS) IMPLANT
TROCAR XCEL 12X100 BLDLESS (ENDOMECHANICALS) ×3 IMPLANT
TROCAR XCEL BLADELESS 5X75MML (TROCAR) IMPLANT
TUBE CONNECTING 20X1/4 (TUBING) ×6 IMPLANT
UNDERPAD 30X36 HEAVY ABSORB (UNDERPADS AND DIAPERS) ×3 IMPLANT
VALVE BIOPSY  SINGLE USE (MISCELLANEOUS) ×3
VALVE BIOPSY SINGLE USE (MISCELLANEOUS) ×2 IMPLANT
VALVE SUCTION BRONCHIO DISP (MISCELLANEOUS) ×3 IMPLANT
WATER STERILE IRR 1000ML POUR (IV SOLUTION) ×3 IMPLANT

## 2020-06-16 NOTE — Op Note (Signed)
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  PROCEDURE:   Electromagnetic navigational bronchoscopy for tumor marking,  Xi robotic-assisted right thoracoscopy, Right upper lobe anterior segmentectomy (R3), Right middle lobe wedge resection, Mediastinal lymph node dissection, Intercostal nerve blocks levels 3 through 10.  SURGEON:  Modesto Charon, MD  ASSISTANT:  Jadene Pierini, PA-C  ANESTHESIA:  General.  FINDINGS:  Frozen section of middle lobe nodule showed adenocarcinoma, margin negative.  Bronchial margin on the anterior segment negative.  CLINICAL NOTE:  Connie Farrell is a 59 year old woman with a history of tobacco abuse, who was found to have a lung nodule on a low-dose screening CT about 2 years ago.  This was followed over serial CTs and increased in size over time.  Biopsies had  previously shown the right upper lobe nodule was a nonsmall cell carcinoma, but middle lobe biopsies were nondiagnostic.  There was also question of endobronchial lesion on the left side, but no definitive lesion could be identified there.  She was offered  the option of surgical resection.  Because of the possibility of needing surgery on the left side, the plan was to maximally preserve lung tissue with a plan for a wedge resection of the right middle lobe nodule and the anterior R3 segmentectomy of the  right upper lobe.  The indications, risks, benefits, and alternatives were discussed in detail with the patient.  She understood and accepted the risks and agreed to  proceed.  OPERATIVE NOTE:  Connie Farrell was brought to the operating room on 06/16/2020.  Anesthesia had established central venous access and placed an arterial blood pressure monitoring line in the preoperative holding area.  She was anesthetized and intubated  with a single lumen endotracheal tube.  Sequential compression devices were placed on the calves for DVT prophylaxis.  Intravenous antibiotics were administered.  Flexible fiberoptic bronchoscopy was performed via the endotracheal tube.  It revealed  normal endobronchial anatomy with no endobronchial lesions visible to the subsegmental level.  Locatable guide for navigation was placed.  Registration was performed.  The planning had been done prior to her previous biopsy.  The right middle lobe target was  selected and the bronchoscope was directed to the right middle lobe orifice, the locatable guide was advanced into the lateral segmental bronchus and appropriate subsegmental bronchus and advanced to within 1.7 centimeter of the nodule with good alignment.  Fluoroscopy  was used during the marking.  A needle was advanced and 0.5 mL of a solution containing 50% methylene blue and 50% ICG was injected at that site.  Next, the bronchoscope was directed to the anterior segmental bronchus of the right upper lobe and the  appropriate subsegmental bronchus was cannulated.  The catheter was advanced to within 1.5 cm of the center of the nodule with good alignment.  The marking process was repeated.  The total fluoroscopy time was 0.7 minutes and the total dose was 6  milligray.  The bronchoscope then was removed.  The patient was reintubated with a double lumen endotracheal tube.  Simultaneously, a Foley catheter was placed.  She was placed into a left lateral decubitus position and the  right chest was prepped and draped in the usual sterile fashion.  Single lung  ventilation of the left lung was initiated and was tolerated well throughout the procedure.   Active warming was in place.  A timeout was performed.  A solution containing 20 mL of liposomal bupivacaine, 30 mL of 0.5% bupivacaine and 50 mL of saline was prepared.  This was used for local at the incision sites as well as for the intercostal nerve blocks.  The incision sites  were injected prior to making the incisions.  An 8 mm port was placed in the eighth interspace in approximately the midaxillary line.  The scope was inserted.  There was good isolation of the right lung.  Carbon dioxide was insufflated.  A 12 mm port was  placed in the eighth interspace anteriorly and a 12 mm assistant's port was placed in the tenth interspace centered between the 2 anterior ports.  Intercostal nerve blocks then were performed from the third to the tenth interspace.  A needle was placed  from a posterior approach and 10 mL of the bupivacaine solution was injected into a subpleural plane at each interspace.  Two additional eighth interspace ports were placed posteriorly, one was a 12 mm and the other was an 8 mm.  The robotic instruments  were inserted and sponges were inserted.  The dissection was performed from the console.  The inferior ligament was divided.  All lymph nodes that were encountered during the dissection were removed and sent for permanent pathology with the exception of one level 7 node that was sent for frozen section, which returned negative.  Levels 9, 8,  7, 10, 4, 11, 12, 13 and 14 nodes were sampled during the procedure.  The pleural reflection was divided posteriorly.  The sump node level 11 was removed from the bifurcation of the right upper lobe bronchus and bronchus intermedius.  The fissure was  nearly complete.  The pleural reflection was divided at the hilum superiorly.  The pleura was divided superior to the azygos vein and the level 4R nodes were removed.  Care was taken to stay well below the innominate artery and away from the vagus nerve  with removal of the 4R nodes.  The  pleural reflection was divided at the hilum anteriorly.  The dissection was taken to the fissure and the pulmonary artery was identified in the fissure.  The posterior branch of the right upper lobe vein was identified  and the minor fissure was completed dissecting along this venous branch.  The right middle lobe nodule was visible with the methylene blue and Firefly was also used and confirmed the position of the middle lobe nodule.  A wedge resection was performed  with sequential firings of the robotic stapler using blue cartridges.  A 2 cm margin was maintained grossly.  The specimen was placed into an endoscopic retrieval bag, removed and sent for frozen section, which returned showing adenocarcinoma.  The  margin was negative.  The anterior branches of the superior pulmonary vein were dissected out.  They were encircled and divided with the stapler.  There was a very small branch, it was unclear initially if this was an anterior or an apical branch.  It  was left intact, but then subsequently determined to be an anterior segment branch and it was doubly clipped proximally and divided.  The anterior and apical pulmonary artery branches arose as a common trunk.  The node was dissected out at the bifurcation and the  main  pulmonary artery was identified.  The anterior arterial branch then was encircled and divided with the robotic stapler using a vascular cartridge.  Additional nodes were removed exposing the anterior segmental bronchus.  Care was taken to encircle this  bronchus.  Once that was done, the robotic stapler was placed from the posterior arm.  A blue cartridge was used for the bronchial closure.  The stapler was placed across the bronchus and closed.  A test inflation showed good aeration of the lower and  middle lobes as well as the posterior and apical segments of the upper lobe.  Stapler was fired, transecting the anterior segmental bronchus.  The segmentectomy then was completed with  sequential firings of the robotic stapler maintaining a wide margin  on the known cancer.  Firefly was used to definitively localize the tumor.  The robotic stapler expired before the final portion of the parenchyma was divided.  A Covidien stapler was used to divide the remainder of the parenchyma.  The specimen then was  placed into an endoscopic retrieval bag and also removed through the tenth interspace incision, which had to be widened slightly.  This was sent for frozen section of the bronchial margin, which returned with no tumor seen.  There was some bleeding from the ports and bipolar cautery was used to control that.  The chest was copiously irrigated with warm saline.  A test inflation to 30 cm of water revealed no leakage from the bronchial stump or any obvious leakage from the  parenchyma.  A 28-French Blake drain was placed through the anterior eighth interspace port incision and secured with a #1 silk suture.  The remaining ports were removed.  The dual-lung ventilation was resumed.  The remaining incisions were closed with 0  Vicryl fascial sutures and 3-0 Vicryl subcuticular sutures.  Dermabond was applied.  The chest tube was placed to a Pleur-evac on waterseal.  The patient then was extubated in the operating room and taken to the [facility] in good  condition.  HN/NUANCE  D:06/16/2020 T:06/16/2020 JOB:[id number]

## 2020-06-16 NOTE — Interval H&P Note (Signed)
History and Physical Interval Note:  06/16/2020 11:00 AM  Connie Farrell  has presented today for surgery, with the diagnosis of RIGHT UPPER AND MIDDLE LOBE LUNG NODULES.  The various methods of treatment have been discussed with the patient and family. After consideration of risks, benefits and other options for treatment, the patient has consented to  Procedure(s): XI ROBOTIC ASSISTED THORASCOPY-RIGHT MIDDLE LOBE WEDGE RESECTION AND RIGHT UPPER LOBE SEGMENTECTOMY (Right) VIDEO BRONCHOSCOPY WITH ENDOBRONCHIAL NAVIGATION (N/A) as a surgical intervention.  The patient's history has been reviewed, patient examined, no change in status, stable for surgery.  I have reviewed the patient's chart and labs.  Questions were answered to the patient's satisfaction.     [name]

## 2020-06-16 NOTE — Anesthesia Procedure Notes (Addendum)
Procedure Name: Intubation Date/Time: 06/16/2020 11:45 AM Performed by: Thelma Comp, CRNA Pre-anesthesia Checklist: Patient identified, Emergency Drugs available, Suction available and Patient being monitored Patient Re-evaluated:Patient Re-evaluated prior to induction Oxygen Delivery Method: Circle System Utilized Preoxygenation: Pre-oxygenation with 100% oxygen Induction Type: IV induction Ventilation: Mask ventilation without difficulty Laryngoscope Size: Mac and 3 Grade View: Grade I Tube type: Oral Tube size: 8.5 mm Number of attempts: 1 Airway Equipment and Method: Stylet and Oral airway Placement Confirmation: ETT inserted through vocal cords under direct vision,  positive ETCO2 and breath sounds checked- equal and bilateral Secured at: 22 cm Tube secured with: Tape Dental Injury: Teeth and Oropharynx as per pre-operative assessment  Comments: 8.5 ETT placed for bronch with navigation. After the procedure ETT exchanged for a size 37 DLT.Marland Kitchen Pt had a vagal response during the DL for placement of the DLT and went asystole for 8-10 seconds. Laryngoscope removed and pt's heart rate returned to sinus brady. DL x 2 pt's heart rate dropped to high 30s but was able to place the DLT and pt's heart rate returned to normal. No issues with pt's heart rate during the initial DL for the Bronch. Per Dr. Ermalene Postin most probable cause of pt's extreme vagal response was being light.

## 2020-06-16 NOTE — Anesthesia Postprocedure Evaluation (Signed)
Anesthesia Post Note  Patient: Connie Farrell  Procedure(s) Performed: XI ROBOTIC ASSISTED THORASCOPY-RIGHT MIDDLE LOBE WEDGE RESECTION AND RIGHT UPPER LOBE ANTERIOR SEGMENTECTOMY (Right Chest) VIDEO BRONCHOSCOPY WITH ENDOBRONCHIAL NAVIGATION (N/A ) INTERCOSTAL NERVE BLOCK (Right Chest) NODE DISSECTION (Right Chest)     Patient location during evaluation: PACU Anesthesia Type: General Level of consciousness: awake and alert Pain management: pain level controlled Vital Signs Assessment: post-procedure vital signs reviewed and stable Respiratory status: spontaneous breathing, nonlabored ventilation, respiratory function stable and patient connected to nasal cannula oxygen Cardiovascular status: blood pressure returned to baseline and stable Postop Assessment: no apparent nausea or vomiting Anesthetic complications: no   No complications documented.  Last Vitals:  Vitals:   06/16/20 1900 06/16/20 1930  BP: (!) 144/86   Pulse: 72   Resp: 14 17  Temp: 36.4 C   SpO2: 95%     Last Pain:  Vitals:   06/16/20 1930  TempSrc:   PainSc: 5                  Kasee Hantz

## 2020-06-16 NOTE — Anesthesia Procedure Notes (Signed)
Arterial Line Insertion Start/End11/29/2021 10:00 AM, 06/16/2020 10:10 AM Performed by: Bryson Corona, CRNA, CRNA  Patient location: Pre-op. Preanesthetic checklist: patient identified, IV checked, site marked, risks and benefits discussed, surgical consent, monitors and equipment checked, pre-op evaluation, timeout performed and anesthesia consent Lidocaine 1% used for infiltration Left, radial was placed Catheter size: 20 Fr Hand hygiene performed  and maximum sterile barriers used   Attempts: 1 Procedure performed without using ultrasound guided technique. Following insertion, dressing applied and Biopatch. Post procedure assessment: normal and unchanged  Patient tolerated the procedure well with no immediate complications.

## 2020-06-16 NOTE — Transfer of Care (Signed)
Immediate Anesthesia Transfer of Care Note  Patient: Connie Farrell  Procedure(s) Performed: XI ROBOTIC ASSISTED THORASCOPY-RIGHT MIDDLE LOBE WEDGE RESECTION AND RIGHT UPPER LOBE ANTERIOR SEGMENTECTOMY (Right Chest) VIDEO BRONCHOSCOPY WITH ENDOBRONCHIAL NAVIGATION (N/A ) INTERCOSTAL NERVE BLOCK (Right Chest) NODE DISSECTION (Right Chest)  Patient Location: PACU  Anesthesia Type:General  Level of Consciousness: drowsy  Airway & Oxygen Therapy: Patient Spontanous Breathing and Patient connected to face mask oxygen  Post-op Assessment: Report given to RN and Post -op Vital signs reviewed and stable  Post vital signs: Reviewed and stable  Last Vitals:  Vitals Value Taken Time  BP 142/84 06/16/20 1613  Temp    Pulse 63 06/16/20 1615  Resp 17 06/16/20 1615  SpO2 99 % 06/16/20 1615  Vitals shown include unvalidated device data.  Last Pain:  Vitals:   06/16/20 0931  TempSrc:   PainSc: 0-No pain         Complications: No complications documented.

## 2020-06-16 NOTE — Brief Op Note (Addendum)
06/16/2020  3:55 PM  PATIENT:  Connie Farrell  59 y.o. female  PRE-OPERATIVE DIAGNOSIS:  RIGHT UPPER AND MIDDLE LOBE LUNG NODULES  POST-OPERATIVE DIAGNOSIS:  RIGHT UPPER AND MIDDLE LOBE LUNG NODULES  PROCEDURE:  Procedure(s): XI ROBOTIC ASSISTED THORASCOPY-RIGHT MIDDLE LOBE WEDGE RESECTION AND RIGHT UPPER LOBE ANTERIOR SEGMENTECTOMY (Right) VIDEO BRONCHOSCOPY WITH ENDOBRONCHIAL NAVIGATION (N/A) INTERCOSTAL NERVE BLOCK (Right) NODE DISSECTION (Right)  SURGEON:  Surgeon(s) and Role:    * [name], MD - Primary  PHYSICIAN ASSISTANT: WAYNE GOLD PA-C  ANESTHESIA:   general  EBL:  100 ML   BLOOD ADMINISTERED:none  DRAINS: (1 27 F) Blake drain(s) in the RIGHT HEMITHORAX   LOCAL MEDICATIONS USED:  OTHER EXPAREL  SPECIMEN:  Source of Specimen:  AS ABOVE  DISPOSITION OF SPECIMEN:  PATHOLOGY  COUNTS:  YES  TOURNIQUET:  * No tourniquets in log *  DICTATION: .Other Dictation: Dictation Number [id number]  PLAN OF CARE: Admit to inpatient   PATIENT DISPOSITION:  PACU - hemodynamically stable.   Delay start of Pharmacological VTE agent (>24hrs) due to surgical blood loss or risk of bleeding: yes  FROZEN: + CARCINOMA, NEG MARGINS

## 2020-06-17 ENCOUNTER — Inpatient Hospital Stay (HOSPITAL_COMMUNITY): Payer: Commercial Managed Care - PPO

## 2020-06-17 ENCOUNTER — Encounter (HOSPITAL_COMMUNITY): Payer: Self-pay | Admitting: Thoracic Surgery (Cardiothoracic Vascular Surgery)

## 2020-06-17 LAB — BASIC METABOLIC PANEL
Anion gap: 10 (ref 5–15)
BUN: 10 mg/dL (ref 6–20)
CO2: 25 mmol/L (ref 22–32)
Calcium: 8.6 mg/dL — ABNORMAL LOW (ref 8.9–10.3)
Chloride: 103 mmol/L (ref 98–111)
Creatinine, Ser: 0.93 mg/dL (ref 0.44–1.00)
GFR, Estimated: >60 mL/min (ref >60)
Glucose, Bld: 137 mg/dL — ABNORMAL HIGH (ref 70–99)
Potassium: 3.6 mmol/L (ref 3.5–5.1)
Sodium: 138 mmol/L (ref 135–145)

## 2020-06-17 LAB — CBC
HCT: 41.4 % (ref 36.0–46.0)
Hemoglobin: 13.4 g/dL (ref 12.0–15.0)
MCH: 31.1 pg (ref 26.0–34.0)
MCHC: 32.4 g/dL (ref 30.0–36.0)
MCV: 96.1 fL (ref 80.0–100.0)
Platelets: 163 10*3/uL (ref 150–400)
RBC: 4.31 MIL/uL (ref 3.87–5.11)
RDW: 16 % — ABNORMAL HIGH (ref 11.5–15.5)
WBC: 8.7 10*3/uL (ref 4.0–10.5)
nRBC: 0 % (ref 0.0–0.2)

## 2020-06-17 NOTE — Discharge Summary (Addendum)
Physician Discharge Summary  Patient ID: [name] MRN: [id number] DOB/AGE: [birth year] 59 y.o.  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- pathologic stage IIIA(T4N1) Tobacco use Hypertension Peripheral arterial disease with claudication Dyslipidemia S/P   navigational bronchoscopy for tumor marking,   Xi robotic-assisted right thoracoscopy,  Right upper lobe anterior segmentectomy (R3), and  Right middle lobe wedge resection,    Discharged Condition: stable  History of Present Illness:  Connie Farrell is a 59 year old woman with a medical history significant for tobacco abuse, lung cancer, hypertension, hyperlipidemia, atherosclerotic cardiovascular disease, PAD with iliac stent, renal artery stenosis, reflux, arthritis, macrocytic anemia, and anxiety.  She has a proven non-small cell carcinoma in the inferior aspect of the right upper lobe.  There is a subsolid nodule in the right middle lobe that is highly suspicious for a primary bronchogenic carcinoma as well.  Sampling of that nodule on bronchoscopy was nondiagnostic.  Finally there is a consolidative process in the left lower lobe that appears to be infectious or inflammatory in nature.  We cannot totally rule out the possibility of an endobronchial obstructing lesion.  However bronchoscopy was negative.  Interval CT shows some improvement but not complete resolution of the left lower lobe process.  I think given that we need to go ahead and treat her known non-small cell carcinoma in the right upper lobe definitively.  We will need to keep an eye on the left lower lobe.  At the time of resection for right upper lobe nodule also do a wedge resection of the  middle lobe nodule to get a definitive diagnosis on that.  To do so we will need to mark that nodule with navigational bronchoscopy.  We will also take another look down the left side while we are in there with the scope.  I discussed the proposed procedure of navigational bronchoscopy followed by right robotic VATS for wedge resection of middle lobe nodule and segmentectomy of the upper lobe.  I informed her of the indications, risks, benefits, and alternatives.  She understands the general nature of the procedure including the need for general anesthesia, the incisions to be used, the use of drains to postoperatively, the expected hospital stay, and the overall recovery.  She understands the risks include, but not limited to death, MI, DVT, PE, bleeding, possible need for transfusion, infection, prolonged air leak, cardiac arrhythmias, as well as the possibility of other unforeseeable complications.  Surgery was planned to include ENB for marking, robotic right VATS for right middle lobe wedge resection and right upper lobe segmentectomy on Monday 06/16/2020 She will need a follow-up scan in about 3 months for the left lower lobe lung nodule.  Hospital Course:   Connie Farrell was admitted for elective surgery on 06/16/20 and taken to the OR where electromagnetic navigational bronchoscopy was performed for tumor marking. This wsa followed by robotic-assisted right thoracoscopy, right anterior upper lobe segmentectomy, and right middle lobe wedge resection.  Following the procedure, she was extubated and recovered in the PACU prior to transfer to [facility]. There was no air leak and the CXR post surgery showed good expansion of the right lung. On POD-1, there was again no air leak and minimal CT drainage. The CXR was stable so the chest tube was removed  on POD-1. Follow up CXR showed no PTX. She was mobilized on the first post-op day and made satisfactory progress. She was weaned from supplemental  O2  to room air easily on the first day after surgery.   She was ready for discharge on 06/18/20.  Consults: None  Significant Diagnostic Studies:   CT CHEST WITHOUT CONTRAST  TECHNIQUE: Multidetector CT imaging of the chest was performed following the standard protocol without IV contrast.  COMPARISON: PET-CT 04/15/2019  FINDINGS: Cardiovascular: The heart size appears within normal limits. There is no pericardial effusion. Mild aortic atherosclerosis. Coronary artery atherosclerotic calcifications.  Mediastinum/Nodes: Normal appearance of the thyroid gland. The trachea appears patent and is midline. Normal appearance of the esophagus. No enlarged axillary, supraclavicular, or mediastinal adenopathy. Hilar lymph nodes are suboptimally evaluated due to lack of IV contrast material.  Lungs/Pleura: As mentioned previously there is heterogeneous diffuse ground-glass and ill-defined centrilobular micro nodularity. No pleural effusion identified. Subpleural consolidation within the anterior left lower lobe measures 3.1 x 1.5 cm, image 87/8. Previously this measured 3.7 x 2.6 cm.  Multiple lung nodules are again identified:  -the solid nodule within the periphery of the right upper lobe measures 1.1 cm, image 63/8. Previously 1 cm.  -Right middle lobe lung nodule measures 0.9 cm, image 79/8. Unchanged.  -3 mm right lower lobe lung nodule is noted, image 81/8. Unchanged.  -Within the right upper lobe there is a 4 mm sub solid nodule, image 55/8. Also stable.  -Small solid nodule within the posterior right upper lobe measures 4 mm, image 32/8. Stable.  No new or enlarging lung nodules  Upper Abdomen: No acute abnormality.  Musculoskeletal: Status post ACDF. Multi level spondylosis noted within the thoracic spine. No acute or suspicious osseous findings.  IMPRESSION: 1. No significant change in size of right lung nodules. No new suspicious lung nodules  identified. 2. Decrease in size of subpleural consolidation within the anterior left lower lobe likely reflecting resolving infection. 3. Similar appearance of diffuse ground-glass and ill-defined centrilobular micro nodularity. Findings are nonspecific and may be seen with respiratory bronchiolitis interstitial lung disease (RB-ILD). 4. Coronary artery atherosclerotic calcifications. 5. Aortic atherosclerosis.   EXAM: PORTABLE CHEST 1 VIEW  COMPARISON:  June 17, 2020 study obtained earlier in the day; June 16, 2020  FINDINGS: Right-sided chest tube has been removed without evident pneumothorax. Postoperative changes noted on the right with apparent volume loss in the right mid lung. There is mild left base atelectasis. Left lung otherwise clear. Heart size and pulmonary vascular normal. No adenopathy. No bone lesions.  IMPRESSION: No right-sided pneumothorax following chest tube removal. Postoperative changes on the right with areas of suspected atelectasis. Apparent stable on the right compared to recent prior studies. Mild left base atelectasis. Left lung otherwise clear. Stable cardiac silhouette.   Electronically Signed   By: Lowella Grip III M.D.   On: 06/17/2020 11:41  Treatments:   OPERATIVE REPORT  DATE OF PROCEDURE:  06/16/2020  PREOPERATIVE DIAGNOSES:  Nonsmall cell carcinoma, right upper lobe and right middle lobe lung nodule.  POSTOPERATIVE DIAGNOSES:  Nonsmall cell carcinoma, right upper lobe and right middle lobe lung nodule.  PROCEDURE:  Electromagnetic navigational bronchoscopy for tumor marking, Xi robotic-assisted right thoracoscopy, right upper lobe anterior segmentectomy (R3), right middle lobe wedge resection, mediastinal lymph node dissection, intercostal nerve blocks  at levels 3 through 10.  SURGEON:  Modesto Charon, MD  ASSISTANT:  Jadene Pierini, PA-C  ANESTHESIA:  General.  FINDINGS:  Frozen section of  middle lobe nodule showed adenocarcinoma, margin negative, bronchial margin on the anterior segment negative.  CLINICAL NOTE:  The patient is a 59 year old woman with a history of tobacco abuse, who was found to have a lung nodule on a low-dose screening CT about 2 years ago.  This was followed over serial CTs and increased in size over time.  Biopsies had  previously shown the right upper lobe nodule was a nonsmall cell carcinoma, middle lobe biopsies were nondiagnostic.  There was also question of endobronchial lesion on the left side, but no definitive lesion could be identified there.  She was offered  the option of surgical resection.  Because of the possibility of needing surgery on the left side, the plan was to maximally preserve lung tissue with a plan for a wedge resection of the right middle lobe nodule and the anterior R3 segmentectomy of the  right upper lobe.  The indications, risks, benefits, and alternatives were discussed in detail with the patient.  She understood and accepted the risks and agreed to proceed.  Discharge Exam: Blood pressure (!) 152/83, pulse 70, temperature 97.8 F (36.6 C), temperature source Oral, resp. rate 19, height 5\' 4"  (1.626 m), weight 65.9 kg, SpO2 94 %.  General appearance: alert, cooperative and no distress Neurologic: intact Heart: regular rate and rhythm Lungs: Few coarse cracklse on the right.  CT removed yesterday,  CXR shows full expansion of the right lung and expected post surgical changes. .  Wound: the right chest dressings are dry.   Disposition:    Allergies as of 06/18/2020      Reactions   Latex Rash   Blisters   Codeine Nausea And Vomiting      Medication List    STOP taking these medications   levofloxacin 500 MG tablet Commonly known as: Levaquin     TAKE these medications   acetaminophen 500 MG tablet Commonly known as: TYLENOL Take 500 mg by mouth every 6 (six) hours as needed for moderate pain.   albuterol 108  (90 Base) MCG/ACT inhaler Commonly known as: VENTOLIN HFA Inhale 1-2 puffs into the lungs every 6 (six) hours as needed for wheezing or shortness of breath.   amLODipine 5 MG tablet Commonly known as: NORVASC Take 1 tablet (5 mg total) by mouth daily.   aspirin EC 81 MG tablet Take 81 mg by mouth daily.   Breztri Aerosphere 160-9-4.8 MCG/ACT Aero Generic drug: Budeson-Glycopyrrol-Formoterol Inhale 2 puffs into the lungs in the morning and at bedtime. Take 2 puffs first thing in am and then another 2 puffs about 12 hours later. What changed: additional instructions   cholecalciferol 25 MCG (1000 UNIT) tablet Commonly known as: VITAMIN D3 Take 1,000 Units by mouth daily.   clopidogrel 75 MG tablet Commonly known as: PLAVIX Take 1 tablet (75 mg total) by mouth daily. PLEASE SCHEDULE APPOINTMENT FOR FURTHER REFILLS. 2ND ATTEMPT What changed: additional instructions   cyclobenzaprine 10 MG tablet Commonly known as: FLEXERIL Take 10 mg by mouth 3 (three) times daily as needed for muscle spasms.   Dexilant 60 MG capsule Generic drug: dexlansoprazole TAKE 1 CAPSULE ONCE DAILY What changed: how much to take   escitalopram 10 MG tablet Commonly known as: LEXAPRO Take 10 mg by mouth daily.   fenofibrate 160 MG tablet Take 160 mg by mouth daily.   folic acid 1 MG tablet Commonly known as: FOLVITE Take 1 tablet (1 mg total) by mouth daily.   metoprolol tartrate 25 MG tablet Commonly  known as: LOPRESSOR Take 1 tablet (25 mg total) by mouth 2 (two) times daily as needed (afib).   Red Yeast Rice 600 MG Caps Take 600 mg by mouth daily.   rosuvastatin 20 MG tablet Commonly known as: CRESTOR Take 20 mg by mouth daily.   traMADol 50 MG tablet Commonly known as: ULTRAM Take 1 tablet (50 mg total) by mouth every 6 (six) hours as needed for up to 5 days (mild pain).   vitamin B-12 1000 MCG tablet Commonly known as: CYANOCOBALAMIN Take 1 tablet (1,000 mcg total) by mouth  daily.       Follow-up Information    [name], MD. Go on 07/22/2020.   Specialty: Cardiothoracic Surgery Why: Your appointment with [name] is at 9:45 AM.  Please arrive 30 minutes early for a chest x-ray to be performed by [facility] located on the first floor of the same building. Contact information: [address] [place] [address] [phone]        [facility]. Go on 06/24/2020.   Specialty: Cardiothoracic Surgery Why: Your appointment for suture removal is at 10 AM Contact information: [address] [place], [place] [phone]              Signed: Antony Odea, PA-C 06/18/2020, 7:44 AM

## 2020-06-17 NOTE — Progress Notes (Addendum)
[address]       [place] [address]             [phone]      1 Day Post-Op Procedure(s) (LRB): XI ROBOTIC ASSISTED THORASCOPY-RIGHT MIDDLE LOBE WEDGE RESECTION AND RIGHT UPPER LOBE ANTERIOR SEGMENTECTOMY (Right) VIDEO BRONCHOSCOPY WITH ENDOBRONCHIAL NAVIGATION (N/A) INTERCOSTAL NERVE BLOCK (Right) NODE DISSECTION (Right) Subjective: Awake and alert. Says she is doing well. No concerns.  Tolerating PO's  Objective: Vital signs in last 24 hours: Temp:  [97 F (36.1 C)-98.8 F (37.1 C)] 97.7 F (36.5 C) (11/30 0300) Pulse Rate:  [63-72] 64 (11/30 0300) Cardiac Rhythm: Normal sinus rhythm (11/29 1839) Resp:  [14-20] 16 (11/30 0501) BP: (129-183)/(73-101) 131/90 (11/30 0300) SpO2:  [94 %-100 %] 94 % (11/30 0300) Arterial Line BP: (133-154)/(69-76) 150/71 (11/29 1715) Weight:  [65.9 kg] 65.9 kg (11/29 0912)     Intake/Output from previous day: 11/29 0701 - 11/30 0700 In: 2847.3 [P.O.:720; I.V.:2027.3] Out: 1450 [Urine:1150; Blood:150; Chest Tube:150] Intake/Output this shift: No intake/output data recorded.  General appearance: alert, cooperative and no distress Neurologic: intact Heart: regular rate and rhythm Lungs: Few coarse cracklse on the right. Otherwise CTA. Chest tube is on water seal. No air leak, minimal drainage.  CXR shows full expansion of the right lung.  Wound: the right chest dressings are dry.   Lab Results: Recent Labs    06/17/20 0101  WBC 8.7  HGB 13.4  HCT 41.4  PLT 163   BMET:  Recent Labs    06/17/20 0101  NA 138  K 3.6  CL 103  CO2 25  GLUCOSE 137*  BUN 10  CREATININE 0.93  CALCIUM 8.6*    PT/INR: No results for input(s): LABPROT, INR in the last 72 hours. ABG    Component Value Date/Time   PHART 7.432 06/13/2020 1109   HCO3 26.1 06/13/2020 1109   TCO2 20.1 05/05/2007 1250   ACIDBASEDEF 4.7 (H) 05/05/2007 1250   O2SAT 92.9 06/13/2020 1109   CBG (last 3)  No results for input(s): GLUCAP in  the last 72 hours.  CHEST  1 VIEW  COMPARISON:  06/16/2020.  CT 06/03/2020.  FINDINGS: Postsurgical changes are again noted the right lung with associated right upper and right mid lung atelectasis and or infiltrates. Improved bibasilar atelectasis both lung bases. Right chest tube in stable position. No pneumothorax. Heart size stable. Prior thoracic spine fusion.  IMPRESSION: 1. Postsurgical changes in the right lung with associated right upper and right mid lung atelectasis and or infiltrates. Right chest tube in stable position. No pneumothorax. 2. Improved bibasilar atelectasis.   Electronically Signed   By: Marcello Moores  Register   On: 06/17/2020 06:45    Assessment/Plan: S/P Procedure(s) (LRB): XI ROBOTIC ASSISTED THORASCOPY-RIGHT MIDDLE LOBE WEDGE RESECTION AND RIGHT UPPER LOBE ANTERIOR SEGMENTECTOMY (Right) VIDEO BRONCHOSCOPY WITH ENDOBRONCHIAL NAVIGATION (N/A) INTERCOSTAL NERVE BLOCK (Right) NODE DISSECTION (Right)  -POD1 right ropot-assisted wedge resection and RUL segmentectomy for NSCCL. Doing well from respiratory standpoint and pain is well controlled.  CT on water seal with no air leak. Mobilize, work on pulmonary hygiene,  decrease IVF, D/C Foley catheter.   -History of HTN- stable BP, her metoprolol and amlodipine have been resumed.   -DVT PPX- continue enoxaparin, ambulate.   -History of PAD- Plavix resumed.     LOS: 1 day    Antony Odea 06/17/2020 Patient seen and examined, agree with above No air leak and minimal drainage- will dc  chest tube Resume Plavix Ambulate   Remo Lipps C. Roxan Hockey, MD [facility] [phone]

## 2020-06-17 NOTE — Discharge Instructions (Signed)

## 2020-06-18 ENCOUNTER — Inpatient Hospital Stay (HOSPITAL_COMMUNITY): Payer: Commercial Managed Care - PPO

## 2020-06-18 LAB — COMPREHENSIVE METABOLIC PANEL
ALT: 22 U/L (ref 0–44)
AST: 31 U/L (ref 15–41)
Albumin: 2.9 g/dL — ABNORMAL LOW (ref 3.5–5.0)
Alkaline Phosphatase: 55 U/L (ref 38–126)
Anion gap: 10 (ref 5–15)
BUN: 8 mg/dL (ref 6–20)
CO2: 25 mmol/L (ref 22–32)
Calcium: 9 mg/dL (ref 8.9–10.3)
Chloride: 105 mmol/L (ref 98–111)
Creatinine, Ser: 0.89 mg/dL (ref 0.44–1.00)
GFR, Estimated: >60 mL/min (ref >60)
Glucose, Bld: 114 mg/dL — ABNORMAL HIGH (ref 70–99)
Potassium: 3 mmol/L — ABNORMAL LOW (ref 3.5–5.1)
Sodium: 140 mmol/L (ref 135–145)
Total Bilirubin: 0.5 mg/dL (ref 0.3–1.2)
Total Protein: 5.7 g/dL — ABNORMAL LOW (ref 6.5–8.1)

## 2020-06-18 LAB — CBC
HCT: 37.6 % (ref 36.0–46.0)
Hemoglobin: 12.2 g/dL (ref 12.0–15.0)
MCH: 31.3 pg (ref 26.0–34.0)
MCHC: 32.4 g/dL (ref 30.0–36.0)
MCV: 96.4 fL (ref 80.0–100.0)
Platelets: 152 10*3/uL (ref 150–400)
RBC: 3.9 MIL/uL (ref 3.87–5.11)
RDW: 16.3 % — ABNORMAL HIGH (ref 11.5–15.5)
WBC: 7.4 10*3/uL (ref 4.0–10.5)
nRBC: 0 % (ref 0.0–0.2)

## 2020-06-18 MED ORDER — METOPROLOL TARTRATE 25 MG PO TABS
25.0000 mg | ORAL_TABLET | Freq: Two times a day (BID) | ORAL | Status: DC
  Administered 2020-06-18: 25 mg via ORAL
  Filled 2020-06-18: qty 1

## 2020-06-18 MED ORDER — TRAMADOL HCL 50 MG PO TABS: 50.0000 mg | ORAL_TABLET | Freq: Four times a day (QID) | ORAL | 0 refills | Status: AC | PRN

## 2020-06-18 MED ORDER — POTASSIUM CHLORIDE CRYS ER 20 MEQ PO TBCR
40.0000 meq | EXTENDED_RELEASE_TABLET | Freq: Once | ORAL | Status: AC
  Administered 2020-06-18: 40 meq via ORAL
  Filled 2020-06-18: qty 2

## 2020-06-18 NOTE — Addendum Note (Signed)
Addendum  created 06/18/20 1450 by Janeece Riggers, MD   Attestation recorded in [place], [place] filed

## 2020-06-18 NOTE — Progress Notes (Addendum)
      [address]       [place] [address]             [phone]      2 Days Post-Op Procedure(s) (LRB): XI ROBOTIC ASSISTED THORASCOPY-RIGHT MIDDLE LOBE WEDGE RESECTION AND RIGHT UPPER LOBE ANTERIOR SEGMENTECTOMY (Right) VIDEO BRONCHOSCOPY WITH ENDOBRONCHIAL NAVIGATION (N/A) INTERCOSTAL NERVE BLOCK (Right) NODE DISSECTION (Right) Subjective:  Awake and alert. No new concerns.  Maintaining O2 sats on RA.   Objective: Vital signs in last 24 hours: Temp:  [97.6 F (36.4 C)-98.1 F (36.7 C)] 97.8 F (36.6 C) (12/01 0717) Pulse Rate:  [70-80] 70 (12/01 0717) Cardiac Rhythm: Normal sinus rhythm (12/01 0708) Resp:  [17-20] 19 (12/01 0717) BP: (146-165)/(73-87) 152/83 (12/01 0717) SpO2:  [93 %-96 %] 94 % (12/01 0717)     Intake/Output from previous day: 11/30 0701 - 12/01 0700 In: 120 [P.O.:120] Out: 5 [Urine:4; Stool:1] Intake/Output this shift: No intake/output data recorded.  General appearance: alert, cooperative and no distress Neurologic: intact Heart: regular rate and rhythm Lungs: Few coarse cracklse on the right.  CT removed yesterday,  CXR shows full expansion of the right lung and expected post surgical changes. .  Wound: the right chest dressings are dry.   Lab Results: Recent Labs    06/17/20 0101 06/18/20 0231  WBC 8.7 7.4  HGB 13.4 12.2  HCT 41.4 37.6  PLT 163 152   BMET:  Recent Labs    06/17/20 0101 06/18/20 0231  NA 138 140  K 3.6 3.0*  CL 103 105  CO2 25 25  GLUCOSE 137* 114*  BUN 10 8  CREATININE 0.93 0.89  CALCIUM 8.6* 9.0    PT/INR: No results for input(s): LABPROT, INR in the last 72 hours. ABG    Component Value Date/Time   PHART 7.432 06/13/2020 1109   HCO3 26.1 06/13/2020 1109   TCO2 20.1 05/05/2007 1250   ACIDBASEDEF 4.7 (H) 05/05/2007 1250   O2SAT 92.9 06/13/2020 1109   CBG (last 3)  No results for input(s): GLUCAP in the last 72 hours.    Assessment/Plan: S/P Procedure(s) (LRB): XI ROBOTIC  ASSISTED THORASCOPY-RIGHT MIDDLE LOBE WEDGE RESECTION AND RIGHT UPPER LOBE ANTERIOR SEGMENTECTOMY (Right) VIDEO BRONCHOSCOPY WITH ENDOBRONCHIAL NAVIGATION (N/A) INTERCOSTAL NERVE BLOCK (Right) NODE DISSECTION (Right)  -POD2 right robot-assisted wedge resection and RUL segmentectomy for NSCCL. Final path pending. Doing well from respiratory standpoint and pain is well controlled.  CT removed yesterday and CXR is stable. Plan discharge to home today.  -History of HTN- SBP in the 150's, metoprolol had not been given as it was ordered "PRN a-fib". Advised to resume scheduled  BID dosing after diwscharge.  -Hypokalemia- Will supplement this morning.   -History of PAD- Plavix resumed.     LOS: 2 days    Antony Odea 06/18/2020 Patient seen and examined, agree with above Supplement K DC home today Path still pending  Remo Lipps C. Roxan Hockey, MD [facility] [phone]

## 2020-06-19 ENCOUNTER — Telehealth: Payer: Self-pay

## 2020-06-19 LAB — SURGICAL PATHOLOGY

## 2020-06-19 NOTE — Telephone Encounter (Signed)
Patient's husband, [name] contacted the office concerned about patient's chest tube site.  He stated that he was helping her into the bed and she accidentally pulled on the previous chest tube incision site causing thin, pink-tinged fluid to drain from the site.  He stated that he had applied pressure and applied a bandage, but the drainage soaked the band-aid.  She is s/p Right RATS Wedge/ Segmentectomy with [name] 06/16/20 and was discharged yesterday 06/18/20.  Advised that some drainage after opening the incision accidentally is normal and may take a couple dressing changed for the drainage to resolve.  Advised to give the office a call back if it does not stop or if she shows signs/ symptoms of infection, increased redness, pain, swelling, thick purulent drainage or starts to run a temperature.  He acknowledged receipt.

## 2020-06-24 ENCOUNTER — Other Ambulatory Visit: Payer: Self-pay

## 2020-06-24 ENCOUNTER — Encounter (INDEPENDENT_AMBULATORY_CARE_PROVIDER_SITE_OTHER): Payer: Self-pay

## 2020-06-24 DIAGNOSIS — Z4802 Encounter for removal of sutures: Secondary | ICD-10-CM

## 2020-06-25 ENCOUNTER — Other Ambulatory Visit: Payer: Self-pay | Admitting: Thoracic Surgery (Cardiothoracic Vascular Surgery)

## 2020-06-25 ENCOUNTER — Telehealth: Payer: Self-pay

## 2020-06-25 MED ORDER — TRAMADOL HCL 50 MG PO TABS
50.0000 mg | ORAL_TABLET | Freq: Four times a day (QID) | ORAL | 0 refills | Status: DC | PRN
Start: 2020-06-25 — End: 2020-07-29

## 2020-06-25 NOTE — Telephone Encounter (Signed)
-----   Message from [name], MD sent at 06/25/2020  4:52 PM EST ----- Regarding: RE: Tramadol refill Done  [facility] ----- Message ----- From: Donnella Sham, RN Sent: 06/25/2020   2:54 PM EST To: [name], MD Subject: Tramadol refill                                Hey,  Patient is requesting refill of Tramadol. Pain at incision sites, surgery 06/16/20, Dc' d 06/18/20 .  Per patient she is also taking Tylenol and only uses the Tramadol when needed. Pharmacy info:  [facility], [place]  Phone:  [phone] Fax:  [phone]  Thanks,  Caryl Pina

## 2020-06-25 NOTE — Progress Notes (Signed)
Reordered tramadol  Revonda Standard. Roxan Hockey, MD [facility] [phone]

## 2020-06-26 ENCOUNTER — Other Ambulatory Visit: Payer: Self-pay | Admitting: *Deleted

## 2020-06-26 NOTE — Progress Notes (Signed)
The proposed treatment discussed in cancer conference 06/26/20 is for discussion purpose only and is not a binding recommendation.  The patient was not physically examined nor present for their treatment options. Therefore, final treatment plans cannot be decided.

## 2020-07-01 ENCOUNTER — Telehealth: Payer: Self-pay

## 2020-07-01 NOTE — Telephone Encounter (Signed)
Patient contacted the office for a refill of pain medications, which was filled and advised that it could be picked up at preferred pharmacy.  She is s/p Right RATS RML wedge/ RUL lobectomy with [name] 06/16/20 She stated that she was having problems with pain at her previous chest tube suture site.  She stated that it was painful, but did not give an appearence of infection.  She stated that she had been massaging the area and has been applying Vaseline to the area.  Advised to not apply anything to a healing wound accept soap and water and keep it clean and dry.  Advised to give the office a call back if she started to show signs/ symptoms of infection.  She acknowledged receipt.

## 2020-07-21 ENCOUNTER — Other Ambulatory Visit: Payer: Self-pay | Admitting: Thoracic Surgery (Cardiothoracic Vascular Surgery)

## 2020-07-21 DIAGNOSIS — R918 Other nonspecific abnormal finding of lung field: Secondary | ICD-10-CM

## 2020-07-22 ENCOUNTER — Other Ambulatory Visit: Payer: Self-pay

## 2020-07-22 ENCOUNTER — Ambulatory Visit
Admission: RE | Admit: 2020-07-22 | Discharge: 2020-07-22 | Disposition: A | Discharge status: Home / Self Care | Payer: Commercial Managed Care - PPO | Source: Ambulatory Visit | Attending: Thoracic Surgery (Cardiothoracic Vascular Surgery) | Admitting: Thoracic Surgery (Cardiothoracic Vascular Surgery)

## 2020-07-22 ENCOUNTER — Ambulatory Visit (INDEPENDENT_AMBULATORY_CARE_PROVIDER_SITE_OTHER): Payer: Self-pay | Admitting: Thoracic Surgery (Cardiothoracic Vascular Surgery)

## 2020-07-22 VITALS — BP 140/85 | HR 64 | Temp 97.6°F | Resp 20 | Ht 64.0 in | Wt 140.0 lb

## 2020-07-22 DIAGNOSIS — C3491 Malignant neoplasm of unspecified part of right bronchus or lung: Secondary | ICD-10-CM

## 2020-07-22 DIAGNOSIS — R918 Other nonspecific abnormal finding of lung field: Secondary | ICD-10-CM

## 2020-07-22 DIAGNOSIS — Z09 Encounter for follow-up examination after completed treatment for conditions other than malignant neoplasm: Secondary | ICD-10-CM

## 2020-07-22 NOTE — Progress Notes (Signed)
[address]       [place] [address]             [phone]     HPI: Ms. Fore returns for a scheduled postoperative follow-up visit  Connie Farrell is a 60 year old woman with a history of tobacco abuse, lung cancer, hypertension, hyperlipidemia, atherosclerotic cardiovascular disease, PAD with stent, renal artery stenosis, reflux, arthritis, macrocytic anemia, and anxiety.  She was first found to have a lung nodule on a low-dose screening CT in September 2019.  She was followed with serial CTs.  By September 2021 there was an increase in size in the nodule, and there also was a new 8 mm nodule in the right middle lobe. There also was significant hypermetabolic activity in the left lower lobe that was felt to be infectious or inflammatory in nature.  I did a navigational bronchoscopy.  Biopsies of the right upper lobe nodule showed non-small cell carcinoma.  Biopsies from the left lower lobe showed no malignancy but were nonspecific.  She was treated with a course of antibiotics and there was some improvement in the left lower lobe process.  I did a robotic right upper lobe segmentectomy and right middle lobe wedge resection with node dissection on 06/16/2020.  Her postoperative course was uncomplicated and she went home on day 2.  Pathology showed both the upper lobe and middle lobe nodules were adenocarcinoma.  There was one level 13 lymph node involved.  Final stage was T4, N1, IIIA).  She currently is doing well.  She has some incisional discomfort and is taking acetaminophen for that.  She is not requiring any narcotics.  She does at times feel some shortness of breath.  She has not smoked since the day prior to her surgery.  Overall she feels well.  Past Medical History:  Diagnosis Date  . Anxiety   . Arthritis    "hands; spine too" (04/04/2012)  . COPD (chronic obstructive pulmonary disease) ([place])   . Exertional dyspnea   . Folate deficiency 10/17/2015  . GERD  (gastroesophageal reflux disease)   . Hyperlipemia   . Hypertension   . Macrocytosis 10/17/2015  . Neck pain, chronic   . PAD (peripheral artery disease) ([place])   . Renal artery stenosis ([place])   . Seasonal allergies   . Tobacco abuse     Current Outpatient Medications  Medication Sig Dispense Refill  . acetaminophen (TYLENOL) 500 MG tablet Take 500 mg by mouth every 6 (six) hours as needed for moderate pain.    Marland Kitchen albuterol (VENTOLIN HFA) 108 (90 Base) MCG/ACT inhaler Inhale 1-2 puffs into the lungs every 6 (six) hours as needed for wheezing or shortness of breath.    Marland Kitchen aspirin EC 81 MG tablet Take 81 mg by mouth daily.    . Budeson-Glycopyrrol-Formoterol (BREZTRI AEROSPHERE) 160-9-4.8 MCG/ACT AERO Inhale 2 puffs into the lungs in the morning and at bedtime. Take 2 puffs first thing in am and then another 2 puffs about 12 hours later. (Patient taking differently: Inhale 2 puffs into the lungs in the morning and at bedtime.) 10.7 g 11  . cholecalciferol (VITAMIN D3) 25 MCG (1000 UNIT) tablet Take 1,000 Units by mouth daily.    . clopidogrel (PLAVIX) 75 MG tablet Take 1 tablet (75 mg total) by mouth daily. PLEASE SCHEDULE APPOINTMENT FOR FURTHER REFILLS. 2ND ATTEMPT (Patient taking differently: Take 75 mg by mouth daily.) 15 tablet 0  . cyclobenzaprine (FLEXERIL) 10 MG tablet Take  10 mg by mouth 3 (three) times daily as needed for muscle spasms.    . DEXILANT 60 MG capsule TAKE 1 CAPSULE ONCE DAILY (Patient taking differently: Take 60 mg by mouth daily.) 90 capsule 1  . escitalopram (LEXAPRO) 10 MG tablet Take 10 mg by mouth daily.     . fenofibrate 160 MG tablet Take 160 mg by mouth daily.     . folic acid (FOLVITE) 1 MG tablet Take 1 tablet (1 mg total) by mouth daily. 90 tablet 4  . metoprolol tartrate (LOPRESSOR) 25 MG tablet Take 1 tablet (25 mg total) by mouth 2 (two) times daily as needed (afib). 60 tablet 0  . Red Yeast Rice 600 MG CAPS Take 600 mg by mouth daily.     . rosuvastatin  (CRESTOR) 20 MG tablet Take 20 mg by mouth daily.     . traMADol (ULTRAM) 50 MG tablet Take 1 tablet (50 mg total) by mouth every 6 (six) hours as needed. 30 tablet 0  . vitamin B-12 (CYANOCOBALAMIN) 1000 MCG tablet Take 1 tablet (1,000 mcg total) by mouth daily. 60 tablet 2  . amLODipine (NORVASC) 5 MG tablet Take 1 tablet (5 mg total) by mouth daily. 180 tablet 3   No current facility-administered medications for this visit.    Physical Exam BP 140/85   Pulse 64   Temp 97.6 F (36.4 C) (Skin)   Resp 20   Ht 5\' 4"  (1.626 m)   Wt 140 lb (63.5 kg)   SpO2 98% Comment: RA  BMI 24.98 kg/m  60 year old woman in no acute distress Alert and oriented x3 with no focal deficits Lungs clear with equal breath sounds bilaterally Incisions well-healed Cardiac regular rate and rhythm  Diagnostic Tests: CHEST - 2 VIEW  COMPARISON:  June 18, 2020  FINDINGS: Postoperative change on the right with volume loss. The previously noted hydropneumothorax is no longer evident. There is a small amount of probable loculated effusion on the right. There is atelectatic change in the left lower lobe. No edema or airspace opacity on either side. Heart size and pulmonary vascular normal. No adenopathy. Postoperative change in the upper thoracic region noted.  IMPRESSION: Postoperative change on the right with apparent loculated fluid but no demonstrable hydropneumothorax by radiography. Atelectatic change left base. Lungs otherwise clear. Heart size within normal limits.   Electronically Signed   By: Lowella Grip III M.D.   On: 07/22/2020 09:37 I personally reviewed the chest x-ray images.  Overall good postoperative appearance.  Left lower lobe process appears to be continue to resolve.  Impression: Connie Farrell is a 60 year old woman who had a right upper lobe segmentectomy and right middle lobe wedge resection and node dissection for adenocarcinoma of the lung.  She did have a single  level 13 node that was positive for cancer.  Staging is T4N1, IIIA.  She did well postoperatively and went home on day 3.  She continues to do well at home.  I discussed the pathology results with her and her daughter.  I recommended that she follow-up with Dr. Delton Coombes to discuss possible adjuvant chemotherapy.  I will see any role for radiation in her case.  The left lower lobe process appears to be continuing to resolve based on her chest x-ray.  She will need a CT to follow that up at some point.  There are no restrictions on her activities.  She may drive.  Plan: Return in 6 weeks with PA lateral chest x-ray  [name], MD [facility] [phone]

## 2020-07-29 ENCOUNTER — Inpatient Hospital Stay (HOSPITAL_COMMUNITY): Payer: Commercial Managed Care - PPO | Attending: Hematology | Admitting: Hematology

## 2020-07-29 ENCOUNTER — Other Ambulatory Visit: Payer: Self-pay

## 2020-07-29 VITALS — BP 118/72 | HR 74 | Temp 97.0°F | Resp 18 | Wt 144.3 lb

## 2020-07-29 DIAGNOSIS — Z8261 Family history of arthritis: Secondary | ICD-10-CM | POA: Insufficient documentation

## 2020-07-29 DIAGNOSIS — I10 Essential (primary) hypertension: Secondary | ICD-10-CM | POA: Diagnosis not present

## 2020-07-29 DIAGNOSIS — E611 Iron deficiency: Secondary | ICD-10-CM

## 2020-07-29 DIAGNOSIS — F419 Anxiety disorder, unspecified: Secondary | ICD-10-CM | POA: Insufficient documentation

## 2020-07-29 DIAGNOSIS — Z79899 Other long term (current) drug therapy: Secondary | ICD-10-CM | POA: Diagnosis not present

## 2020-07-29 DIAGNOSIS — K219 Gastro-esophageal reflux disease without esophagitis: Secondary | ICD-10-CM | POA: Insufficient documentation

## 2020-07-29 DIAGNOSIS — Z8249 Family history of ischemic heart disease and other diseases of the circulatory system: Secondary | ICD-10-CM | POA: Diagnosis not present

## 2020-07-29 DIAGNOSIS — E538 Deficiency of other specified B group vitamins: Secondary | ICD-10-CM | POA: Diagnosis not present

## 2020-07-29 DIAGNOSIS — F17210 Nicotine dependence, cigarettes, uncomplicated: Secondary | ICD-10-CM | POA: Insufficient documentation

## 2020-07-29 DIAGNOSIS — E785 Hyperlipidemia, unspecified: Secondary | ICD-10-CM | POA: Insufficient documentation

## 2020-07-29 DIAGNOSIS — C3411 Malignant neoplasm of upper lobe, right bronchus or lung: Secondary | ICD-10-CM | POA: Insufficient documentation

## 2020-07-29 DIAGNOSIS — Z7982 Long term (current) use of aspirin: Secondary | ICD-10-CM | POA: Diagnosis not present

## 2020-07-29 DIAGNOSIS — J449 Chronic obstructive pulmonary disease, unspecified: Secondary | ICD-10-CM | POA: Insufficient documentation

## 2020-07-29 DIAGNOSIS — C3491 Malignant neoplasm of unspecified part of right bronchus or lung: Secondary | ICD-10-CM

## 2020-07-29 MED ORDER — FOLIC ACID 1 MG PO TABS: 1.0000 mg | ORAL_TABLET | Freq: Every day | ORAL | 4 refills | Status: AC

## 2020-07-29 MED ORDER — CYANOCOBALAMIN 1000 MCG/ML IJ SOLN
1000.0000 ug | Freq: Once | INTRAMUSCULAR | Status: AC
  Administered 2020-07-29: 1000 ug via INTRAMUSCULAR
  Filled 2020-07-29: qty 1

## 2020-07-29 NOTE — Progress Notes (Signed)
START ON PATHWAY REGIMEN - Non-Small Cell Lung     A cycle is every 21 days:     Pemetrexed      Carboplatin   **Always confirm dose/schedule in your pharmacy ordering system**  Patient Characteristics: Postoperative without Neoadjuvant Therapy (Pathologic Staging), Stage III, Adjuvant Chemotherapy, Nonsquamous Cell Therapeutic Status: Postoperative without Neoadjuvant Therapy (Pathologic Staging) AJCC T Category: pT4 AJCC N Category: pN1 AJCC M Category: cM0 AJCC 8 Stage Grouping: IIIA Histology: Nonsquamous Cell Intent of Therapy: Curative Intent, Discussed with Patient

## 2020-07-29 NOTE — Patient Instructions (Addendum)
[place] [facility] Discharge Instructions  You were seen today by Dr. Delton Coombes. He went over your recent results. The biopsy results show that your cancer is an adenocarcinoma of the lung. Given the advanced stage of your cancer, further treatment is advised. It can either be in the form of chemotherapy for 4 cycles (consisting of cisplatin and pemetrexed) given every 3 weeks or immunotherapy for 1 year. The side effects of the chemo include feeling fatigued and lowered blood counts. In anticipation of starting treatment, you received a vitamin B12 injection today. You will be scheduled for a CT scan of your chest before your next visit. Dr. Delton Coombes will see you back in 1 week for labs and follow up.   Thank you for choosing [facility] to provide your oncology and hematology care.  To afford each patient quality time with our provider, please arrive at least 15 minutes before your scheduled appointment time.   If you have a lab appointment with the [facility] please come in thru the Main Entrance and check in at the main information desk  You need to re-schedule your appointment should you arrive 10 or more minutes late.  We strive to give you quality time with our providers, and arriving late affects you and other patients whose appointments are after yours.  Also, if you no show three or more times for appointments you may be dismissed from the clinic at the providers discretion.     Again, thank you for choosing [facility].  Our hope is that these requests will decrease the amount of time that you wait before being seen by our physicians.       _____________________________________________________________  Should you have questions after your visit to [facility], please contact our office at [phone] between the hours of 8:00 a.m. and 4:30 p.m.  Voicemails left after 4:00 p.m. will not be  returned until the following business day.  For prescription refill requests, have your pharmacy contact our office and allow 72 hours.    Cancer Center Support Programs:   > Cancer Support Group  2nd Tuesday of the month 1pm-2pm, Journey Room

## 2020-07-29 NOTE — Progress Notes (Signed)
[facility] [address] [place] [id number]   CLINIC:  Medical Oncology/Hematology  PCP:  Connie Squibb, MD [address] Connie Farrell [place] [address] [phone]   REASON FOR VISIT:  Follow-up for right lung adenocarcinoma, IDA & vitamin B12 deficiency  PRIOR THERAPY:  1. Vitamin B12 monthly through 05/22/2019. 2. Robotic-assisted right middle lobe wedge resection and right upper lobe anterior segmentectomy on 06/16/2020.  NGS Results: Not done  CURRENT THERAPY: Intermittent Feraheme last on 12/28/2018; vitamin B12 QD  BRIEF ONCOLOGIC HISTORY:  Oncology History   No history exists.    CANCER STAGING: Cancer Staging No matching staging information was found for the patient.  INTERVAL HISTORY:  [name], a 60 y.o. female, returns for routine follow-up of her right lung adenocarcinoma, IDA and vitamin B12 deficiency. Connie Farrell was last seen on 06/09/2020. She had a robotic-assisted right middle lobe wedge resection and right upper lobe anterior segmentectomy on 06/16/2020 with Connie Farrell.  Today she is accompanied by her daughter and she reports feeling okay. She complains on having posterior rib pains on the left side more than the right, especially worse when she reclines; the pain is similar to the initial pain she had when she initially presented with cancer-related pain. She is worried that the left chest wall pain is perhaps due to cancer being present on the left side. She continues having SOB occasionally. She denies having numbness or tingling in her hands or feet.  She will see Connie Farrell on the middle week of February. She is also wondering if she can be put on disability given her cancer.   REVIEW OF SYSTEMS:  Review of Systems  Constitutional: Positive for appetite change (25%) and fatigue (50%).  Respiratory: Positive for shortness of breath (occasional).   Cardiovascular: Positive for chest pain (L posterior rib wall  pain).  Neurological: Negative for numbness.  All other systems reviewed and are negative.   PAST MEDICAL/SURGICAL HISTORY:  Past Medical History:  Diagnosis Date  . Anxiety   . Arthritis    "hands; spine too" (04/04/2012)  . COPD (chronic obstructive pulmonary disease) ([facility])   . Exertional dyspnea   . Folate deficiency 10/17/2015  . GERD (gastroesophageal reflux disease)   . Hyperlipemia   . Hypertension   . Macrocytosis 10/17/2015  . Neck pain, chronic   . PAD (peripheral artery disease) ([facility])   . Renal artery stenosis ([facility])   . Seasonal allergies   . Tobacco abuse    Past Surgical History:  Procedure Laterality Date  . ABDOMINAL ANGIOGRAM  04/04/2012   Procedure: ABDOMINAL ANGIOGRAM;  Surgeon: Connie Harp, MD;  Location: [facility] CATH LAB;  Service: Cardiovascular;;  . ANTERIOR CERVICAL DECOMP/DISCECTOMY FUSION  ~ 04/2006   C6, 7, T1  . APPENDECTOMY    . COLONOSCOPY N/A 02/26/2014   Adequate preparation. Minimal anal canal hemorrhoids;otherwise, normal rectumdiminutive polyps in the middescending segment; otherwise, the remainder of colonic mucosaappeared normal. Hyperplastic polyps.  . ESOPHAGOGASTRODUODENOSCOPY N/A 02/26/2014   LFY:BOFBPZWC distal esophagus, query short segment Barrett's. Status post Venia Minks dilation with esophageal biopsy. Abnormal gastric mucosa. Esophageal biopsy negative for Barrett's. Gastric biopsy showed mild chronic gastritis, no H pylori.  Dossie Der NERVE BLOCK Right 06/16/2020   Procedure: INTERCOSTAL NERVE BLOCK;  Surgeon: Connie Nakayama, MD;  Location: [facility];  Service: Thoracic;  Laterality: Right;  . LAPAROSCOPIC APPENDECTOMY N/A 09/10/2013   Procedure: APPENDECTOMY LAPAROSCOPIC;  Surgeon: Connie So, MD;  Location: [facility] ORS;  Service: General;  Laterality: N/A;  . LOWER EXTREMITY ANGIOGRAM N/A 04/04/2012   Procedure: LOWER EXTREMITY ANGIOGRAM;  Surgeon: Connie Harp, MD;  Location: [facility] CATH LAB;  Service: Cardiovascular;  Laterality:  N/A;  . Venia Minks DILATION N/A 02/26/2014   Procedure: Venia Minks DILATION;  Surgeon: Connie Dolin, MD;  Location: [facility] ENDO SUITE;  Service: Endoscopy;  Laterality: N/A;  . NODE DISSECTION Right 06/16/2020   Procedure: NODE DISSECTION;  Surgeon: Connie Nakayama, MD;  Location: [facility];  Service: Thoracic;  Laterality: Right;  . PERIPHERAL ARTERIAL STENT GRAFT  04/04/2012  . RENAL ANGIOGRAM Left 04/22/2014   Procedure: RENAL ANGIOGRAM;  Surgeon: Connie Harp, MD;  Location: [facility] CATH LAB;  Service: Cardiovascular;  Laterality: Left;  . Connie Farrell  1988  . VASCULAR SURGERY  03/31/2016   Peripheral arterial stent  . VIDEO BRONCHOSCOPY WITH ENDOBRONCHIAL NAVIGATION N/A 04/28/2020   Procedure: VIDEO BRONCHOSCOPY WITH ENDOBRONCHIAL NAVIGATION;  Surgeon: Connie Nakayama, MD;  Location: [facility];  Service: Thoracic;  Laterality: N/A;  . VIDEO BRONCHOSCOPY WITH ENDOBRONCHIAL NAVIGATION N/A 06/16/2020   Procedure: VIDEO BRONCHOSCOPY WITH ENDOBRONCHIAL NAVIGATION;  Surgeon: Connie Nakayama, MD;  Location: [facility] OR;  Service: Thoracic;  Laterality: N/A;    SOCIAL HISTORY:  Social History   Socioeconomic History  . Marital status: Married    Spouse name: Not on file  . Number of children: Not on file  . Years of education: Not on file  . Highest education level: Not on file  Occupational History  . Occupation: Chiropodist: [employer]    Comment: [address]  Tobacco Use  . Smoking status: Current Every Day Smoker    Packs/day: 1.00    Years: 44.00    Pack years: 44.00    Types: Cigarettes  . Smokeless tobacco: Never Used  Vaping Use  . Vaping Use: Never used  Substance and Sexual Activity  . Alcohol use: No  . Drug use: No  . Sexual activity: Yes  Other Topics Concern  . Not on file  Social History Narrative  . Not on file   Social Determinants of Health   Financial Resource Strain: Not on file  Food Insecurity: Not on file  Transportation Needs:  Not on file  Physical Activity: Not on file  Stress: Not on file  Social Connections: Not on file  Intimate Partner Violence: Not on file    FAMILY HISTORY:  Family History  Problem Relation Age of [place]  . Leukemia Father   . Cancer Father   . Heart disease Father   . Heart disease Mother   . Heart disease Other   . Arthritis Other   . Asthma Other   . Colon cancer Other        unknown, possibly father    CURRENT MEDICATIONS:  Current Outpatient Medications  Medication Sig Dispense Refill  . amLODipine (NORVASC) 5 MG tablet Take 1 tablet (5 mg total) by mouth daily. 180 tablet 3  . aspirin EC 81 MG tablet Take 81 mg by mouth daily.    . Budeson-Glycopyrrol-Formoterol (BREZTRI AEROSPHERE) 160-9-4.8 MCG/ACT AERO Inhale 2 puffs into the lungs in the morning and at bedtime. Take 2 puffs first thing in am and then another 2 puffs about 12 hours later. (Patient taking differently: Inhale 2 puffs into the lungs in the morning and at bedtime.) 10.7 g 11  . cholecalciferol (VITAMIN D3) 25 MCG (1000 UNIT) tablet  Take 1,000 Units by mouth daily.    . clopidogrel (PLAVIX) 75 MG tablet Take 1 tablet (75 mg total) by mouth daily. PLEASE SCHEDULE APPOINTMENT FOR FURTHER REFILLS. 2ND ATTEMPT (Patient taking differently: Take 75 mg by mouth daily.) 15 tablet 0  . cyclobenzaprine (FLEXERIL) 10 MG tablet Take 10 mg by mouth 3 (three) times daily as needed for muscle spasms.    . DEXILANT 60 MG capsule TAKE 1 CAPSULE ONCE DAILY (Patient taking differently: Take 60 mg by mouth daily.) 90 capsule 1  . escitalopram (LEXAPRO) 10 MG tablet Take 10 mg by mouth daily.     . fenofibrate 160 MG tablet Take 160 mg by mouth daily.     . folic acid (FOLVITE) 1 MG tablet Take 1 tablet (1 mg total) by mouth daily. 90 tablet 4  . metoprolol tartrate (LOPRESSOR) 25 MG tablet Take 1 tablet (25 mg total) by mouth 2 (two) times daily as needed (afib). 60 tablet 0  . Red Yeast Rice 600 MG CAPS Take 600 mg by mouth  daily.     . rosuvastatin (CRESTOR) 20 MG tablet Take 20 mg by mouth daily.     . vitamin B-12 (CYANOCOBALAMIN) 1000 MCG tablet Take 1 tablet (1,000 mcg total) by mouth daily. 60 tablet 2  . acetaminophen (TYLENOL) 500 MG tablet Take 500 mg by mouth every 6 (six) hours as needed for moderate pain. (Patient not taking: Reported on 07/29/2020)    . albuterol (VENTOLIN HFA) 108 (90 Base) MCG/ACT inhaler Inhale 1-2 puffs into the lungs every 6 (six) hours as needed for wheezing or shortness of breath. (Patient not taking: Reported on 07/29/2020)     No current facility-administered medications for this visit.    ALLERGIES:  Allergies  Allergen Reactions  . Latex Rash    Blisters  . Codeine Nausea And Vomiting    PHYSICAL EXAM:  Performance status (ECOG): 1 - Symptomatic but completely ambulatory  Vitals:   07/29/20 1400  BP: 118/72  Pulse: 74  Resp: 18  Temp: (!) 97 F (36.1 C)  SpO2: 98%   Wt Readings from Last 3 Encounters:  07/29/20 144 lb 4.8 oz (65.5 kg)  07/22/20 140 lb (63.5 kg)  06/16/20 145 lb 4.5 oz (65.9 kg)   Physical Exam Vitals reviewed.  Constitutional:      Appearance: Normal appearance.  Chest:     Chest wall: Tenderness (TTP on L posterior upper chest wall) present.  Neurological:     General: No focal deficit present.     Mental Status: She is alert and oriented to person, place, and time.  Psychiatric:        Mood and Affect: Mood normal.        Behavior: Behavior normal.      LABORATORY DATA:  I have reviewed the labs as listed.  CBC Latest Ref Rng & Units 06/18/2020 06/17/2020 06/13/2020  WBC 4.0 - 10.5 K/uL 7.4 8.7 5.2  Hemoglobin 12.0 - 15.0 g/dL 12.2 13.4 14.4  Hematocrit 36.0 - 46.0 % 37.6 41.4 44.9  Platelets 150 - 400 K/uL 152 163 166   CMP Latest Ref Rng & Units 06/18/2020 06/17/2020 06/13/2020  Glucose 70 - 99 mg/dL 114(H) 137(H) 101(H)  BUN 6 - 20 mg/dL '8 10 14  ' Creatinine 0.44 - 1.00 mg/dL 0.89 0.93 0.93  Sodium 135 - 145 mmol/L 140  138 138  Potassium 3.5 - 5.1 mmol/L 3.0(L) 3.6 4.3  Chloride 98 - 111 mmol/L 105 103 106  CO2 22 - 32 mmol/L 25 25 21(L)  Calcium 8.9 - 10.3 mg/dL 9.0 8.6(L) 9.6  Total Protein 6.5 - 8.1 g/dL 5.7(L) - 6.6  Total Bilirubin 0.3 - 1.2 mg/dL 0.5 - 1.1  Alkaline Phos 38 - 126 U/L 55 - 69  AST 15 - 41 U/L 31 - 31  ALT 0 - 44 U/L 22 - 18   Surgical pathology ([id number]) on 06/16/2020: Right middle lobe wedge resection: invasive adenocarcinoma, well-differentiated, 0.8 cm. Right upper lobe anterior segmentectomy: invasive adenocarcinoma, moderately differentiated, 1.7 cm.  DIAGNOSTIC IMAGING:  I have independently reviewed the scans and discussed with the patient. DG Chest 2 View  Result Date: 07/22/2020 CLINICAL DATA:  Hypertension. Previous apparent hydropneumothorax. Postoperative lobectomy on the right for lung carcinoma EXAM: CHEST - 2 VIEW COMPARISON:  June 18, 2020 FINDINGS: Postoperative change on the right with volume loss. The previously noted hydropneumothorax is no longer evident. There is a small amount of probable loculated effusion on the right. There is atelectatic change in the left lower lobe. No edema or airspace opacity on either side. Heart size and pulmonary vascular normal. No adenopathy. Postoperative change in the upper thoracic region noted. IMPRESSION: Postoperative change on the right with apparent loculated fluid but no demonstrable hydropneumothorax by radiography. Atelectatic change left base. Lungs otherwise clear. Heart size within normal limits. Electronically Signed   By: Lowella Grip III M.D.   On: 07/22/2020 09:37     ASSESSMENT:  1.Right upper lobe adenocarcinoma: -PET scan on April 14, 2020 shows right upper lobe pulmonary nodule with SUV 5.6. Dense masslike consolidative opacity in the anterior left lower lobe with SUV 11.7. Hypermetabolic nodule seen just medial to this dominant lesion is hypermetabolic but somewhat obscured by breathing  motion. 7 mm short axis paraesophageal lymph node with SUV 4.7. Focal hypermetabolism in the left hilum with SUV 4.0. 13 mm right adrenal adenoma. -Navigational bronchoscopy by Connie Farrell showed non-small cell carcinoma, adenocarcinoma of the right upper lobe nodule. Right middle lobe nodule nondiagnostic. Left lower lobe infiltrate/question mass nondiagnostic. - Right lung wedge resection and segmentectomy on 06/16/2020.  0.8 cm well differentiated invasive adenocarcinoma the right middle lobe.  1.7 cm invasive adenocarcinoma, moderately differentiated in the right upper lobe.  Metastatic carcinoma involving level 13 lymph node.  Total 1/18 lymph nodes involved.  Pathological staging [id number].  Visceral pleura invasion present.  Spread through airspaces present.  Positive lymphovascular invasion.  2.Iron deficiency state: -On intermittent parenteral iron therapy since 2015. -Last Feraheme on December 28, 2018. -Latest CBC shows hemoglobin 14.6. Last ferritin 101 on April 03, 2020. Percent saturation is 13.  3. B12 deficiency: -She received B12 injections on May 22, 2019.   PLAN:  1.  Stage III (PT4PN1A) right lung adenocarcinoma: -We have discussed pathology report in detail. -Given very high risk disease, recommended adjuvant chemotherapy with 4 cycles of platinum based regimen. - Patient is very anxious about receiving chemotherapy.  I have also proposed less toxic regimen with carboplatin and pemetrexed. - We also discussed about sending PD-L1 status to see if she is candidate for 1 year of Atezolizumab after chemotherapy.  We will also send foundation 1 testing. - Discussed side effects of chemotherapy regimen with carboplatin and pemetrexed. - We will proceed with B12 injection today.  We will prescribe folic acid 1 mg tablet daily. - Patient and her daughter will discuss thoroughly and will let us know next week. - I have recommended following up with a CT of the  chest with contrast of the left lower lobe lung infiltrate which has improved with prior course of antibiotics based on scan in November 2021. - We will ask for port placement if she decides to do chemotherapy and is a candidate for 1 year of Atezolizumab.   Orders placed this encounter:  No orders of the defined types were placed in this encounter.  Total time spent is 40 minutes with more than 50% of the time spent face-to-face discussing pathology reports, treatment plan, side effects, counseling and coordination of care.  Derek Jack, MD [place] [phone]   I, Milinda Antis, am acting as a scribe for Dr. Sanda Linger.  I, Derek Jack MD, have reviewed the above documentation for accuracy and completeness, and I agree with the above.

## 2020-07-29 NOTE — Progress Notes (Signed)
Connie Farrell presents today for injection per the provider's orders.  B12 administration without incident; injection site WNL; see MAR for injection details.  Patient tolerated procedure well and without incident.  No questions or complaints noted at this time. Pt discharged ambulatory in satisfactory condition accompanied by daughter.

## 2020-07-29 NOTE — Progress Notes (Signed)
Patient assessed and labs 06/18/2020 reviewed by Dr. Delton Coombes. Order for Vitamin B12 injection today placed per Dr. Raliegh Ip and Rx sent for folic acid 1mg .

## 2020-08-01 ENCOUNTER — Ambulatory Visit (HOSPITAL_COMMUNITY)
Admission: RE | Admit: 2020-08-01 | Discharge: 2020-08-01 | Disposition: A | Discharge status: Home / Self Care | Payer: Commercial Managed Care - PPO | Source: Ambulatory Visit | Attending: Hematology | Admitting: Hematology

## 2020-08-01 ENCOUNTER — Other Ambulatory Visit: Payer: Self-pay

## 2020-08-01 DIAGNOSIS — C3491 Malignant neoplasm of unspecified part of right bronchus or lung: Secondary | ICD-10-CM | POA: Diagnosis not present

## 2020-08-01 DIAGNOSIS — E611 Iron deficiency: Secondary | ICD-10-CM | POA: Diagnosis present

## 2020-08-01 DIAGNOSIS — E538 Deficiency of other specified B group vitamins: Secondary | ICD-10-CM

## 2020-08-01 MED ORDER — IOHEXOL 300 MG/ML  SOLN
75.0000 mL | Freq: Once | INTRAMUSCULAR | Status: AC | PRN
  Administered 2020-08-01: 75 mL via INTRAVENOUS

## 2020-08-02 LAB — POCT I-STAT CREATININE: Creatinine, Ser: 1.2 mg/dL — ABNORMAL HIGH (ref 0.44–1.00)

## 2020-08-06 ENCOUNTER — Other Ambulatory Visit (HOSPITAL_COMMUNITY): Payer: Self-pay

## 2020-08-06 DIAGNOSIS — C3491 Malignant neoplasm of unspecified part of right bronchus or lung: Secondary | ICD-10-CM

## 2020-08-07 ENCOUNTER — Inpatient Hospital Stay (HOSPITAL_COMMUNITY): Payer: Commercial Managed Care - PPO

## 2020-08-07 ENCOUNTER — Encounter (HOSPITAL_COMMUNITY): Payer: Self-pay | Admitting: Hematology

## 2020-08-07 ENCOUNTER — Other Ambulatory Visit: Payer: Self-pay

## 2020-08-07 ENCOUNTER — Inpatient Hospital Stay (HOSPITAL_COMMUNITY): Payer: Commercial Managed Care - PPO | Admitting: Hematology

## 2020-08-07 VITALS — BP 131/79 | HR 65 | Temp 96.5°F | Resp 20 | Wt 142.7 lb

## 2020-08-07 DIAGNOSIS — E611 Iron deficiency: Secondary | ICD-10-CM

## 2020-08-07 DIAGNOSIS — C3411 Malignant neoplasm of upper lobe, right bronchus or lung: Secondary | ICD-10-CM | POA: Diagnosis not present

## 2020-08-07 DIAGNOSIS — C3491 Malignant neoplasm of unspecified part of right bronchus or lung: Secondary | ICD-10-CM | POA: Diagnosis not present

## 2020-08-07 DIAGNOSIS — E538 Deficiency of other specified B group vitamins: Secondary | ICD-10-CM

## 2020-08-07 LAB — CBC WITH DIFFERENTIAL/PLATELET
Abs Immature Granulocytes: 0.03 10*3/uL (ref 0.00–0.07)
Basophils Absolute: 0.1 10*3/uL (ref 0.0–0.1)
Basophils Relative: 1 %
Eosinophils Absolute: 0.2 10*3/uL (ref 0.0–0.5)
Eosinophils Relative: 3 %
HCT: 42.5 % (ref 36.0–46.0)
Hemoglobin: 13.8 g/dL (ref 12.0–15.0)
Immature Granulocytes: 1 %
Lymphocytes Relative: 25 %
Lymphs Abs: 1.5 10*3/uL (ref 0.7–4.0)
MCH: 31.2 pg (ref 26.0–34.0)
MCHC: 32.5 g/dL (ref 30.0–36.0)
MCV: 96.2 fL (ref 80.0–100.0)
Monocytes Absolute: 0.3 10*3/uL (ref 0.1–1.0)
Monocytes Relative: 5 %
Neutro Abs: 4 10*3/uL (ref 1.7–7.7)
Neutrophils Relative %: 65 %
Platelets: 213 10*3/uL (ref 150–400)
RBC: 4.42 MIL/uL (ref 3.87–5.11)
RDW: 15.5 % (ref 11.5–15.5)
WBC: 6.1 10*3/uL (ref 4.0–10.5)
nRBC: 0 % (ref 0.0–0.2)

## 2020-08-07 LAB — COMPREHENSIVE METABOLIC PANEL
ALT: 26 U/L (ref 0–44)
AST: 24 U/L (ref 15–41)
Albumin: 4.2 g/dL (ref 3.5–5.0)
Alkaline Phosphatase: 62 U/L (ref 38–126)
Anion gap: 7 (ref 5–15)
BUN: 23 mg/dL — ABNORMAL HIGH (ref 6–20)
CO2: 25 mmol/L (ref 22–32)
Calcium: 9.9 mg/dL (ref 8.9–10.3)
Chloride: 101 mmol/L (ref 98–111)
Creatinine, Ser: 1.13 mg/dL — ABNORMAL HIGH (ref 0.44–1.00)
GFR, Estimated: 57 mL/min — ABNORMAL LOW (ref >60)
Glucose, Bld: 99 mg/dL (ref 70–99)
Potassium: 4.8 mmol/L (ref 3.5–5.1)
Sodium: 133 mmol/L — ABNORMAL LOW (ref 135–145)
Total Bilirubin: 0.4 mg/dL (ref 0.3–1.2)
Total Protein: 7.7 g/dL (ref 6.5–8.1)

## 2020-08-07 LAB — IRON AND TIBC
Iron: 80 ug/dL (ref 28–170)
Saturation Ratios: 16 % (ref 10.4–31.8)
TIBC: 497 ug/dL — ABNORMAL HIGH (ref 250–450)
UIBC: 417 ug/dL

## 2020-08-07 LAB — VITAMIN B12: Vitamin B-12: 700 pg/mL (ref 180–914)

## 2020-08-07 LAB — FERRITIN: Ferritin: 181 ng/mL (ref 11–307)

## 2020-08-07 MED ORDER — CYCLOBENZAPRINE HCL 10 MG PO TABS
10.0000 mg | ORAL_TABLET | Freq: Three times a day (TID) | ORAL | 1 refills | Status: DC | PRN
Start: 2020-08-07 — End: 2022-11-02

## 2020-08-07 NOTE — Progress Notes (Signed)
[place] [address], [place] [address]   CLINIC:  Medical Oncology/Hematology  PCP:  Connie Squibb, MD [address] Liana Crocker [employer] [place] [phone]   REASON FOR VISIT:  Follow-up for right lung adenocarcinoma, IDA & vitamin B12 deficiency  PRIOR THERAPY:  1. Vitamin B12 monthly through 05/22/2019. 2. Robotic-assisted right middle lobe wedge resection and right upper lobe anterior segmentectomy on 06/16/2020.  NGS Results: Not done  CURRENT THERAPY: To begin carboplatin and pemetrexed every 3 weeks; intermittent Feraheme last on 12/28/2018; vitamin B12 QD  BRIEF ONCOLOGIC HISTORY:  Oncology History  Non-small cell carcinoma of lung, stage 3, right ([place])  07/29/2020 Initial Diagnosis   Non-small cell carcinoma of lung, stage 3, right ([place])   07/29/2020 Cancer Staging   Staging form: Lung, AJCC 8th Edition - Clinical: Stage IIIA (cT4, cN1, cM0) - Signed by Derek Jack, MD on 07/29/2020   08/05/2020 -  Chemotherapy    Patient is on Treatment Plan: LUNG NSCLC PEMETREXED (ALIMTA) / CARBOPLATIN Q21D X 4 CYCLES        CANCER STAGING: Cancer Staging Non-small cell carcinoma of lung, stage 3, right ([place]) Staging form: Lung, AJCC 8th Edition - Clinical: Stage IIIA (cT4, cN1, cM0) - Signed by Derek Jack, MD on 07/29/2020   INTERVAL HISTORY:  Connie Farrell, a 60 y.o. female, returns for routine follow-up of her right lung adenocarcinoma, IDA and vitamin B12 deficiency. Connie Farrell was last seen on 07/29/2020.   Today she is accompanied by her husband and daughter and she reports feeling okay. She continues having pain on her left posterior chest and back where she had her incisions; she takes a Flexeril and uses a heating pad which helps ease the spasm. She is taking folic acid daily.   REVIEW OF SYSTEMS:  Review of Systems  Constitutional: Positive for appetite change (50%) and fatigue (50%).  Cardiovascular: Positive for  chest pain (L lower posterior chest wall pain).  All other systems reviewed and are negative.   PAST MEDICAL/SURGICAL HISTORY:  Past Medical History:  Diagnosis Date  . Anxiety   . Arthritis    "hands; spine too" (04/04/2012)  . COPD (chronic obstructive pulmonary disease) ([place])   . Exertional dyspnea   . Folate deficiency 10/17/2015  . GERD (gastroesophageal reflux disease)   . Hyperlipemia   . Hypertension   . Macrocytosis 10/17/2015  . Neck pain, chronic   . PAD (peripheral artery disease) ([place])   . Renal artery stenosis ([place])   . Seasonal allergies   . Tobacco abuse    Past Surgical History:  Procedure Laterality Date  . ABDOMINAL ANGIOGRAM  04/04/2012   Procedure: ABDOMINAL ANGIOGRAM;  Surgeon: Lorretta Harp, MD;  Location: [facility] CATH LAB;  Service: Cardiovascular;;  . ANTERIOR CERVICAL DECOMP/DISCECTOMY FUSION  ~ 04/2006   C6, 7, T1  . APPENDECTOMY    . COLONOSCOPY N/A 02/26/2014   Adequate preparation. Minimal anal canal hemorrhoids;otherwise, normal rectumdiminutive polyps in the middescending segment; otherwise, the remainder of colonic mucosaappeared normal. Hyperplastic polyps.  . ESOPHAGOGASTRODUODENOSCOPY N/A 02/26/2014   WIO:MBTDHRCB distal esophagus, query short segment Barrett's. Status post Venia Minks dilation with esophageal biopsy. Abnormal gastric mucosa. Esophageal biopsy negative for Barrett's. Gastric biopsy showed mild chronic gastritis, no H pylori.  Dossie Der NERVE BLOCK Right 06/16/2020   Procedure: INTERCOSTAL NERVE BLOCK;  Surgeon: [name], MD;  Location: [place];  Service: Thoracic;  Laterality: Right;  . LAPAROSCOPIC APPENDECTOMY N/A 09/10/2013  Procedure: APPENDECTOMY LAPAROSCOPIC;  Surgeon: Jamesetta So, MD;  Location: [facility] ORS;  Service: General;  Laterality: N/A;  . LOWER EXTREMITY ANGIOGRAM N/A 04/04/2012   Procedure: LOWER EXTREMITY ANGIOGRAM;  Surgeon: Lorretta Harp, MD;  Location: [facility] CATH LAB;  Service: Cardiovascular;   Laterality: N/A;  . Venia Minks DILATION N/A 02/26/2014   Procedure: Venia Minks DILATION;  Surgeon: Daneil Dolin, MD;  Location: [facility] ENDO SUITE;  Service: Endoscopy;  Laterality: N/A;  . NODE DISSECTION Right 06/16/2020   Procedure: NODE DISSECTION;  Surgeon: [name], MD;  Location: [place];  Service: Thoracic;  Laterality: Right;  . PERIPHERAL ARTERIAL STENT GRAFT  04/04/2012  . RENAL ANGIOGRAM Left 04/22/2014   Procedure: RENAL ANGIOGRAM;  Surgeon: Lorretta Harp, MD;  Location: [facility] CATH LAB;  Service: Cardiovascular;  Laterality: Left;  . TUBAL LIGATION  1990  . VASCULAR SURGERY  03/31/2016   Peripheral arterial stent  . VIDEO BRONCHOSCOPY WITH ENDOBRONCHIAL NAVIGATION N/A 04/28/2020   Procedure: VIDEO BRONCHOSCOPY WITH ENDOBRONCHIAL NAVIGATION;  Surgeon: [name], MD;  Location: [place];  Service: Thoracic;  Laterality: N/A;  . VIDEO BRONCHOSCOPY WITH ENDOBRONCHIAL NAVIGATION N/A 06/16/2020   Procedure: VIDEO BRONCHOSCOPY WITH ENDOBRONCHIAL NAVIGATION;  Surgeon: [name], MD;  Location: [facility] OR;  Service: Thoracic;  Laterality: N/A;    SOCIAL HISTORY:  Social History   Socioeconomic History  . Marital status: Married    Spouse name: Not on file  . Number of children: Not on file  . Years of education: Not on file  . Highest education level: Not on file  Occupational History  . Occupation: Chiropodist: [employer]    Comment: [address]  Tobacco Use  . Smoking status: Current Every Day Smoker    Packs/day: 1.00    Years: 44.00    Pack years: 44.00    Types: Cigarettes  . Smokeless tobacco: Never Used  Vaping Use  . Vaping Use: Never used  Substance and Sexual Activity  . Alcohol use: No  . Drug use: No  . Sexual activity: Yes  Other Topics Concern  . Not on file  Social History Narrative  . Not on file   Social Determinants of Health   Financial Resource Strain: Not on file  Food Insecurity: Not on file   Transportation Needs: Not on file  Physical Activity: Not on file  Stress: Not on file  Social Connections: Not on file  Intimate Partner Violence: Not on file    FAMILY HISTORY:  Family History  Problem Relation Age of Onset  . Leukemia Father   . Cancer Father   . Heart disease Father   . Heart disease Mother   . Heart disease Other   . Arthritis Other   . Asthma Other   . Colon cancer Other        unknown, possibly father    CURRENT MEDICATIONS:  Current Outpatient Medications  Medication Sig Dispense Refill  . acetaminophen (TYLENOL) 500 MG tablet Take 500 mg by mouth every 6 (six) hours as needed for moderate pain. (Patient not taking: Reported on 07/29/2020)    . albuterol (VENTOLIN HFA) 108 (90 Base) MCG/ACT inhaler Inhale 1-2 puffs into the lungs every 6 (six) hours as needed for wheezing or shortness of breath. (Patient not taking: Reported on 07/29/2020)    . amLODipine (NORVASC) 5 MG tablet Take 1 tablet (5 mg total) by mouth daily. 180 tablet 3  . aspirin EC  81 MG tablet Take 81 mg by mouth daily.    . Budeson-Glycopyrrol-Formoterol (BREZTRI AEROSPHERE) 160-9-4.8 MCG/ACT AERO Inhale 2 puffs into the lungs in the morning and at bedtime. Take 2 puffs first thing in am and then another 2 puffs about 12 hours later. (Patient taking differently: Inhale 2 puffs into the lungs in the morning and at bedtime.) 10.7 g 11  . cholecalciferol (VITAMIN D3) 25 MCG (1000 UNIT) tablet Take 1,000 Units by mouth daily.    . clopidogrel (PLAVIX) 75 MG tablet Take 1 tablet (75 mg total) by mouth daily. PLEASE SCHEDULE APPOINTMENT FOR FURTHER REFILLS. 2ND ATTEMPT (Patient taking differently: Take 75 mg by mouth daily.) 15 tablet 0  . cyclobenzaprine (FLEXERIL) 10 MG tablet Take 10 mg by mouth 3 (three) times daily as needed for muscle spasms.    . DEXILANT 60 MG capsule TAKE 1 CAPSULE ONCE DAILY (Patient taking differently: Take 60 mg by mouth daily.) 90 capsule 1  . escitalopram (LEXAPRO) 10  MG tablet Take 10 mg by mouth daily.     . fenofibrate 160 MG tablet Take 160 mg by mouth daily.     . folic acid (FOLVITE) 1 MG tablet Take 1 tablet (1 mg total) by mouth daily. 90 tablet 4  . folic acid (FOLVITE) 1 MG tablet Take 1 tablet (1 mg total) by mouth daily. 30 tablet 4  . metoprolol tartrate (LOPRESSOR) 25 MG tablet Take 1 tablet (25 mg total) by mouth 2 (two) times daily as needed (afib). 60 tablet 0  . Red Yeast Rice 600 MG CAPS Take 600 mg by mouth daily.     . rosuvastatin (CRESTOR) 20 MG tablet Take 20 mg by mouth daily.     . vitamin B-12 (CYANOCOBALAMIN) 1000 MCG tablet Take 1 tablet (1,000 mcg total) by mouth daily. 60 tablet 2   No current facility-administered medications for this visit.    ALLERGIES:  Allergies  Allergen Reactions  . Latex Rash    Blisters  . Codeine Nausea And Vomiting    PHYSICAL EXAM:  Performance status (ECOG): 1 - Symptomatic but completely ambulatory  Vitals:   08/07/20 1414  BP: 131/79  Pulse: 65  Resp: 20  Temp: (!) 96.5 F (35.8 C)  SpO2: 100%   Wt Readings from Last 3 Encounters:  08/07/20 142 lb 11.2 oz (64.7 kg)  07/29/20 144 lb 4.8 oz (65.5 kg)  07/22/20 140 lb (63.5 kg)   Physical Exam Vitals reviewed.  Constitutional:      Appearance: Normal appearance.  Neurological:     General: No focal deficit present.     Mental Status: She is alert and oriented to person, place, and time.  Psychiatric:        Mood and Affect: Mood normal.        Behavior: Behavior normal.      LABORATORY DATA:  I have reviewed the labs as listed.  CBC Latest Ref Rng & Units 08/07/2020 06/18/2020 06/17/2020  WBC 4.0 - 10.5 K/uL 6.1 7.4 8.7  Hemoglobin 12.0 - 15.0 g/dL 13.8 12.2 13.4  Hematocrit 36.0 - 46.0 % 42.5 37.6 41.4  Platelets 150 - 400 K/uL 213 152 163   CMP Latest Ref Rng & Units 08/07/2020 08/01/2020 06/18/2020  Glucose 70 - 99 mg/dL 99 - 114(H)  BUN 6 - 20 mg/dL 23(H) - 8  Creatinine 0.44 - 1.00 mg/dL 1.13(H) 1.20(H) 0.89   Sodium 135 - 145 mmol/L 133(L) - 140  Potassium 3.5 - 5.1 mmol/L  4.8 - 3.0(L)  Chloride 98 - 111 mmol/L 101 - 105  CO2 22 - 32 mmol/L 25 - 25  Calcium 8.9 - 10.3 mg/dL 9.9 - 9.0  Total Protein 6.5 - 8.1 g/dL 7.7 - 5.7(L)  Total Bilirubin 0.3 - 1.2 mg/dL 0.4 - 0.5  Alkaline Phos 38 - 126 U/L 62 - 55  AST 15 - 41 U/L 24 - 31  ALT 0 - 44 U/L 26 - 22   Lab Results  Component Value Date   TIBC 497 (H) 08/07/2020   TIBC 442 04/03/2020   TIBC 384 10/01/2019   FERRITIN 181 08/07/2020   FERRITIN 101 04/03/2020   FERRITIN 121 10/01/2019   IRONPCTSAT 16 08/07/2020   IRONPCTSAT 13 04/03/2020   IRONPCTSAT 20 10/01/2019    DIAGNOSTIC IMAGING:  I have independently reviewed the scans and discussed with the patient. DG Chest 2 View  Result Date: 07/22/2020 CLINICAL DATA:  Hypertension. Previous apparent hydropneumothorax. Postoperative lobectomy on the right for lung carcinoma EXAM: CHEST - 2 VIEW COMPARISON:  June 18, 2020 FINDINGS: Postoperative change on the right with volume loss. The previously noted hydropneumothorax is no longer evident. There is a small amount of probable loculated effusion on the right. There is atelectatic change in the left lower lobe. No edema or airspace opacity on either side. Heart size and pulmonary vascular normal. No adenopathy. Postoperative change in the upper thoracic region noted. IMPRESSION: Postoperative change on the right with apparent loculated fluid but no demonstrable hydropneumothorax by radiography. Atelectatic change left base. Lungs otherwise clear. Heart size within normal limits. Electronically Signed   By: Lowella Grip III M.D.   On: 07/22/2020 09:37   CT Chest W Contrast  Result Date: 08/01/2020 CLINICAL DATA:  Adenocarcinoma of the right lung. Status post right middle lobe wedge resection and right upper lobe anterior segmentectomy. Restaging. EXAM: CT CHEST WITH CONTRAST TECHNIQUE: Multidetector CT imaging of the chest was performed  during intravenous contrast administration. CONTRAST:  21m OMNIPAQUE IOHEXOL 300 MG/ML  SOLN COMPARISON:  Left 1621 FINDINGS: Cardiovascular: The heart size is normal. No substantial pericardial effusion. Coronary artery calcification is evident. Atherosclerotic calcification is noted in the wall of the thoracic aorta. Mediastinum/Nodes: Scattered small mediastinal lymph nodes again noted. Soft tissue fullness in the right hilum and parahilar lung noted, presumably postsurgical. 10 mm short axis inferior left hilar node was not definitely present on the prior noncontrast exam. There is no axillary lymphadenopathy. Lungs/Pleura: Nodular band like opacity in the parahilar right middle lobe is presumably postsurgical on this first postoperative follow-up study. 4 mm posterior right upper lobe nodule on 38/4 may be the 4 mm posterior nodule seen on 30/2/8 previously but reposition due to volume loss/distortion from surgery. Consolidative opacity anterior left lower lobe along the major fissure measures 2.6 x 1.2 cm today (87/4) compared to 3.1 x 1.5 cm previously. 7 mm nodular density anterior to the left major fissure on 79/4 is also similar to prior. No pleural effusion. Upper Abdomen: Bilateral adrenal nodules are stable comparing to PET-CT 04/14/2020 a cannot be definitively characterized on today's exam. Musculoskeletal: No worrisome lytic or sclerotic osseous abnormality. IMPRESSION: 1. Interval resection of the right upper and middle lobe pulmonary lesions. This serves as the first postoperative baseline. No definite findings of recurrent or metastatic disease. 2. 10 mm short axis inferior left hilar node was not readily evident on previous noncontrast CT but represents the lymph node showing hypermetabolic FDG uptake on PET-CT of 04/14/2020. Close attention  on follow-up recommended. 3. Dense anterior left lower lobe consolidative opacity is similar to minimally decreased in the interval. 4. Additional scattered  bilateral pulmonary nodules are stable. 5. Bilateral adrenal nodules are stable comparing to PET-CT 04/14/2020. Continued attention on follow-up recommended. 6. Aortic Atherosclerosis (ICD10-I70.0). Electronically Signed   By: Misty Stanley M.D.   On: 08/01/2020 15:22     ASSESSMENT:  1.Right upper lobe adenocarcinoma: -PET scan on April 14, 2020 shows right upper lobe pulmonary nodule with SUV 5.6. Dense masslike consolidative opacity in the anterior left lower lobe with SUV 11.7. Hypermetabolic nodule seen just medial to this dominant lesion is hypermetabolic but somewhat obscured by breathing motion. 7 mm short axis paraesophageal lymph node with SUV 4.7. Focal hypermetabolism in the left hilum with SUV 4.0. 13 mm right adrenal adenoma. -Navigational bronchoscopy by [name] showed non-small cell carcinoma, adenocarcinoma of the right upper lobe nodule. Right middle lobe nodule nondiagnostic. Left lower lobe infiltrate/question mass nondiagnostic. - Right lung wedge resection and segmentectomy on 06/16/2020.  0.8 cm well differentiated invasive adenocarcinoma the right middle lobe.  1.7 cm invasive adenocarcinoma, moderately differentiated in the right upper lobe.  Metastatic carcinoma involving level 13 lymph node.  Total 1/18 lymph nodes involved.  Pathological staging PT4PN1A.  Visceral pleura invasion present.  Spread through airspaces present.  Positive lymphovascular invasion. - CT chest with contrast on 08/01/2020 shows interval resection of the right upper lobe middle lobe primary lesions.  10 mm inferior left hilar lymph node was not readily evident on noncontrast CT from November.  However this was seen on PET scan from 04/14/2020.  Dense anterior left lower lobe consolidative opacity is slightly decreased and measures 2.6 x 1.2 cm.  2.Iron deficiency state: -On intermittent parenteral iron therapy since 2015. -Last Feraheme on December 28, 2018. -Latest CBC shows  hemoglobin 14.6. Last ferritin 101 on April 03, 2020. Percent saturation is 13.  3. B12 deficiency: -She received B12 injections on May 22, 2019.   PLAN:  1.  Stage III (PT4PN1A) right lung adenocarcinoma: -I have discussed reports and images of the CT scan with the patient, her daughter and her husband. - Recommended adjuvant chemotherapy with carboplatin and pemetrexed as she is not a cisplatin candidate. - She already received a B12 injection.  She is taking folic acid daily. - Given her high risk of recurrence, I have strongly recommended adjuvant chemotherapy.  Husband and her would like to think and let us know in the next few days about the addition of chemotherapy. - We have also sent foundation 1 and PD-L1 test results. - If PD-L1 is positive, she will also be a candidate for 1 year of adjuvant Atezolizumab. - I plan to see her back in 2 weeks to discuss results.   Orders placed this encounter:  No orders of the defined types were placed in this encounter.  Total time spent is 30 minutes with more than 50% of the time spent face-to-face discussing scan results, treatment recommendations, counseling and coordination of care.  Derek Jack, MD [place] [phone]   I, Milinda Antis, am acting as a scribe for Dr. Sanda Linger.  I, Derek Jack MD, have reviewed the above documentation for accuracy and completeness, and I agree with the above.

## 2020-08-07 NOTE — Patient Instructions (Signed)
[place] at [facility] Discharge Instructions  You were seen today by Dr. Delton Coombes. He went over your recent results and scans. You will be prescribed Flexeril 10 mg to take daily for your muscle spasm. Continue taking folic acid daily. Dr. Delton Coombes will see you back in 2 weeks for follow up.   Thank you for choosing [place] at [facility] to provide your oncology and hematology care.  To afford each patient quality time with our provider, please arrive at least 15 minutes before your scheduled appointment time.   If you have a lab appointment with the [facility] please come in thru the Main Entrance and check in at the main information desk  You need to re-schedule your appointment should you arrive 10 or more minutes late.  We strive to give you quality time with our providers, and arriving late affects you and other patients whose appointments are after yours.  Also, if you no show three or more times for appointments you may be dismissed from the clinic at the providers discretion.     Again, thank you for choosing [facility].  Our hope is that these requests will decrease the amount of time that you wait before being seen by our physicians.       _____________________________________________________________  Should you have questions after your visit to [facility], please contact our office at [phone] between the hours of 8:00 a.m. and 4:30 p.m.  Voicemails left after 4:00 p.m. will not be returned until the following business day.  For prescription refill requests, have your pharmacy contact our office and allow 72 hours.    Cancer Center Support Programs:   > Cancer Support Group  2nd Tuesday of the month 1pm-2pm, Journey Room

## 2020-08-20 ENCOUNTER — Encounter (HOSPITAL_COMMUNITY): Payer: Self-pay

## 2020-08-25 ENCOUNTER — Encounter (HOSPITAL_COMMUNITY): Payer: Self-pay

## 2020-08-26 ENCOUNTER — Encounter (HOSPITAL_COMMUNITY): Payer: Self-pay | Admitting: Hematology

## 2020-08-26 ENCOUNTER — Other Ambulatory Visit: Payer: Self-pay

## 2020-08-26 ENCOUNTER — Inpatient Hospital Stay (HOSPITAL_COMMUNITY): Payer: Commercial Managed Care - PPO | Attending: Hematology | Admitting: Hematology

## 2020-08-26 VITALS — BP 129/80 | HR 79 | Temp 97.8°F | Resp 18 | Wt 141.3 lb

## 2020-08-26 DIAGNOSIS — R110 Nausea: Secondary | ICD-10-CM | POA: Diagnosis not present

## 2020-08-26 DIAGNOSIS — R112 Nausea with vomiting, unspecified: Secondary | ICD-10-CM | POA: Diagnosis not present

## 2020-08-26 DIAGNOSIS — R059 Cough, unspecified: Secondary | ICD-10-CM | POA: Insufficient documentation

## 2020-08-26 DIAGNOSIS — C3491 Malignant neoplasm of unspecified part of right bronchus or lung: Secondary | ICD-10-CM

## 2020-08-26 DIAGNOSIS — E611 Iron deficiency: Secondary | ICD-10-CM

## 2020-08-26 DIAGNOSIS — Z7982 Long term (current) use of aspirin: Secondary | ICD-10-CM | POA: Insufficient documentation

## 2020-08-26 DIAGNOSIS — R197 Diarrhea, unspecified: Secondary | ICD-10-CM | POA: Diagnosis not present

## 2020-08-26 DIAGNOSIS — C3411 Malignant neoplasm of upper lobe, right bronchus or lung: Secondary | ICD-10-CM | POA: Diagnosis not present

## 2020-08-26 DIAGNOSIS — Z902 Acquired absence of lung [part of]: Secondary | ICD-10-CM | POA: Diagnosis not present

## 2020-08-26 DIAGNOSIS — Z9221 Personal history of antineoplastic chemotherapy: Secondary | ICD-10-CM | POA: Insufficient documentation

## 2020-08-26 DIAGNOSIS — Z79899 Other long term (current) drug therapy: Secondary | ICD-10-CM | POA: Insufficient documentation

## 2020-08-26 DIAGNOSIS — R5383 Other fatigue: Secondary | ICD-10-CM | POA: Insufficient documentation

## 2020-08-26 DIAGNOSIS — N289 Disorder of kidney and ureter, unspecified: Secondary | ICD-10-CM | POA: Insufficient documentation

## 2020-08-26 DIAGNOSIS — F17210 Nicotine dependence, cigarettes, uncomplicated: Secondary | ICD-10-CM | POA: Diagnosis not present

## 2020-08-26 DIAGNOSIS — E538 Deficiency of other specified B group vitamins: Secondary | ICD-10-CM | POA: Diagnosis not present

## 2020-08-26 MED ORDER — PROCHLORPERAZINE MALEATE 10 MG PO TABS: 10.0000 mg | ORAL_TABLET | Freq: Four times a day (QID) | ORAL | 3 refills | Status: DC | PRN

## 2020-08-26 NOTE — Patient Instructions (Signed)
[place] at [facility] Discharge Instructions  You were seen today by Dr. Delton Coombes. He went over your recent results. In lieu of doing chemo, your cancer will be tracked with scans and blood work. You will be scheduled for a CT scan of your chest before your next visit. You will be prescribed Compazine 10 mg to help control your nausea; take it before eating so that you can eat your meals. Dr. Delton Coombes will see you back in 3 months for labs and follow up.   Thank you for choosing [place] at [facility] to provide your oncology and hematology care.  To afford each patient quality time with our provider, please arrive at least 15 minutes before your scheduled appointment time.   If you have a lab appointment with the [facility] please come in thru the Main Entrance and check in at the main information desk  You need to re-schedule your appointment should you arrive 10 or more minutes late.  We strive to give you quality time with our providers, and arriving late affects you and other patients whose appointments are after yours.  Also, if you no show three or more times for appointments you may be dismissed from the clinic at the providers discretion.     Again, thank you for choosing [facility].  Our hope is that these requests will decrease the amount of time that you wait before being seen by our physicians.       _____________________________________________________________  Should you have questions after your visit to [facility], please contact our office at [phone] between the hours of 8:00 a.m. and 4:30 p.m.  Voicemails left after 4:00 p.m. will not be returned until the following business day.  For prescription refill requests, have your pharmacy contact our office and allow 72 hours.    Cancer Center Support Programs:   > Cancer Support Group  2nd Tuesday of the month 1pm-2pm, Journey Room

## 2020-08-26 NOTE — Progress Notes (Signed)
[facility] [address] [place] [address]   CLINIC:  Medical Oncology/Hematology  PCP:  Celene Squibb, MD [address] [place] [address] [phone]   REASON FOR VISIT:  Follow-up for right lung adenocarcinoma  PRIOR THERAPY:  1. Vitamin B12 monthly through 05/22/2019. 2. Robotic-assisted right middle lobe wedge resection and right upper lobe anterior segmentectomy on 06/16/2020.  NGS Results: Foundation 1 MS--stable, TMB 19 Muts/Mb, PD-L1 TPS 10%  CURRENT THERAPY: To begin carboplatin and pemetrexed every 3 weeks  BRIEF ONCOLOGIC HISTORY:  Oncology History  Non-small cell carcinoma of lung, stage 3, right ([place])  07/29/2020 Initial Diagnosis   Non-small cell carcinoma of lung, stage 3, right ([place])   07/29/2020 Cancer Staging   Staging form: Lung, AJCC 8th Edition - Clinical: Stage IIIA (cT4, cN1, cM0) - Signed by Derek Jack, MD on 07/29/2020   08/05/2020 -  Chemotherapy    Patient is on Treatment Plan: LUNG NSCLC PEMETREXED (ALIMTA) / CARBOPLATIN Q21D X 4 CYCLES      08/19/2020 [facility] Results:     08/22/2020 Genetic Testing   PD-L1:       CANCER STAGING: Cancer Staging Non-small cell carcinoma of lung, stage 3, right ([place]) Staging form: Lung, AJCC 8th Edition - Clinical: Stage IIIA (cT4, cN1, cM0) - Signed by Derek Jack, MD on 07/29/2020   INTERVAL HISTORY:  [name], a 60 y.o. female, returns for routine follow-up of her right lung adenocarcinoma. Kammi was last seen on 08/07/2020.   Today she is accompanied by her daughter and she reports feeling fair. She continues taking folic acid. She complains of having N/V/D, along with chest pressure and numbness in her arms. She is nauseated almost daily and her vomitus generally consist of partially digested food along with sputum. Her appetite has also suffered due to her nausea and vomiting.  She is not interested in pursuing  chemo at this point. She has quit smoking.   REVIEW OF SYSTEMS:  Review of Systems  Constitutional: Positive for appetite change (50%) and fatigue (25%).  Respiratory: Positive for cough.   Cardiovascular: Positive for chest pain (pressure).  Gastrointestinal: Positive for diarrhea, nausea and vomiting.  Neurological: Positive for numbness (arms).  All other systems reviewed and are negative.   PAST MEDICAL/SURGICAL HISTORY:  Past Medical History:  Diagnosis Date  . Anxiety   . Arthritis    "hands; spine too" (04/04/2012)  . COPD (chronic obstructive pulmonary disease) ([place])   . Exertional dyspnea   . Folate deficiency 10/17/2015  . GERD (gastroesophageal reflux disease)   . Hyperlipemia   . Hypertension   . Macrocytosis 10/17/2015  . Neck pain, chronic   . PAD (peripheral artery disease) ([place])   . Renal artery stenosis ([place])   . Seasonal allergies   . Tobacco abuse    Past Surgical History:  Procedure Laterality Date  . ABDOMINAL ANGIOGRAM  04/04/2012   Procedure: ABDOMINAL ANGIOGRAM;  Surgeon: Lorretta Harp, MD;  Location: [facility] CATH LAB;  Service: Cardiovascular;;  . ANTERIOR CERVICAL DECOMP/DISCECTOMY FUSION  ~ 04/2006   C6, 7, T1  . APPENDECTOMY    . COLONOSCOPY N/A 02/26/2014   Adequate preparation. Minimal anal canal hemorrhoids;otherwise, normal rectumdiminutive polyps in the middescending segment; otherwise, the remainder of colonic mucosaappeared normal. Hyperplastic polyps.  . ESOPHAGOGASTRODUODENOSCOPY N/A 02/26/2014   EQA:STMHDQQI distal esophagus, query short segment Barrett's. Status post Venia Minks dilation with esophageal biopsy. Abnormal gastric  mucosa. Esophageal biopsy negative for Barrett's. Gastric biopsy showed mild chronic gastritis, no H pylori.  Dossie Der NERVE BLOCK Right 06/16/2020   Procedure: INTERCOSTAL NERVE BLOCK;  Surgeon: [name], MD;  Location: [facility];  Service: Thoracic;  Laterality: Right;  . LAPAROSCOPIC APPENDECTOMY N/A  09/10/2013   Procedure: APPENDECTOMY LAPAROSCOPIC;  Surgeon: Jamesetta So, MD;  Location: [facility] ORS;  Service: General;  Laterality: N/A;  . LOWER EXTREMITY ANGIOGRAM N/A 04/04/2012   Procedure: LOWER EXTREMITY ANGIOGRAM;  Surgeon: Lorretta Harp, MD;  Location: [facility] CATH LAB;  Service: Cardiovascular;  Laterality: N/A;  . Venia Minks DILATION N/A 02/26/2014   Procedure: Venia Minks DILATION;  Surgeon: Daneil Dolin, MD;  Location: [facility] ENDO SUITE;  Service: Endoscopy;  Laterality: N/A;  . NODE DISSECTION Right 06/16/2020   Procedure: NODE DISSECTION;  Surgeon: [name], MD;  Location: [facility];  Service: Thoracic;  Laterality: Right;  . PERIPHERAL ARTERIAL STENT GRAFT  04/04/2012  . RENAL ANGIOGRAM Left 04/22/2014   Procedure: RENAL ANGIOGRAM;  Surgeon: Lorretta Harp, MD;  Location: [facility] CATH LAB;  Service: Cardiovascular;  Laterality: Left;  . TUBAL LIGATION  1990  . VASCULAR SURGERY  03/31/2016   Peripheral arterial stent  . VIDEO BRONCHOSCOPY WITH ENDOBRONCHIAL NAVIGATION N/A 04/28/2020   Procedure: VIDEO BRONCHOSCOPY WITH ENDOBRONCHIAL NAVIGATION;  Surgeon: [name], MD;  Location: [facility];  Service: Thoracic;  Laterality: N/A;  . VIDEO BRONCHOSCOPY WITH ENDOBRONCHIAL NAVIGATION N/A 06/16/2020   Procedure: VIDEO BRONCHOSCOPY WITH ENDOBRONCHIAL NAVIGATION;  Surgeon: [name], MD;  Location: [facility] OR;  Service: Thoracic;  Laterality: N/A;    SOCIAL HISTORY:  Social History   Socioeconomic History  . Marital status: Married    Spouse name: Not on file  . Number of children: Not on file  . Years of education: Not on file  . Highest education level: Not on file  Occupational History  . Occupation: Chiropodist: [employer]    Comment: [address]  Tobacco Use  . Smoking status: Current Every Day Smoker    Packs/day: 1.00    Years: 44.00    Pack years: 44.00    Types: Cigarettes  . Smokeless tobacco: Never Used  Vaping Use  . Vaping Use:  Never used  Substance and Sexual Activity  . Alcohol use: No  . Drug use: No  . Sexual activity: Yes  Other Topics Concern  . Not on file  Social History Narrative  . Not on file   Social Determinants of Health   Financial Resource Strain: Low Risk   . Difficulty of Paying Living Expenses: Not hard at all  Food Insecurity: No Food Insecurity  . Worried About Charity fundraiser in the Last Year: Never true  . Ran Out of Food in the Last Year: Never true  Transportation Needs: No Transportation Needs  . Lack of Transportation (Medical): No  . Lack of Transportation (Non-Medical): No  Physical Activity: Insufficiently Active  . Days of Exercise per Week: 3 days  . Minutes of Exercise per Session: 10 min  Stress: No Stress Concern Present  . Feeling of Stress : Only a little  Social Connections: Socially Isolated  . Frequency of Communication with Friends and Family: More than three times a week  . Frequency of Social Gatherings with Friends and Family: More than three times a week  . Attends Religious Services: Never  . Active Member of Clubs or Organizations: No  .  Attends Archivist Meetings: Never  . Marital Status: Divorced  Human resources officer Violence: Not At Risk  . Fear of Current or Ex-Partner: No  . Emotionally Abused: No  . Physically Abused: No  . Sexually Abused: No    FAMILY HISTORY:  Family History  Problem Relation Age of Onset  . Leukemia Father   . Cancer Father   . Heart disease Father   . Heart disease Mother   . Heart disease Other   . Arthritis Other   . Asthma Other   . Colon cancer Other        unknown, possibly father    CURRENT MEDICATIONS:  Current Outpatient Medications  Medication Sig Dispense Refill  . amLODipine (NORVASC) 5 MG tablet Take 1 tablet (5 mg total) by mouth daily. 180 tablet 3  . aspirin EC 81 MG tablet Take 81 mg by mouth daily.    . Budeson-Glycopyrrol-Formoterol (BREZTRI AEROSPHERE) 160-9-4.8 MCG/ACT AERO  Inhale 2 puffs into the lungs in the morning and at bedtime. Take 2 puffs first thing in am and then another 2 puffs about 12 hours later. (Patient taking differently: Inhale 2 puffs into the lungs in the morning and at bedtime.) 10.7 g 11  . cholecalciferol (VITAMIN D3) 25 MCG (1000 UNIT) tablet Take 2,000 Units by mouth daily.    . clopidogrel (PLAVIX) 75 MG tablet Take 1 tablet (75 mg total) by mouth daily. PLEASE SCHEDULE APPOINTMENT FOR FURTHER REFILLS. 2ND ATTEMPT (Patient taking differently: Take 75 mg by mouth daily.) 15 tablet 0  . cyclobenzaprine (FLEXERIL) 10 MG tablet Take 1 tablet (10 mg total) by mouth 3 (three) times daily as needed for muscle spasms. 30 tablet 1  . DEXILANT 60 MG capsule TAKE 1 CAPSULE ONCE DAILY (Patient taking differently: Take 60 mg by mouth daily.) 90 capsule 1  . escitalopram (LEXAPRO) 10 MG tablet Take 10 mg by mouth daily.     . fenofibrate 160 MG tablet Take 160 mg by mouth daily.     . folic acid (FOLVITE) 1 MG tablet Take 1 tablet (1 mg total) by mouth daily. 30 tablet 4  . metoprolol tartrate (LOPRESSOR) 25 MG tablet Take 1 tablet (25 mg total) by mouth 2 (two) times daily as needed (afib). 60 tablet 0  . prochlorperazine (COMPAZINE) 10 MG tablet Take 1 tablet (10 mg total) by mouth every 6 (six) hours as needed for nausea or vomiting. 30 tablet 3  . Red Yeast Rice 600 MG CAPS Take 600 mg by mouth daily.     . rosuvastatin (CRESTOR) 20 MG tablet Take 20 mg by mouth daily.     . vitamin B-12 (CYANOCOBALAMIN) 1000 MCG tablet Take 1 tablet (1,000 mcg total) by mouth daily. 60 tablet 2  . acetaminophen (TYLENOL) 500 MG tablet Take 500 mg by mouth every 6 (six) hours as needed for moderate pain. (Patient not taking: Reported on 08/26/2020)    . albuterol (VENTOLIN HFA) 108 (90 Base) MCG/ACT inhaler Inhale 1-2 puffs into the lungs every 6 (six) hours as needed for wheezing or shortness of breath. (Patient not taking: Reported on 08/26/2020)     No current  facility-administered medications for this visit.    ALLERGIES:  Allergies  Allergen Reactions  . Latex Rash    Blisters  . Codeine Nausea And Vomiting    PHYSICAL EXAM:  Performance status (ECOG): 1 - Symptomatic but completely ambulatory  Vitals:   08/26/20 1421  BP: 129/80  Pulse: 79  Resp:  18  Temp: 97.8 F (36.6 C)  SpO2: 99%   Wt Readings from Last 3 Encounters:  08/26/20 141 lb 4.8 oz (64.1 kg)  08/07/20 142 lb 11.2 oz (64.7 kg)  07/29/20 144 lb 4.8 oz (65.5 kg)   Physical Exam Vitals reviewed.  Constitutional:      Appearance: Normal appearance.  Neurological:     General: No focal deficit present.     Mental Status: She is alert and oriented to person, place, and time.  Psychiatric:        Mood and Affect: Mood normal.        Behavior: Behavior normal.      LABORATORY DATA:  I have reviewed the labs as listed.  CBC Latest Ref Rng & Units 08/07/2020 06/18/2020 06/17/2020  WBC 4.0 - 10.5 K/uL 6.1 7.4 8.7  Hemoglobin 12.0 - 15.0 g/dL 13.8 12.2 13.4  Hematocrit 36.0 - 46.0 % 42.5 37.6 41.4  Platelets 150 - 400 K/uL 213 152 163   CMP Latest Ref Rng & Units 08/07/2020 08/01/2020 06/18/2020  Glucose 70 - 99 mg/dL 99 - 114(H)  BUN 6 - 20 mg/dL 23(H) - 8  Creatinine 0.44 - 1.00 mg/dL 1.13(H) 1.20(H) 0.89  Sodium 135 - 145 mmol/L 133(L) - 140  Potassium 3.5 - 5.1 mmol/L 4.8 - 3.0(L)  Chloride 98 - 111 mmol/L 101 - 105  CO2 22 - 32 mmol/L 25 - 25  Calcium 8.9 - 10.3 mg/dL 9.9 - 9.0  Total Protein 6.5 - 8.1 g/dL 7.7 - 5.7(L)  Total Bilirubin 0.3 - 1.2 mg/dL 0.4 - 0.5  Alkaline Phos 38 - 126 U/L 62 - 55  AST 15 - 41 U/L 24 - 31  ALT 0 - 44 U/L 26 - 22    DIAGNOSTIC IMAGING:  I have independently reviewed the scans and discussed with the patient. CT Chest W Contrast  Result Date: 08/01/2020 CLINICAL DATA:  Adenocarcinoma of the right lung. Status post right middle lobe wedge resection and right upper lobe anterior segmentectomy. Restaging. EXAM: CT CHEST  WITH CONTRAST TECHNIQUE: Multidetector CT imaging of the chest was performed during intravenous contrast administration. CONTRAST:  32mL OMNIPAQUE IOHEXOL 300 MG/ML  SOLN COMPARISON:  Left 1621 FINDINGS: Cardiovascular: The heart size is normal. No substantial pericardial effusion. Coronary artery calcification is evident. Atherosclerotic calcification is noted in the wall of the thoracic aorta. Mediastinum/Nodes: Scattered small mediastinal lymph nodes again noted. Soft tissue fullness in the right hilum and parahilar lung noted, presumably postsurgical. 10 mm short axis inferior left hilar node was not definitely present on the prior noncontrast exam. There is no axillary lymphadenopathy. Lungs/Pleura: Nodular band like opacity in the parahilar right middle lobe is presumably postsurgical on this first postoperative follow-up study. 4 mm posterior right upper lobe nodule on 38/4 may be the 4 mm posterior nodule seen on 30/2/8 previously but reposition due to volume loss/distortion from surgery. Consolidative opacity anterior left lower lobe along the major fissure measures 2.6 x 1.2 cm today (87/4) compared to 3.1 x 1.5 cm previously. 7 mm nodular density anterior to the left major fissure on 79/4 is also similar to prior. No pleural effusion. Upper Abdomen: Bilateral adrenal nodules are stable comparing to PET-CT 04/14/2020 a cannot be definitively characterized on today's exam. Musculoskeletal: No worrisome lytic or sclerotic osseous abnormality. IMPRESSION: 1. Interval resection of the right upper and middle lobe pulmonary lesions. This serves as the first postoperative baseline. No definite findings of recurrent or metastatic disease. 2. 10 mm  short axis inferior left hilar node was not readily evident on previous noncontrast CT but represents the lymph node showing hypermetabolic FDG uptake on PET-CT of 04/14/2020. Close attention on follow-up recommended. 3. Dense anterior left lower lobe consolidative  opacity is similar to minimally decreased in the interval. 4. Additional scattered bilateral pulmonary nodules are stable. 5. Bilateral adrenal nodules are stable comparing to PET-CT 04/14/2020. Continued attention on follow-up recommended. 6. Aortic Atherosclerosis (ICD10-I70.0). Electronically Signed   By: Misty Stanley M.D.   On: 08/01/2020 15:22     ASSESSMENT:  1.Right upper lobe adenocarcinoma: -PET scan on April 14, 2020 shows right upper lobe pulmonary nodule with SUV 5.6. Dense masslike consolidative opacity in the anterior left lower lobe with SUV 11.7. Hypermetabolic nodule seen just medial to this dominant lesion is hypermetabolic but somewhat obscured by breathing motion. 7 mm short axis paraesophageal lymph node with SUV 4.7. Focal hypermetabolism in the left hilum with SUV 4.0. 13 mm right adrenal adenoma. -Navigational bronchoscopy by [name] showed non-small cell carcinoma, adenocarcinoma of the right upper lobe nodule. Right middle lobe nodule nondiagnostic. Left lower lobe infiltrate/question mass nondiagnostic. -Right lung wedge resection and segmentectomy on 06/16/2020. 0.8 cm well differentiated invasive adenocarcinoma the right middle lobe. 1.7 cm invasive adenocarcinoma, moderately differentiated in the right upper lobe. Metastatic carcinoma involving level 13 lymph node. Total 1/18 lymph nodes involved. Pathological staging PT4PN1A. Visceral pleura invasion present. Spread through airspaces present. Positive lymphovascular invasion. - CT chest with contrast on 08/01/2020 shows interval resection of the right upper lobe middle lobe primary lesions.  10 mm inferior left hilar lymph node was not readily evident on noncontrast CT from November.  However this was seen on PET scan from 04/14/2020.  Dense anterior left lower lobe consolidative opacity is slightly decreased and measures 2.6 x 1.2 cm.  2.Iron deficiency state: -On intermittent parenteral  iron therapy since 2015. -Last Feraheme on December 28, 2018. -Latest CBC shows hemoglobin 14.6. Last ferritin 101 on April 03, 2020. Percent saturation is 13.  3. B12 deficiency: -She received B12 injections on May 22, 2019.   PLAN:  1.Stage III (PT4PN1A) right lung adenocarcinoma: -We reviewed results of PD-L1 testing which showed 10% positive. -I have discussed standard recommendation of 4 cycles of platinum based chemotherapy followed by 1 year of immunotherapy. -I have discussed the data from Atezolizumab study which showed about 15% improvement in survival after administration after chemotherapy. -We also reviewed CT results from 08/01/2020 which showed 10 mm left hilar lymph node.  We will closely monitor. -Patient does not want to receive any type of chemotherapy but opted for active surveillance. -I plan to see her back in 3 months with repeat CT of the chest with contrast.  2.  Nausea: -She does report occasional nausea since surgery. -We will start her on Compazine 10 mg every 6 hours as needed.  3.  Iron deficiency state: -She receives intermittent Feraheme infusion.  She also has mild renal insufficiency. -We reviewed labs from 08/07/2020 which showed ferritin of 181.  Hemoglobin is 13.8.  No parenteral iron therapy is needed.   Orders placed this encounter:  Orders Placed This Encounter  Procedures  . CT Chest W Contrast   Total time spent is 30 minutes with more than 50% of the time spent face-to-face discussing scan results, treatment plan, counseling and coordination of care.  Derek Jack, MD [place] [phone]   I, Milinda Antis, am acting as a scribe for Dr. Dirk Dress  [place].  I, Derek Jack MD, have reviewed the above documentation for accuracy and completeness, and I agree with the above.

## 2020-08-27 ENCOUNTER — Telehealth: Payer: Self-pay

## 2020-08-27 NOTE — Telephone Encounter (Signed)
Attempted to contact patient to schedule covid vaccine and there was no answer.

## 2020-09-01 ENCOUNTER — Other Ambulatory Visit: Payer: Self-pay | Admitting: Thoracic Surgery (Cardiothoracic Vascular Surgery)

## 2020-09-01 DIAGNOSIS — R911 Solitary pulmonary nodule: Secondary | ICD-10-CM

## 2020-09-01 DIAGNOSIS — C3491 Malignant neoplasm of unspecified part of right bronchus or lung: Secondary | ICD-10-CM

## 2020-09-02 ENCOUNTER — Encounter: Payer: Self-pay | Admitting: Thoracic Surgery (Cardiothoracic Vascular Surgery)

## 2020-09-02 ENCOUNTER — Other Ambulatory Visit: Payer: Self-pay

## 2020-09-02 ENCOUNTER — Ambulatory Visit
Admission: RE | Admit: 2020-09-02 | Discharge: 2020-09-02 | Disposition: A | Discharge status: Home / Self Care | Payer: Commercial Managed Care - PPO | Source: Ambulatory Visit | Attending: Thoracic Surgery (Cardiothoracic Vascular Surgery) | Admitting: Thoracic Surgery (Cardiothoracic Vascular Surgery)

## 2020-09-02 ENCOUNTER — Ambulatory Visit (INDEPENDENT_AMBULATORY_CARE_PROVIDER_SITE_OTHER): Payer: Self-pay | Admitting: Thoracic Surgery (Cardiothoracic Vascular Surgery)

## 2020-09-02 DIAGNOSIS — R911 Solitary pulmonary nodule: Secondary | ICD-10-CM

## 2020-09-02 DIAGNOSIS — C3491 Malignant neoplasm of unspecified part of right bronchus or lung: Secondary | ICD-10-CM

## 2020-09-02 DIAGNOSIS — Z9889 Other specified postprocedural states: Secondary | ICD-10-CM | POA: Insufficient documentation

## 2020-09-02 NOTE — Progress Notes (Signed)
[address]       [place] [address]             [phone]      HPI: Connie Farrell returns for a scheduled follow-up visit following her recent lung resection.  Connie Farrell is a 60 year old woman with a history of tobacco abuse, lung cancer, hypertension, hyperlipidemia, atherosclerotic cardiovascular disease, PAD, renal artery stenosis, reflux, arthritis, macrocytic anemia, and anxiety.  She was found to have a lung nodule on a low-dose screening CT in September 2019. She was followed with serial CTs. By September 2021 the nodule had increased in size and there was a new right middle lobe nodule. On PET CT the right lung nodules were hypermetabolic. There also was a new area in the left lower lobe that was felt to be infectious or inflammatory in nature.  Navigational bronchoscopy showed non-small cell carcinoma of the right upper lobe nodule. Biopsies from the left lower lobe showed no malignancy. She was treated with a course of antibiotics and the left lower lobe process did improve to some degree.  I then did a robotic right upper lobe segmentectomy and right middle lobe wedge resection on 06/16/2020. Her postoperative course was uncomplicated. Both the upper lobe and middle lobe nodules were adenocarcinoma. There was 1 level 13 lymph node involved. Final stage was IIIA (T4, N1).  She saw Dr. Delton Coombes. She opted not to have chemotherapy.  She feels well. She had a fall recently has had some soreness in her chest since then but is not having to take any narcotics. Overall she feels like her breathing is a little better than it had been early on after the surgery. She has not smoked since the day prior to her surgery.  Past Medical History:  Diagnosis Date  . Anxiety   . Arthritis    "hands; spine too" (04/04/2012)  . COPD (chronic obstructive pulmonary disease) ([place])   . Exertional dyspnea   . Folate deficiency 10/17/2015  . GERD (gastroesophageal reflux  disease)   . Hyperlipemia   . Hypertension   . Macrocytosis 10/17/2015  . Neck pain, chronic   . PAD (peripheral artery disease) ([place])   . Renal artery stenosis ([place])   . Seasonal allergies   . Tobacco abuse     Current Outpatient Medications  Medication Sig Dispense Refill  . acetaminophen (TYLENOL) 500 MG tablet Take 500 mg by mouth every 6 (six) hours as needed for moderate pain.    Marland Kitchen albuterol (VENTOLIN HFA) 108 (90 Base) MCG/ACT inhaler Inhale 1-2 puffs into the lungs every 6 (six) hours as needed for wheezing or shortness of breath.    Marland Kitchen aspirin EC 81 MG tablet Take 81 mg by mouth daily.    . Budeson-Glycopyrrol-Formoterol (BREZTRI AEROSPHERE) 160-9-4.8 MCG/ACT AERO Inhale 2 puffs into the lungs in the morning and at bedtime. Take 2 puffs first thing in am and then another 2 puffs about 12 hours later. (Patient taking differently: Inhale 2 puffs into the lungs in the morning and at bedtime.) 10.7 g 11  . cholecalciferol (VITAMIN D3) 25 MCG (1000 UNIT) tablet Take 2,000 Units by mouth daily.    . clopidogrel (PLAVIX) 75 MG tablet Take 1 tablet (75 mg total) by mouth daily. PLEASE SCHEDULE APPOINTMENT FOR FURTHER REFILLS. 2ND ATTEMPT (Patient taking differently: Take 75 mg by mouth daily.) 15 tablet 0  . cyclobenzaprine (FLEXERIL) 10 MG tablet Take 1 tablet (10 mg total) by mouth 3 (three)  times daily as needed for muscle spasms. 30 tablet 1  . DEXILANT 60 MG capsule TAKE 1 CAPSULE ONCE DAILY (Patient taking differently: Take 60 mg by mouth daily.) 90 capsule 1  . escitalopram (LEXAPRO) 10 MG tablet Take 10 mg by mouth daily.     . fenofibrate 160 MG tablet Take 160 mg by mouth daily.     . folic acid (FOLVITE) 1 MG tablet Take 1 mg by mouth daily.    . metoprolol tartrate (LOPRESSOR) 25 MG tablet Take 1 tablet (25 mg total) by mouth 2 (two) times daily as needed (afib). 60 tablet 0  . prochlorperazine (COMPAZINE) 10 MG tablet Take 1 tablet (10 mg total) by mouth every 6 (six) hours as  needed for nausea or vomiting. 30 tablet 3  . Red Yeast Rice 600 MG CAPS Take 600 mg by mouth daily.     . rosuvastatin (CRESTOR) 20 MG tablet Take 20 mg by mouth daily.     . vitamin B-12 (CYANOCOBALAMIN) 1000 MCG tablet Take 1 tablet (1,000 mcg total) by mouth daily. 60 tablet 2  . amLODipine (NORVASC) 5 MG tablet Take 1 tablet (5 mg total) by mouth daily. 180 tablet 3   No current facility-administered medications for this visit.    Physical Exam BP 116/63 (BP Location: Left Arm, Patient Position: Sitting, Cuff Size: Normal)   Pulse 70   Temp 97.7 F (36.5 C) (Skin)   Resp 18   Wt 141 lb (64 kg)   SpO2 97% Comment: RA  BMI 24.20 kg/m  Well-appearing 60 year old woman in no acute distress Alert and oriented x3 with no focal deficits Lungs faint wheeze left base, otherwise clear Incisions well-healed Cardiac regular rate and rhythm  Diagnostic Tests: CHEST - 2 VIEW  COMPARISON:  07/22/2020, 08/01/2020  FINDINGS: The heart size and mediastinal contours are within normal limits. Similar appearance of areas of scarring and/or postoperative change within the right suprahilar region and within the left lung base. No new focal airspace consolidation. No pleural effusion. No pneumothorax. The visualized skeletal structures are unremarkable.  IMPRESSION: No acute cardiopulmonary process. Stable areas of scarring and/or postoperative change within the right suprahilar region and left lung base.   Electronically Signed   By: Davina Poke D.O.   On: 09/02/2020 10:17  I personally reviewed the chest x-ray. Scarring bilaterally. No evidence of recurrent disease.  Impression: Connie Farrell is a 60 year old woman with a history of tobacco abuse, lung cancer, hypertension, hyperlipidemia, atherosclerotic cardiovascular disease, PAD, renal artery stenosis, reflux, arthritis, macrocytic anemia, and anxiety.  Adenocarcinoma of right upper and middle lobes, stage IIIa  (T4N1)-status post surgical resection. She saw Dr. Delton Coombes who recommended adjuvant therapy. She opted not to have chemotherapy. She had a CT in January. It showed postoperative changes on the right. There was some decrease in size of the consolidative opacity in the left lower lobe. She is scheduled to have another CT in May. I will see her back after that scan. She may still need a repeat bronchoscopy of that area.  Tobacco abuse-has not smoked for 2-1/2 months. I congratulated her on that and emphasized the importance of continued abstinence.  Plan: Follow-up with Dr. Delton Coombes as scheduled Return in May after CT chest  [name], MD [facility] [phone]

## 2020-09-11 NOTE — Progress Notes (Signed)
Cardiology Clinic Note   Patient Name: Connie Farrell Date of Encounter: 09/12/2020  Primary Care Provider:  Celene Squibb, MD Primary Cardiologist:  Quay Burow, MD  Patient Profile    Connie Farrell 60 year old female presents the clinic today for follow-up evaluation of her hypertension and PAD.  Past Medical History    Past Medical History:  Diagnosis Date  . Anxiety   . Arthritis    "hands; spine too" (04/04/2012)  . COPD (chronic obstructive pulmonary disease) ([place])   . Exertional dyspnea   . Folate deficiency 10/17/2015  . GERD (gastroesophageal reflux disease)   . Hyperlipemia   . Hypertension   . Macrocytosis 10/17/2015  . Neck pain, chronic   . PAD (peripheral artery disease) ([place])   . Renal artery stenosis ([place])   . Seasonal allergies   . Tobacco abuse    Past Surgical History:  Procedure Laterality Date  . ABDOMINAL ANGIOGRAM  04/04/2012   Procedure: ABDOMINAL ANGIOGRAM;  Surgeon: Lorretta Harp, MD;  Location: [facility] CATH LAB;  Service: Cardiovascular;;  . ANTERIOR CERVICAL DECOMP/DISCECTOMY FUSION  ~ 04/2006   C6, 7, T1  . APPENDECTOMY    . COLONOSCOPY N/A 02/26/2014   Adequate preparation. Minimal anal canal hemorrhoids;otherwise, normal rectumdiminutive polyps in the middescending segment; otherwise, the remainder of colonic mucosaappeared normal. Hyperplastic polyps.  . ESOPHAGOGASTRODUODENOSCOPY N/A 02/26/2014   VCB:SWHQPRFF distal esophagus, query short segment Barrett's. Status post Venia Minks dilation with esophageal biopsy. Abnormal gastric mucosa. Esophageal biopsy negative for Barrett's. Gastric biopsy showed mild chronic gastritis, no H pylori.  Dossie Der NERVE BLOCK Right 06/16/2020   Procedure: INTERCOSTAL NERVE BLOCK;  Surgeon: [name], MD;  Location: [facility];  Service: Thoracic;  Laterality: Right;  . LAPAROSCOPIC APPENDECTOMY N/A 09/10/2013   Procedure: APPENDECTOMY LAPAROSCOPIC;  Surgeon: Jamesetta So, MD;  Location: [facility] ORS;   Service: General;  Laterality: N/A;  . LOWER EXTREMITY ANGIOGRAM N/A 04/04/2012   Procedure: LOWER EXTREMITY ANGIOGRAM;  Surgeon: Lorretta Harp, MD;  Location: [facility] CATH LAB;  Service: Cardiovascular;  Laterality: N/A;  . Venia Minks DILATION N/A 02/26/2014   Procedure: Venia Minks DILATION;  Surgeon: Daneil Dolin, MD;  Location: [facility] ENDO SUITE;  Service: Endoscopy;  Laterality: N/A;  . NODE DISSECTION Right 06/16/2020   Procedure: NODE DISSECTION;  Surgeon: [name], MD;  Location: [place];  Service: Thoracic;  Laterality: Right;  . PERIPHERAL ARTERIAL STENT GRAFT  04/04/2012  . RENAL ANGIOGRAM Left 04/22/2014   Procedure: RENAL ANGIOGRAM;  Surgeon: Lorretta Harp, MD;  Location: [facility] CATH LAB;  Service: Cardiovascular;  Laterality: Left;  . TUBAL LIGATION  1990  . VASCULAR SURGERY  03/31/2016   Peripheral arterial stent  . VIDEO BRONCHOSCOPY WITH ENDOBRONCHIAL NAVIGATION N/A 04/28/2020   Procedure: VIDEO BRONCHOSCOPY WITH ENDOBRONCHIAL NAVIGATION;  Surgeon: [name], MD;  Location: [place];  Service: Thoracic;  Laterality: N/A;  . VIDEO BRONCHOSCOPY WITH ENDOBRONCHIAL NAVIGATION N/A 06/16/2020   Procedure: VIDEO BRONCHOSCOPY WITH ENDOBRONCHIAL NAVIGATION;  Surgeon: [name], MD;  Location: [facility] OR;  Service: Thoracic;  Laterality: N/A;    Allergies  Allergies  Allergen Reactions  . Latex Rash    Blisters  . Codeine Nausea And Vomiting    History of Present Illness    Connie Farrell has a PMH of PAD, hypertension, renal artery stenosis, COPD, acute appendicitis, GERD, tobacco abuse, HLD, claudication, dysphagia, right lower quadrant abdominal pain, palpitations, and lung nodule.  She was last seen by [name]  on 03/19/2020.  She was initially referred by [name] for peripheral vascular evaluation.  She was noted to have risk factors of 30-pack-year smoking history, she was reluctant to risk modification.  She indicated she had a strong family history of heart  disease.  She had a negative nuclear stress test by [name] at [place] [facility].  She complained of claudication.  Her Dopplers suggested mild diminished left ABI performed at anytime hospital and confirmed in our lab suggesting high-grade left common iliac artery stenosis.  [name] performed an angiogram 9/13 and confirmed this with a 40 mm pullback gradient.  She received a left common iliac artery stent which improved her left ABI from 0.8-2 0.96.  She also noted improvement in her claudication symptoms.  Her renal Dopplers have been followed closely by [place] to watch for progression of disease.  She had an increase in her serum creatinine from 1.2-2.0.  Her PCP discontinued her ACE inhibitor.  [name] performed angiography on her 10/15 which showed 60% left renal artery stenosis with a 20 mmHg gradient.  Her left iliac artery stent was widely patent.  Medical management was recommended.  During her follow-up visit 03/19/2020 she continued to smoke.  She reported progressive claudication in her left greater than her right.  She was noted to have labile hypertension.  She was scheduled for renal and lower extremity arterial Dopplers in the next month.  Her lower extremity arterial duplex 05/05/2020 showed widely patent left CFA, SFA, popliteal and tibial trunk without focal stenosis.  She was seen and evaluated by [name].  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  Surgery is scheduled for 06/16/2020.  She presented to the clinic 06/11/2020 for follow-up evaluation stated she continued to have left lower extremity claudication.  She stated that she was able to walk fairly well on flat ground however when she walked upstairs or a slight incline she notices claudication.  We reviewed her ABI results and recommendations.  She expressed understanding.  She was still smoking around 1  pack/day.  She denied increased work of breathing and accelerated heart rates.  I will gave her smoking cessation information, had her continue to be as physically active as she could , and planned follow-up for 3 months.  She underwent robotic right upper lobe and middle lobe wedge resection 06/16/2020.  Her postoperative course was uncomplicated.  It was noted that both upper and middle lobes were adenocarcinoma.  She was referred to oncology postoperatively and opted not to have chemotherapy.  She presents the clinic today for follow-up evaluation states she feels like she is doing very well since her surgery.  She met with oncology who told her that her odds of having recurrent cancer would be around 50% even with chemotherapy.  She elected not to do chemotherapy.  She has been increasing her physical activity and reports that her blood pressure has been much better at home and 376E systolic.  She reports that she does not have any pain and she does not have any lower extremity claudication.  She stopped smoking and reports that she does not have any cravings.  I will give her a blood pressure log, have her increase her physical activity as tolerated, continue her low-salt heart healthy diet, and follow-up in 6 months.  Today she denies chest pain, shortness of breath, lower extremity edema, fatigue, palpitations, melena, hematuria, hemoptysis, diaphoresis,  weakness, presyncope, syncope, orthopnea, and PND.  Home Medications    Prior to Admission medications   Medication Sig Start Date End Date Taking? Authorizing Provider  acetaminophen (TYLENOL) 500 MG tablet Take 500 mg by mouth every 6 (six) hours as needed for moderate pain.    [provider]  albuterol (VENTOLIN HFA) 108 (90 Base) MCG/ACT inhaler Inhale 1-2 puffs into the lungs every 6 (six) hours as needed for wheezing or shortness of breath.    [provider]  amLODipine (NORVASC) 5 MG tablet Take 1 tablet (5 mg total)  by mouth daily. 03/19/20 06/17/20  Lorretta Harp, MD  aspirin EC 81 MG tablet Take 81 mg by mouth daily.    [provider]  Budeson-Glycopyrrol-Formoterol (BREZTRI AEROSPHERE) 160-9-4.8 MCG/ACT AERO Inhale 2 puffs into the lungs in the morning and at bedtime. Take 2 puffs first thing in am and then another 2 puffs about 12 hours later. Patient taking differently: Inhale 2 puffs into the lungs in the morning and at bedtime. 02/29/20   Tanda Rockers, MD  cholecalciferol (VITAMIN D3) 25 MCG (1000 UNIT) tablet Take 2,000 Units by mouth daily.    [provider]  clopidogrel (PLAVIX) 75 MG tablet Take 1 tablet (75 mg total) by mouth daily. PLEASE SCHEDULE APPOINTMENT FOR FURTHER REFILLS. 2ND ATTEMPT Patient taking differently: Take 75 mg by mouth daily. 06/20/19   Lorretta Harp, MD  cyclobenzaprine (FLEXERIL) 10 MG tablet Take 1 tablet (10 mg total) by mouth 3 (three) times daily as needed for muscle spasms. 08/07/20   Derek Jack, MD  DEXILANT 60 MG capsule TAKE 1 CAPSULE ONCE DAILY Patient taking differently: Take 60 mg by mouth daily. 01/25/20   Lockamy, Randi L, NP-C  escitalopram (LEXAPRO) 10 MG tablet Take 10 mg by mouth daily.  02/21/12   [provider]  fenofibrate 160 MG tablet Take 160 mg by mouth daily.  05/25/18   [provider]  folic acid (FOLVITE) 1 MG tablet Take 1 mg by mouth daily.    [provider]  metoprolol tartrate (LOPRESSOR) 25 MG tablet Take 1 tablet (25 mg total) by mouth 2 (two) times daily as needed (afib). 04/28/20   [name], MD  prochlorperazine (COMPAZINE) 10 MG tablet Take 1 tablet (10 mg total) by mouth every 6 (six) hours as needed for nausea or vomiting. 08/26/20   Derek Jack, MD  Red Yeast Rice 600 MG CAPS Take 600 mg by mouth daily.     [provider]  rosuvastatin (CRESTOR) 20 MG tablet Take 20 mg by mouth daily.  06/19/18   [provider]  vitamin B-12  (CYANOCOBALAMIN) 1000 MCG tablet Take 1 tablet (1,000 mcg total) by mouth daily. 04/22/17   Twana First, MD    Family History    Family History  Problem Relation Age of Onset  . Leukemia Father   . Cancer Father   . Heart disease Father   . Heart disease Mother   . Heart disease Other   . Arthritis Other   . Asthma Other   . Colon cancer Other        unknown, possibly father   She indicated that her mother is alive. She indicated that her father is deceased.  Social History    Social History   Socioeconomic History  . Marital status: Married    Spouse name: Not on file  . Number of children: Not on file  . Years of education: Not  on file  . Highest education level: Not on file  Occupational History  . Occupation: Chiropodist: [employer]    Comment: [address]  Tobacco Use  . Smoking status: Former Smoker    Packs/day: 1.00    Years: 44.00    Pack years: 44.00    Types: Cigarettes    Quit date: 06/15/2020    Years since quitting: 0.2  . Smokeless tobacco: Never Used  Vaping Use  . Vaping Use: Never used  Substance and Sexual Activity  . Alcohol use: No  . Drug use: No  . Sexual activity: Yes  Other Topics Concern  . Not on file  Social History Narrative  . Not on file   Social Determinants of Health   Financial Resource Strain: Low Risk   . Difficulty of Paying Living Expenses: Not hard at all  Food Insecurity: No Food Insecurity  . Worried About Charity fundraiser in the Last Year: Never true  . Ran Out of Food in the Last Year: Never true  Transportation Needs: No Transportation Needs  . Lack of Transportation (Medical): No  . Lack of Transportation (Non-Medical): No  Physical Activity: Insufficiently Active  . Days of Exercise per Week: 3 days  . Minutes of Exercise per Session: 10 min  Stress: No Stress Concern Present  . Feeling of Stress : Only a little  Social Connections: Socially Isolated  . Frequency of  Communication with Friends and Family: More than three times a week  . Frequency of Social Gatherings with Friends and Family: More than three times a week  . Attends Religious Services: Never  . Active Member of Clubs or Organizations: No  . Attends Archivist Meetings: Never  . Marital Status: Divorced  Human resources officer Violence: Not At Risk  . Fear of Current or Ex-Partner: No  . Emotionally Abused: No  . Physically Abused: No  . Sexually Abused: No     Review of Systems    General:  No chills, fever, night sweats or weight changes.  Cardiovascular:  No chest pain, dyspnea on exertion, edema, orthopnea, palpitations, paroxysmal nocturnal dyspnea. Dermatological: No rash, lesions/masses Respiratory: No cough, dyspnea Urologic: No hematuria, dysuria Abdominal:   No nausea, vomiting, diarrhea, bright red blood per rectum, melena, or hematemesis Neurologic:  No visual changes, wkns, changes in mental status. All other systems reviewed and are otherwise negative except as noted above.  Physical Exam    VS:  BP 134/90 (BP Location: Left Arm, Patient Position: Sitting, Cuff Size: Normal)   Pulse (!) 55   Ht '5\' 4"'  (1.626 m)   Wt 139 lb (63 kg)   SpO2 97%   BMI 23.86 kg/m  , BMI Body mass index is 23.86 kg/m. GEN: Well nourished, well developed, in no acute distress. HEENT: normal. Neck: Supple, no JVD, carotid bruits, or masses. Cardiac: RRR, no murmurs, rubs, or gallops. No clubbing, cyanosis, edema.  Radials/DP/PT 2+ and equal bilaterally.  Respiratory:  Respirations regular and unlabored, clear to auscultation bilaterally. GI: Soft, nontender, nondistended, BS + x 4. MS: no deformity or atrophy. Skin: warm and dry, no rash. Neuro:  Strength and sensation are intact. Psych: Normal affect.  Accessory Clinical Findings    Recent Labs: 08/07/2020: ALT 26; BUN 23; Creatinine, Ser 1.13; Hemoglobin 13.8; Platelets 213; Potassium 4.8; Sodium 133   Recent Lipid  Panel No results found for: CHOL, TRIG, HDL, CHOLHDL, VLDL, LDLCALC, LDLDIRECT  ECG personally  reviewed by me today-none today. Lower extremity arterial duplex 05/05/2020 Widely patent left CFA, SFA, popliteal and tibial peroneal trunk, without  focal stenosis.     Summary:  Right: No infrainguinal focal stenosis.   Left: No infrainguinal focal stenosis.    Assessment & Plan   1.  Peripheral arterial disease-denies lower extremity claudication.  Reports leg pain resolved after VATS procedure.  Follow-up LAE's showed No infrainguinal focal stenosis bilaterally.    Continue aspirin, Plavix, fenofibrate, rosuvastatin Heart healthy low-sodium high-fiber diet-salty 6 given Increase physical activity as tolerated Continue to monitor/repeat LAE's when clinically indicated  Essential hypertension-BP today 134/90.  Well-controlled at home. Continue metoprolol, amlodipine Heart healthy low-sodium diet-salty 6 given Increase physical activity as tolerated Maintain blood pressure log  Hyperlipidemia-LDL 59 on 12/26/2018. Continue fenofibrate, rosuvastatin Heart healthy low-sodium high-fiber diet-salty 6 given Increase physical activity as tolerated Repeat fasting lipids and LFTs  Renal artery stenosis-renal duplex 04/23/2019 showed a left RAR 4.6 with pole-to-pole dimension of 8.9 cm   Right upper lobe adenocarcinoma-underwent successful VATS procedure with right upper lobe and middle lobe wedge resection.  She was referred to oncology and deferred chemotherapy.  06/16/2020 with [name].  Tobacco abuse- stopped smoking  Congratulated on cessation  Disposition: Follow-up with [name] or me  6 months.     Jossie Ng. Hamna Asa NP-C    09/12/2020, 9:54 AM [place] [address] Office [phone] Fax [phone]  Notice: This dictation was prepared with Dragon dictation along with smaller phrase technology. Any transcriptional  errors that result from this process are unintentional and may not be corrected upon review.  I spent 10 minutes examining this patient, reviewing medications, and using patient centered shared decision making involving her cardiac care.  Prior to her visit I spent greater than 20 minutes reviewing her past medical history,  medications, and prior cardiac tests.

## 2020-09-12 ENCOUNTER — Encounter: Payer: Self-pay | Admitting: General Practice

## 2020-09-12 ENCOUNTER — Other Ambulatory Visit: Payer: Self-pay

## 2020-09-12 ENCOUNTER — Ambulatory Visit: Payer: Commercial Managed Care - PPO | Admitting: General Practice

## 2020-09-12 VITALS — BP 134/90 | HR 55 | Ht 64.0 in | Wt 139.0 lb

## 2020-09-12 DIAGNOSIS — I701 Atherosclerosis of renal artery: Secondary | ICD-10-CM

## 2020-09-12 DIAGNOSIS — I739 Peripheral vascular disease, unspecified: Secondary | ICD-10-CM

## 2020-09-12 DIAGNOSIS — E782 Mixed hyperlipidemia: Secondary | ICD-10-CM | POA: Diagnosis not present

## 2020-09-12 DIAGNOSIS — I10 Essential (primary) hypertension: Secondary | ICD-10-CM

## 2020-09-12 DIAGNOSIS — C3411 Malignant neoplasm of upper lobe, right bronchus or lung: Secondary | ICD-10-CM

## 2020-09-12 DIAGNOSIS — Z720 Tobacco use: Secondary | ICD-10-CM

## 2020-09-12 NOTE — Patient Instructions (Signed)
Medication Instructions:  The current medical regimen is effective;  continue present plan and medications as directed. Please refer to the Current Medication list given to you today.  *If you need a refill on your cardiac medications before your next appointment, please call your pharmacy*  Lab Work:   Testing/Procedures:  NONE    NONE  Special Instructions TAKE AND LOG YOUR BLOOD PRESSURE  PLEASE READ AND FOLLOW SALTY 6-ATTACHED-1,800mg  daily  PLEASE INCREASE PHYSICAL ACTIVITY AS TOLERATED  Follow-Up: Your next appointment:  6 month(s) In Person with Quay Burow, MD OR IF UNAVAILABLE JESSE CLEAVER, FNP-C   Please call our office 2 months in advance to schedule this appointment   At [facility], you and your health needs are our priority.  As part of our continuing mission to provide you with exceptional heart care, we have created designated Provider Care Teams.  These Care Teams include your primary Cardiologist (physician) and Advanced Practice Providers (APPs -  Physician Assistants and Nurse Practitioners) who all work together to provide you with the care you need, when you need it.  We recommend signing up for the patient portal called "MyChart".  Sign up information is provided on this After Visit Summary.  MyChart is used to connect with patients for Virtual Visits (Telemedicine).  Patients are able to view lab/test results, encounter notes, upcoming appointments, etc.  Non-urgent messages can be sent to your provider as well.   To learn more about what you can do with MyChart, go to [email or web address].              6 SALTY THINGS TO AVOID     1,800MG  DAILY

## 2020-09-24 ENCOUNTER — Encounter (HOSPITAL_COMMUNITY): Payer: Self-pay

## 2020-09-24 NOTE — Progress Notes (Signed)
Call received from patient's husband, [name] regarding insurance denial for "genetic testing." I discuss genetic testing with [name] and assure him we did not refer the patient for genetic testing. Further discussed [facility] testing that was ordered on 07/29/20 and resulted on 08/19/20, I informed [name] that I cannot confirm that this is the testing that the insurance denial is referring to. I asked [name] to bring the insurance denial form to the [facility] for our financial counselor to review for coverage options. [name] states that he will do so at his earliest convenience.

## 2020-10-02 ENCOUNTER — Telehealth: Payer: Self-pay | Admitting: Internal Medicine

## 2020-10-02 NOTE — Telephone Encounter (Signed)
I will look out for paperwork -pr

## 2020-10-07 NOTE — Telephone Encounter (Signed)
In review of paperwork - pt needs to have an office visit to complete these forms since it is asking for treatment info and the response to treatment. Patient was to follow up in 3 months last seen 8/21 - sent message to Jake Samples who will call patient to schedule follow up with Dr. Melvyn Novas in [place] and forms can be discussed/filled out at that time -pr

## 2020-10-08 DIAGNOSIS — Z0289 Encounter for other administrative examinations: Secondary | ICD-10-CM

## 2020-10-10 ENCOUNTER — Ambulatory Visit: Payer: Commercial Managed Care - PPO | Admitting: Internal Medicine

## 2020-10-10 ENCOUNTER — Encounter: Payer: Self-pay | Admitting: Internal Medicine

## 2020-10-10 ENCOUNTER — Other Ambulatory Visit: Payer: Self-pay

## 2020-10-10 DIAGNOSIS — J449 Chronic obstructive pulmonary disease, unspecified: Secondary | ICD-10-CM

## 2020-10-10 NOTE — Assessment & Plan Note (Signed)
Quit smoking 05/2020 - 02/29/2020  After extensive coaching inhaler device,  effectiveness =    75%  - PFTs  04/30/20  wnl x low ERV - 10/10/2020  After extensive coaching inhaler device,  effectiveness =    75% short Ti  -  10/10/2020   Walked RA  approx   600 ft  @ fast pace  stopped due to  Sob at end with sats still 98%     She may have a minimal AB component but no copd so ok to use either breztri or dulera 200  15 min before sleep or before ex on an alternate day basis to see if there is a reproducible benefit an if not can just use saba prn with pulmonary f/u if needed  Will refer to sleep medicine next.    Each maintenance medication was reviewed in detail including emphasizing most importantly the difference between maintenance and prns and under what circumstances the prns are to be triggered using an action plan format where appropriate.  Total time for H and P, chart review, counseling, reviewing hfa  device(s) , directly observing portions of ambulatory 02 saturation study/ and generating customized AVS unique to this summary final  office visit / same day charting  > 30 min

## 2020-10-10 NOTE — Patient Instructions (Signed)
Try dulera  15 min before an activity that you know would make you short of breath and see if it makes any difference   Same thing for your night time symptoms   In both cases use the dulera on alternate days to prove whether it helps or not and if not don't take the breztri at all - if it does help then the correct dose of breztri  is Take 2 puffs first thing in am and then another 2 puffs about 12 hours later (automatically) until you return here to see the sleep doctor(who is also a pulmonary specialist)        We will set you up with a sleep medicine doctor here for a consultation

## 2020-10-10 NOTE — Progress Notes (Signed)
Connie Farrell, female    DOB: [birth year], 60 y.o.   MRN: [id number]   Brief patient profile:  62 yowf active smoker worked in [employer] until around 2000 with onset around 2019 gradually worse since just started on Breztri June 2021 and referred to pulmonary clinic by Dr Juel Burrow office / Elenor Quinones     History of Present Illness  02/29/2020  Pulmonary/ 1st office eval/ Elhadj Girton / [facility]  Chief Complaint  Patient presents with  . Pulmonary Consult    Referred by Pablo Lawrence, NP. Pt c/o SOB x 6 months. She also c/o cough and wheezing- cough with white to brown, thick sputum. She does not have a rescue inhaler and is only using Breztri.   Dyspnea:  Across parking lot and a couple of aisles at store/ poor on hills/step Cough: rattle varies / sev hours in am white sometimes very thick  Sleep: bed is flat/ 2 pillows due to neck  SABA use: doesn't have it yet   06/16/20 [place] bx  adenoca      10/10/2020  f/u ov/[place] [facility]/[place]ay Kath re:  GOLD 0 /quit smoking Jun 15 2020 not on maint rx Chief Complaint  Patient presents with  . Follow-up    Having soreness/swelling right side under breast, shortness of breath "sometimes" with activity   COPD GOLD ? / groud d/ still smoking  Active smoker - 02/29/2020  After extensive coaching inhaler device,  effectiveness =    75%  Multiple pulmonary nodules determined by computed tomography of lung Largest 8 mm as of 3 /15/21 > f/u q 6 m planned  Cigarette smoker  Dyspnea:  Bending over/ across [employer]  Cough: slt congested, soon p hs  Sleeping: bed is flat, 2 pillows  SABA use: rarely needs  02: none  Covid status: never vax  Main c/o is fatigue > sob/ sleeping poorly with witnessed sleep apnea per fm/ mod hypersominia daytime     No obvious day to day or daytime variability or assoc excess/ purulent sputum or mucus plugs or hemoptysis or cp or chest tightness, subjective wheeze or overt sinus or hb symptoms.      Also denies any obvious fluctuation of symptoms with weather or environmental changes or other aggravating or alleviating factors except as outlined above   No unusual exposure hx or h/o childhood pna/ asthma or knowledge of premature birth.  Current Allergies, Complete Past Medical History, Past Surgical History, Family History, and Social History were reviewed in [facility] record.  ROS  The following are not active complaints unless bolded Hoarseness, sore throat, dysphagia, dental problems, itching, sneezing,  nasal congestion or discharge of excess mucus or purulent secretions, ear ache,   fever, chills, sweats, unintended wt loss or wt gain, classically pleuritic or exertional cp,  orthopnea pnd or arm/hand swelling  or leg swelling, presyncope, palpitations, abdominal pain, anorexia, nausea, vomiting, diarrhea  or change in bowel habits or change in bladder habits, change in stools or change in urine, dysuria, hematuria,  rash, arthralgias, visual complaints, headache, numbness, weakness or ataxia or problems with walking or coordination,  change in mood or  memory.        Current Meds  Medication Sig  . acetaminophen (TYLENOL) 500 MG tablet Take 500 mg by mouth every 6 (six) hours as needed for moderate pain.  Marland Kitchen albuterol (VENTOLIN HFA) 108 (90 Base) MCG/ACT inhaler Inhale 1-2 puffs into the lungs every 6 (six) hours as needed for wheezing  or shortness of breath.  Marland Kitchen aspirin EC 81 MG tablet Take 81 mg by mouth daily.  . Budeson-Glycopyrrol-Formoterol (BREZTRI AEROSPHERE) 160-9-4.8 MCG/ACT AERO Inhale 2 puffs into the lungs in the morning and at bedtime. Take 2 puffs first thing in am and then another 2 puffs about 12 hours later. (Patient taking differently: Inhale 2 puffs into the lungs in the morning and at bedtime.)  . cholecalciferol (VITAMIN D3) 25 MCG (1000 UNIT) tablet Take 2,000 Units by mouth daily.  . clopidogrel (PLAVIX) 75 MG tablet Take 1 tablet (75  mg total) by mouth daily. PLEASE SCHEDULE APPOINTMENT FOR FURTHER REFILLS. 2ND ATTEMPT (Patient taking differently: Take 75 mg by mouth daily.)  . cyclobenzaprine (FLEXERIL) 10 MG tablet Take 1 tablet (10 mg total) by mouth 3 (three) times daily as needed for muscle spasms.  . DEXILANT 60 MG capsule TAKE 1 CAPSULE ONCE DAILY (Patient taking differently: Take 60 mg by mouth daily.)  . escitalopram (LEXAPRO) 10 MG tablet Take 10 mg by mouth daily.   . fenofibrate 160 MG tablet Take 160 mg by mouth daily.   . folic acid (FOLVITE) 1 MG tablet Take 1 mg by mouth daily.  . metoprolol tartrate (LOPRESSOR) 25 MG tablet Take 1 tablet (25 mg total) by mouth 2 (two) times daily as needed (afib).  . prochlorperazine (COMPAZINE) 10 MG tablet Take 1 tablet (10 mg total) by mouth every 6 (six) hours as needed for nausea or vomiting.  . Red Yeast Rice 600 MG CAPS Take 600 mg by mouth daily.   . rosuvastatin (CRESTOR) 20 MG tablet Take 20 mg by mouth daily.   . vitamin B-12 (CYANOCOBALAMIN) 1000 MCG tablet Take 1 tablet (1,000 mcg total) by mouth daily.             Past Medical History:  Diagnosis Date  . Anxiety   . Arthritis    "hands; spine too" (04/04/2012)  . Exertional dyspnea   . Folate deficiency 10/17/2015  . GERD (gastroesophageal reflux disease)   . Hyperlipemia   . Hypertension   . Macrocytosis 10/17/2015  . Neck pain, chronic   . PAD (peripheral artery disease) ([place])   . Renal artery stenosis ([place])   . Seasonal allergies   . Tobacco abuse          Objective:     Wt Readings from Last 3 Encounters:  10/10/20 140 lb (63.5 kg)  09/12/20 139 lb (63 kg)  09/02/20 141 lb (64 kg)   02/29/20          140    Vital signs reviewed  10/10/2020  - Note at rest 02 sats  99% on RA   General appearance:   pleasant amb wf nad       HEENT : pt wearing mask not removed for exam due to covid -19 concerns.    NECK :  without JVD/Nodes/TM/ nl carotid upstrokes bilaterally   LUNGS: no acc  muscle use,  Nl contour chest which is clear to A and P bilaterally without cough on insp or exp maneuvers   CV:  RRR  no s3 or murmur or increase in P2, and no edema   ABD:  soft and nontender with nl inspiratory excursion in the supine position. No bruits or organomegaly appreciated, bowel sounds nl  MS:  Nl gait/ ext warm without deformities, calf tenderness, cyanosis or clubbing No obvious joint restrictions   SKIN: warm and dry without lesions    NEURO:  alert, approp,  nl sensorium with  no motor or cerebellar deficits apparent.      I personally reviewed images and agree with radiology impression as follows:  CXR:   Pa and lateral 09/02/20 No acute cardiopulmonary process. Stable areas of scarring and/or postoperative change within the right suprahilar region and left lung base.     Assessment

## 2020-10-28 ENCOUNTER — Other Ambulatory Visit (HOSPITAL_COMMUNITY): Payer: Self-pay

## 2020-10-28 DIAGNOSIS — K2100 Gastro-esophageal reflux disease with esophagitis, without bleeding: Secondary | ICD-10-CM

## 2020-10-28 MED ORDER — DEXLANSOPRAZOLE 60 MG PO CPDR: 1.0000 | DELAYED_RELEASE_CAPSULE | Freq: Every day | ORAL | 1 refills | Status: DC

## 2020-11-03 ENCOUNTER — Ambulatory Visit (HOSPITAL_COMMUNITY)
Admission: RE | Admit: 2020-11-03 | Discharge: 2020-11-03 | Disposition: A | Discharge status: Home / Self Care | Payer: Commercial Managed Care - PPO | Source: Ambulatory Visit | Attending: Student | Admitting: Student

## 2020-11-03 ENCOUNTER — Other Ambulatory Visit: Payer: Self-pay

## 2020-11-03 ENCOUNTER — Other Ambulatory Visit (HOSPITAL_COMMUNITY): Payer: Self-pay | Admitting: Student

## 2020-11-03 DIAGNOSIS — R0602 Shortness of breath: Secondary | ICD-10-CM | POA: Diagnosis not present

## 2020-11-05 ENCOUNTER — Telehealth (HOSPITAL_COMMUNITY): Payer: Self-pay | Admitting: *Deleted

## 2020-11-05 NOTE — Telephone Encounter (Signed)
Received call from daughter requesting CT chest be moved up due to worsening sx.  Discussed with Dr. Delton Coombes and advised is ok to proceed.  CT changed to 5/9 and patient made aware.

## 2020-11-07 ENCOUNTER — Other Ambulatory Visit (HOSPITAL_COMMUNITY): Payer: Self-pay | Admitting: *Deleted

## 2020-11-07 DIAGNOSIS — K2100 Gastro-esophageal reflux disease with esophagitis, without bleeding: Secondary | ICD-10-CM

## 2020-11-07 DIAGNOSIS — E611 Iron deficiency: Secondary | ICD-10-CM

## 2020-11-07 DIAGNOSIS — C3491 Malignant neoplasm of unspecified part of right bronchus or lung: Secondary | ICD-10-CM

## 2020-11-07 DIAGNOSIS — E538 Deficiency of other specified B group vitamins: Secondary | ICD-10-CM

## 2020-11-07 MED ORDER — DEXLANSOPRAZOLE 60 MG PO CPDR: 1.0000 | DELAYED_RELEASE_CAPSULE | Freq: Every day | ORAL | 1 refills | Status: DC

## 2020-11-10 ENCOUNTER — Other Ambulatory Visit: Payer: Self-pay

## 2020-11-10 ENCOUNTER — Inpatient Hospital Stay (HOSPITAL_COMMUNITY): Payer: Commercial Managed Care - PPO | Attending: Hematology

## 2020-11-10 ENCOUNTER — Institutional Professional Consult (permissible substitution): Payer: Commercial Managed Care - PPO | Admitting: Pulmonary Disease

## 2020-11-17 ENCOUNTER — Ambulatory Visit (HOSPITAL_BASED_OUTPATIENT_CLINIC_OR_DEPARTMENT_OTHER): Admission: RE | Admit: 2020-11-17 | Payer: Commercial Managed Care - PPO | Source: Ambulatory Visit

## 2020-11-24 ENCOUNTER — Encounter: Payer: Self-pay | Admitting: *Deleted

## 2020-11-24 ENCOUNTER — Ambulatory Visit (HOSPITAL_COMMUNITY): Payer: Commercial Managed Care - PPO | Admitting: Hematology

## 2020-11-24 DIAGNOSIS — C3491 Malignant neoplasm of unspecified part of right bronchus or lung: Secondary | ICD-10-CM

## 2020-11-24 NOTE — Progress Notes (Signed)
I received a message that Connie Farrell would like her tx to be here in [place]. I updated new patient coordinator to call and schedule her to be seen on 5/18 with Dr. Julien Nordmann.

## 2020-11-25 ENCOUNTER — Other Ambulatory Visit (HOSPITAL_COMMUNITY): Payer: Commercial Managed Care - PPO

## 2020-12-02 ENCOUNTER — Ambulatory Visit (HOSPITAL_COMMUNITY): Payer: Commercial Managed Care - PPO | Admitting: Hematology

## 2020-12-03 ENCOUNTER — Other Ambulatory Visit: Payer: Self-pay

## 2020-12-03 ENCOUNTER — Inpatient Hospital Stay: Payer: Commercial Managed Care - PPO | Attending: Internal Medicine | Admitting: Internal Medicine

## 2020-12-03 ENCOUNTER — Inpatient Hospital Stay: Payer: Commercial Managed Care - PPO

## 2020-12-03 ENCOUNTER — Encounter: Payer: Self-pay | Admitting: Internal Medicine

## 2020-12-03 VITALS — BP 143/89 | HR 74 | Temp 97.2°F | Resp 19 | Ht 64.0 in | Wt 135.3 lb

## 2020-12-03 DIAGNOSIS — F1290 Cannabis use, unspecified, uncomplicated: Secondary | ICD-10-CM | POA: Insufficient documentation

## 2020-12-03 DIAGNOSIS — M1990 Unspecified osteoarthritis, unspecified site: Secondary | ICD-10-CM | POA: Insufficient documentation

## 2020-12-03 DIAGNOSIS — K219 Gastro-esophageal reflux disease without esophagitis: Secondary | ICD-10-CM | POA: Insufficient documentation

## 2020-12-03 DIAGNOSIS — Z79899 Other long term (current) drug therapy: Secondary | ICD-10-CM | POA: Insufficient documentation

## 2020-12-03 DIAGNOSIS — E785 Hyperlipidemia, unspecified: Secondary | ICD-10-CM | POA: Diagnosis not present

## 2020-12-03 DIAGNOSIS — C3490 Malignant neoplasm of unspecified part of unspecified bronchus or lung: Secondary | ICD-10-CM

## 2020-12-03 DIAGNOSIS — F419 Anxiety disorder, unspecified: Secondary | ICD-10-CM | POA: Insufficient documentation

## 2020-12-03 DIAGNOSIS — C342 Malignant neoplasm of middle lobe, bronchus or lung: Secondary | ICD-10-CM | POA: Insufficient documentation

## 2020-12-03 DIAGNOSIS — R634 Abnormal weight loss: Secondary | ICD-10-CM | POA: Diagnosis not present

## 2020-12-03 DIAGNOSIS — R058 Other specified cough: Secondary | ICD-10-CM | POA: Insufficient documentation

## 2020-12-03 DIAGNOSIS — R5383 Other fatigue: Secondary | ICD-10-CM | POA: Insufficient documentation

## 2020-12-03 DIAGNOSIS — I10 Essential (primary) hypertension: Secondary | ICD-10-CM | POA: Insufficient documentation

## 2020-12-03 DIAGNOSIS — I701 Atherosclerosis of renal artery: Secondary | ICD-10-CM | POA: Diagnosis not present

## 2020-12-03 DIAGNOSIS — R630 Anorexia: Secondary | ICD-10-CM | POA: Insufficient documentation

## 2020-12-03 DIAGNOSIS — J449 Chronic obstructive pulmonary disease, unspecified: Secondary | ICD-10-CM | POA: Insufficient documentation

## 2020-12-03 DIAGNOSIS — Z87891 Personal history of nicotine dependence: Secondary | ICD-10-CM | POA: Diagnosis not present

## 2020-12-03 DIAGNOSIS — Z7982 Long term (current) use of aspirin: Secondary | ICD-10-CM | POA: Insufficient documentation

## 2020-12-03 DIAGNOSIS — R112 Nausea with vomiting, unspecified: Secondary | ICD-10-CM | POA: Insufficient documentation

## 2020-12-03 DIAGNOSIS — Z9049 Acquired absence of other specified parts of digestive tract: Secondary | ICD-10-CM | POA: Diagnosis not present

## 2020-12-03 DIAGNOSIS — C3491 Malignant neoplasm of unspecified part of right bronchus or lung: Secondary | ICD-10-CM | POA: Diagnosis not present

## 2020-12-03 LAB — CMP (CANCER CENTER ONLY)
ALT: 31 U/L (ref 0–44)
AST: 22 U/L (ref 15–41)
Albumin: 3.7 g/dL (ref 3.5–5.0)
Alkaline Phosphatase: 128 U/L — ABNORMAL HIGH (ref 38–126)
Anion gap: 9 (ref 5–15)
BUN: 10 mg/dL (ref 6–20)
CO2: 23 mmol/L (ref 22–32)
Calcium: 9.6 mg/dL (ref 8.9–10.3)
Chloride: 108 mmol/L (ref 98–111)
Creatinine: 0.87 mg/dL (ref 0.44–1.00)
GFR, Estimated: >60 mL/min (ref >60)
Glucose, Bld: 84 mg/dL (ref 70–99)
Potassium: 4 mmol/L (ref 3.5–5.1)
Sodium: 140 mmol/L (ref 135–145)
Total Bilirubin: 0.3 mg/dL (ref 0.3–1.2)
Total Protein: 7.1 g/dL (ref 6.5–8.1)

## 2020-12-03 LAB — CBC WITH DIFFERENTIAL (CANCER CENTER ONLY)
Abs Immature Granulocytes: 0.01 10*3/uL (ref 0.00–0.07)
Basophils Absolute: 0 10*3/uL (ref 0.0–0.1)
Basophils Relative: 1 %
Eosinophils Absolute: 0.2 10*3/uL (ref 0.0–0.5)
Eosinophils Relative: 4 %
HCT: 41.4 % (ref 36.0–46.0)
Hemoglobin: 13.7 g/dL (ref 12.0–15.0)
Immature Granulocytes: 0 %
Lymphocytes Relative: 25 %
Lymphs Abs: 1.3 10*3/uL (ref 0.7–4.0)
MCH: 33.3 pg (ref 26.0–34.0)
MCHC: 33.1 g/dL (ref 30.0–36.0)
MCV: 100.5 fL — ABNORMAL HIGH (ref 80.0–100.0)
Monocytes Absolute: 0.3 10*3/uL (ref 0.1–1.0)
Monocytes Relative: 6 %
Neutro Abs: 3.3 10*3/uL (ref 1.7–7.7)
Neutrophils Relative %: 64 %
Platelet Count: 189 10*3/uL (ref 150–400)
RBC: 4.12 MIL/uL (ref 3.87–5.11)
RDW: 15.2 % (ref 11.5–15.5)
WBC Count: 5.2 10*3/uL (ref 4.0–10.5)
nRBC: 0 % (ref 0.0–0.2)

## 2020-12-03 NOTE — Progress Notes (Signed)
[place] [phone]   [phone]  CONSULT NOTE  REFERRING PHYSICIAN: Dr. Modesto Charon  REASON FOR CONSULTATION:  60 years old white female recently diagnosed with lung cancer.  HPI Connie Farrell is a 60 y.o. female with past medical history significant for multiple medical problems including history of hypertension, COPD, dyslipidemia, chronic neck pain, GERD, osteoarthritis, and anxiety as well as renal artery stenosis and long history of smoking.  The patient mentioned that on April 03, 2020 she had CT screening of the chest that showed suspicious inferior right upper lobe lung nodule concerning for lung neoplasm.  This was followed by a PET scan which showed the right upper lobe pulmonary nodule of concern is hypermetabolic with SUV max of 5.6.  There was dense masslike consolidation opacity in the anterior left lower lobe that is also markedly hypermetabolic with SUV max of 00.7 and a hypermetabolic nodule seen just medial to this dominant lesion is hypermetabolic.  The left lung lesion was suspicious for inflammatory process taking into consideration the rapid interval appearance.  There was 0.7 cm short axis paraesophageal lymph node with low-level hypermetabolism with SUV max of 4.7 and focal hypermetabolism in the left hilum with SUV max of 4.0.  Patient was referred to [name] and on April 28, 2020 she underwent bronchoscopy with biopsy of the suspicious nodules but the final pathology was not conclusive for malignancy.  She had repeat CT scan of the chest without contrast on June 03, 2020 and it showed no significant change in the size of the right lung nodules and there was decrease in the size of the subpleural consolidation within the anterior left lower lobe likely reflecting resolving infection.  There was similar appearance of the diffuse groundglass and ill-defined centrilobular micronodule Arity that were nonspecific.   On June 16, 2021 the patient underwent electromagnetic navigation bronchoscopy for tumor marking in addition to robotic right thoracoscopy with right upper lobe anterior segmentectomy and right middle lobe wedge resection as well as mediastinal lymph node dissection under the care of [name]. The final pathology [id number]) showed invasive adenocarcinoma well differentiated measuring 0.8 cm from the right middle lobe in addition to invasive moderately differentiated adenocarcinoma measuring 1.7 cm from the right upper lobe anterior segmentectomy.  There was carcinoma involved with the subpleural connective tissue.  The resection margins were negative for malignancy.  There was evidence of metastatic carcinoma involving 1 lymph node at level 13. The patient was seen by Dr. Delton Coombes at [facility] and he offered the patient adjuvant treatment with carboplatin and Alimta but the patient declined the treatment.  Her last CT scan of the chest was performed on August 01, 2020 and that showed the interval resection of the right upper and middle lobe pulmonary lesions there was a 1.0 cm short axis inferior left hilar node that was not evident on the previous noncontrast CT and represent the lymph node showing hypermetabolic FDG on the previous PET scan and close follow-up was recommended.  The dense anterior left lower lobe consolidative opacity is similar to minimally decreased in the interval.  And the additional scattered bilateral pulmonary nodules were stable.  The patient decided to move her care to [facility] and she was referred to me today for evaluation and recommendation regarding her condition. When seen today she continues to have pain episodes on the right back as well as shortness of breath with exertion and cough productive of whitish-greenish sputum.  She has no hemoptysis.  She has some weight loss secondary to lack of appetite.  She denied having any visual changes but  has intermittent headache as well as occasional nausea/vomiting but no diarrhea or constipation. Family history significant for father with leukemia, mother had heart disease. The patient is married and has 2 daughters she lives in [place].  She used to work in [employer].  She was accompanied today by her husband [name].  She has a history of smoking more than 1 pack/day for around 40 years and quit in November 2021.  She does not drink alcohol but she also uses marijuana occasionally. HPI  Past Medical History:  Diagnosis Date  . Anxiety   . Arthritis    "hands; spine too" (04/04/2012)  . COPD (chronic obstructive pulmonary disease) ([place])   . Exertional dyspnea   . Folate deficiency 10/17/2015  . GERD (gastroesophageal reflux disease)   . Hyperlipemia   . Hypertension   . Macrocytosis 10/17/2015  . Neck pain, chronic   . PAD (peripheral artery disease) ([place])   . Renal artery stenosis ([place])   . Seasonal allergies   . Tobacco abuse     Past Surgical History:  Procedure Laterality Date  . ABDOMINAL ANGIOGRAM  04/04/2012   Procedure: ABDOMINAL ANGIOGRAM;  Surgeon: Lorretta Harp, MD;  Location: [facility] CATH LAB;  Service: Cardiovascular;;  . ANTERIOR CERVICAL DECOMP/DISCECTOMY FUSION  ~ 04/2006   C6, 7, T1  . APPENDECTOMY    . COLONOSCOPY N/A 02/26/2014   Adequate preparation. Minimal anal canal hemorrhoids;otherwise, normal rectumdiminutive polyps in the middescending segment; otherwise, the remainder of colonic mucosaappeared normal. Hyperplastic polyps.  . ESOPHAGOGASTRODUODENOSCOPY N/A 02/26/2014   XTG:GYIRSWNI distal esophagus, query short segment Barrett's. Status post Venia Minks dilation with esophageal biopsy. Abnormal gastric mucosa. Esophageal biopsy negative for Barrett's. Gastric biopsy showed mild chronic gastritis, no H pylori.  Dossie Der NERVE BLOCK Right 06/16/2020   Procedure: INTERCOSTAL NERVE BLOCK;  Surgeon: [name], MD;  Location: [facility];  Service:  Thoracic;  Laterality: Right;  . LAPAROSCOPIC APPENDECTOMY N/A 09/10/2013   Procedure: APPENDECTOMY LAPAROSCOPIC;  Surgeon: Jamesetta So, MD;  Location: [facility] ORS;  Service: General;  Laterality: N/A;  . LOWER EXTREMITY ANGIOGRAM N/A 04/04/2012   Procedure: LOWER EXTREMITY ANGIOGRAM;  Surgeon: Lorretta Harp, MD;  Location: [facility] CATH LAB;  Service: Cardiovascular;  Laterality: N/A;  . Venia Minks DILATION N/A 02/26/2014   Procedure: Venia Minks DILATION;  Surgeon: Daneil Dolin, MD;  Location: [facility] ENDO SUITE;  Service: Endoscopy;  Laterality: N/A;  . NODE DISSECTION Right 06/16/2020   Procedure: NODE DISSECTION;  Surgeon: [name], MD;  Location: [facility];  Service: Thoracic;  Laterality: Right;  . PERIPHERAL ARTERIAL STENT GRAFT  04/04/2012  . RENAL ANGIOGRAM Left 04/22/2014   Procedure: RENAL ANGIOGRAM;  Surgeon: Lorretta Harp, MD;  Location: [facility] CATH LAB;  Service: Cardiovascular;  Laterality: Left;  . TUBAL LIGATION  1990  . VASCULAR SURGERY  03/31/2016   Peripheral arterial stent  . VIDEO BRONCHOSCOPY WITH ENDOBRONCHIAL NAVIGATION N/A 04/28/2020   Procedure: VIDEO BRONCHOSCOPY WITH ENDOBRONCHIAL NAVIGATION;  Surgeon: [name], MD;  Location: [place];  Service: Thoracic;  Laterality: N/A;  . VIDEO BRONCHOSCOPY WITH ENDOBRONCHIAL NAVIGATION N/A 06/16/2020   Procedure: VIDEO BRONCHOSCOPY WITH ENDOBRONCHIAL NAVIGATION;  Surgeon: [name], MD;  Location: [facility] OR;  Service: Thoracic;  Laterality: N/A;    Family History  Problem Relation Age of Onset  . Leukemia Father   .  Cancer Father   . Heart disease Father   . Heart disease Mother   . Heart disease Other   . Arthritis Other   . Asthma Other   . Colon cancer Other        unknown, possibly father    Social History Social History   Tobacco Use  . Smoking status: Former Smoker    Packs/day: 1.00    Years: 44.00    Pack years: 44.00    Types: Cigarettes    Quit date: 06/15/2020    Years since quitting:  0.4  . Smokeless tobacco: Never Used  Vaping Use  . Vaping Use: Never used  Substance Use Topics  . Alcohol use: No  . Drug use: No    Allergies  Allergen Reactions  . Latex Rash    Blisters  . Codeine Nausea And Vomiting    Current Outpatient Medications  Medication Sig Dispense Refill  . acetaminophen (TYLENOL) 500 MG tablet Take 500 mg by mouth every 6 (six) hours as needed for moderate pain.    Marland Kitchen albuterol (VENTOLIN HFA) 108 (90 Base) MCG/ACT inhaler Inhale 1-2 puffs into the lungs every 6 (six) hours as needed for wheezing or shortness of breath.    Marland Kitchen amLODipine (NORVASC) 5 MG tablet Take 1 tablet (5 mg total) by mouth daily. 180 tablet 3  . aspirin EC 81 MG tablet Take 81 mg by mouth daily.    . Budeson-Glycopyrrol-Formoterol (BREZTRI AEROSPHERE) 160-9-4.8 MCG/ACT AERO Inhale 2 puffs into the lungs in the morning and at bedtime. Take 2 puffs first thing in am and then another 2 puffs about 12 hours later. (Patient taking differently: Inhale 2 puffs into the lungs in the morning and at bedtime.) 10.7 g 11  . cholecalciferol (VITAMIN D3) 25 MCG (1000 UNIT) tablet Take 2,000 Units by mouth daily.    . clopidogrel (PLAVIX) 75 MG tablet Take 1 tablet (75 mg total) by mouth daily. PLEASE SCHEDULE APPOINTMENT FOR FURTHER REFILLS. 2ND ATTEMPT (Patient taking differently: Take 75 mg by mouth daily.) 15 tablet 0  . cyclobenzaprine (FLEXERIL) 10 MG tablet Take 1 tablet (10 mg total) by mouth 3 (three) times daily as needed for muscle spasms. 30 tablet 1  . dexlansoprazole (DEXILANT) 60 MG capsule Take 1 capsule (60 mg total) by mouth daily. 90 capsule 1  . escitalopram (LEXAPRO) 10 MG tablet Take 10 mg by mouth daily.     . fenofibrate 160 MG tablet Take 160 mg by mouth daily.     . folic acid (FOLVITE) 1 MG tablet Take 1 mg by mouth daily.    . metoprolol tartrate (LOPRESSOR) 25 MG tablet Take 1 tablet (25 mg total) by mouth 2 (two) times daily as needed (afib). 60 tablet 0  .  prochlorperazine (COMPAZINE) 10 MG tablet Take 1 tablet (10 mg total) by mouth every 6 (six) hours as needed for nausea or vomiting. 30 tablet 3  . Red Yeast Rice 600 MG CAPS Take 600 mg by mouth daily.     . rosuvastatin (CRESTOR) 20 MG tablet Take 20 mg by mouth daily.     . vitamin B-12 (CYANOCOBALAMIN) 1000 MCG tablet Take 1 tablet (1,000 mcg total) by mouth daily. 60 tablet 2   No current facility-administered medications for this visit.    Review of Systems  Constitutional: positive for fatigue Eyes: negative Ears, nose, mouth, throat, and face: negative Respiratory: positive for cough, dyspnea on exertion and sputum Cardiovascular: negative Gastrointestinal: positive for nausea and  vomiting Genitourinary:negative Integument/breast: negative Hematologic/lymphatic: negative Musculoskeletal:positive for back pain Neurological: negative Behavioral/Psych: negative Endocrine: negative Allergic/Immunologic: negative  Physical Exam  PPJ:KDTOI, healthy, no distress, well nourished, well developed and anxious SKIN: skin color, texture, turgor are normal, no rashes or significant lesions HEAD: Normocephalic, No masses, lesions, tenderness or abnormalities EYES: normal, PERRLA, Conjunctiva are pink and non-injected EARS: External ears normal, Canals clear OROPHARYNX:no exudate, no erythema and lips, buccal mucosa, and tongue normal  NECK: supple, no adenopathy, no JVD LYMPH:  no palpable lymphadenopathy, no hepatosplenomegaly BREAST:not examined LUNGS: clear to auscultation , and palpation HEART: regular rate & rhythm, no murmurs and no gallops ABDOMEN:abdomen soft, non-tender, normal bowel sounds and no masses or organomegaly BACK: No CVA tenderness, Range of motion is normal EXTREMITIES:no joint deformities, effusion, or inflammation, no edema  NEURO: alert & oriented x 3 with fluent speech, no focal motor/sensory deficits  PERFORMANCE STATUS: ECOG 1  LABORATORY DATA: Lab  Results  Component Value Date   WBC 5.2 12/03/2020   HGB 13.7 12/03/2020   HCT 41.4 12/03/2020   MCV 100.5 (H) 12/03/2020   PLT 189 12/03/2020      Chemistry      Component Value Date/Time   NA 133 (L) 08/07/2020 1304   K 4.8 08/07/2020 1304   CL 101 08/07/2020 1304   CO2 25 08/07/2020 1304   BUN 23 (H) 08/07/2020 1304   CREATININE 1.13 (H) 08/07/2020 1304   CREATININE 1.36 (H) 04/16/2014 1034      Component Value Date/Time   CALCIUM 9.9 08/07/2020 1304   ALKPHOS 62 08/07/2020 1304   AST 24 08/07/2020 1304   ALT 26 08/07/2020 1304   BILITOT 0.4 08/07/2020 1304       RADIOGRAPHIC STUDIES: DG Chest 2 View  Result Date: 11/03/2020 CLINICAL DATA:  Shortness of breath chest pain EXAM: CHEST - 2 VIEW COMPARISON:  09/02/2020, CT 08/01/2020 FINDINGS: Postsurgical changes in the right hilar region. Linear atelectasis or scarring in the left lower lung. No pleural effusion or pneumothorax. Stable cardiomediastinal silhouette IMPRESSION: No active cardiopulmonary disease. Postsurgical changes on the right Electronically Signed   By: Donavan Foil M.D.   On: 11/03/2020 16:11    ASSESSMENT: This is a very pleasant 60 years old white female diagnosed with stage IIIa (T4, N1, M0) non-small cell lung cancer, adenocarcinoma in November 2021 status post right upper lobe segmentectomy in addition to wedge resection of the right middle lobe with lymph node dissection.   PLAN: I had a lengthy discussion with the patient and her husband today about her current disease stage, prognosis and treatment options.  I personally and independently reviewed all the previous imaging studies as well as the pathology report and discussed it with the patient and her husband. Her surgery was in November 2021 and unfortunately there will be no role for adjuvant chemotherapy at this point.  The patient was offered adjuvant treatment by Dr. Delton Coombes but she declined at that time. I recommended for the patient to  continue on observation with repeat CT scan of the chest in addition to MRI of the brain in 1 months for restaging of her disease and to rule out any disease recurrence or metastasis. I strongly recommend for the patient to continue with the smoking cessation. She was advised to call immediately if she has any other concerning symptoms in the interval.  The patient voices understanding of current disease status and treatment options and is in agreement with the current care plan.  All  questions were answered. The patient knows to call the clinic with any problems, questions or concerns. We can certainly see the patient much sooner if necessary.  Thank you so much for allowing me to participate in the care of Connie Farrell. I will continue to follow up the patient with you and assist in her care.  The total time spent in the appointment was 60 minutes.  Disclaimer: This note was dictated with voice recognition software. Similar sounding words can inadvertently be transcribed and may not be corrected upon review.   Eilleen Kempf Dec 03, 2020, 2:12 PM

## 2020-12-04 ENCOUNTER — Telehealth: Payer: Self-pay | Admitting: Internal Medicine

## 2020-12-04 NOTE — Telephone Encounter (Signed)
Scheduled per los. Called and left msg. Mailed printout  °

## 2020-12-08 ENCOUNTER — Other Ambulatory Visit (HOSPITAL_COMMUNITY): Payer: Self-pay | Admitting: Internal Medicine

## 2020-12-08 DIAGNOSIS — Z1231 Encounter for screening mammogram for malignant neoplasm of breast: Secondary | ICD-10-CM

## 2020-12-09 ENCOUNTER — Ambulatory Visit: Payer: Commercial Managed Care - PPO | Admitting: Thoracic Surgery (Cardiothoracic Vascular Surgery)

## 2020-12-28 ENCOUNTER — Other Ambulatory Visit: Payer: Self-pay

## 2020-12-28 ENCOUNTER — Encounter (HOSPITAL_COMMUNITY): Payer: Self-pay

## 2020-12-28 ENCOUNTER — Emergency Department (HOSPITAL_COMMUNITY): Payer: Commercial Managed Care - PPO

## 2020-12-28 ENCOUNTER — Emergency Department (HOSPITAL_COMMUNITY)
Admission: EM | Admit: 2020-12-28 | Discharge: 2020-12-28 | Disposition: A | Discharge status: Home / Self Care | Payer: Commercial Managed Care - PPO | Attending: Emergency Medicine | Admitting: Emergency Medicine

## 2020-12-28 DIAGNOSIS — Z7982 Long term (current) use of aspirin: Secondary | ICD-10-CM | POA: Diagnosis not present

## 2020-12-28 DIAGNOSIS — J449 Chronic obstructive pulmonary disease, unspecified: Secondary | ICD-10-CM | POA: Insufficient documentation

## 2020-12-28 DIAGNOSIS — Z87891 Personal history of nicotine dependence: Secondary | ICD-10-CM | POA: Insufficient documentation

## 2020-12-28 DIAGNOSIS — I10 Essential (primary) hypertension: Secondary | ICD-10-CM | POA: Diagnosis not present

## 2020-12-28 DIAGNOSIS — Z79899 Other long term (current) drug therapy: Secondary | ICD-10-CM | POA: Diagnosis not present

## 2020-12-28 DIAGNOSIS — M48061 Spinal stenosis, lumbar region without neurogenic claudication: Secondary | ICD-10-CM | POA: Diagnosis not present

## 2020-12-28 DIAGNOSIS — Z7902 Long term (current) use of antithrombotics/antiplatelets: Secondary | ICD-10-CM | POA: Diagnosis not present

## 2020-12-28 DIAGNOSIS — Z91040 Latex allergy status: Secondary | ICD-10-CM | POA: Diagnosis not present

## 2020-12-28 DIAGNOSIS — M545 Low back pain, unspecified: Secondary | ICD-10-CM | POA: Diagnosis present

## 2020-12-28 DIAGNOSIS — R350 Frequency of micturition: Secondary | ICD-10-CM | POA: Diagnosis not present

## 2020-12-28 DIAGNOSIS — Z85118 Personal history of other malignant neoplasm of bronchus and lung: Secondary | ICD-10-CM | POA: Insufficient documentation

## 2020-12-28 DIAGNOSIS — K3580 Unspecified acute appendicitis: Secondary | ICD-10-CM | POA: Insufficient documentation

## 2020-12-28 HISTORY — DX: Disorder of kidney and ureter, unspecified: N28.9

## 2020-12-28 LAB — CBC WITH DIFFERENTIAL/PLATELET
Abs Immature Granulocytes: 0.03 10*3/uL (ref 0.00–0.07)
Basophils Absolute: 0.1 10*3/uL (ref 0.0–0.1)
Basophils Relative: 1 %
Eosinophils Absolute: 0.2 10*3/uL (ref 0.0–0.5)
Eosinophils Relative: 4 %
HCT: 39.4 % (ref 36.0–46.0)
Hemoglobin: 13 g/dL (ref 12.0–15.0)
Immature Granulocytes: 1 %
Lymphocytes Relative: 12 %
Lymphs Abs: 0.7 10*3/uL (ref 0.7–4.0)
MCH: 34 pg (ref 26.0–34.0)
MCHC: 33 g/dL (ref 30.0–36.0)
MCV: 103.1 fL — ABNORMAL HIGH (ref 80.0–100.0)
Monocytes Absolute: 0.4 10*3/uL (ref 0.1–1.0)
Monocytes Relative: 6 %
Neutro Abs: 4.5 10*3/uL (ref 1.7–7.7)
Neutrophils Relative %: 76 %
Platelets: 182 10*3/uL (ref 150–400)
RBC: 3.82 MIL/uL — ABNORMAL LOW (ref 3.87–5.11)
RDW: 14.5 % (ref 11.5–15.5)
WBC: 5.9 10*3/uL (ref 4.0–10.5)
nRBC: 0 % (ref 0.0–0.2)

## 2020-12-28 LAB — URINALYSIS, ROUTINE W REFLEX MICROSCOPIC
Bilirubin Urine: NEGATIVE
Glucose, UA: NEGATIVE mg/dL
Ketones, ur: NEGATIVE mg/dL
Leukocytes,Ua: NEGATIVE
Nitrite: NEGATIVE
Protein, ur: NEGATIVE mg/dL
Specific Gravity, Urine: 1.011 (ref 1.005–1.030)
pH: 5 (ref 5.0–8.0)

## 2020-12-28 LAB — BASIC METABOLIC PANEL
Anion gap: 6 (ref 5–15)
BUN: 7 mg/dL (ref 6–20)
CO2: 26 mmol/L (ref 22–32)
Calcium: 9 mg/dL (ref 8.9–10.3)
Chloride: 97 mmol/L — ABNORMAL LOW (ref 98–111)
Creatinine, Ser: 0.97 mg/dL (ref 0.44–1.00)
GFR, Estimated: >60 mL/min (ref >60)
Glucose, Bld: 113 mg/dL — ABNORMAL HIGH (ref 70–99)
Potassium: 4.6 mmol/L (ref 3.5–5.1)
Sodium: 129 mmol/L — ABNORMAL LOW (ref 135–145)

## 2020-12-28 MED ORDER — SODIUM CHLORIDE 0.9 % IV BOLUS
1000.0000 mL | Freq: Once | INTRAVENOUS | Status: AC
  Administered 2020-12-28: 1000 mL via INTRAVENOUS

## 2020-12-28 MED ORDER — PREDNISONE 10 MG (21) PO TBPK: ORAL_TABLET | Freq: Every day | ORAL | 0 refills | Status: DC

## 2020-12-28 MED ORDER — OXYCODONE-ACETAMINOPHEN 5-325 MG PO TABS
1.0000 | ORAL_TABLET | Freq: Once | ORAL | Status: AC
  Administered 2020-12-28: 1 via ORAL
  Filled 2020-12-28: qty 1

## 2020-12-28 MED ORDER — OXYCODONE-ACETAMINOPHEN 5-325 MG PO TABS: 1.0000 | ORAL_TABLET | Freq: Four times a day (QID) | ORAL | 0 refills | Status: DC | PRN

## 2020-12-28 MED ORDER — LIDOCAINE 5 % EX PTCH
1.0000 | MEDICATED_PATCH | CUTANEOUS | Status: DC
  Administered 2020-12-28: 1 via TRANSDERMAL
  Filled 2020-12-28: qty 1

## 2020-12-28 MED ORDER — KETOROLAC TROMETHAMINE 30 MG/ML IJ SOLN
30.0000 mg | Freq: Once | INTRAMUSCULAR | Status: AC
  Administered 2020-12-28: 30 mg via INTRAVENOUS
  Filled 2020-12-28: qty 1

## 2020-12-28 NOTE — ED Triage Notes (Signed)
Pt presents to ED with complaints of left lower back pain increased this am.

## 2020-12-28 NOTE — Discharge Instructions (Addendum)
Please pick up medication and take as prescribed. Discontinue the Toradol you have been prescribed yesterday.  Do not take Ibuprofen with the prednisone as it can cause GI upset.   You can also buy SalonPas patches OTC to help with pain  Follow up with Dr. Nevada Crane for further evaluation  Return to the ED for any new/worsening symptoms

## 2020-12-28 NOTE — ED Provider Notes (Signed)
[facility] EMERGENCY DEPARTMENT Provider Note   CSN: [id number] Arrival date & time: 12/28/20  0855     History Chief Complaint  Patient presents with   Back Pain    Connie Farrell is a 60 y.o. 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 Pt denies any trauma to the back. Pt reports she think it is her "kidneys" causing the pain as she was recently told she has stage II kidney disease. Pt was taking Ibuprofen/Tylenol for pain and then switched to muscle relaxers with mild relief however today nothing was working prompting her to come to the ED. She also mentions urinary frequency recently. She states that the pain seemed to radiate into her leg yesterday but also feels like it is radiating into her abdomen. She has been nauseated as well however states she will become nauseated whenever she is in severe pain. She denies fevers, chills, vomiting, diarrhea, constipation, dysuria, hematuria, or any other associated symptoms.   The history is provided by the patient and medical records.      Past Medical History:  Diagnosis Date   Anxiety    Arthritis    "hands; spine too" (04/04/2012)   COPD (chronic obstructive pulmonary disease) (HCC)    Exertional dyspnea    Folate deficiency 10/17/2015   GERD (gastroesophageal reflux disease)    Hyperlipemia    Hypertension    Macrocytosis 10/17/2015   Neck pain, chronic    PAD (peripheral artery disease) (HCC)    Renal artery stenosis (HCC)    Renal disorder    Seasonal allergies    Tobacco abuse     Patient Active Problem List   Diagnosis Date Noted   S/P robot-assisted surgical procedure 09/02/2020   Non-small cell carcinoma of lung, stage 3, right ([place]) 07/29/2020   Lung mass 06/16/2020   COPD GOLD 0 / Group D/ still smoking   02/29/2020   Lung nodule 02/29/2020   Cigarette smoker 02/29/2020   B12 deficiency 03/22/2019   Palpitations 02/17/2016   Macrocytosis 10/17/2015   Folate deficiency 10/17/2015   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   GERD (gastroesophageal reflux disease) 06/04/2014   Hoarseness 06/04/2014   Iron deficiency 05/30/2014   Heart palpitations 05/20/2014   Dysphagia, unspecified(787.20) 01/28/2014   RLQ abdominal pain 01/28/2014   Renal artery stenosis (HCC) 10/16/2013   Acute appendicitis 09/10/2013   Perforated appendicitis 09/10/2013   PAD (peripheral artery disease) ([place]) 04/05/2012   HTN (hypertension) 04/05/2012   Tobacco abuse 04/05/2012   HLD (hyperlipidemia) 04/05/2012   Claudication ([place]) 04/05/2012   Ankle fracture 06/29/2011   Fracture, ankle 06/07/2011    Past Surgical History:  Procedure Laterality Date   ABDOMINAL ANGIOGRAM  04/04/2012   Procedure: ABDOMINAL ANGIOGRAM;  Surgeon: Lorretta Harp, MD;  Location: [facility] CATH LAB;  Service: Cardiovascular;;   ANTERIOR CERVICAL DECOMP/DISCECTOMY FUSION  ~ 04/2006   C6, 7, T1   APPENDECTOMY     COLONOSCOPY N/A 02/26/2014   Adequate preparation. Minimal anal canal hemorrhoids;otherwise, normal rectumdiminutive polyps in the middescending segment; otherwise, the remainder of colonic mucosaappeared normal. Hyperplastic polyps.   ESOPHAGOGASTRODUODENOSCOPY N/A 02/26/2014   ZSW:FUXNATFT distal esophagus, query short segment Barrett's. Status post Venia Minks dilation with esophageal biopsy. Abnormal gastric mucosa. Esophageal biopsy negative  for Barrett's. Gastric biopsy showed mild chronic gastritis, no H pylori.   INTERCOSTAL NERVE BLOCK Right 06/16/2020   Procedure: INTERCOSTAL NERVE BLOCK;  Surgeon: [name], MD;  Location: [place];  Service: Thoracic;  Laterality: Right;   LAPAROSCOPIC APPENDECTOMY N/A 09/10/2013   Procedure: APPENDECTOMY LAPAROSCOPIC;  Surgeon: Jamesetta So, MD;  Location: [facility] ORS;  Service: General;  Laterality: N/A;   LOWER EXTREMITY ANGIOGRAM N/A 04/04/2012   Procedure: LOWER EXTREMITY ANGIOGRAM;  Surgeon: Lorretta Harp, MD;  Location: [facility] CATH LAB;  Service: Cardiovascular;  Laterality: N/A;   MALONEY DILATION N/A 02/26/2014   Procedure: Venia Minks DILATION;  Surgeon: Daneil Dolin, MD;  Location: [facility] ENDO SUITE;  Service: Endoscopy;  Laterality: N/A;   NODE DISSECTION Right 06/16/2020   Procedure: NODE DISSECTION;  Surgeon: [name], MD;  Location: [place];  Service: Thoracic;  Laterality: Right;   PERIPHERAL ARTERIAL STENT GRAFT  04/04/2012   RENAL ANGIOGRAM Left 04/22/2014   Procedure: RENAL ANGIOGRAM;  Surgeon: Lorretta Harp, MD;  Location: [facility] C[facility] LAB;  Service: Cardiovascular;  Laterality: Left;   TUBAL LIGATION  1990   VASCULAR SURGERY  03/31/2016   Peripheral arterial stent   VIDEO BRONCHOSCOPY WITH ENDOBRONCHIAL NAVIGATION N/A 04/28/2020   Procedure: VIDEO BRONCHOSCOPY WITH ENDOBRONCHIAL NAVIGATION;  Surgeon: [name], MD;  Location: [place];  Service: Thoracic;  Laterality: N/A;   VIDEO BRONCHOSCOPY WITH ENDOBRONCHIAL NAVIGATION N/A 06/16/2020   Procedure: VIDEO BRONCHOSCOPY WITH ENDOBRONCHIAL NAVIGATION;  Surgeon: [name], MD;  Location: [place];  Service: Thoracic;  Laterality: N/A;     OB History     Gravida  2   Para  2   Term  2   Preterm      AB      Living  2      SAB      IAB      Ectopic      Multiple      Live Births              Family History  Problem Relation Age of Onset   Leukemia Father    Cancer Father    Heart disease Father    Heart disease Mother    Heart disease Other    Arthritis Other    Asthma Other    Colon cancer Other        unknown, possibly father    Social History   Tobacco Use   Smoking status: Former    Packs/day: 1.00    Years: 44.00    Pack years: 44.00    Types: Cigarettes    Quit date: 06/15/2020    Years since  quitting: 0.5   Smokeless tobacco: Never  Vaping Use   Vaping Use: Never used  Substance Use Topics   Alcohol use: No   Drug use: No    Home Medications Prior to Admission medications   Medication Sig Start Date End Date Taking? Authorizing Provider  oxyCODONE-acetaminophen (PERCOCET/ROXICET) 5-325 MG tablet Take 1 tablet by mouth every 6 (six) hours as needed for severe pain. 12/28/20  Yes Promyse Ardito, PA-C  predniSONE (STERAPRED UNI-PAK 21 TAB) 10 MG (21) TBPK tablet Take by mouth daily. Follow package insert 12/28/20  Yes Alroy Bailiff, Mikko Lewellen, PA-C  acetaminophen (TYLENOL) 500 MG tablet Take 500 mg by mouth every 6 (six) hours as needed for moderate pain.    [provider]  albuterol (VENTOLIN HFA) 108 (90 Base) MCG/ACT inhaler Inhale  1-2 puffs into the lungs every 6 (six) hours as needed for wheezing or shortness of breath.    [provider]  amLODipine (NORVASC) 5 MG tablet Take 1 tablet (5 mg total) by mouth daily. 03/19/20 06/17/20  Lorretta Harp, MD  aspirin EC 81 MG tablet Take 81 mg by mouth daily.    [provider]  Budeson-Glycopyrrol-Formoterol (BREZTRI AEROSPHERE) 160-9-4.8 MCG/ACT AERO Inhale 2 puffs into the lungs in the morning and at bedtime. Take 2 puffs first thing in am and then another 2 puffs about 12 hours later. Patient taking differently: Inhale 2 puffs into the lungs in the morning and at bedtime. 02/29/20   Tanda Rockers, MD  cholecalciferol (VITAMIN D3) 25 MCG (1000 UNIT) tablet Take 2,000 Units by mouth daily.    [provider]  clopidogrel (PLAVIX) 75 MG tablet Take 1 tablet (75 mg total) by mouth daily. PLEASE SCHEDULE APPOINTMENT FOR FURTHER REFILLS. 2ND ATTEMPT Patient taking differently: Take 75 mg by mouth daily. 06/20/19   Lorretta Harp, MD  cyclobenzaprine (FLEXERIL) 10 MG tablet Take 1 tablet (10 mg total) by mouth 3 (three) times daily as needed for muscle spasms. 08/07/20   Derek Jack, MD   dexlansoprazole (DEXILANT) 60 MG capsule Take 1 capsule (60 mg total) by mouth daily. 11/07/20   Derek Jack, MD  escitalopram (LEXAPRO) 10 MG tablet Take 10 mg by mouth daily.  02/21/12   [provider]  fenofibrate 160 MG tablet Take 160 mg by mouth daily.  05/25/18   [provider]  folic acid (FOLVITE) 1 MG tablet Take 1 mg by mouth daily.    [provider]  metoprolol tartrate (LOPRESSOR) 25 MG tablet Take 1 tablet (25 mg total) by mouth 2 (two) times daily as needed (afib). 04/28/20   [name], MD  prochlorperazine (COMPAZINE) 10 MG tablet Take 1 tablet (10 mg total) by mouth every 6 (six) hours as needed for nausea or vomiting. 08/26/20   Derek Jack, MD  Red Yeast Rice 600 MG CAPS Take 600 mg by mouth daily.     [provider]  rosuvastatin (CRESTOR) 20 MG tablet Take 20 mg by mouth daily.  06/19/18   [provider]  vitamin B-12 (CYANOCOBALAMIN) 1000 MCG tablet Take 1 tablet (1,000 mcg total) by mouth daily. 04/22/17   Twana First, MD    Allergies    Latex and Codeine  Review of Systems   Review of Systems  Constitutional:  Negative for chills and fever.  Gastrointestinal:  Positive for nausea. Negative for constipation, diarrhea and vomiting.  Genitourinary:  Positive for flank pain and frequency. Negative for dysuria and hematuria.  Musculoskeletal:  Positive for back pain.  All other systems reviewed and are negative.  Physical Exam Updated Vital Signs BP (!) 164/92   Pulse 73   Temp 97.7 F (36.5 C) (Oral)   Resp 18   Ht 5\' 4"  (1.626 m)   Wt 59 kg   SpO2 93%   BMI 22.31 kg/m   Physical Exam Vitals and nursing note reviewed.  Constitutional:      Appearance: She is not ill-appearing or diaphoretic.     Comments: Uncomfortable appearing female; writhing around in bed  HENT:     Head: Normocephalic and atraumatic.  Eyes:     Conjunctiva/sclera: Conjunctivae normal.  Cardiovascular:      Rate and Rhythm: Normal rate and regular rhythm.     Pulses: Normal pulses.  Pulmonary:  Effort: Pulmonary effort is normal.     Breath sounds: Wheezing present. No rhonchi or rales.     Comments: Mild end expiratory wheezing Abdominal:     Palpations: Abdomen is soft.     Tenderness: There is no abdominal tenderness. There is left CVA tenderness. There is no right CVA tenderness, guarding or rebound.  Musculoskeletal:     Cervical back: Neck supple.     Comments: No C, T, or L midline spinal TTP. + left CVA as well as left paralumbar musculature TTP. ROM intact to neck and back. Strength 5/5 to BLEs. Sensation intact throughout. Negative SLR bilaterally. 2+ DP pulses bilaterally.   Skin:    General: Skin is warm and dry.  Neurological:     Mental Status: She is alert.    ED Results / Procedures / Treatments   Labs (all labs ordered are listed, but only abnormal results are displayed) Labs Reviewed  BASIC METABOLIC PANEL - Abnormal; Notable for the following components:      Result Value   Sodium 129 (*)    Chloride 97 (*)    Glucose, Bld 113 (*)    All other components within normal limits  CBC WITH DIFFERENTIAL/PLATELET - Abnormal; Notable for the following components:   RBC 3.82 (*)    MCV 103.1 (*)    All other components within normal limits  URINALYSIS, ROUTINE W REFLEX MICROSCOPIC - Abnormal; Notable for the following components:   APPearance HAZY (*)    Hgb urine dipstick SMALL (*)    Bacteria, UA RARE (*)    All other components within normal limits    EKG None  Radiology CT Lumbar Spine Wo Contrast  Result Date: 12/28/2020 CLINICAL DATA:  Low back pain, cancer suspected EXAM: CT LUMBAR SPINE WITHOUT CONTRAST TECHNIQUE: Multidetector CT imaging of the lumbar spine was performed without intravenous contrast administration. Multiplanar CT image reconstructions were also generated. COMPARISON:  PET-CT 04/14/2020, CT abdomen pelvis 02/04/2014 FINDINGS:  Segmentation: 5 lumbar type vertebrae. Alignment: Normal. Vertebrae: There is no acute fracture or evidence of focal bone lesion. Severe degenerative endplate changes at U9-N2 and L5-S1. Paraspinal and other soft tissues: There is a 1.5 cm right adrenal nodule, previously measuring 1.3 cm on prior PET-CT and without abnormal metabolic activity, compatible with adenoma. Aorta bi-iliac atherosclerotic calcifications. Left common iliac stent, unchanged. Disc levels: Broad-based disc bulging and facet degenerative change results in mild spinal canal and bilateral neural foraminal narrowing at L3-L4. There is a posterior disc osteophyte complex at L4-L5 which results in mild-to-moderate canal narrowing and, moderate right and mild left neural foraminal narrowing. Posterior disc osteophyte complex and facet arthropathy at L5-S1 results in minimal canal narrowing, moderate right and left neural foraminal narrowing. IMPRESSION: No acute lumbar fracture.  No suspicious lytic or blastic lesion. Multilevel degenerative changes, worst at L4-L5 and L5-S1, with up moderate canal narrowing at L4-L5, moderate right neural foraminal narrowing at L4-L5, and moderate bilateral neural foraminal narrowing at L5-S1. Aortic Atherosclerosis (ICD10-I70.0). Electronically Signed   By: Maurine Simmering   On: 12/28/2020 12:20   CT PELVIS WO CONTRAST  Result Date: 12/28/2020 CLINICAL DATA:  left lower back pain/left posterior hip pain. hx of lung cancer. rule out mets EXAM: CT PELVIS WITHOUT CONTRAST TECHNIQUE: Multidetector CT imaging of the pelvis was performed following the standard protocol without intravenous contrast. COMPARISON:  None. FINDINGS: Urinary Tract:  No abnormality visualized. Bowel:  Unremarkable visualized pelvic bowel loops. Vascular/Lymphatic: No pathologically enlarged lymph nodes. Aorta bi-iliac  atherosclerotic calcifications. Reproductive:  No mass or other significant abnormality Other:  Fat containing left inguinal  hernia. Musculoskeletal: No suspicious bone lesions identified. No acute osseous abnormality. IMPRESSION: No acute osseous abnormality. No suspicious lytic or blastic lesion. Aortic Atherosclerosis (ICD10-I70.0). Electronically Signed   By: Maurine Simmering   On: 12/28/2020 12:27    Procedures Procedures   Medications Ordered in ED Medications  lidocaine (LIDODERM) 5 % 1 patch (1 patch Transdermal Patch Applied 12/28/20 1328)  oxyCODONE-acetaminophen (PERCOCET/ROXICET) 5-325 MG per tablet 1 tablet (1 tablet Oral Given 12/28/20 1001)  sodium chloride 0.9 % bolus 1,000 mL (0 mLs Intravenous Stopped 12/28/20 1152)  ketorolac (TORADOL) 30 MG/ML injection 30 mg (30 mg Intravenous Given 12/28/20 1321)    ED Course  I have reviewed the triage vital signs and the nursing notes.  Pertinent labs & imaging results that were available during my care of the patient were reviewed by me and considered in my medical decision making (see chart for details).  Clinical Course as of 12/28/20 1412  Sun Dec 28, 2020  1358 CT Lumbar Spine Wo Contrast [MV]    Clinical Course User Index [MV] Eustaquio Maize, PA-C   MDM Rules/Calculators/A&P                          60 year old female presents to the ED today complaining of left flank/left lower back pain for the past 2 weeks.  Patient also complaining of urinary frequency.  At times she states the pain radiates down her leg and other times she states the pain will radiate into her abdomen.  Patient is somewhat poor historian.  States that she thinks it is her kidneys as she was recently told she has stage II kidney disease.  Also with known stage III lung cancer, not currently on chemo or radiation as she declined therapy.  On arrival to the ED today patient is afebrile, nontachycardic and nontachypneic.  She is satting 93% on room air which appears to be her baseline, history of COPD.  On my exam patient appears uncomfortable and is writhing around in the bed.  She is  noted to have left CVA tenderness palpation as well as some left paralumbar musculature tenderness palpation.  She is equal strength and sensation in her bilateral lower extremities with negative straight leg raise.  She has no abdominal tenderness palpation.  Question UTI versus pyelonephritis versus kidney stone versus musculoskeletal type pain versus metastatic cancer to lumbar spine.  We will plan for labs including CBC, BMP, urinalysis at this time.  Will provide pain medication and reevaluate.  Will likely consider imaging to assess for either kidney etiology versus metastatic disease.  CBC without leukocytosis. Hgb stable at 13.0 BMP with creatinine 0.97, BUN 7. Sodium slightly decreased 129; will provide fluids at this time. No other electrolyte abnormalities U/A with small amount of hgb on dipstick however 0-5 RBC per HPF. No WBC and negative nitrites and leuks. It does appear pt has had hgb on dipstick in the past. Not consistent with hgb from kidney stone at this time.   Proceeded with CT L spine and CT pelvis without contrast to assess for bony abnormalities; negative for bony lesions however pt does have spinal stenosis which could be contributing to pain?   On further evaluation pt still uncomfortable and in pain. Will provide toradol given normal kidney function and reassess. I suspect likely musculoskeletal pain at this time vs stenosis type pain.  Pt reports she wants to go home at this time and rest although she continues to seem in pain. Have offered additional pain meds here however she continues to state she wants to go home. Will provide prednisone at this time to see if it helps her pain. Family member/husband at bedside reports that she was prescribed a new medication yesterday that they believe was toradol - unable to see PCP notes in the system. She was on tramadol in beginning of May however she denies taking tramadol recently. Have advised discontinuing the toradol with  prednisone and will prescribe short course of percocet instead. Pt also advised on salon pas patches. Pt in agreement with plan and stable for discharge.   This note was prepared using Dragon voice recognition software and may include unintentional dictation errors due to the inherent limitations of voice recognition software.  Final Clinical Impression(s) / ED Diagnoses Final diagnoses:  Acute left-sided low back pain without sciatica  Spinal stenosis of lumbar region, unspecified whether neurogenic claudication present    Rx / DC Orders ED Discharge Orders          Ordered    predniSONE (STERAPRED UNI-PAK 21 TAB) 10 MG (21) TBPK tablet  Daily        12/28/20 1411    oxyCODONE-acetaminophen (PERCOCET/ROXICET) 5-325 MG tablet  Every 6 hours PRN        12/28/20 1411             Discharge Instructions      Please pick up medication and take as prescribed. Discontinue the Toradol you have been prescribed yesterday.  Do not take Ibuprofen with the prednisone as it can cause GI upset.   You can also buy SalonPas patches OTC to help with pain  Follow up with Dr. Nevada Crane for further evaluation  Return to the ED for any new/worsening symptoms       Eustaquio Maize, PA-C 12/28/20 1413    Daleen Bo, MD 12/28/20 1820

## 2021-01-02 ENCOUNTER — Inpatient Hospital Stay: Payer: Commercial Managed Care - PPO | Attending: Internal Medicine

## 2021-01-02 ENCOUNTER — Encounter (HOSPITAL_COMMUNITY): Payer: Self-pay

## 2021-01-02 ENCOUNTER — Other Ambulatory Visit: Payer: Self-pay

## 2021-01-02 ENCOUNTER — Ambulatory Visit (HOSPITAL_COMMUNITY)
Admission: RE | Admit: 2021-01-02 | Discharge: 2021-01-02 | Disposition: A | Discharge status: Home / Self Care | Payer: Commercial Managed Care - PPO | Source: Ambulatory Visit | Attending: Internal Medicine | Admitting: Internal Medicine

## 2021-01-02 DIAGNOSIS — C787 Secondary malignant neoplasm of liver and intrahepatic bile duct: Secondary | ICD-10-CM | POA: Insufficient documentation

## 2021-01-02 DIAGNOSIS — C3490 Malignant neoplasm of unspecified part of unspecified bronchus or lung: Secondary | ICD-10-CM | POA: Diagnosis not present

## 2021-01-02 DIAGNOSIS — Z5112 Encounter for antineoplastic immunotherapy: Secondary | ICD-10-CM | POA: Diagnosis not present

## 2021-01-02 DIAGNOSIS — Z79899 Other long term (current) drug therapy: Secondary | ICD-10-CM | POA: Diagnosis not present

## 2021-01-02 DIAGNOSIS — C3411 Malignant neoplasm of upper lobe, right bronchus or lung: Secondary | ICD-10-CM | POA: Insufficient documentation

## 2021-01-02 DIAGNOSIS — I10 Essential (primary) hypertension: Secondary | ICD-10-CM | POA: Diagnosis not present

## 2021-01-02 DIAGNOSIS — Z5111 Encounter for antineoplastic chemotherapy: Secondary | ICD-10-CM | POA: Insufficient documentation

## 2021-01-02 DIAGNOSIS — Z7982 Long term (current) use of aspirin: Secondary | ICD-10-CM | POA: Insufficient documentation

## 2021-01-02 HISTORY — DX: Malignant (primary) neoplasm, unspecified: C80.1

## 2021-01-02 LAB — CMP (CANCER CENTER ONLY)
ALT: 21 U/L (ref 0–44)
AST: 17 U/L (ref 15–41)
Albumin: 4.6 g/dL (ref 3.5–5.0)
Alkaline Phosphatase: 138 U/L — ABNORMAL HIGH (ref 38–126)
Anion gap: 11 (ref 5–15)
BUN: 23 mg/dL — ABNORMAL HIGH (ref 6–20)
CO2: 27 mmol/L (ref 22–32)
Calcium: 10.2 mg/dL (ref 8.9–10.3)
Chloride: 97 mmol/L — ABNORMAL LOW (ref 98–111)
Creatinine: 1.24 mg/dL — ABNORMAL HIGH (ref 0.44–1.00)
GFR, Estimated: 50 mL/min — ABNORMAL LOW (ref >60)
Glucose, Bld: 100 mg/dL — ABNORMAL HIGH (ref 70–99)
Potassium: 4.8 mmol/L (ref 3.5–5.1)
Sodium: 135 mmol/L (ref 135–145)
Total Bilirubin: 0.7 mg/dL (ref 0.3–1.2)
Total Protein: 7.7 g/dL (ref 6.5–8.1)

## 2021-01-02 LAB — CBC WITH DIFFERENTIAL (CANCER CENTER ONLY)
Abs Immature Granulocytes: 0.04 10*3/uL (ref 0.00–0.07)
Basophils Absolute: 0 10*3/uL (ref 0.0–0.1)
Basophils Relative: 0 %
Eosinophils Absolute: 0 10*3/uL (ref 0.0–0.5)
Eosinophils Relative: 0 %
HCT: 46.8 % — ABNORMAL HIGH (ref 36.0–46.0)
Hemoglobin: 15.9 g/dL — ABNORMAL HIGH (ref 12.0–15.0)
Immature Granulocytes: 0 %
Lymphocytes Relative: 10 %
Lymphs Abs: 1.1 10*3/uL (ref 0.7–4.0)
MCH: 33.8 pg (ref 26.0–34.0)
MCHC: 34 g/dL (ref 30.0–36.0)
MCV: 99.6 fL (ref 80.0–100.0)
Monocytes Absolute: 0.4 10*3/uL (ref 0.1–1.0)
Monocytes Relative: 4 %
Neutro Abs: 9.1 10*3/uL — ABNORMAL HIGH (ref 1.7–7.7)
Neutrophils Relative %: 86 %
Platelet Count: 271 10*3/uL (ref 150–400)
RBC: 4.7 MIL/uL (ref 3.87–5.11)
RDW: 14.2 % (ref 11.5–15.5)
WBC Count: 10.7 10*3/uL — ABNORMAL HIGH (ref 4.0–10.5)
nRBC: 0 % (ref 0.0–0.2)

## 2021-01-02 MED ORDER — SODIUM CHLORIDE (PF) 0.9 % IJ SOLN
INTRAMUSCULAR | Status: AC
  Filled 2021-01-02: qty 50

## 2021-01-02 MED ORDER — GADOBUTROL 1 MMOL/ML IV SOLN
5.0000 mL | Freq: Once | INTRAVENOUS | Status: AC | PRN
  Administered 2021-01-02: 5 mL via INTRAVENOUS

## 2021-01-02 MED ORDER — IOHEXOL 300 MG/ML  SOLN
75.0000 mL | Freq: Once | INTRAMUSCULAR | Status: AC | PRN
  Administered 2021-01-02: 75 mL via INTRAVENOUS

## 2021-01-05 ENCOUNTER — Inpatient Hospital Stay: Payer: Commercial Managed Care - PPO | Admitting: Internal Medicine

## 2021-01-05 ENCOUNTER — Other Ambulatory Visit: Payer: Self-pay

## 2021-01-05 VITALS — BP 121/79 | HR 81 | Temp 96.4°F | Resp 19 | Ht 64.0 in | Wt 130.7 lb

## 2021-01-05 DIAGNOSIS — C3490 Malignant neoplasm of unspecified part of unspecified bronchus or lung: Secondary | ICD-10-CM

## 2021-01-05 DIAGNOSIS — C3491 Malignant neoplasm of unspecified part of right bronchus or lung: Secondary | ICD-10-CM | POA: Insufficient documentation

## 2021-01-05 DIAGNOSIS — Z5112 Encounter for antineoplastic immunotherapy: Secondary | ICD-10-CM | POA: Insufficient documentation

## 2021-01-05 DIAGNOSIS — Z5111 Encounter for antineoplastic chemotherapy: Secondary | ICD-10-CM | POA: Insufficient documentation

## 2021-01-05 MED ORDER — PROCHLORPERAZINE MALEATE 10 MG PO TABS: 10.0000 mg | ORAL_TABLET | Freq: Four times a day (QID) | ORAL | 0 refills | Status: DC | PRN

## 2021-01-05 MED ORDER — CYANOCOBALAMIN 1000 MCG/ML IJ SOLN
1000.0000 ug | Freq: Once | INTRAMUSCULAR | Status: AC
  Administered 2021-01-05: 1000 ug via INTRAMUSCULAR

## 2021-01-05 MED ORDER — FOLIC ACID 1 MG PO TABS: 1.0000 mg | ORAL_TABLET | Freq: Every day | ORAL | 4 refills | Status: DC

## 2021-01-05 MED ORDER — CYANOCOBALAMIN 1000 MCG/ML IJ SOLN
INTRAMUSCULAR | Status: AC
  Filled 2021-01-05: qty 1

## 2021-01-05 MED ORDER — OXYCODONE-ACETAMINOPHEN 5-325 MG PO TABS: 1.0000 | ORAL_TABLET | Freq: Four times a day (QID) | ORAL | 0 refills | Status: DC | PRN

## 2021-01-05 NOTE — Progress Notes (Signed)
DISCONTINUE ON PATHWAY REGIMEN - Non-Small Cell Lung     A cycle is every 21 days:     Pemetrexed      Carboplatin   **Always confirm dose/schedule in your pharmacy ordering system**  REASON: Disease Progression PRIOR TREATMENT: [id number]: Carboplatin AUC=5 + Pemetrexed 500 mg/m2 q21 Days x 4 Cycles TREATMENT RESPONSE: Unable to Evaluate  START ON PATHWAY REGIMEN - Non-Small Cell Lung     A cycle is every 21 days:     Pembrolizumab      Pemetrexed      Carboplatin   **Always confirm dose/schedule in your pharmacy ordering system**  Patient Characteristics: Stage IV Metastatic, Nonsquamous, Molecular Analysis Completed, Molecular Alteration Present and Targeted Therapy Exhausted OR EGFR Exon 20+ or KRAS G12C+ Present and No Prior Chemo/Immunotherapy OR No Alteration Present, Initial  Chemotherapy/Immunotherapy, PS = 0, 1, BRAF/MET/KRAS Mutation Positive, Candidate for Immunotherapy, PD-L1 Expression Positive 1-49% (TPS) / Negative / Not Tested / Awaiting Test Results and Immunotherapy Candidate Therapeutic Status: Stage IV Metastatic Histology: Nonsquamous Cell Broad Molecular Profiling Status: Molecular Analysis Completed Molecular Analysis Results: EGFR Exon 20 Insertion Present or KRAS G12C Present, and No Prior Chemo/Immunotherapy ECOG Performance Status: 1 Chemotherapy/Immunotherapy Line of Therapy: Initial Chemotherapy/Immunotherapy Immunotherapy Candidate Status: Candidate for Immunotherapy PD-L1 Expression Status: Quantity Not Sufficient Intent of Therapy: Non-Curative / Palliative Intent, Discussed with Patient

## 2021-01-05 NOTE — Progress Notes (Signed)
[facility] [phone]   [phone]  OFFICE PROGRESS NOTE  Celene Squibb, MD [address] [place] [address]  DIAGNOSIS: Metastatic non-small cell lung cancer, adenocarcinoma that was initially diagnosed as stage IIIA (T4, N1, M0) non-small cell lung cancer, adenocarcinoma in November 2021 status post right upper lobe segmentectomy in addition to wedge resection of the right middle lobe with lymph node dissection.  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 She declined adjuvant systemic chemotherapy.  CURRENT THERAPY: Systemic chemotherapy with carboplatin for AUC of 5, Alimta 500 Mg/M2 and Keytruda 200 Mg IV every 3 weeks.  First dose January 13, 2021.  INTERVAL HISTORY: Connie Farrell 60 y.o. female returns to the clinic today for follow-up visit accompanied by her daughter, Andee Poles.  The patient is complaining of pain on the left side of the chest that has been going on for more than a week.  She was seen at the emergency department and was given prescription for oxycodone with mild improvement.  She has been on treatment with tramadol for chronic back pain by her primary care physician Dr. Nevada Crane.  Denied having any shortness of breath except with exertion with mild cough and no hemoptysis.  She has no nausea, vomiting, diarrhea or constipation.  She denied having any headache or visual changes.  The patient had MRI of the brain as well as CT scan of the chest performed recently and she is here for evaluation and discussion of her scan results and treatment options.  MEDICAL HISTORY: Past Medical History:  Diagnosis Date   Anxiety    Arthritis    "hands; spine too" (04/04/2012)   Cancer (HCC)    COPD (chronic obstructive pulmonary disease) (HCC)    Exertional dyspnea     Folate deficiency 10/17/2015   GERD (gastroesophageal reflux disease)    Hyperlipemia    Hypertension    Macrocytosis 10/17/2015   Neck pain, chronic    PAD (peripheral artery disease) (HCC)    Renal artery stenosis (HCC)    Renal disorder    Seasonal allergies    Tobacco abuse     ALLERGIES:  is allergic to latex and codeine.  MEDICATIONS:  Current Outpatient Medications  Medication Sig Dispense Refill   albuterol (VENTOLIN HFA) 108 (90 Base) MCG/ACT inhaler Inhale 1-2 puffs into the lungs every 6 (six) hours as needed for wheezing or shortness of breath.     Budeson-Glycopyrrol-Formoterol (BREZTRI AEROSPHERE) 160-9-4.8 MCG/ACT AERO Inhale 2 puffs into the lungs in the morning and at bedtime. Take 2 puffs first thing in am and then another 2 puffs about 12 hours later. (Patient taking differently: Inhale 2 puffs into the lungs in the morning and at bedtime.) 10.7 g 11   cyclobenzaprine (FLEXERIL) 10 MG tablet Take 1 tablet (10 mg total) by mouth 3 (three) times daily as needed for muscle spasms. 30 tablet 1   dexlansoprazole (DEXILANT) 60 MG capsule Take 1 capsule (60 mg total) by mouth daily. 90 capsule 1   escitalopram (LEXAPRO) 10 MG tablet Take 10 mg by mouth daily.      metoprolol tartrate (LOPRESSOR) 25 MG tablet Take 1 tablet (25 mg total) by mouth 2 (two) times daily as needed (afib). 60 tablet 0   oxyCODONE-acetaminophen (PERCOCET/ROXICET) 5-325 MG tablet Take  1 tablet by mouth every 6 (six) hours as needed for severe pain. 15 tablet 0   predniSONE (STERAPRED UNI-PAK 21 TAB) 10 MG (21) TBPK tablet Take by mouth daily. Follow package insert 21 tablet 0   prochlorperazine (COMPAZINE) 10 MG tablet Take 1 tablet (10 mg total) by mouth every 6 (six) hours as needed for nausea or vomiting. 30 tablet 3   rosuvastatin (CRESTOR) 20 MG tablet Take 20 mg by mouth daily.      acetaminophen (TYLENOL) 500 MG tablet Take 500 mg by mouth every 6 (six) hours as needed for moderate pain. (Patient  not taking: Reported on 01/05/2021)     amLODipine (NORVASC) 5 MG tablet Take 1 tablet (5 mg total) by mouth daily. 180 tablet 3   aspirin EC 81 MG tablet Take 81 mg by mouth daily. (Patient not taking: Reported on 01/05/2021)     cholecalciferol (VITAMIN D3) 25 MCG (1000 UNIT) tablet Take 2,000 Units by mouth daily. (Patient not taking: Reported on 01/05/2021)     clopidogrel (PLAVIX) 75 MG tablet Take 1 tablet (75 mg total) by mouth daily. PLEASE SCHEDULE APPOINTMENT FOR FURTHER REFILLS. 2ND ATTEMPT (Patient not taking: Reported on 01/05/2021) 15 tablet 0   fenofibrate 160 MG tablet Take 160 mg by mouth daily.  (Patient not taking: Reported on 11/26/2583)     folic acid (FOLVITE) 1 MG tablet Take 1 mg by mouth daily. (Patient not taking: Reported on 01/05/2021)     Red Yeast Rice 600 MG CAPS Take 600 mg by mouth daily.  (Patient not taking: Reported on 01/05/2021)     vitamin B-12 (CYANOCOBALAMIN) 1000 MCG tablet Take 1 tablet (1,000 mcg total) by mouth daily. (Patient not taking: Reported on 01/05/2021) 60 tablet 2   No current facility-administered medications for this visit.    SURGICAL HISTORY:  Past Surgical History:  Procedure Laterality Date   ABDOMINAL ANGIOGRAM  04/04/2012   Procedure: ABDOMINAL ANGIOGRAM;  Surgeon: Lorretta Harp, MD;  Location[facility] CATH LAB;  Service: Cardiovascular;;   ANTERIOR CERVICAL DECOMP/DISCECTOMY FUSION  ~ 04/2006   C6, 7, T1   APPENDECTOMY     COLONOSCOPY N/A 02/26/2014   Adequate preparation. Minimal anal canal hemorrhoids;otherwise, normal rectumdiminutive polyps in the middescending segment; otherwise, the remainder of colonic mucosaappeared normal. Hyperplastic polyps.   ESOPHAGOGASTRODUODENOSCOPY N/A 02/26/2014   IDP:OEUMPNTI distal esophagus, query short segment Barrett's. Status post Venia Minks dilation with esophageal biopsy. Abnormal gastric mucosa. Esophageal biopsy negative for Barrett's. Gastric biopsy showed mild chronic gastritis, no H pylori.    INTERCOSTAL NERVE BLOCK Right 06/16/2020   Procedure: INTERCOSTAL NERVE BLOCK;  Surgeon: [name], MD;  Location: [place];  Service: Thoracic;  Laterality: Right;   LAPAROSCOPIC APPENDECTOMY N/A 09/10/2013   Procedure: APPENDECTOMY LAPAROSCOPIC;  Surgeon: Jamesetta So, MD;  Location: [facility] ORS;  Service: General;  Laterality: N/A;   LOWER EXTREMITY ANGIOGRAM N/A 04/04/2012   Procedure: LOWER EXTREMITY ANGIOGRAM;  Surgeon: Lorretta Harp, MD;  Location: [facility] CATH LAB;  Service: Cardiovascular;  Laterality: N/A;   MALONEY DILATION N/A 02/26/2014   Procedure: Venia Minks DILATION;  Surgeon: Daneil Dolin, MD;  Location: [facility] ENDO SU[facility];  Service: Endoscopy;  Laterality: N/A;   NODE DISSECTION Right 06/16/2020   Procedure: NODE DISSECTION;  Surgeon: [name], MD;  Location: [place];  Service: Thoracic;  Laterality: Right;   PERIPHERAL ARTERIAL STENT GRAFT  04/04/2012   RENAL ANGIOGRAM Left 04/22/2014   Procedure: RENAL ANGIOGRAM;  Surgeon: Lorretta Harp,  MD;  [facility];  Service: Cardiovascular;  Laterality: Left;   [place]   VASCULAR SURGERY  03/31/2016   Peripheral arterial stent   VIDEO BRONCHOSCOPY WITH ENDOBRONCHIAL NAVIGATION N/A 04/28/2020   Procedure: VIDEO BRONCHOSCOPY WITH ENDOBRONCHIAL NAVIGATION;  Surgeon: [name], MD;  Location: [place];  Service: Thoracic;  Laterality: N/A;   VIDEO BRONCHOSCOPY WITH ENDOBRONCHIAL NAVIGATION N/A 06/16/2020   Procedure: VIDEO BRONCHOSCOPY WITH ENDOBRONCHIAL NAVIGATION;  Surgeon: [name], MD;  Location: [place];  Service: Thoracic;  Laterality: N/A;    REVIEW OF SYSTEMS:  Constitutional: positive for anorexia and fatigue Eyes: negative Ears, nose, mouth, throat, and face: negative Respiratory: positive for cough and pleurisy/chest pain Cardiovascular: negative Gastrointestinal: negative Genitourinary:negative Integument/breast: negative Hematologic/lymphatic:  negative Musculoskeletal:positive for back pain Neurological: negative Behavioral/Psych: negative Endocrine: negative Allergic/Immunologic: negative   PHYSICAL EXAMINATION: General appearance: alert, cooperative, and appears stated age Head: Normocephalic, without obvious abnormality, atraumatic Neck: no adenopathy, no JVD, supple, symmetrical, trachea midline, and thyroid not enlarged, symmetric, no tenderness/mass/nodules Lymph nodes: Cervical, supraclavicular, and axillary nodes normal. Resp: clear to auscultation bilaterally Back: symmetric, no curvature. ROM normal. No CVA tenderness. Cardio: regular rate and rhythm, S1, S2 normal, no murmur, click, rub or gallop GI: soft, non-tender; bowel sounds normal; no masses,  no organomegaly Extremities: extremities normal, atraumatic, no cyanosis or edema Neurologic: Alert and oriented X 3, normal strength and tone. Normal symmetric reflexes. Normal coordination and gait  ECOG PERFORMANCE STATUS: 1 - Symptomatic but completely ambulatory  Blood pressure 121/79, pulse 81, temperature (!) 96.4 F (35.8 C), temperature source Tympanic, resp. rate 19, height _0  (1.626 m), weight 130 lb 11.2 oz (59.3 kg), SpO2 98 %.  LABORATORY DATA: Lab Results  Component Value Date   WBC 10.7 (H) 01/02/2021   HGB 15.9 (H) 01/02/2021   HCT 46.8 (H) 01/02/2021   MCV 99.6 01/02/2021   PLT 271 01/02/2021      Chemistry      Component Value Date/Time   NA 135 01/02/2021 1315   K 4.8 01/02/2021 1315   CL 97 (L) 01/02/2021 1315   CO2 27 01/02/2021 1315   BUN 23 (H) 01/02/2021 1315   CREATININE 1.24 (H) 01/02/2021 1315   CREATININE 1.36 (H) 04/16/2014 1034      Component Value Date/Time   CALCIUM 10.2 01/02/2021 1315   ALKPHOS 138 (H) 01/02/2021 1315   AST 17 01/02/2021 1315   ALT 21 01/02/2021 1315   BILITOT 0.7 01/02/2021 1315       RADIOGRAPHIC STUDIES: CT Chest W Contrast  Result Date: 01/05/2021 CLINICAL DATA:  Non-small cell lung  cancer. History of right middle lobe wedge resection and right upper lobe anterior segmentectomy. EXAM: CT CHEST WITH CONTRAST TECHNIQUE: Multidetector CT imaging of the chest was performed during intravenous contrast administration. CONTRAST:  58m OMNIPAQUE IOHEXOL 300 MG/ML  SOLN COMPARISON:  Chest CT 08/01/2020 FINDINGS: Cardiovascular: The heart is normal in size. No pericardial effusion. The aorta is normal in caliber. No dissection. Vessel the branch vessels are patent. Stable scattered coronary artery calcifications. Mediastinum/Nodes: Progressive mediastinal lymphadenopathy. Right paratracheal lymph node on image 27/2 measures 10 mm and previously measured 4.5 mm. Stable right paratracheal node on image 50/2 measuring 8.5 mm. Subcarinal node on image 66/2 measures 12 mm and previously measured 8 mm. Significant progression of left hilar adenopathy. Nodal mass on image number 70/2 measures 2.3 cm and previously measured 10 mm. This is associated with an enlarging left lower  lobe mass. Prevascular node on image 60/2 measures 8 mm and previously measured 5 mm. Lungs/Pleura: Stable surgical changes involving the right upper lobe and right middle lobe. No recurrent tumor in these areas. Enlarging left lower lobe lung mass and adjacent left hilar adenopathy. Findings consistent with progressive neoplasm. Maximum short axis diameter measures 2.1 cm on image 80/2. This was previously 1.5 cm. Stable underlying emphysematous changes and pulmonary scarring. There are few scattered tiny peripheral pulmonary nodules which are stable. New 4.5 mm left upper nodule on image 35/7. No acute overlying pulmonary process. Upper Abdomen: There is a 15 mm lesion in segment 7 on image 124/2. Suspect a 10 mm lesion in the right hepatic lobe on image 132/2. Stable bilateral adrenal gland lesions. No upper abdominal lymphadenopathy. Musculoskeletal: No lytic or sclerotic bone lesions are identified. IMPRESSION: 1. Progressive  mediastinal and left hilar lymphadenopathy. 2. Enlarging left lower lobe lung mass and adjacent left hilar adenopathy. 3. New 4.5 mm left upper lobe pulmonary nodule. 4. Suspect new hepatic metastatic disease. 5. Recommend PET-CT for further evaluation and restaging. 6. Stable surgical changes involving the right upper lobe and right middle lobe. No recurrent tumor in these areas. 7. Stable emphysematous changes and pulmonary scarring. 8. Stable bilateral adrenal gland lesions. Aortic Atherosclerosis (ICD10-I70.0) and Emphysema (ICD10-J43.9). Electronically Signed   By: Marijo Sanes M.D.   On: 01/05/2021 09:33   CT Lumbar Spine Wo Contrast  Result Date: 12/28/2020 CLINICAL DATA:  Low back pain, cancer suspected EXAM: CT LUMBAR SPINE WITHOUT CONTRAST TECHNIQUE: Multidetector CT imaging of the lumbar spine was performed without intravenous contrast administration. Multiplanar CT image reconstructions were also generated. COMPARISON:  PET-CT 04/14/2020, CT abdomen pelvis 02/04/2014 FINDINGS: Segmentation: 5 lumbar type vertebrae. Alignment: Normal. Vertebrae: There is no acute fracture or evidence of focal bone lesion. Severe degenerative endplate changes at Z2-Y4 and L5-S1. Paraspinal and other soft tissues: There is a 1.5 cm right adrenal nodule, previously measuring 1.3 cm on prior PET-CT and without abnormal metabolic activity, compatible with adenoma. Aorta bi-iliac atherosclerotic calcifications. Left common iliac stent, unchanged. Disc levels: Broad-based disc bulging and facet degenerative change results in mild spinal canal and bilateral neural foraminal narrowing at L3-L4. There is a posterior disc osteophyte complex at L4-L5 which results in mild-to-moderate canal narrowing and, moderate right and mild left neural foraminal narrowing. Posterior disc osteophyte complex and facet arthropathy at L5-S1 results in minimal canal narrowing, moderate right and left neural foraminal narrowing. IMPRESSION: No  acute lumbar fracture.  No suspicious lytic or blastic lesion. Multilevel degenerative changes, worst at L4-L5 and L5-S1, with up moderate canal narrowing at L4-L5, moderate right neural foraminal narrowing at L4-L5, and moderate bilateral neural foraminal narrowing at L5-S1. Aortic Atherosclerosis (ICD10-I70.0). Electronically Signed   By: Maurine Simmering   On: 12/28/2020 12:20   CT PELVIS WO CONTRAST  Result Date: 12/28/2020 CLINICAL DATA:  left lower back pain/left posterior hip pain. hx of lung cancer. rule out mets EXAM: CT PELVIS WITHOUT CONTRAST TECHNIQUE: Multidetector CT imaging of the pelvis was performed following the standard protocol without intravenous contrast. COMPARISON:  None. FINDINGS: Urinary Tract:  No abnormality visualized. Bowel:  Unremarkable visualized pelvic bowel loops. Vascular/Lymphatic: No pathologically enlarged lymph nodes. Aorta bi-iliac atherosclerotic calcifications. Reproductive:  No mass or other significant abnormality Other:  Fat containing left inguinal hernia. Musculoskeletal: No suspicious bone lesions identified. No acute osseous abnormality. IMPRESSION: No acute osseous abnormality. No suspicious lytic or blastic lesion. Aortic Atherosclerosis (ICD10-I70.0). Electronically Signed  By: Maurine Simmering   On: 12/28/2020 12:27   MR Brain W Wo Contrast  Result Date: 01/02/2021 CLINICAL DATA:  Non-small cell lung cancer staging. EXAM: MRI HEAD WITHOUT AND WITH CONTRAST TECHNIQUE: Multiplanar, multiecho pulse sequences of the brain and surrounding structures were obtained without and with intravenous contrast. CONTRAST:  43m GADAVIST GADOBUTROL 1 MMOL/ML IV SOLN COMPARISON:  CT head May 28, 2011. FINDINGS: Brain: Nonenhancing area of encephalomalacia in the anterior right basal ganglia with associated susceptibility artifact and surrounding gliosis, possibly the sequela of remote hemorrhagic infarct. No evidence of acute infarct or acute hemorrhage. No hydrocephalus. Mild  to moderate scattered T2/FLAIR hyperintensities within the white matter, likely the sequela of chronic microvascular ischemic disease. Punctate focus of enhancement in the inferolateral left parietal lobe (series 20, image 91; series 21, image 9). No mass lesion or abnormal mass effect. No extra-axial fluid collection. Vascular: Major arterial flow voids are maintained at the skull base. Skull and upper cervical spine: Normal marrow signal. Sinuses/Orbits: Clear sinuses.  Unremarkable orbits. Other: No mastoid effusions. IMPRESSION: 1. Punctate focus of enhancement in the in the inferolateral left parietal lobe (series 20, image 91; series 21, image 9) may represent a small cortical vessel, but warrants short interval follow-up MRI with contrast in approximately 4 weeks to ensure stability and exclude metastatic disease. 2. Remote insult in the right basal ganglia, potentially a prior hemorrhagic infarct. 3. Mild to moderate chronic microvascular ischemic disease. Electronically Signed   By: FMargaretha SheffieldMD   On: 01/02/2021 18:45    ASSESSMENT AND PLAN: This is a very pleasant 60years old white female presented with metastatic non-small cell lung cancer, adenocarcinoma with positive K-ras G 12 C mutation that was initially diagnosed as stage IIIa (T4, N1, M0) adenocarcinoma in November 2021 status post right upper lobe segmentectomy in addition to mediastinal lymph node dissection under the care of Dr. HRoxan Hockey  The patient was seen by Dr. KDelton Coombesand she declined adjuvant systemic chemotherapy at that time. She was followed by observation and she came a month ago to establish care with me. I ordered CT scan of the chest as well as MRI of the brain which were performed recently.  I personally and independently reviewed the scan images and discussed the results with the patient and her daughter. Unfortunately her scan showed evidence for disease recurrence presented with progressive mediastinal and  left hilar adenopathy in addition to enlarging left lower lobe lung mass and adjacent left hilar adenopathy and new 4.5 mm left upper lobe pulmonary nodule and suspicious new hepatic metastasis. This finding are consistent with disease metastasis. I recommended for the patient to have a PET scan for further evaluation of her disease. She also had MRI of the brain that showed punctate focus of enhancement in the left inferior lateral left parietal lobe suspicious for small cortical vessel but metastatic disease could not be excluded. I explained to the patient that she has incurable condition and all the treatment will be of palliative nature. She was giving the option of palliative care and hospice referral versus consideration of palliative systemic chemotherapy with carboplatin for AUC of 5, Alimta 500 Mg/M2 and Keytruda 200 Mg IV every 3 weeks.  The patient and her daughter are interested in treatment but she would like to discuss it in more details with her husband. I discussed with the patient the adverse effect of this treatment including but not limited to alopecia, myelosuppression, nausea and vomiting, peripheral neuropathy, liver or  renal dysfunction as well as the adverse effect of the immunotherapy. She is expected to start the first cycle of this treatment next week unless she changes her mind. She will receive vitamin B12 injection today.  I will also call her pharmacy with prescription for Compazine 10 mg p.o. every 6 hours as needed for nausea as well as folic acid 1 mg p.o. daily. I will arrange for the patient to have a chemotherapy education class before the first dose of her treatment. She will come back for follow-up visit in 2 weeks for evaluation and management of any adverse effect of her treatment. For pain management we will evaluate the PET scan for any suspicious metastatic lesion responsible for her pain but this is most likely musculoskeletal and degenerative disease.  She  will continue her current treatment with tramadol under the care of Dr. Nevada Crane.  I gave her prescription of oxycodone to be used on as-needed basis but she understands if there is no oncologic etiology for her pain that I will discontinue her current pain medication with oxycodone. The patient was advised to call immediately if she has any other concerning symptoms in the interval. The patient voices understanding of current disease status and treatment options and is in agreement with the current care plan.  All questions were answered. The patient knows to call the clinic with any problems, questions or concerns. We can certainly see the patient much sooner if necessary. The total time spent in the appointment was 55 minutes.  Disclaimer: This note was dictated with voice recognition software. Similar sounding words can inadvertently be transcribed and may not be corrected upon review.

## 2021-01-06 ENCOUNTER — Telehealth: Payer: Self-pay

## 2021-01-06 ENCOUNTER — Ambulatory Visit: Payer: Commercial Managed Care - PPO | Admitting: Thoracic Surgery (Cardiothoracic Vascular Surgery)

## 2021-01-06 VITALS — BP 126/79 | HR 84 | Resp 20 | Ht 64.0 in | Wt 130.0 lb

## 2021-01-06 DIAGNOSIS — Z9889 Other specified postprocedural states: Secondary | ICD-10-CM | POA: Diagnosis not present

## 2021-01-06 DIAGNOSIS — C3491 Malignant neoplasm of unspecified part of right bronchus or lung: Secondary | ICD-10-CM | POA: Diagnosis not present

## 2021-01-06 NOTE — Telephone Encounter (Signed)
Notified Brissa Asante that Prior Authorization had been approved for Oxycodone with Acetaminophen 5/325 mg from 01/06/2021 through 01/06/2022

## 2021-01-06 NOTE — Progress Notes (Signed)
[address]       [place] [address]             [phone]     HPI: [name] returns for a scheduled follow-up visit regarding her lung cancer.  Connie Farrell is a 60 year old woman with a history of tobacco abuse, lung cancer, hypertension, hyperlipidemia, ASCVD, PAD, renal artery stenosis, reflux, arthritis, macrocytic anemia, and anxiety.  She was found to have lung nodules on low-dose screening CT in September 2019.  She was followed with serial CTs.  There was progressive enlargement of the nodules and then she developed new consolidation in the anterior left lower lobe.  Bronchoscopy showed cancer on the right but only inflammatory cells on the left side.  She was given the benefit of the doubt and we proceeded with a robotic right upper lobe posterior segmentectomy, wedge resection of the left middle lobe nodule and blunt dissection.  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  She refused adjuvant chemotherapy at that time.  She has not had any issues related to her surgery.  Recently she is coming complaining of left-sided flank pain and swelling.  She was seen in the emergency room for that.  She had a CT of the chest which showed some progression of adenopathy on the left side.  She saw Dr. Julien Nordmann and is scheduled to start on chemotherapy with carboplatin and pemetrexed and Keytruda next week.  Past Medical History:  Diagnosis Date   Anxiety    Arthritis    "hands; spine too" (04/04/2012)   Cancer (HCC)    COPD (chronic obstructive pulmonary disease) (HCC)    Exertional dyspnea    Folate deficiency 10/17/2015   GERD (gastroesophageal reflux disease)    Hyperlipemia    Hypertension    Macrocytosis 10/17/2015   Neck pain, chronic    PAD (peripheral artery disease) (HCC)    Renal artery stenosis (HCC)    Renal disorder    Seasonal allergies    Tobacco abuse     Current Outpatient Medications  Medication Sig Dispense Refill    albuterol (VENTOLIN HFA) 108 (90 Base) MCG/ACT inhaler Inhale 1-2 puffs into the lungs every 6 (six) hours as needed for wheezing or shortness of breath.     Budeson-Glycopyrrol-Formoterol (BREZTRI AEROSPHERE) 160-9-4.8 MCG/ACT AERO Inhale 2 puffs into the lungs in the morning and at bedtime. Take 2 puffs first thing in am and then another 2 puffs about 12 hours later. (Patient taking differently: Inhale 2 puffs into the lungs in the morning and at bedtime.) 10.7 g 11   dexlansoprazole (DEXILANT) 60 MG capsule Take 1 capsule (60 mg total) by mouth daily. 90 capsule 1   escitalopram (LEXAPRO) 10 MG tablet Take 10 mg by mouth daily.      folic acid (FOLVITE) 1 MG tablet Take 1 mg by mouth daily.     folic acid (FOLVITE) 1 MG tablet Take 1 tablet (1 mg total) by mouth daily. 30 tablet 4   metoprolol tartrate (LOPRESSOR) 25 MG tablet Take 1 tablet (25 mg total) by mouth 2 (two) times daily as needed (afib). 60 tablet 0   oxyCODONE-acetaminophen (PERCOCET/ROXICET) 5-325 MG tablet Take 1 tablet by mouth every 6 (six) hours as needed for severe pain. 30 tablet 0   rosuvastatin (CRESTOR) 20 MG tablet Take 20 mg by mouth daily.      vitamin B-12 (CYANOCOBALAMIN) 1000 MCG tablet Take 1 tablet (1,000 mcg total) by  mouth daily. 60 tablet 2   acetaminophen (TYLENOL) 500 MG tablet Take 500 mg by mouth every 6 (six) hours as needed for moderate pain. (Patient not taking: Reported on 01/06/2021)     amLODipine (NORVASC) 5 MG tablet Take 1 tablet (5 mg total) by mouth daily. 180 tablet 3   clopidogrel (PLAVIX) 75 MG tablet Take 1 tablet (75 mg total) by mouth daily. PLEASE SCHEDULE APPOINTMENT FOR FURTHER REFILLS. 2ND ATTEMPT (Patient not taking: Reported on 01/06/2021) 15 tablet 0   cyclobenzaprine (FLEXERIL) 10 MG tablet Take 1 tablet (10 mg total) by mouth 3 (three) times daily as needed for muscle spasms. (Patient not taking: Reported on 01/06/2021) 30 tablet 1   prochlorperazine (COMPAZINE) 10 MG tablet Take 1 tablet  (10 mg total) by mouth every 6 (six) hours as needed for nausea or vomiting. (Patient not taking: Reported on 01/06/2021) 30 tablet 3   prochlorperazine (COMPAZINE) 10 MG tablet Take 1 tablet (10 mg total) by mouth every 6 (six) hours as needed for nausea or vomiting. (Patient not taking: Reported on 01/06/2021) 30 tablet 0   No current facility-administered medications for this visit.    Physical Exam BP 126/79 (BP Location: Right Arm, Patient Position: Sitting)   Pulse 84   Resp 20   Ht 5\' 4"  (1.626 m)   Wt 130 lb (59 kg)   SpO2 95% Comment: RA  BMI 22.69 kg/m  60 year old woman in no acute distress Alert and oriented x3 with no focal deficits Lungs clear with equal breath sounds bilaterally Cardiac regular rate and rhythm Fullness in the left flank area with mild tenderness  Diagnostic Tests: CT CHEST WITH CONTRAST   TECHNIQUE: Multidetector CT imaging of the chest was performed during intravenous contrast administration.   CONTRAST:  72mL OMNIPAQUE IOHEXOL 300 MG/ML  SOLN   COMPARISON:  Chest CT 08/01/2020   FINDINGS: Cardiovascular: The heart is normal in size. No pericardial effusion. The aorta is normal in caliber. No dissection. Vessel the branch vessels are patent. Stable scattered coronary artery calcifications.   Mediastinum/Nodes: Progressive mediastinal lymphadenopathy. Right paratracheal lymph node on image 27/2 measures 10 mm and previously measured 4.5 mm.   Stable right paratracheal node on image 50/2 measuring 8.5 mm.   Subcarinal node on image 66/2 measures 12 mm and previously measured 8 mm.   Significant progression of left hilar adenopathy. Nodal mass on image number 70/2 measures 2.3 cm and previously measured 10 mm. This is associated with an enlarging left lower lobe mass.   Prevascular node on image 60/2 measures 8 mm and previously measured 5 mm.   Lungs/Pleura: Stable surgical changes involving the right upper lobe and right middle  lobe. No recurrent tumor in these areas. Enlarging left lower lobe lung mass and adjacent left hilar adenopathy. Findings consistent with progressive neoplasm. Maximum short axis diameter measures 2.1 cm on image 80/2. This was previously 1.5 cm.   Stable underlying emphysematous changes and pulmonary scarring. There are few scattered tiny peripheral pulmonary nodules which are stable.   New 4.5 mm left upper nodule on image 35/7.   No acute overlying pulmonary process.   Upper Abdomen: There is a 15 mm lesion in segment 7 on image 124/2. Suspect a 10 mm lesion in the right hepatic lobe on image 132/2.   Stable bilateral adrenal gland lesions. No upper abdominal lymphadenopathy.   Musculoskeletal: No lytic or sclerotic bone lesions are identified.   IMPRESSION: 1. Progressive mediastinal and left hilar lymphadenopathy. 2. Enlarging  left lower lobe lung mass and adjacent left hilar adenopathy. 3. New 4.5 mm left upper lobe pulmonary nodule. 4. Suspect new hepatic metastatic disease. 5. Recommend PET-CT for further evaluation and restaging. 6. Stable surgical changes involving the right upper lobe and right middle lobe. No recurrent tumor in these areas. 7. Stable emphysematous changes and pulmonary scarring. 8. Stable bilateral adrenal gland lesions.   Aortic Atherosclerosis (ICD10-I70.0) and Emphysema (ICD10-J43.9).     Electronically Signed   By: Marijo Sanes M.D.   On: 01/05/2021 09:33 I personally reviewed the CT images and concur with the findings noted above  Impression: Connie Farrell is a 60 year old woman with a history of tobacco abuse, lung cancer, hypertension, hyperlipidemia, ASCVD, PAD, renal artery stenosis, reflux, arthritis, macrocytic anemia, and anxiety.  She was first noted to have a lung nodule on a low-dose screening CT in 2019.  Over time that grew when she developed other abnormalities.  She had a robotic right VATS for posterior upper lobe  segmentectomy, middle lobe wedge resection, and node dissection.  Pathology showed T4, N1, stage IIIa adenocarcinoma.  She had abnormalities on the left side.  Bronchoscopic biopsies initially have been negative.  Over time however those have progressed and are consistent with cancer as well.  She is scheduled to start chemotherapy with carboplatin and pemetrexed as well as Keytruda next week.  She is scheduled to have a PET.  Hopefully that will shed some light on what is going on in the left flank as well.  Plan: Follow-up with Dr. Julien Nordmann and Dr. Nevada Crane I will be happy to see Mrs. Bristol back anytime in the future if I can be of any further assistance with her care.  [name], MD [facility] [phone]

## 2021-01-08 ENCOUNTER — Telehealth: Payer: Self-pay

## 2021-01-08 NOTE — Telephone Encounter (Signed)
Spoke with pt's daughter to confirm that pt's scan is authorized. Phone number for central scheduling given. Pt's daughter agrees with plan of care and verbalizes understanding to call and schedule scan.

## 2021-01-08 NOTE — Telephone Encounter (Signed)
Returned call as requested regarding pt's authorization for ordered nuclear med PET scan. Per [facility], case is still being reviewed. Radiation scheduling verbalized understanding.

## 2021-01-12 ENCOUNTER — Other Ambulatory Visit: Payer: Self-pay

## 2021-01-12 ENCOUNTER — Inpatient Hospital Stay: Payer: Commercial Managed Care - PPO

## 2021-01-12 DIAGNOSIS — Z5112 Encounter for antineoplastic immunotherapy: Secondary | ICD-10-CM | POA: Diagnosis not present

## 2021-01-12 DIAGNOSIS — C3491 Malignant neoplasm of unspecified part of right bronchus or lung: Secondary | ICD-10-CM

## 2021-01-12 LAB — CMP (CANCER CENTER ONLY)
ALT: 10 U/L (ref 0–44)
AST: 8 U/L — ABNORMAL LOW (ref 15–41)
Albumin: 3 g/dL — ABNORMAL LOW (ref 3.5–5.0)
Alkaline Phosphatase: 207 U/L — ABNORMAL HIGH (ref 38–126)
Anion gap: 12 (ref 5–15)
BUN: 7 mg/dL (ref 6–20)
CO2: 25 mmol/L (ref 22–32)
Calcium: 9.5 mg/dL (ref 8.9–10.3)
Chloride: 93 mmol/L — ABNORMAL LOW (ref 98–111)
Creatinine: 0.96 mg/dL (ref 0.44–1.00)
GFR, Estimated: >60 mL/min (ref >60)
Glucose, Bld: 132 mg/dL — ABNORMAL HIGH (ref 70–99)
Potassium: 3.7 mmol/L (ref 3.5–5.1)
Sodium: 130 mmol/L — ABNORMAL LOW (ref 135–145)
Total Bilirubin: 0.7 mg/dL (ref 0.3–1.2)
Total Protein: 7 g/dL (ref 6.5–8.1)

## 2021-01-12 LAB — CBC WITH DIFFERENTIAL (CANCER CENTER ONLY)
Abs Immature Granulocytes: 0.03 10*3/uL (ref 0.00–0.07)
Basophils Absolute: 0 10*3/uL (ref 0.0–0.1)
Basophils Relative: 0 %
Eosinophils Absolute: 0.3 10*3/uL (ref 0.0–0.5)
Eosinophils Relative: 2 %
HCT: 39.7 % (ref 36.0–46.0)
Hemoglobin: 13.8 g/dL (ref 12.0–15.0)
Immature Granulocytes: 0 %
Lymphocytes Relative: 6 %
Lymphs Abs: 0.7 10*3/uL (ref 0.7–4.0)
MCH: 33.8 pg (ref 26.0–34.0)
MCHC: 34.8 g/dL (ref 30.0–36.0)
MCV: 97.3 fL (ref 80.0–100.0)
Monocytes Absolute: 0.5 10*3/uL (ref 0.1–1.0)
Monocytes Relative: 5 %
Neutro Abs: 9.2 10*3/uL — ABNORMAL HIGH (ref 1.7–7.7)
Neutrophils Relative %: 87 %
Platelet Count: 201 10*3/uL (ref 150–400)
RBC: 4.08 MIL/uL (ref 3.87–5.11)
RDW: 13.4 % (ref 11.5–15.5)
WBC Count: 10.6 10*3/uL — ABNORMAL HIGH (ref 4.0–10.5)
nRBC: 0 % (ref 0.0–0.2)

## 2021-01-12 LAB — TSH: TSH: 0.413 u[IU]/mL (ref 0.308–3.960)

## 2021-01-13 ENCOUNTER — Inpatient Hospital Stay: Payer: Commercial Managed Care - PPO

## 2021-01-13 VITALS — BP 108/77 | HR 88 | Temp 98.1°F | Resp 18

## 2021-01-13 DIAGNOSIS — Z5112 Encounter for antineoplastic immunotherapy: Secondary | ICD-10-CM | POA: Diagnosis not present

## 2021-01-13 DIAGNOSIS — C3491 Malignant neoplasm of unspecified part of right bronchus or lung: Secondary | ICD-10-CM

## 2021-01-13 MED ORDER — DEXAMETHASONE SODIUM PHOSPHATE 100 MG/10ML IJ SOLN
10.0000 mg | Freq: Once | INTRAMUSCULAR | Status: AC
Start: 2021-01-13 — End: 2021-01-13
  Administered 2021-01-13: 10 mg via INTRAVENOUS
  Filled 2021-01-13: qty 10

## 2021-01-13 MED ORDER — SODIUM CHLORIDE 0.9 % IV SOLN
500.0000 mg/m2 | Freq: Once | INTRAVENOUS | Status: AC
  Administered 2021-01-13: 800 mg via INTRAVENOUS
  Filled 2021-01-13: qty 20

## 2021-01-13 MED ORDER — PALONOSETRON HCL INJECTION 0.25 MG/5ML
0.2500 mg | Freq: Once | INTRAVENOUS | Status: AC
  Administered 2021-01-13: 0.25 mg via INTRAVENOUS

## 2021-01-13 MED ORDER — PALONOSETRON HCL INJECTION 0.25 MG/5ML
INTRAVENOUS | Status: AC
  Filled 2021-01-13: qty 5

## 2021-01-13 MED ORDER — SODIUM CHLORIDE 0.9 % IV SOLN
200.0000 mg | Freq: Once | INTRAVENOUS | Status: AC
  Administered 2021-01-13: 200 mg via INTRAVENOUS
  Filled 2021-01-13: qty 8

## 2021-01-13 MED ORDER — CARBOPLATIN CHEMO INJECTION 600 MG/60ML
420.0000 mg | Freq: Once | INTRAVENOUS | Status: AC
  Administered 2021-01-13: 420 mg via INTRAVENOUS
  Filled 2021-01-13: qty 42

## 2021-01-13 MED ORDER — SODIUM CHLORIDE 0.9 % IV SOLN
Freq: Once | INTRAVENOUS | Status: AC
  Filled 2021-01-13: qty 250

## 2021-01-13 MED ORDER — SODIUM CHLORIDE 0.9 % IV SOLN
150.0000 mg | Freq: Once | INTRAVENOUS | Status: AC
  Administered 2021-01-13: 150 mg via INTRAVENOUS
  Filled 2021-01-13: qty 150

## 2021-01-13 NOTE — Progress Notes (Signed)
Per Dr. Julien Nordmann use the calculated dose of 424mg  for Carboplatin. Communicated to [employer] in pharmacy.

## 2021-01-13 NOTE — Patient Instructions (Signed)
[facility]  Discharge Instructions: Thank you for choosing [facility] to provide your oncology and hematology care.   If you have a lab appointment with the [facility], please go directly to the [facility] and check in at the registration area.   Wear comfortable clothing and clothing appropriate for easy access to any Portacath or PICC line.   We strive to give you quality time with your provider. You may need to reschedule your appointment if you arrive late (15 or more minutes).  Arriving late affects you and other patients whose appointments are after yours.  Also, if you miss three or more appointments without notifying the office, you may be dismissed from the clinic at the provider's discretion.      For prescription refill requests, have your pharmacy contact our office and allow 72 hours for refills to be completed.    Today you received the following chemotherapy and/or immunotherapy agents : Keytruda, Alimta, Carboplatin   To help prevent nausea and vomiting after your treatment, we encourage you to take your nausea medication as directed.  BELOW ARE SYMPTOMS THAT SHOULD BE REPORTED IMMEDIATELY: *FEVER GREATER THAN 100.4 F (38 C) OR HIGHER *CHILLS OR SWEATING *NAUSEA AND VOMITING THAT IS NOT CONTROLLED WITH YOUR NAUSEA MEDICATION *UNUSUAL SHORTNESS OF BREATH *UNUSUAL BRUISING OR BLEEDING *URINARY PROBLEMS (pain or burning when urinating, or frequent urination) *BOWEL PROBLEMS (unusual diarrhea, constipation, pain near the anus) TENDERNESS IN MOUTH AND THROAT WITH OR WITHOUT PRESENCE OF ULCERS (sore throat, sores in mouth, or a toothache) UNUSUAL RASH, SWELLING OR PAIN  UNUSUAL VAGINAL DISCHARGE OR ITCHING   Items with * indicate a potential emergency and should be followed up as soon as possible or go to the Emergency Department if any problems should occur.  Please show the CHEMOTHERAPY ALERT CARD or IMMUNOTHERAPY ALERT  CARD at check-in to the Emergency Department and triage nurse.  Should you have questions after your visit or need to cancel or reschedule your appointment, please contact [facility]  Dept: [phone]  and follow the prompts.  Office hours are 8:00 a.m. to 4:30 p.m. Monday - Friday. Please note that voicemails left after 4:00 p.m. may not be returned until the following business day.  We are closed weekends and major holidays. You have access to a nurse at all times for urgent questions. Please call the main number to the clinic Dept: [phone] and follow the prompts.   For any non-urgent questions, you may also contact your provider using MyChart. We now offer e-Visits for anyone 53 and older to request care online for non-urgent symptoms. For details visit [email or web address].   Also download the MyChart app! Go to the app store, search "MyChart", open the app, select [facility], and log in with your MyChart username and password.  Due to Covid, a mask is required upon entering the hospital/clinic. If you do not have a mask, one will be given to you upon arrival. For doctor visits, patients may have 1 support person aged 44 or older with them. For treatment visits, patients cannot have anyone with them due to current Covid guidelines and our immunocompromised population.   Pembrolizumab injection What is this medication? PEMBROLIZUMAB (pem broe liz ue mab) is a monoclonal antibody. It is used totreat certain types of cancer. This medicine may be used for other purposes; ask your health care provider orpharmacist if you have questions. COMMON BRAND NAME(S): Keytruda What should I  tell my care team before I take this medication? They need to know if you have any of these conditions: autoimmune diseases like Crohn's disease, ulcerative colitis, or lupus have had or planning to have an allogeneic stem cell transplant (uses someone else's stem cells) history  of organ transplant history of chest radiation nervous system problems like myasthenia gravis or Guillain-Barre syndrome an unusual or allergic reaction to pembrolizumab, other medicines, foods, dyes, or preservatives pregnant or trying to get pregnant breast-feeding How should I use this medication? This medicine is for infusion into a vein. It is given by a health careprofessional in a hospital or clinic setting. A special MedGuide will be given to you before each treatment. Be sure to readthis information carefully each time. Talk to your pediatrician regarding the use of this medicine in children. While this drug may be prescribed for children as young as 6 months for selectedconditions, precautions do apply. Overdosage: If you think you have taken too much of this medicine contact apoison control center or emergency room at once. NOTE: This medicine is only for you. Do not share this medicine with others. What if I miss a dose? It is important not to miss your dose. Call your doctor or health careprofessional if you are unable to keep an appointment. What may interact with this medication? Interactions have not been studied. This list may not describe all possible interactions. Give your health care provider a list of all the medicines, herbs, non-prescription drugs, or dietary supplements you use. Also tell them if you smoke, drink alcohol, or use illegaldrugs. Some items may interact with your medicine. What should I watch for while using this medication? Your condition will be monitored carefully while you are receiving thismedicine. You may need blood work done while you are taking this medicine. Do not become pregnant while taking this medicine or for 4 months after stopping it. Women should inform their doctor if they wish to become pregnant or think they might be pregnant. There is a potential for serious side effects to an unborn child. Talk to your health care professional or  pharmacist for more information. Do not breast-feed an infant while taking this medicine orfor 4 months after the last dose. What side effects may I notice from receiving this medication? Side effects that you should report to your doctor or health care professionalas soon as possible: allergic reactions like skin rash, itching or hives, swelling of the face, lips, or tongue bloody or black, tarry breathing problems changes in vision chest pain chills confusion constipation cough diarrhea dizziness or feeling faint or lightheaded fast or irregular heartbeat fever flushing joint pain low blood counts - this medicine may decrease the number of white blood cells, red blood cells and platelets. You may be at increased risk for infections and bleeding. muscle pain muscle weakness pain, tingling, numbness in the hands or feet persistent headache redness, blistering, peeling or loosening of the skin, including inside the mouth signs and symptoms of high blood sugar such as dizziness; dry mouth; dry skin; fruity breath; nausea; stomach pain; increased hunger or thirst; increased urination signs and symptoms of kidney injury like trouble passing urine or change in the amount of urine signs and symptoms of liver injury like dark urine, light-colored stools, loss of appetite, nausea, right upper belly pain, yellowing of the eyes or skin sweating swollen lymph nodes weight loss Side effects that usually do not require medical attention (report to yourdoctor or health care professional if they continue  or are bothersome): decreased appetite hair loss tiredness This list may not describe all possible side effects. Call your doctor for medical advice about side effects. You may report side effects to FDA at1-800-FDA-1088. Where should I keep my medication? This drug is given in a hospital or clinic and will not be stored at home. NOTE: This sheet is a summary. It may not cover all possible  information. If you have questions about this medicine, talk to your doctor, pharmacist, orhealth care provider.  2022 Elsevier/Gold Standard (2019-06-06 21:44:53)  Pemetrexed injection What is this medication? PEMETREXED (PEM e TREX ed) is a chemotherapy drug used to treat lung cancers like non-small cell lung cancer and mesothelioma. It may also be used to treatother cancers. This medicine may be used for other purposes; ask your health care provider orpharmacist if you have questions. COMMON BRAND NAME(S): Alimta What should I tell my care team before I take this medication? They need to know if you have any of these conditions: infection (especially a virus infection such as chickenpox, cold sores, or herpes) kidney disease low blood counts, like low white cell, platelet, or red cell counts lung or breathing disease, like asthma radiation therapy an unusual or allergic reaction to pemetrexed, other medicines, foods, dyes, or preservative pregnant or trying to get pregnant breast-feeding How should I use this medication? This drug is given as an infusion into a vein. It is administered in a hospitalor clinic by a specially trained health care professional. Talk to your pediatrician regarding the use of this medicine in children.Special care may be needed. Overdosage: If you think you have taken too much of this medicine contact apoison control center or emergency room at once. NOTE: This medicine is only for you. Do not share this medicine with others. What if I miss a dose? It is important not to miss your dose. Call your doctor or health careprofessional if you are unable to keep an appointment. What may interact with this medication? This medicine may interact with the following medications: Ibuprofen This list may not describe all possible interactions. Give your health care provider a list of all the medicines, herbs, non-prescription drugs, or dietary supplements you use. Also  tell them if you smoke, drink alcohol, or use illegaldrugs. Some items may interact with your medicine. What should I watch for while using this medication? Visit your doctor for checks on your progress. This drug may make you feel generally unwell. This is not uncommon, as chemotherapy can affect healthy cells as well as cancer cells. Report any side effects. Continue your course oftreatment even though you feel ill unless your doctor tells you to stop. In some cases, you may be given additional medicines to help with side effects.Follow all directions for their use. Call your doctor or health care professional for advice if you get a fever, chills or sore throat, or other symptoms of a cold or flu. Do not treat yourself. This drug decreases your body's ability to fight infections. Try toavoid being around people who are sick. This medicine may increase your risk to bruise or bleed. Call your doctor orhealth care professional if you notice any unusual bleeding. Be careful brushing and flossing your teeth or using a toothpick because you may get an infection or bleed more easily. If you have any dental work done,tell your dentist you are receiving this medicine. Avoid taking products that contain aspirin, acetaminophen, ibuprofen, naproxen, or ketoprofen unless instructed by your doctor. These medicines may hide afever.  Call your doctor or health care professional if you get diarrhea or mouthsores. Do not treat yourself. To protect your kidneys, drink water or other fluids as directed while you aretaking this medicine. Do not become pregnant while taking this medicine or for 6 months after stopping it. Women should inform their doctor if they wish to become pregnant or think they might be pregnant. Men should not father a child while taking this medicine and for 3 months after stopping it. This may interfere with the ability to father a child. You should talk to your doctor or health care professional if  you are concerned about your fertility. There is a potential for serious side effects to an unborn child. Talk to your health care professional or pharmacist for more information. Do not breast-feed an infantwhile taking this medicine or for 1 week after stopping it. What side effects may I notice from receiving this medication? Side effects that you should report to your doctor or health care professionalas soon as possible: allergic reactions like skin rash, itching or hives, swelling of the face, lips, or tongue breathing problems redness, blistering, peeling or loosening of the skin, including inside the mouth signs and symptoms of bleeding such as bloody or black, tarry stools; red or dark-brown urine; spitting up blood or brown material that looks like coffee grounds; red spots on the skin; unusual bruising or bleeding from the eye, gums, or nose signs and symptoms of infection like fever or chills; cough; sore throat; pain or trouble passing urine signs and symptoms of kidney injury like trouble passing urine or change in the amount of urine signs and symptoms of liver injury like dark yellow or brown urine; general ill feeling or flu-like symptoms; light-colored stools; loss of appetite; nausea; right upper belly pain; unusually weak or tired; yellowing of the eyes or skin Side effects that usually do not require medical attention (report to yourdoctor or health care professional if they continue or are bothersome): constipation mouth sores nausea, vomiting unusually weak or tired This list may not describe all possible side effects. Call your doctor for medical advice about side effects. You may report side effects to FDA at1-800-FDA-1088. Where should I keep my medication? This drug is given in a hospital or clinic and will not be stored at home. NOTE: This sheet is a summary. It may not cover all possible information. If you have questions about this medicine, talk to your doctor,  pharmacist, orhealth care provider.  2022 Elsevier/Gold Standard (2017-08-24 16:11:33)  Carboplatin injection What is this medication? CARBOPLATIN (KAR boe pla tin) is a chemotherapy drug. It targets fast dividing cells, like cancer cells, and causes these cells to die. This medicine is usedto treat ovarian cancer and many other cancers. This medicine may be used for other purposes; ask your health care provider orpharmacist if you have questions. COMMON BRAND NAME(S): Paraplatin What should I tell my care team before I take this medication? They need to know if you have any of these conditions: blood disorders hearing problems kidney disease recent or ongoing radiation therapy an unusual or allergic reaction to carboplatin, cisplatin, other chemotherapy, other medicines, foods, dyes, or preservatives pregnant or trying to get pregnant breast-feeding How should I use this medication? This drug is usually given as an infusion into a vein. It is administered in [facility] or clinic by a specially trained health care professional. Talk to your pediatrician regarding the use of this medicine in children.Special care may be needed. Overdosage: If  you think you have taken too much of this medicine contact apoison control center or emergency room at once. NOTE: This medicine is only for you. Do not share this medicine with others. What if I miss a dose? It is important not to miss a dose. Call your doctor or health careprofessional if you are unable to keep an appointment. What may interact with this medication? medicines for seizures medicines to increase blood counts like filgrastim, pegfilgrastim, sargramostim some antibiotics like amikacin, gentamicin, neomycin, streptomycin, tobramycin vaccines Talk to your doctor or health care professional before taking any of thesemedicines: acetaminophen aspirin ibuprofen ketoprofen naproxen This list may not describe all possible interactions.  Give your health care provider a list of all the medicines, herbs, non-prescription drugs, or dietary supplements you use. Also tell them if you smoke, drink alcohol, or use illegaldrugs. Some items may interact with your medicine. What should I watch for while using this medication? Your condition will be monitored carefully while you are receiving this medicine. You will need important blood work done while you are taking thismedicine. This drug may make you feel generally unwell. This is not uncommon, as chemotherapy can affect healthy cells as well as cancer cells. Report any side effects. Continue your course of treatment even though you feel ill unless yourdoctor tells you to stop. In some cases, you may be given additional medicines to help with side effects.Follow all directions for their use. Call your doctor or health care professional for advice if you get a fever, chills or sore throat, or other symptoms of a cold or flu. Do not treat yourself. This drug decreases your body's ability to fight infections. Try toavoid being around people who are sick. This medicine may increase your risk to bruise or bleed. Call your doctor orhealth care professional if you notice any unusual bleeding. Be careful brushing and flossing your teeth or using a toothpick because you may get an infection or bleed more easily. If you have any dental work done,tell your dentist you are receiving this medicine. Avoid taking products that contain aspirin, acetaminophen, ibuprofen, naproxen, or ketoprofen unless instructed by your doctor. These medicines may hide afever. Do not become pregnant while taking this medicine. Women should inform their doctor if they wish to become pregnant or think they might be pregnant. There is a potential for serious side effects to an unborn child. Talk to your health care professional or pharmacist for more information. Do not breast-feed aninfant while taking this medicine. What side  effects may I notice from receiving this medication? Side effects that you should report to your doctor or health care professionalas soon as possible: allergic reactions like skin rash, itching or hives, swelling of the face, lips, or tongue signs of infection - fever or chills, cough, sore throat, pain or difficulty passing urine signs of decreased platelets or bleeding - bruising, pinpoint red spots on the skin, black, tarry stools, nosebleeds signs of decreased red blood cells - unusually weak or tired, fainting spells, lightheadedness breathing problems changes in hearing changes in vision chest pain high blood pressure low blood counts - This drug may decrease the number of white blood cells, red blood cells and platelets. You may be at increased risk for infections and bleeding. nausea and vomiting pain, swelling, redness or irritation at the injection site pain, tingling, numbness in the hands or feet problems with balance, talking, walking trouble passing urine or change in the amount of urine Side effects that usually do not  require medical attention (report to yourdoctor or health care professional if they continue or are bothersome): hair loss loss of appetite metallic taste in the mouth or changes in taste This list may not describe all possible side effects. Call your doctor for medical advice about side effects. You may report side effects to FDA at1-800-FDA-1088. Where should I keep my medication? This drug is given in a hospital or clinic and will not be stored at home. NOTE: This sheet is a summary. It may not cover all possible information. If you have questions about this medicine, talk to your doctor, pharmacist, orhealth care provider.  2022 Elsevier/Gold Standard (2007-10-10 14:38:05)

## 2021-01-21 ENCOUNTER — Other Ambulatory Visit: Payer: Self-pay

## 2021-01-21 ENCOUNTER — Inpatient Hospital Stay: Payer: Commercial Managed Care - PPO

## 2021-01-21 ENCOUNTER — Inpatient Hospital Stay: Payer: Commercial Managed Care - PPO | Attending: Internal Medicine | Admitting: Internal Medicine

## 2021-01-21 VITALS — BP 130/81 | HR 89 | Temp 96.2°F | Resp 18 | Wt 124.0 lb

## 2021-01-21 DIAGNOSIS — Z79899 Other long term (current) drug therapy: Secondary | ICD-10-CM | POA: Diagnosis not present

## 2021-01-21 DIAGNOSIS — C342 Malignant neoplasm of middle lobe, bronchus or lung: Secondary | ICD-10-CM | POA: Diagnosis present

## 2021-01-21 DIAGNOSIS — C7951 Secondary malignant neoplasm of bone: Secondary | ICD-10-CM | POA: Insufficient documentation

## 2021-01-21 DIAGNOSIS — C786 Secondary malignant neoplasm of retroperitoneum and peritoneum: Secondary | ICD-10-CM | POA: Diagnosis not present

## 2021-01-21 DIAGNOSIS — C3491 Malignant neoplasm of unspecified part of right bronchus or lung: Secondary | ICD-10-CM

## 2021-01-21 DIAGNOSIS — R112 Nausea with vomiting, unspecified: Secondary | ICD-10-CM | POA: Diagnosis not present

## 2021-01-21 DIAGNOSIS — C787 Secondary malignant neoplasm of liver and intrahepatic bile duct: Secondary | ICD-10-CM | POA: Insufficient documentation

## 2021-01-21 DIAGNOSIS — Z5112 Encounter for antineoplastic immunotherapy: Secondary | ICD-10-CM

## 2021-01-21 DIAGNOSIS — Z5111 Encounter for antineoplastic chemotherapy: Secondary | ICD-10-CM

## 2021-01-21 LAB — CBC WITH DIFFERENTIAL (CANCER CENTER ONLY)
Abs Immature Granulocytes: 0.02 10*3/uL (ref 0.00–0.07)
Basophils Absolute: 0 10*3/uL (ref 0.0–0.1)
Basophils Relative: 1 %
Eosinophils Absolute: 0.1 10*3/uL (ref 0.0–0.5)
Eosinophils Relative: 5 %
HCT: 35.8 % — ABNORMAL LOW (ref 36.0–46.0)
Hemoglobin: 12.3 g/dL (ref 12.0–15.0)
Immature Granulocytes: 1 %
Lymphocytes Relative: 25 %
Lymphs Abs: 0.7 10*3/uL (ref 0.7–4.0)
MCH: 33.6 pg (ref 26.0–34.0)
MCHC: 34.4 g/dL (ref 30.0–36.0)
MCV: 97.8 fL (ref 80.0–100.0)
Monocytes Absolute: 0.2 10*3/uL (ref 0.1–1.0)
Monocytes Relative: 6 %
Neutro Abs: 1.6 10*3/uL — ABNORMAL LOW (ref 1.7–7.7)
Neutrophils Relative %: 62 %
Platelet Count: 60 10*3/uL — ABNORMAL LOW (ref 150–400)
RBC: 3.66 MIL/uL — ABNORMAL LOW (ref 3.87–5.11)
RDW: 13.4 % (ref 11.5–15.5)
WBC Count: 2.6 10*3/uL — ABNORMAL LOW (ref 4.0–10.5)
nRBC: 0 % (ref 0.0–0.2)

## 2021-01-21 LAB — CMP (CANCER CENTER ONLY)
ALT: 14 U/L (ref 0–44)
AST: 20 U/L (ref 15–41)
Albumin: 3.1 g/dL — ABNORMAL LOW (ref 3.5–5.0)
Alkaline Phosphatase: 179 U/L — ABNORMAL HIGH (ref 38–126)
Anion gap: 11 (ref 5–15)
BUN: 7 mg/dL (ref 6–20)
CO2: 25 mmol/L (ref 22–32)
Calcium: 9.8 mg/dL (ref 8.9–10.3)
Chloride: 99 mmol/L (ref 98–111)
Creatinine: 0.73 mg/dL (ref 0.44–1.00)
GFR, Estimated: >60 mL/min (ref >60)
Glucose, Bld: 95 mg/dL (ref 70–99)
Potassium: 4.5 mmol/L (ref 3.5–5.1)
Sodium: 135 mmol/L (ref 135–145)
Total Bilirubin: 0.6 mg/dL (ref 0.3–1.2)
Total Protein: 7.2 g/dL (ref 6.5–8.1)

## 2021-01-21 LAB — TSH: TSH: 0.444 u[IU]/mL (ref 0.308–3.960)

## 2021-01-21 NOTE — Progress Notes (Signed)
[place] [phone]   [phone]  OFFICE PROGRESS NOTE  Connie Squibb, MD [address] [place] [address]  DIAGNOSIS: Metastatic non-small cell lung cancer, adenocarcinoma that was initially diagnosed as stage IIIA (T4, N1, M0) non-small cell lung cancer, adenocarcinoma in November 2021 status post right upper lobe segmentectomy in addition to wedge resection of the right middle lobe with lymph node dissection.  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 She declined adjuvant systemic chemotherapy.  CURRENT THERAPY: Systemic chemotherapy with carboplatin for AUC of 5, Alimta 500 Mg/M2 and Keytruda 200 Mg IV every 3 weeks.  First dose January 13, 2021.  INTERVAL HISTORY: Connie Farrell 60 y.o. female returns to the clinic today for follow-up visit accompanied by her daughter, Andee Poles.  The patient started the first cycle of her systemic chemotherapy with carboplatin, Alimta and Keytruda last week.  She tolerated the first week of her treatment well with no concerning adverse effect except for mild fatigue and she has occasional panic attack.  She continues to have pain on the back more to the left side.  She denied having any current nausea, vomiting, diarrhea or constipation.  She has no headache or visual changes.  She has no weight loss or night sweats.  She is scheduled to have a PET scan on February 02, 2021.  She is here today for evaluation 1 week after her treatment.  MEDICAL HISTORY: Past Medical History:  Diagnosis Date   Anxiety    Arthritis    "hands; spine too" (04/04/2012)   Cancer (HCC)    COPD (chronic obstructive pulmonary disease) (HCC)    Exertional dyspnea    Folate deficiency 10/17/2015   GERD (gastroesophageal reflux disease)    Hyperlipemia     Hypertension    Macrocytosis 10/17/2015   Neck pain, chronic    PAD (peripheral artery disease) (HCC)    Renal artery stenosis (HCC)    Renal disorder    Seasonal allergies    Tobacco abuse     ALLERGIES:  is allergic to latex and codeine.  MEDICATIONS:  Current Outpatient Medications  Medication Sig Dispense Refill   acetaminophen (TYLENOL) 500 MG tablet Take 500 mg by mouth every 6 (six) hours as needed for moderate pain. (Patient not taking: Reported on 01/06/2021)     albuterol (VENTOLIN HFA) 108 (90 Base) MCG/ACT inhaler Inhale 1-2 puffs into the lungs every 6 (six) hours as needed for wheezing or shortness of breath.     amLODipine (NORVASC) 5 MG tablet Take 1 tablet (5 mg total) by mouth daily. 180 tablet 3   Budeson-Glycopyrrol-Formoterol (BREZTRI AEROSPHERE) 160-9-4.8 MCG/ACT AERO Inhale 2 puffs into the lungs in the morning and at bedtime. Take 2 puffs first thing in am and then another 2 puffs about 12 hours later. (Patient taking differently: Inhale 2 puffs into the lungs in the morning and at bedtime.) 10.7 g 11   clopidogrel (PLAVIX) 75 MG tablet Take 1 tablet (75 mg total) by mouth daily. PLEASE SCHEDULE APPOINTMENT FOR FURTHER REFILLS. 2ND ATTEMPT (Patient not taking: Reported on 01/06/2021) 15 tablet 0   cyclobenzaprine (FLEXERIL) 10 MG tablet Take 1 tablet (10 mg total) by mouth 3 (three) times daily as needed for muscle spasms. (Patient not taking: Reported on 01/06/2021) 30 tablet  1   dexlansoprazole (DEXILANT) 60 MG capsule Take 1 capsule (60 mg total) by mouth daily. 90 capsule 1   escitalopram (LEXAPRO) 10 MG tablet Take 10 mg by mouth daily.      folic acid (FOLVITE) 1 MG tablet Take 1 mg by mouth daily.     folic acid (FOLVITE) 1 MG tablet Take 1 tablet (1 mg total) by mouth daily. 30 tablet 4   metoprolol tartrate (LOPRESSOR) 25 MG tablet Take 1 tablet (25 mg total) by mouth 2 (two) times daily as needed (afib). 60 tablet 0   oxyCODONE-acetaminophen (PERCOCET/ROXICET)  5-325 MG tablet Take 1 tablet by mouth every 6 (six) hours as needed for severe pain. 30 tablet 0   prochlorperazine (COMPAZINE) 10 MG tablet Take 1 tablet (10 mg total) by mouth every 6 (six) hours as needed for nausea or vomiting. (Patient not taking: Reported on 01/06/2021) 30 tablet 3   prochlorperazine (COMPAZINE) 10 MG tablet Take 1 tablet (10 mg total) by mouth every 6 (six) hours as needed for nausea or vomiting. (Patient not taking: Reported on 01/06/2021) 30 tablet 0   rosuvastatin (CRESTOR) 20 MG tablet Take 20 mg by mouth daily.      vitamin B-12 (CYANOCOBALAMIN) 1000 MCG tablet Take 1 tablet (1,000 mcg total) by mouth daily. 60 tablet 2   No current facility-administered medications for this visit.    SURGICAL HISTORY:  Past Surgical History:  Procedure Laterality Date   ABDOMINAL ANGIOGRAM  04/04/2012   Procedure: ABDOMINAL ANGIOGRAM;  Surgeon: Lorretta Harp, MD;  Location[facility] CATH LAB;  Service: Cardiovascular;;   ANTERIOR CERVICAL DECOMP/DISCECTOMY FUSION  ~ 04/2006   C6, 7, T1   APPENDECTOMY     COLONOSCOPY N/A 02/26/2014   Adequate preparation. Minimal anal canal hemorrhoids;otherwise, normal rectumdiminutive polyps in the middescending segment; otherwise, the remainder of colonic mucosaappeared normal. Hyperplastic polyps.   ESOPHAGOGASTRODUODENOSCOPY N/A 02/26/2014   IWP:[id number] distal esophagus, query short segment Barrett's. Status post Venia Minks dilation with esophageal biopsy. Abnormal gastric mucosa. Esophageal biopsy negative for Barrett's. Gastric biopsy showed mild chronic gastritis, no H pylori.   INTERCOSTAL NERVE BLOCK Right 06/16/2020   Procedure: INTERCOSTAL NERVE BLOCK;  Surgeon: [name], MD;  Location: [place];  Service: Thoracic;  Laterality: Right;   LAPAROSCOPIC APPENDECTOMY N/A 09/10/2013   Procedure: APPENDECTOMY LAPAROSCOPIC;  Surgeon: Jamesetta So, MD;  Location: [facility] ORS;  Service: General;  Laterality: N/A;   LOWER EXTREMITY ANGIOGRAM N/A  04/04/2012   Procedure: LOWER EXTREMITY ANGIOGRAM;  Surgeon: Lorretta Harp, MD;  Location: [facility] CATH LAB;  Service: Cardiovascular;  Laterality: N/A;   MALONEY DILATION N/A 02/26/2014   Procedure: Venia Minks DILATION;  Surgeon: Daneil Dolin, MD;  Location: [facility] ENDO SU[facility];  Service: Endoscopy;  Laterality: N/A;   NODE DISSECTION Right 06/16/2020   Procedure: NODE DISSECTION;  Surgeon: [name], MD;  Location: [place];  Service: Thoracic;  Laterality: Right;   PERIPHERAL ARTERIAL STENT GRAFT  04/04/2012   RENAL ANGIOGRAM Left 04/22/2014   Procedure: RENAL ANGIOGRAM;  Surgeon: Lorretta Harp, MD;  Location: [facility] CATH LAB;  Service: Cardiovascular;  Laterality: Left;   [place]   VASCULAR SURGERY  03/31/2016   Peripheral arterial stent   VIDEO BRONCHOSCOPY WITH ENDOBRONCHIAL NAVIGATION N/A 04/28/2020   Procedure: VIDEO BRONCHOSCOPY WITH ENDOBRONCHIAL NAVIGATION;  Surgeon: [name], MD;  Location: [place];  Service: Thoracic;  Laterality: N/A;   VIDEO BRONCHOSCOPY WITH ENDOBRONCHIAL NAVIGATION N/A 06/16/2020   Procedure:  VIDEO BRONCHOSCOPY WITH ENDOBRONCHIAL NAVIGATION;  Surgeon: [name], MD;  Location: [facility] OR;  Service: Thoracic;  Laterality: N/A;    REVIEW OF SYSTEMS:  A comprehensive review of systems was negative except for: Constitutional: positive for fatigue Respiratory: positive for dyspnea on exertion Musculoskeletal: positive for back pain   PHYSICAL EXAMINATION: General appearance: alert, cooperative, appears stated age, fatigued, and no distress Head: Normocephalic, without obvious abnormality, atraumatic Neck: no adenopathy, no JVD, supple, symmetrical, trachea midline, and thyroid not enlarged, symmetric, no tenderness/mass/nodules Lymph nodes: Cervical, supraclavicular, and axillary nodes normal. Resp: clear to auscultation bilaterally Back: symmetric, no curvature. ROM normal. No CVA tenderness. Cardio: regular rate and rhythm, S1, S2  normal, no murmur, click, rub or gallop GI: soft, non-tender; bowel sounds normal; no masses,  no organomegaly Extremities: extremities normal, atraumatic, no cyanosis or edema  ECOG PERFORMANCE STATUS: 1 - Symptomatic but completely ambulatory  Blood pressure 130/81, pulse 89, temperature (!) 96.2 F (35.7 C), temperature source Tympanic, resp. rate 18, weight 124 lb (56.2 kg), SpO2 98 %.  LABORATORY DATA: Lab Results  Component Value Date   WBC 2.6 (L) 01/21/2021   HGB 12.3 01/21/2021   HCT 35.8 (L) 01/21/2021   MCV 97.8 01/21/2021   PLT 60 (L) 01/21/2021      Chemistry      Component Value Date/Time   NA 130 (L) 01/12/2021 0946   K 3.7 01/12/2021 0946   CL 93 (L) 01/12/2021 0946   CO2 25 01/12/2021 0946   BUN 7 01/12/2021 0946   CREATININE 0.96 01/12/2021 0946   CREATININE 1.36 (H) 04/16/2014 1034      Component Value Date/Time   CALCIUM 9.5 01/12/2021 0946   ALKPHOS 207 (H) 01/12/2021 0946   AST 8 (L) 01/12/2021 0946   ALT 10 01/12/2021 0946   BILITOT 0.7 01/12/2021 0946       RADIOGRAPHIC STUDIES: CT Chest W Contrast  Result Date: 01/05/2021 CLINICAL DATA:  Non-small cell lung cancer. History of right middle lobe wedge resection and right upper lobe anterior segmentectomy. EXAM: CT CHEST WITH CONTRAST TECHNIQUE: Multidetector CT imaging of the chest was performed during intravenous contrast administration. CONTRAST:  76m OMNIPAQUE IOHEXOL 300 MG/ML  SOLN COMPARISON:  Chest CT 08/01/2020 FINDINGS: Cardiovascular: The heart is normal in size. No pericardial effusion. The aorta is normal in caliber. No dissection. Vessel the branch vessels are patent. Stable scattered coronary artery calcifications. Mediastinum/Nodes: Progressive mediastinal lymphadenopathy. Right paratracheal lymph node on image 27/2 measures 10 mm and previously measured 4.5 mm. Stable right paratracheal node on image 50/2 measuring 8.5 mm. Subcarinal node on image 66/2 measures 12 mm and previously  measured 8 mm. Significant progression of left hilar adenopathy. Nodal mass on image number 70/2 measures 2.3 cm and previously measured 10 mm. This is associated with an enlarging left lower lobe mass. Prevascular node on image 60/2 measures 8 mm and previously measured 5 mm. Lungs/Pleura: Stable surgical changes involving the right upper lobe and right middle lobe. No recurrent tumor in these areas. Enlarging left lower lobe lung mass and adjacent left hilar adenopathy. Findings consistent with progressive neoplasm. Maximum short axis diameter measures 2.1 cm on image 80/2. This was previously 1.5 cm. Stable underlying emphysematous changes and pulmonary scarring. There are few scattered tiny peripheral pulmonary nodules which are stable. New 4.5 mm left upper nodule on image 35/7. No acute overlying pulmonary process. Upper Abdomen: There is a 15 mm lesion in segment 7 on image 124/2. Suspect a 10  mm lesion in the right hepatic lobe on image 132/2. Stable bilateral adrenal gland lesions. No upper abdominal lymphadenopathy. Musculoskeletal: No lytic or sclerotic bone lesions are identified. IMPRESSION: 1. Progressive mediastinal and left hilar lymphadenopathy. 2. Enlarging left lower lobe lung mass and adjacent left hilar adenopathy. 3. New 4.5 mm left upper lobe pulmonary nodule. 4. Suspect new hepatic metastatic disease. 5. Recommend PET-CT for further evaluation and restaging. 6. Stable surgical changes involving the right upper lobe and right middle lobe. No recurrent tumor in these areas. 7. Stable emphysematous changes and pulmonary scarring. 8. Stable bilateral adrenal gland lesions. Aortic Atherosclerosis (ICD10-I70.0) and Emphysema (ICD10-J43.9). Electronically Signed   By: Marijo Sanes M.D.   On: 01/05/2021 09:33   CT Lumbar Spine Wo Contrast  Result Date: 12/28/2020 CLINICAL DATA:  Low back pain, cancer suspected EXAM: CT LUMBAR SPINE WITHOUT CONTRAST TECHNIQUE: Multidetector CT imaging of the  lumbar spine was performed without intravenous contrast administration. Multiplanar CT image reconstructions were also generated. COMPARISON:  PET-CT 04/14/2020, CT abdomen pelvis 02/04/2014 FINDINGS: Segmentation: 5 lumbar type vertebrae. Alignment: Normal. Vertebrae: There is no acute fracture or evidence of focal bone lesion. Severe degenerative endplate changes at V7-O1 and L5-S1. Paraspinal and other soft tissues: There is a 1.5 cm right adrenal nodule, previously measuring 1.3 cm on prior PET-CT and without abnormal metabolic activity, compatible with adenoma. Aorta bi-iliac atherosclerotic calcifications. Left common iliac stent, unchanged. Disc levels: Broad-based disc bulging and facet degenerative change results in mild spinal canal and bilateral neural foraminal narrowing at L3-L4. There is a posterior disc osteophyte complex at L4-L5 which results in mild-to-moderate canal narrowing and, moderate right and mild left neural foraminal narrowing. Posterior disc osteophyte complex and facet arthropathy at L5-S1 results in minimal canal narrowing, moderate right and left neural foraminal narrowing. IMPRESSION: No acute lumbar fracture.  No suspicious lytic or blastic lesion. Multilevel degenerative changes, worst at L4-L5 and L5-S1, with up moderate canal narrowing at L4-L5, moderate right neural foraminal narrowing at L4-L5, and moderate bilateral neural foraminal narrowing at L5-S1. Aortic Atherosclerosis (ICD10-I70.0). Electronically Signed   By: Maurine Simmering   On: 12/28/2020 12:20   CT PELVIS WO CONTRAST  Result Date: 12/28/2020 CLINICAL DATA:  left lower back pain/left posterior hip pain. hx of lung cancer. rule out mets EXAM: CT PELVIS WITHOUT CONTRAST TECHNIQUE: Multidetector CT imaging of the pelvis was performed following the standard protocol without intravenous contrast. COMPARISON:  None. FINDINGS: Urinary Tract:  No abnormality visualized. Bowel:  Unremarkable visualized pelvic bowel loops.  Vascular/Lymphatic: No pathologically enlarged lymph nodes. Aorta bi-iliac atherosclerotic calcifications. Reproductive:  No mass or other significant abnormality Other:  Fat containing left inguinal hernia. Musculoskeletal: No suspicious bone lesions identified. No acute osseous abnormality. IMPRESSION: No acute osseous abnormality. No suspicious lytic or blastic lesion. Aortic Atherosclerosis (ICD10-I70.0). Electronically Signed   By: Maurine Simmering   On: 12/28/2020 12:27   MR Brain W Wo Contrast  Result Date: 01/02/2021 CLINICAL DATA:  Non-small cell lung cancer staging. EXAM: MRI HEAD WITHOUT AND WITH CONTRAST TECHNIQUE: Multiplanar, multiecho pulse sequences of the brain and surrounding structures were obtained without and with intravenous contrast. CONTRAST:  87m GADAVIST GADOBUTROL 1 MMOL/ML IV SOLN COMPARISON:  CT head May 28, 2011. FINDINGS: Brain: Nonenhancing area of encephalomalacia in the anterior right basal ganglia with associated susceptibility artifact and surrounding gliosis, possibly the sequela of remote hemorrhagic infarct. No evidence of acute infarct or acute hemorrhage. No hydrocephalus. Mild to moderate scattered T2/FLAIR hyperintensities within the white  matter, likely the sequela of chronic microvascular ischemic disease. Punctate focus of enhancement in the inferolateral left parietal lobe (series 20, image 91; series 21, image 9). No mass lesion or abnormal mass effect. No extra-axial fluid collection. Vascular: Major arterial flow voids are maintained at the skull base. Skull and upper cervical spine: Normal marrow signal. Sinuses/Orbits: Clear sinuses.  Unremarkable orbits. Other: No mastoid effusions. IMPRESSION: 1. Punctate focus of enhancement in the in the inferolateral left parietal lobe (series 20, image 91; series 21, image 9) may represent a small cortical vessel, but warrants short interval follow-up MRI with contrast in approximately 4 weeks to ensure stability and  exclude metastatic disease. 2. Remote insult in the right basal ganglia, potentially a prior hemorrhagic infarct. 3. Mild to moderate chronic microvascular ischemic disease. Electronically Signed   By: Margaretha Sheffield MD   On: 01/02/2021 18:45    ASSESSMENT AND PLAN: This is a very pleasant 60 years old white female presented with metastatic non-small cell lung cancer, adenocarcinoma with positive K-ras G 12 C mutation that was initially diagnosed as stage IIIa (T4, N1, M0) adenocarcinoma in November 2021 status post right upper lobe segmentectomy in addition to mediastinal lymph node dissection under the care of [name].  The patient was seen by Dr. Delton Coombes and she declined adjuvant systemic chemotherapy at that time. She was followed by observation and she came a month ago to establish care with me. I ordered CT scan of the chest as well as MRI of the brain which were performed recently.  I personally and independently reviewed the scan images and discussed the results with the patient and her daughter. Unfortunately her scan showed evidence for disease recurrence presented with progressive mediastinal and left hilar adenopathy in addition to enlarging left lower lobe lung mass and adjacent left hilar adenopathy and new 4.5 mm left upper lobe pulmonary nodule and suspicious new hepatic metastasis. This finding are consistent with disease metastasis. She is scheduled to have a PET scan on February 02, 2021. The patient started systemic chemotherapy with carboplatin for AUC of 5, Alimta 500 Mg/M2 and Keytruda 200 Mg IV every 3 weeks status post 1 cycle.  She tolerated the first week of her treatment fairly well with no concerning adverse effect except for mild fatigue. I recommended for the patient to continue her treatment as planned and she is expected to start cycle #2 on February 02, 2021. We will try to change her appointment for the PET scan to avoid any conflict with her chemotherapy. For pain  management she will continue her current treatment with oxycodone for now in addition to Flexeril as prescribed by her primary care physician. The patient was advised to call immediately if she has any concerning symptoms in the interval. The patient voices understanding of current disease status and treatment options and is in agreement with the current care plan.  All questions were answered. The patient knows to call the clinic with any problems, questions or concerns. We can certainly see the patient much sooner if necessary. The total time spent in the appointment was 55 minutes.  Disclaimer: This note was dictated with voice recognition software. Similar sounding words can inadvertently be transcribed and may not be corrected upon review.

## 2021-01-22 ENCOUNTER — Ambulatory Visit: Payer: Commercial Managed Care - PPO

## 2021-01-26 ENCOUNTER — Ambulatory Visit (HOSPITAL_COMMUNITY)
Admission: RE | Admit: 2021-01-26 | Discharge: 2021-01-26 | Disposition: A | Discharge status: Home / Self Care | Payer: Commercial Managed Care - PPO | Source: Ambulatory Visit | Attending: Internal Medicine | Admitting: Internal Medicine

## 2021-01-26 ENCOUNTER — Other Ambulatory Visit: Payer: Self-pay

## 2021-01-26 DIAGNOSIS — Z1231 Encounter for screening mammogram for malignant neoplasm of breast: Secondary | ICD-10-CM | POA: Insufficient documentation

## 2021-01-27 ENCOUNTER — Inpatient Hospital Stay: Payer: Commercial Managed Care - PPO

## 2021-01-27 ENCOUNTER — Telehealth: Payer: Self-pay | Admitting: *Deleted

## 2021-01-27 DIAGNOSIS — C3491 Malignant neoplasm of unspecified part of right bronchus or lung: Secondary | ICD-10-CM

## 2021-01-27 DIAGNOSIS — C342 Malignant neoplasm of middle lobe, bronchus or lung: Secondary | ICD-10-CM | POA: Diagnosis not present

## 2021-01-27 LAB — CBC WITH DIFFERENTIAL (CANCER CENTER ONLY)
Abs Immature Granulocytes: 0 10*3/uL (ref 0.00–0.07)
Basophils Absolute: 0 10*3/uL (ref 0.0–0.1)
Basophils Relative: 0 %
Eosinophils Absolute: 0.1 10*3/uL (ref 0.0–0.5)
Eosinophils Relative: 4 %
HCT: 36.7 % (ref 36.0–46.0)
Hemoglobin: 12.6 g/dL (ref 12.0–15.0)
Immature Granulocytes: 0 %
Lymphocytes Relative: 31 %
Lymphs Abs: 0.8 10*3/uL (ref 0.7–4.0)
MCH: 33.6 pg (ref 26.0–34.0)
MCHC: 34.3 g/dL (ref 30.0–36.0)
MCV: 97.9 fL (ref 80.0–100.0)
Monocytes Absolute: 0.3 10*3/uL (ref 0.1–1.0)
Monocytes Relative: 13 %
Neutro Abs: 1.4 10*3/uL — ABNORMAL LOW (ref 1.7–7.7)
Neutrophils Relative %: 52 %
Platelet Count: 89 10*3/uL — ABNORMAL LOW (ref 150–400)
RBC: 3.75 MIL/uL — ABNORMAL LOW (ref 3.87–5.11)
RDW: 14.3 % (ref 11.5–15.5)
WBC Count: 2.6 10*3/uL — ABNORMAL LOW (ref 4.0–10.5)
nRBC: 0 % (ref 0.0–0.2)

## 2021-01-27 LAB — CMP (CANCER CENTER ONLY)
ALT: 10 U/L (ref 0–44)
AST: 9 U/L — ABNORMAL LOW (ref 15–41)
Albumin: 3 g/dL — ABNORMAL LOW (ref 3.5–5.0)
Alkaline Phosphatase: 230 U/L — ABNORMAL HIGH (ref 38–126)
Anion gap: 12 (ref 5–15)
BUN: 5 mg/dL — ABNORMAL LOW (ref 6–20)
CO2: 26 mmol/L (ref 22–32)
Calcium: 9.9 mg/dL (ref 8.9–10.3)
Chloride: 98 mmol/L (ref 98–111)
Creatinine: 0.99 mg/dL (ref 0.44–1.00)
GFR, Estimated: >60 mL/min (ref >60)
Glucose, Bld: 116 mg/dL — ABNORMAL HIGH (ref 70–99)
Potassium: 3.7 mmol/L (ref 3.5–5.1)
Sodium: 136 mmol/L (ref 135–145)
Total Bilirubin: 0.5 mg/dL (ref 0.3–1.2)
Total Protein: 7.4 g/dL (ref 6.5–8.1)

## 2021-01-27 NOTE — Telephone Encounter (Signed)
Connected with Bebe Liter to notify no action regarding letter forwarded by a Ike Bene to [email or web address] from a Ike Bene, D.O.B: 04/20/1964 followed by second e-mail without identifiable patient information.  Requested patient date of birth to select correct patient information to process.  E-mail subject includes a name without date of birth.

## 2021-01-27 NOTE — Telephone Encounter (Signed)
01/27/2021: FMLA completed awaiting provider signature.  01/22/2021 10:45 voicemail from [facility] confirming claim [id number] for associate Zeidan spouse, Nairi Oswald; D.O.B. [birth year]1962.     01/21/2021 at 1523. Registration delivered form to this nurse.  From Pincus Badder [phone]) listed as caregiver per FMLA cover sheet.  No patient date of birth provided.

## 2021-01-28 NOTE — Telephone Encounter (Signed)
Voicemail:  "Vanetta Rule [phone]) calling on behalf of TIMISHA MONDRY for information desk to get information on FMLA papers due by 15th I turned in on the 7th."

## 2021-01-30 NOTE — Progress Notes (Signed)
[facility] OFFICE PROGRESS NOTE  Celene Squibb, MD [address] [place] [address]  DIAGNOSIS: Metastatic non-small cell lung cancer, adenocarcinoma that was initially diagnosed as stage IIIA (T4, N1, M0) non-small cell lung cancer, adenocarcinoma in November 2021 status post right upper lobe segmentectomy in addition to wedge resection of the right middle lobe with lymph node dissection.   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 She declined adjuvant systemic chemotherapy.  CURRENT THERAPY: Systemic chemotherapy with carboplatin for AUC of 5, Alimta 500 Mg/M2 and Keytruda 200 Mg IV every 3 weeks.  First dose January 13, 2021. Status post 1 cycle.   INTERVAL HISTORY: DIANELYS SCINTO 60 y.o. female returns to the clinic today for a follow-up visit accompanied by her daughter.  The patient's husband was available by phone.  The patient is feeling fatigue today.  Last week on 01/27/2021, the patient was changing at home and had an episode of shortness of breath and lightheadedness.  She reportedly had a syncopal episode at home.  EMS was not called.  The patient used her inhalers.  She then followed up with her primary care provider a few days later.  They adjusted her blood pressure medicine and the prescribed Flexeril, Klonopin, medication for thrush, and a medication for shingles and told her to follow-up with oncology.  The patient then had a recurrent episode on 01/30/21 where she was in the bathroom and her family found her on the floor and was reportedly passed out for approximately 5 minutes.  EMS again was not called.  They ordered a pulse ox which only arrived after the episode.  They state that her oxygen saturation sometimes is in the high 90% range and with exertion it can get down to 90%.  The patient  denies any history of arrhythmias.  They have a blood pressure cuff at home but did not monitor her vitals during these episodes.  Her Norvasc is reportedly on hold.  She is only taking 12.5 mg of metoprolol at nighttime.  She did not take her metoprolol today yet.  She denies any calf pain, swelling, or tenderness.   Otherwise, the patient has lost a significant amount of weight since her last appointment.  She lost 5 pounds in 2 weeks.  The patient does not presently drink Ensure or boost.  The patient's husband is concerned about undergoing further chemotherapy and would like to know "what to expect".  They do not need any home health assistance as her family helps with activities of daily living.  The patient unfortunately resumed smoking and is smoking approximately half a pack a day.  She denies any recent fever or night sweats.  She denies any headache or visual changes.  The patient's breathing is similar to her last appointment.  She reports a baseline cough which produces phlegm and dyspnea on exertion.  She denies any chest pain or hemoptysis. She has several inhalers and nebulizer which she is compliant with. She denies any rashes or skin changes.  The patient is currently on oxycodone and Flexeril for her back pain which is prescribed by her PCP.  The patient is here today for evaluation before starting cycle #2.  She is scheduled for restaging PET scan on 02/03/21.     MEDICAL HISTORY: Past Medical History:  Diagnosis  Date   Anxiety    Arthritis    "hands; spine too" (04/04/2012)   Cancer (HCC)    COPD (chronic obstructive pulmonary disease) (HCC)    Exertional dyspnea    Folate deficiency 10/17/2015   GERD (gastroesophageal reflux disease)    Hyperlipemia    Hypertension    Macrocytosis 10/17/2015   Neck pain, chronic    PAD (peripheral artery disease) (HCC)    Renal artery stenosis (HCC)    Renal disorder    Seasonal allergies    Tobacco abuse     ALLERGIES:  is allergic to  latex and codeine.  MEDICATIONS:  Current Outpatient Medications  Medication Sig Dispense Refill   potassium chloride SA (KLOR-CON) 20 MEQ tablet Take 1 tablet (20 mEq total) by mouth 2 (two) times daily. 14 tablet 0   acetaminophen (TYLENOL) 500 MG tablet Take 500 mg by mouth every 6 (six) hours as needed for moderate pain. (Patient not taking: Reported on 01/06/2021)     albuterol (VENTOLIN HFA) 108 (90 Base) MCG/ACT inhaler Inhale 1-2 puffs into the lungs every 6 (six) hours as needed for wheezing or shortness of breath.     amLODipine (NORVASC) 5 MG tablet Take 1 tablet (5 mg total) by mouth daily. 180 tablet 3   Budeson-Glycopyrrol-Formoterol (BREZTRI AEROSPHERE) 160-9-4.8 MCG/ACT AERO Inhale 2 puffs into the lungs in the morning and at bedtime. Take 2 puffs first thing in am and then another 2 puffs about 12 hours later. (Patient taking differently: Inhale 2 puffs into the lungs in the morning and at bedtime.) 10.7 g 11   clopidogrel (PLAVIX) 75 MG tablet Take 1 tablet (75 mg total) by mouth daily. PLEASE SCHEDULE APPOINTMENT FOR FURTHER REFILLS. 2ND ATTEMPT (Patient not taking: Reported on 01/06/2021) 15 tablet 0   cyclobenzaprine (FLEXERIL) 10 MG tablet Take 1 tablet (10 mg total) by mouth 3 (three) times daily as needed for muscle spasms. (Patient not taking: Reported on 01/06/2021) 30 tablet 1   dexlansoprazole (DEXILANT) 60 MG capsule Take 1 capsule (60 mg total) by mouth daily. 90 capsule 1   escitalopram (LEXAPRO) 10 MG tablet Take 10 mg by mouth daily.      folic acid (FOLVITE) 1 MG tablet Take 1 mg by mouth daily.     folic acid (FOLVITE) 1 MG tablet Take 1 tablet (1 mg total) by mouth daily. 30 tablet 4   metoprolol tartrate (LOPRESSOR) 25 MG tablet Take 1 tablet (25 mg total) by mouth 2 (two) times daily as needed (afib). 60 tablet 0   oxyCODONE-acetaminophen (PERCOCET/ROXICET) 5-325 MG tablet Take 1 tablet by mouth every 6 (six) hours as needed for severe pain. 30 tablet 0    prochlorperazine (COMPAZINE) 10 MG tablet Take 1 tablet (10 mg total) by mouth every 6 (six) hours as needed for nausea or vomiting. (Patient not taking: Reported on 01/06/2021) 30 tablet 3   prochlorperazine (COMPAZINE) 10 MG tablet Take 1 tablet (10 mg total) by mouth every 6 (six) hours as needed for nausea or vomiting. (Patient not taking: Reported on 01/06/2021) 30 tablet 0   rosuvastatin (CRESTOR) 20 MG tablet Take 20 mg by mouth daily.      vitamin B-12 (CYANOCOBALAMIN) 1000 MCG tablet Take 1 tablet (1,000 mcg total) by mouth daily. 60 tablet 2   No current facility-administered medications for this visit.    SURGICAL HISTORY:  Past Surgical History:  Procedure Laterality Date   ABDOMINAL ANGIOGRAM  04/04/2012   Procedure: ABDOMINAL ANGIOGRAM;  Surgeon:  Lorretta Harp, MD;  Location: [facility] CATH LAB;  Service: Cardiovascular;;   ANTERIOR CERVICAL DECOMP/DISCECTOMY FUSION  ~ 04/2006   C6, 7, T1   APPENDECTOMY     COLONOSCOPY N/A 02/26/2014   Adequate preparation. Minimal anal canal hemorrhoids;otherwise, normal rectumdiminutive polyps in the middescending segment; otherwise, the remainder of colonic mucosaappeared normal. Hyperplastic polyps.   ESOPHAGOGASTRODUODENOSCOPY N/A 02/26/2014   QMG:[id number] distal esophagus, query short segment Barrett's. Status post Venia Minks dilation with esophageal biopsy. Abnormal gastric mucosa. Esophageal biopsy negative for Barrett's. Gastric biopsy showed mild chronic gastritis, no H pylori.   INTERCOSTAL NERVE BLOCK Right 06/16/2020   Procedure: INTERCOSTAL NERVE BLOCK;  Surgeon: [name], MD;  Location: [place];  Service: Thoracic;  Laterality: Right;   LAPAROSCOPIC APPENDECTOMY N/A 09/10/2013   Procedure: APPENDECTOMY LAPAROSCOPIC;  Surgeon: Jamesetta So, MD;  Location: [facility];  Service: General;  Laterality: N/A;   LOWER EXTREMITY ANGIOGRAM N/A 04/04/2012   Procedure: LOWER EXTREMITY ANGIOGRAM;  Surgeon: Lorretta Harp, MD;  Location: [facility] CATH  LAB;  Service: Cardiovascular;  Laterality: N/A;   MALONEY DILATION N/A 02/26/2014   Procedure: Venia Minks DILATION;  Surgeon: Daneil Dolin, MD;  Location: [facility];  Service: Endoscopy;  Laterality: N/A;   NODE DISSECTION Right 06/16/2020   Procedure: NODE DISSECTION;  Surgeon: [name], MD;  Location: [place];  Service: Thoracic;  Laterality: Right;   PERIPHERAL ARTERIAL STENT GRAFT  04/04/2012   RENAL ANGIOGRAM Left 04/22/2014   Procedure: RENAL ANGIOGRAM;  Surgeon: Lorretta Harp, MD;  Location: [facility] CATH LAB;  Service: Cardiovascular;  Laterality: Left;   [place]   VASCULAR SURGERY  03/31/2016   Peripheral arterial stent   VIDEO BRONCHOSCOPY WITH ENDOBRONCHIAL NAVIGATION N/A 04/28/2020   Procedure: VIDEO BRONCHOSCOPY WITH ENDOBRONCHIAL NAVIGATION;  Surgeon: [name], MD;  Location: [place];  Service: Thoracic;  Laterality: N/A;   VIDEO BRONCHOSCOPY WITH ENDOBRONCHIAL NAVIGATION N/A 06/16/2020   Procedure: VIDEO BRONCHOSCOPY WITH ENDOBRONCHIAL NAVIGATION;  Surgeon: [name], MD;  Location: [place];  Service: Thoracic;  Laterality: N/A;    REVIEW OF SYSTEMS:   Review of Systems  Constitutional: Positive for fatigue, decreased appetite, and weight loss.  Negative for chills and fever.  HENT: Negative for mouth sores, nosebleeds, sore throat and trouble swallowing.   Eyes: Negative for eye problems and icterus.  Respiratory: Positive for dyspnea on exertion and chronic cough.  Negative for hemoptysis.   Cardiovascular: Negative for chest pain and leg swelling.  Gastrointestinal: Negative for abdominal pain, constipation, diarrhea, nausea and vomiting.  Genitourinary: Negative for bladder incontinence, difficulty urinating, dysuria, frequency and hematuria.   Musculoskeletal: Positive for chronic back pain. Negative for gait problem, neck pain and neck stiffness.  Skin: Negative for itching and rash.  Neurological: Positive for syncopal  episodes and lightheadedness on 7/12 and 7/15. Negative for dizziness, extremity weakness, gait problem, headaches, light-headedness and seizures.  Hematological: Negative for adenopathy. Does not bruise/bleed easily.  Psychiatric/Behavioral: Negative for confusion, depression and sleep disturbance. The patient is not nervous/anxious.     PHYSICAL EXAMINATION:  Blood pressure (!) 85/68, pulse 81, temperature (!) 97.5 F (36.4 C), temperature source Oral, resp. rate 18, weight 119 lb 8 oz (54.2 kg), SpO2 100 %.  ECOG PERFORMANCE STATUS: 2-3  Physical Exam  Constitutional: Oriented to person, place, and time and thin and chronically ill-appearing female, and in no distress.  HENT:  Head: Normocephalic and atraumatic.  Mouth/Throat: Oropharynx is clear and moist.  No oropharyngeal exudate.  Eyes: Conjunctivae are normal. Right eye exhibits no discharge. Left eye exhibits no discharge. No scleral icterus.  Neck: Normal range of motion. Neck supple.  Cardiovascular: Normal rate, regular rhythm, normal heart sounds and intact distal pulses.   Pulmonary/Chest: Effort normal.  Rhonchi noted bilaterally. No respiratory distress. No rales.  Abdominal: Soft. Bowel sounds are normal. Exhibits no distension and no mass. There is no tenderness.  Musculoskeletal: Normal range of motion. Exhibits no edema.  Lymphadenopathy:    No cervical adenopathy.  Neurological: Alert and oriented to person, place, and time. Exhibits muscle wasting.  Examined in the wheelchair.  Skin: Skin is warm and dry. No rash noted. Not diaphoretic. No erythema. No pallor.  Psychiatric: Mood, memory and judgment normal.  Vitals reviewed.  LABORATORY DATA: Lab Results  Component Value Date   WBC 4.8 02/02/2021   HGB 12.9 02/02/2021   HCT 36.9 02/02/2021   MCV 95.8 02/02/2021   PLT 231 02/02/2021      Chemistry      Component Value Date/Time   NA 134 (L) 02/02/2021 1102   K 2.9 (L) 02/02/2021 1102   CL 99 02/02/2021  1102   CO2 22 02/02/2021 1102   BUN 8 02/02/2021 1102   CREATININE 0.95 02/02/2021 1102   CREATININE 1.36 (H) 04/16/2014 1034      Component Value Date/Time   CALCIUM 9.0 02/02/2021 1102   ALKPHOS 283 (H) 02/02/2021 1102   AST 32 02/02/2021 1102   ALT 15 02/02/2021 1102   BILITOT 0.4 02/02/2021 1102       RADIOGRAPHIC STUDIES:  MM 3D SCREEN BREAST BILATERAL  Result Date: 01/28/2021 CLINICAL DATA:  Screening. EXAM: DIGITAL SCREENING BILATERAL MAMMOGRAM WITH TOMOSYNTHESIS AND CAD TECHNIQUE: Bilateral screening digital craniocaudal and mediolateral oblique mammograms were obtained. Bilateral screening digital breast tomosynthesis was performed. The images were evaluated with computer-aided detection. COMPARISON:  Previous exam(s). ACR Breast Density Category b: There are scattered areas of fibroglandular density. FINDINGS: There are no findings suspicious for malignancy. IMPRESSION: No mammographic evidence of malignancy. A result letter of this screening mammogram will be mailed directly to the patient. RECOMMENDATION: Screening mammogram in one year. (Code:SM-B-01Y) BI-RADS CATEGORY  1: Negative. Electronically Signed   By: Nolon Nations M.D.   On: 01/28/2021 12:54     ASSESSMENT/PLAN:  This is a very pleasant 60 year old Caucasian female who has metastatic non-small cell lung cancer, adenocarcinoma.  She initially was diagnosed as a stage IIIa (T4, N1, M0) adenocarcinoma in the right upper lobe.  She was diagnosed in November 2021.  She is positive for K-ras G 12 C mutation.  She is status post a right upper lobe segmentectomy in addition to right mediastinal lymph node dissection under the care of [name].  She was seen by Dr. Delton Coombes and she declined adjuvant systemic chemotherapy at that time.  The patient recently showed evidence of disease progression with progressive mediastinal and left hilar adenopathy in addition to enlarging left lower lobe lung mass and adjacent  left hilar adenopathy and a new 4.5 mm left upper lobe pulmonary nodule and suspicious new hepatic metastasis.  She is scheduled to have a PET scan on February 03, 2021.  Dr. Julien Nordmann started the patient on systemic chemotherapy with carboplatin for an AUC of 5, Alimta 500 mg per metered squared, and Keytruda 200 mg IV every 3 weeks.  She is status post 1 cycle.    The patient was seen with Dr. Julien Nordmann today. The patient is  reporting significant fatigue and weakness today. Her BP is low at 85/68. She took 12.5 mg of metoprolol last night. Her Norvasc is on hold. She reportedly only took folic acid and compazine today. We will arrange for her to receive 1 L of fluids while in the clinic. We will hold her treatment today and delay by 1 week until improvement in her general condition. Advised to talk to PCP about whether she needs to be on 12.5 mg of metoprolol or parameters to hold this. I recommended they monitor the patient's vitals at home as well. Her oxygen saturation is 100% on RA today. Her pulse is 81. Does not appear in acute distress.   The patient's husband and daughter had questions about her prognosis. Dr. Julien Nordmann answered their questions to their satisfaction. Dr. Julien Nordmann discussed that her condition is poor given the stage IV lung cancer and the goal of treatment is to try to make her live longer and better if possible.   We will reevaluate her next week and review her scan results before determining if she will proceed with cycle #2.   Her potassium is low today. I sent a prescription for KCl to her pharmacy to take one 20 mg tablet BID for 7 days.   If she should have a syncopal episode again, I advised her to seek emergency evaluation.   We performed a walking test today to see if she qualifies for supplemental oxygen. She did not qualify based on her results, her lowest oxygen saturation was 97%.   The patient was strongly encouraged to quit smoking. Discussed the health benefits for  multiple concerns she is having and how smoking is associated with poorer outcomes.   Regarding her weight loss, encouraged her to try to drink at least 3 ensures per day. If she does not like the consistency, advised she can make her own protein shakes or water them down with milk, water, soy milk, almond milk, etc.   The patient was advised to call immediately if she has any concerning symptoms in the interval. The patient voices understanding of current disease status and treatment options and is in agreement with the current care plan. All questions were answered. The patient knows to call the clinic with any problems, questions or concerns. We can certainly see the patient much sooner if necessary  No orders of the defined types were placed in this encounter.    Reiley Keisler L Novi Calia, PA-C 02/02/21  ADDENDUM: Hematology/Oncology Attending: I had a face-to-face encounter with the patient today.  I reviewed her lab, records and recommended her care plan.  This is a very pleasant 60 years old white female with metastatic non-small cell lung cancer, adenocarcinoma with positive K-ras G 12 C mutation.  The patient is currently undergoing systemic chemotherapy with carboplatin for AUC of 5, Alimta 500 Mg/M2 and Keytruda 200 Mg IV every 3 weeks status post 1 cycle.  She tolerated the first cycle well but she has increasing fatigue and weakness as well as lack of appetite and dehydration.  The patient does not feel very good today and she is concerned about proceeding with cycle #2 today as planned. I recommended for the patient to delay the start of cycle number 2 x 1 week until improvement of her condition. I will arrange for the patient to receive 1 L of normal saline in the clinic today for hydration.  She is scheduled to have a PET scan tomorrow to complete the staging work-up of her disease.  Will wait for the PET scan and will discuss results with her next week was also further  recommendation regarding her condition.  She may benefit from a reduced dose carboplatin and Alimta starting from cycle #2 but if the patient has an tolerability to this treatment, I may consider her for treatment with Lumakras (Sotorasib) because of the K-ras G12C mutation. The patient and her daughter agreed to the current plan. She was advised to call immediately if she has any concerning symptoms in the interval. The total time spent in the appointment was 35 minutes. Disclaimer: This note was dictated with voice recognition software. Similar sounding words can inadvertently be transcribed and may be missed upon review. Eilleen Kempf, MD 02/02/21

## 2021-01-30 NOTE — Telephone Encounter (Signed)
Connected with KARRYN KOSINSKI and spouse [name] regarding status of "FMLA form delivered morning of 01/21/2021.  Extension request granted through 02/06/2021.  What is the status?  Need to take care of my wife and keep job".      Notified of form return to [facility] yesterday via fax.  "Thank you.  They said they received something yesterday."

## 2021-02-02 ENCOUNTER — Inpatient Hospital Stay: Payer: Commercial Managed Care - PPO

## 2021-02-02 ENCOUNTER — Inpatient Hospital Stay (HOSPITAL_BASED_OUTPATIENT_CLINIC_OR_DEPARTMENT_OTHER): Payer: Commercial Managed Care - PPO | Admitting: Physician Assistant

## 2021-02-02 ENCOUNTER — Ambulatory Visit: Payer: Commercial Managed Care - PPO

## 2021-02-02 ENCOUNTER — Other Ambulatory Visit: Payer: Self-pay

## 2021-02-02 VITALS — BP 85/68 | HR 81 | Temp 97.5°F | Resp 18 | Wt 119.5 lb

## 2021-02-02 DIAGNOSIS — C3491 Malignant neoplasm of unspecified part of right bronchus or lung: Secondary | ICD-10-CM | POA: Diagnosis not present

## 2021-02-02 DIAGNOSIS — E876 Hypokalemia: Secondary | ICD-10-CM | POA: Diagnosis not present

## 2021-02-02 DIAGNOSIS — E611 Iron deficiency: Secondary | ICD-10-CM

## 2021-02-02 DIAGNOSIS — C342 Malignant neoplasm of middle lobe, bronchus or lung: Secondary | ICD-10-CM | POA: Diagnosis not present

## 2021-02-02 DIAGNOSIS — E538 Deficiency of other specified B group vitamins: Secondary | ICD-10-CM

## 2021-02-02 DIAGNOSIS — R5383 Other fatigue: Secondary | ICD-10-CM

## 2021-02-02 DIAGNOSIS — R634 Abnormal weight loss: Secondary | ICD-10-CM

## 2021-02-02 LAB — CMP (CANCER CENTER ONLY)
ALT: 15 U/L (ref 0–44)
AST: 32 U/L (ref 15–41)
Albumin: 2.7 g/dL — ABNORMAL LOW (ref 3.5–5.0)
Alkaline Phosphatase: 283 U/L — ABNORMAL HIGH (ref 38–126)
Anion gap: 13 (ref 5–15)
BUN: 8 mg/dL (ref 6–20)
CO2: 22 mmol/L (ref 22–32)
Calcium: 9 mg/dL (ref 8.9–10.3)
Chloride: 99 mmol/L (ref 98–111)
Creatinine: 0.95 mg/dL (ref 0.44–1.00)
GFR, Estimated: >60 mL/min (ref >60)
Glucose, Bld: 111 mg/dL — ABNORMAL HIGH (ref 70–99)
Potassium: 2.9 mmol/L — ABNORMAL LOW (ref 3.5–5.1)
Sodium: 134 mmol/L — ABNORMAL LOW (ref 135–145)
Total Bilirubin: 0.4 mg/dL (ref 0.3–1.2)
Total Protein: 6.7 g/dL (ref 6.5–8.1)

## 2021-02-02 LAB — CBC WITH DIFFERENTIAL (CANCER CENTER ONLY)
Abs Immature Granulocytes: 0.03 10*3/uL (ref 0.00–0.07)
Basophils Absolute: 0 10*3/uL (ref 0.0–0.1)
Basophils Relative: 1 %
Eosinophils Absolute: 0 10*3/uL (ref 0.0–0.5)
Eosinophils Relative: 1 %
HCT: 36.9 % (ref 36.0–46.0)
Hemoglobin: 12.9 g/dL (ref 12.0–15.0)
Immature Granulocytes: 1 %
Lymphocytes Relative: 24 %
Lymphs Abs: 1.1 10*3/uL (ref 0.7–4.0)
MCH: 33.5 pg (ref 26.0–34.0)
MCHC: 35 g/dL (ref 30.0–36.0)
MCV: 95.8 fL (ref 80.0–100.0)
Monocytes Absolute: 0.5 10*3/uL (ref 0.1–1.0)
Monocytes Relative: 10 %
Neutro Abs: 3.1 10*3/uL (ref 1.7–7.7)
Neutrophils Relative %: 64 %
Platelet Count: 231 10*3/uL (ref 150–400)
RBC: 3.85 MIL/uL — ABNORMAL LOW (ref 3.87–5.11)
RDW: 15 % (ref 11.5–15.5)
WBC Count: 4.8 10*3/uL (ref 4.0–10.5)
nRBC: 0.8 % — ABNORMAL HIGH (ref 0.0–0.2)

## 2021-02-02 MED ORDER — SODIUM CHLORIDE 0.9 % IV SOLN
Freq: Once | INTRAVENOUS | Status: AC
  Filled 2021-02-02: qty 250

## 2021-02-02 MED ORDER — CYANOCOBALAMIN 1000 MCG/ML IJ SOLN
INTRAMUSCULAR | Status: AC
  Filled 2021-02-02: qty 1

## 2021-02-02 MED ORDER — CYANOCOBALAMIN 1000 MCG/ML IJ SOLN
1000.0000 ug | Freq: Once | INTRAMUSCULAR | Status: AC
  Administered 2021-02-02: 1000 ug via INTRAMUSCULAR

## 2021-02-02 MED ORDER — POTASSIUM CHLORIDE CRYS ER 20 MEQ PO TBCR: 20.0000 meq | EXTENDED_RELEASE_TABLET | Freq: Two times a day (BID) | ORAL | 0 refills | Status: DC

## 2021-02-02 NOTE — Progress Notes (Signed)
Per Cassie Heilingoetter PA, no treatment today just IV fluids.

## 2021-02-02 NOTE — Patient Instructions (Signed)
Dehydration, Adult Dehydration is condition in which there is not enough water or other fluids in the body. This happens when a person loses more fluids than he or she takes in. Important body parts cannot work right without the right amount of fluids. Anyloss of fluids from the body can cause dehydration. Dehydration can be mild, worse, or very bad. It should be treated right away tokeep it from getting very bad. What are the causes? This condition may be caused by: Conditions that cause loss of water or other fluids, such as: Watery poop (diarrhea). Vomiting. Sweating a lot. Peeing (urinating) a lot. Not drinking enough fluids, especially when you: Are ill. Are doing things that take a lot of energy to do. Other illnesses and conditions, such as fever or infection. Certain medicines, such as medicines that take extra fluid out of the body (diuretics). Lack of safe drinking water. Not being able to get enough water and food. What increases the risk? The following factors may make you more likely to develop this condition: Having a long-term (chronic) illness that has not been treated the right way, such as: Diabetes. Heart disease. Kidney disease. Being 34 years of age or older. Having a disability. Living in a place that is high above the ground or sea (high in altitude). The thinner, dried air causes more fluid loss. Doing exercises that put stress on your body for a long time. What are the signs or symptoms? Symptoms of dehydration depend on how bad it is. Mild or worse dehydration Thirst. Dry lips or dry mouth. Feeling dizzy or light-headed, especially when you stand up from sitting. Muscle cramps. Your body making: Dark pee (urine). Pee may be the color of tea. Less pee than normal. Less tears than normal. Headache. Very bad dehydration Changes in skin. Skin may: Be cold to the touch (clammy). Be blotchy or pale. Not go back to normal right after you lightly pinch it  and let it go. Little or no tears, pee, or sweat. Changes in vital signs, such as: Fast breathing. Low blood pressure. Weak pulse. Pulse that is more than 100 beats a minute when you are sitting still. Other changes, such as: Feeling very thirsty. Eyes that look hollow (sunken). Cold hands and feet. Being mixed up (confused). Being very tired (lethargic) or having trouble waking from sleep. Short-term weight loss. Loss of consciousness. How is this treated? Treatment for this condition depends on how bad it is. Treatment should start right away. Do not wait until your condition gets very bad. Very bad dehydration is an emergency. You will need to go to a hospital. Mild or worse dehydration can be treated at home. You may be asked to: Drink more fluids. Drink an oral rehydration solution (ORS). This drink helps get the right amounts of fluids and salts and minerals in the blood (electrolytes). Very bad dehydration can be treated: With fluids through an IV tube. By getting normal levels of salts and minerals in your blood. This is often done by giving salts and minerals through a tube. The tube is passed through your nose and into your stomach. By treating the root cause. Follow these instructions at home: Oral rehydration solution If told by your doctor, drink an ORS: Make an ORS. Use instructions on the package. Start by drinking small amounts, about  cup (120 mL) every 5-10 minutes. Slowly drink more until you have had the amount that your doctor said to have. Eating and drinking  Drink enough clear fluid to keep your pee pale yellow. If you were told to drink an ORS, finish the ORS first. Then, start slowly drinking other clear fluids. Drink fluids such as: Water. Do not drink only water. Doing that can make the salt (sodium) level in your body get too low. Water from ice chips you suck on. Fruit juice that you have added water to (diluted). Low-calorie sports  drinks. Eat foods that have the right amounts of salts and minerals, such as: Bananas. Oranges. Potatoes. Tomatoes. Spinach. Do not drink alcohol. Avoid: Drinks that have a lot of sugar. These include: High-calorie sports drinks. Fruit juice that you did not add water to. Soda. Caffeine. Foods that are greasy or have a lot of fat or sugar. General instructions Take over-the-counter and prescription medicines only as told by your doctor. Do not take salt tablets. Doing that can make the salt level in your body get too high. Return to your normal activities as told by your doctor. Ask your doctor what activities are safe for you. Keep all follow-up visits as told by your doctor. This is important. Contact a doctor if: You have pain in your belly (abdomen) and the pain: Gets worse. Stays in one place. You have a rash. You have a stiff neck. You get angry or annoyed (irritable) more easily than normal. You are more tired or have a harder time waking than normal. You feel: Weak or dizzy. Very thirsty. Get help right away if you have: Any symptoms of very bad dehydration. Symptoms of vomiting, such as: You cannot eat or drink without vomiting. Your vomiting gets worse or does not go away. Your vomit has blood or green stuff in it. Symptoms that get worse with treatment. A fever. A very bad headache. Problems with peeing or pooping (having a bowel movement), such as: Watery poop that gets worse or does not go away. Blood in your poop (stool). This may cause poop to look black and tarry. Not peeing in 6-8 hours. Peeing only a small amount of very dark pee in 6-8 hours. Trouble breathing. These symptoms may be an emergency. Do not wait to see if the symptoms will go away. Get medical help right away. Call your local emergency services (911 in the [place] Do not drive yourself to the hospital. Summary Dehydration is a condition in which there is not enough water or other fluids  in the body. This happens when a person loses more fluids than he or she takes in. Treatment for this condition depends on how bad it is. Treatment should be started right away. Do not wait until your condition gets very bad. Drink enough clear fluid to keep your pee pale yellow. If you were told to drink an oral rehydration solution (ORS), finish the ORS first. Then, start slowly drinking other clear fluids. Take over-the-counter and prescription medicines only as told by your doctor. Get help right away if you have any symptoms of very bad dehydration. This information is not intended to replace advice given to you by your health care provider. Make sure you discuss any questions you have with your healthcare provider.  Vitamin B12 Injection What is this medication? Vitamin B12 (VAHY tuh min B12) prevents and treats low vitamin B12 levels in your body. It is used in people who do not get enough vitamin B12 from their diet or when their digestive tract does not absorb enough. Vitamin B12 plays an important role in maintaining the health of your  nervous system and red bloodcells. This medicine may be used for other purposes; ask your health care provider orpharmacist if you have questions. COMMON BRAND NAME(S): B-12 Compliance Kit, B-12 Injection Kit, Cyomin, LA-12,Nutri-Twelve, Physicians EZ Use B-12, Primabalt What should I tell my care team before I take this medication? They need to know if you have any of these conditions: Kidney disease Leber's disease Megaloblastic anemia An unusual or allergic reaction to cyanocobalamin, cobalt, other medications, foods, dyes, or preservatives Pregnant or trying to get pregnant Breast-feeding How should I use this medication? This medication is injected into a muscle or deeply under the skin. It is usually given in a clinic or care team's office. However, your care team [place] Sheets you how to inject yourself. Follow all instructions. Talk to your care team  about the use of this medication in children. Specialcare may be needed. Overdosage: If you think you have taken too much of this medicine contact apoison control center or emergency room at once. NOTE: This medicine is only for you. Do not share this medicine with others. What if I miss a dose? If you are given your dose at a clinic or care team's office, call to reschedule your appointment. If you give your own injections, and you miss a dose, take it as soon as you can. If it is almost time for your next dose, takeonly that dose. Do not take double or extra doses. What may interact with this medication? Colchicine Heavy alcohol intake This list may not describe all possible interactions. Give your health care provider a list of all the medicines, herbs, non-prescription drugs, or dietary supplements you use. Also tell them if you smoke, drink alcohol, or use illegaldrugs. Some items may interact with your medicine. What should I watch for while using this medication? Visit your care team regularly. You may need blood work done while you aretaking this medication. You may need to follow a special diet. Talk to your care team. Limit youralcohol intake and avoid smoking to get the best benefit. What side effects may I notice from receiving this medication? Side effects that you should report to your care team as soon as possible: Allergic reactions-skin rash, itching, hives, swelling of the face, lips, tongue, or throat Swelling of the ankles, hands, or feet Trouble breathing Side effects that usually do not require medical attention (report to your careteam if they continue or are bothersome): Diarrhea This list may not describe all possible side effects. Call your doctor for medical advice about side effects. You may report side effects to FDA at1-800-FDA-1088. Where should I keep my medication? Keep out of the reach of children. Store at room temperature between 15 and 30 degrees C (59 and  85 degrees F).Protect from light. Throw away any unused medication after the expiration date. NOTE: This sheet is a summary. It may not cover all possible information. If you have questions about this medicine, talk to your doctor, pharmacist, orhealth care provider.  2022 [employer]vier/Gold Standard (2020-08-25 11:47:06)  Document Revised: 02/15/2019 Document Reviewed: 02/15/2019 Else[employer] Patient Education  Person.

## 2021-02-03 ENCOUNTER — Ambulatory Visit
Admission: RE | Admit: 2021-02-03 | Discharge: 2021-02-03 | Disposition: A | Discharge status: Home / Self Care | Payer: Commercial Managed Care - PPO | Source: Ambulatory Visit | Attending: Internal Medicine | Admitting: Internal Medicine

## 2021-02-03 DIAGNOSIS — C3490 Malignant neoplasm of unspecified part of unspecified bronchus or lung: Secondary | ICD-10-CM

## 2021-02-03 DIAGNOSIS — C787 Secondary malignant neoplasm of liver and intrahepatic bile duct: Secondary | ICD-10-CM | POA: Insufficient documentation

## 2021-02-03 DIAGNOSIS — I700 Atherosclerosis of aorta: Secondary | ICD-10-CM | POA: Insufficient documentation

## 2021-02-03 DIAGNOSIS — D3501 Benign neoplasm of right adrenal gland: Secondary | ICD-10-CM | POA: Diagnosis not present

## 2021-02-03 DIAGNOSIS — C7951 Secondary malignant neoplasm of bone: Secondary | ICD-10-CM | POA: Insufficient documentation

## 2021-02-03 DIAGNOSIS — Z79899 Other long term (current) drug therapy: Secondary | ICD-10-CM | POA: Insufficient documentation

## 2021-02-03 DIAGNOSIS — C786 Secondary malignant neoplasm of retroperitoneum and peritoneum: Secondary | ICD-10-CM | POA: Diagnosis not present

## 2021-02-03 DIAGNOSIS — I2510 Atherosclerotic heart disease of native coronary artery without angina pectoris: Secondary | ICD-10-CM | POA: Diagnosis not present

## 2021-02-03 DIAGNOSIS — C7800 Secondary malignant neoplasm of unspecified lung: Secondary | ICD-10-CM | POA: Diagnosis not present

## 2021-02-03 DIAGNOSIS — C3491 Malignant neoplasm of unspecified part of right bronchus or lung: Secondary | ICD-10-CM | POA: Diagnosis not present

## 2021-02-03 LAB — GLUCOSE, CAPILLARY: Glucose-Capillary: 94 mg/dL (ref 70–99)

## 2021-02-03 MED ORDER — FLUDEOXYGLUCOSE F - 18 (FDG) INJECTION
6.4000 | Freq: Once | INTRAVENOUS | Status: AC | PRN
  Administered 2021-02-03: 6.76 via INTRAVENOUS

## 2021-02-04 ENCOUNTER — Telehealth: Payer: Self-pay | Admitting: Physician Assistant

## 2021-02-04 NOTE — Telephone Encounter (Signed)
Scheduled per los. Called and spoke with patients daughter, Andee Poles. Confimed appt

## 2021-02-09 ENCOUNTER — Other Ambulatory Visit: Payer: Self-pay | Admitting: Internal Medicine

## 2021-02-09 NOTE — Progress Notes (Signed)
[facility] OFFICE PROGRESS NOTE  Celene Squibb, MD [address] [place] [address]  DIAGNOSIS: Metastatic non-small cell lung cancer, adenocarcinoma that was initially diagnosed as stage IIIA (T4, N1, M0) non-small cell lung cancer, adenocarcinoma in November 2021 status post right upper lobe segmentectomy in addition to wedge resection of the right middle lobe with lymph node dissection. In July 2022, she had metastatic disease with widespread bone metastases, several liver metastases, left infrahilar lung metastasis, recurrent subcarinal and right paratracheal nodal metastases, and anterior right peritoneal metastases, and suspicious findings for lymphangitic tumor.    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 She declined adjuvant systemic chemotherapy. 2) Systemic chemotherapy with carboplatin for AUC of 5, Alimta 500 Mg/M2 and Keytruda 200 Mg IV every 3 weeks.  First dose January 13, 2021. Status post 1 cycle. Discontinued after 1 cycle.   CURRENT THERAPY: None   INTERVAL HISTORY: Connie Farrell 60 y.o. female returns to clinic today for a follow-up visit accompanied by her husband. The patient was last seen last week on 02/02/2021 for evaluation before starting cycle #2 of treatment.  At the patient's last appointment, she was endorsing multiple syncopal episodes, lightheadedness, shortness of breath, fatigue, decreased appetite, and weight loss.  Her treatment was delayed by 1 week.  She did not qualify for supplemental oxygen.  She received 1 L of fluid instead.  She also was instructed to follow-up with her PCP about adjustments of her blood pressure medications which have since been discontinued.   Since her last appointment, the patient denies any more syncopal episodes but she continues to feel  unwell. Her and her husband are contemplating quality vs quantity of life.  She continues to report fatigue and weakness. She spends most of the day in bed.  She denies any fever or night sweats.  She denies any headache or visual changes.  She continues to report dyspnea on exertion and a cough which produces phlegm.  She believes her breathing may be even more labored and notes some increased chest congestion. She also notes some chest tightness which she associates with anxiety. She has some worsening left sided thoracic pain. She denies any hemoptysis.  She has multiple inhalers and nebulizers which she is compliant with. She notes some nausea daily after taking her medications and vomiting about 3-4x per week. He has some scalp tenderness but recently hit her head. She also notes some visual blurring. She had her baseline brain MRI performed 1 month ago which was negative for clear evidence of metastatic disease except for  a punctate focus of enhancement in the in the inferolateral left parietal lobe which may represent a small cortical vessel, but warrants short interval follow-up MRI with contrast in approximately 4 weeks to ensure stability and exclude metastatic disease. She denies any rashes or skin changes.  She is on oxycodone and Flexeril prescribed by her PCP for her chronic back pain which she states controls her pain.  Of note, the patient recently had a restaging PET scan performed which showed multiple new findings including widespread bone metastases.  The patient is here today for evaluation, to review her PET scan results, and for consideration before starting cycle #2 treatment.   MEDICAL HISTORY: Past Medical History:  Diagnosis Date   Anxiety  Arthritis    "hands; spine too" (04/04/2012)   Cancer ([place])    COPD (chronic obstructive pulmonary disease) (HCC)    Exertional dyspnea    Folate deficiency 10/17/2015   GERD (gastroesophageal reflux disease)    Hyperlipemia     Hypertension    Macrocytosis 10/17/2015   Neck pain, chronic    PAD (peripheral artery disease) (HCC)    Renal artery stenosis (HCC)    Renal disorder    Seasonal allergies    Tobacco abuse     ALLERGIES:  is allergic to latex and codeine.  MEDICATIONS:  Current Outpatient Medications  Medication Sig Dispense Refill   albuterol (VENTOLIN HFA) 108 (90 Base) MCG/ACT inhaler Inhale 1-2 puffs into the lungs every 6 (six) hours as needed for wheezing or shortness of breath.     Budeson-Glycopyrrol-Formoterol (BREZTRI AEROSPHERE) 160-9-4.8 MCG/ACT AERO Inhale 2 puffs into the lungs in the morning and at bedtime. Take 2 puffs first thing in am and then another 2 puffs about 12 hours later. (Patient taking differently: Inhale 2 puffs into the lungs in the morning and at bedtime.) 10.7 g 11   clopidogrel (PLAVIX) 75 MG tablet Take 1 tablet (75 mg total) by mouth daily. PLEASE SCHEDULE APPOINTMENT FOR FURTHER REFILLS. 2ND ATTEMPT 15 tablet 0   cyclobenzaprine (FLEXERIL) 10 MG tablet Take 1 tablet (10 mg total) by mouth 3 (three) times daily as needed for muscle spasms. 30 tablet 1   dexlansoprazole (DEXILANT) 60 MG capsule Take 1 capsule (60 mg total) by mouth daily. 90 capsule 1   escitalopram (LEXAPRO) 10 MG tablet Take 10 mg by mouth daily.      folic acid (FOLVITE) 1 MG tablet Take 1 tablet (1 mg total) by mouth daily. 30 tablet 4   metoprolol tartrate (LOPRESSOR) 25 MG tablet Take 1 tablet (25 mg total) by mouth 2 (two) times daily as needed (afib). 60 tablet 0   oxyCODONE-acetaminophen (PERCOCET/ROXICET) 5-325 MG tablet TAKE 1 TABLET BY MOUTHEVERY 6 HOURS. 30 tablet 0   potassium chloride SA (KLOR-CON) 20 MEQ tablet Take 1 tablet (20 mEq total) by mouth 2 (two) times daily. 14 tablet 0   prochlorperazine (COMPAZINE) 10 MG tablet Take 1 tablet (10 mg total) by mouth every 6 (six) hours as needed for nausea or vomiting. 30 tablet 3   prochlorperazine (COMPAZINE) 10 MG tablet Take 1 tablet (10 mg  total) by mouth every 6 (six) hours as needed for nausea or vomiting. 30 tablet 0   rosuvastatin (CRESTOR) 20 MG tablet Take 20 mg by mouth daily.      vitamin B-12 (CYANOCOBALAMIN) 1000 MCG tablet Take 1 tablet (1,000 mcg total) by mouth daily. 60 tablet 2   amLODipine (NORVASC) 5 MG tablet Take 1 tablet (5 mg total) by mouth daily. 180 tablet 3   No current facility-administered medications for this visit.    SURGICAL HISTORY:  Past Surgical History:  Procedure Laterality Date   ABDOMINAL ANGIOGRAM  04/04/2012   Procedure: ABDOMINAL ANGIOGRAM;  Surgeon: Lorretta Harp, MD;  Location: [facility] CATH LAB;  Service: Cardiovascular;;   ANTERIOR CERVICAL DECOMP/DISCECTOMY FUSION  ~ 04/2006   C6, 7, T1   APPENDECTOMY     COLONOSCOPY N/A 02/26/2014   Adequate preparation. Minimal anal canal hemorrhoids;otherwise, normal rectumdiminutive polyps in the middescending segment; otherwise, the remainder of colonic mucosaappeared normal. Hyperplastic polyps.   ESOPHAGOGASTRODUODENOSCOPY N/A 02/26/2014   MWN:[id number] distal esophagus, query short segment Barrett's. Status post Venia Minks dilation with esophageal biopsy. Abnormal gastric  mucosa. Esophageal biopsy negative for Barrett's. Gastric biopsy showed mild chronic gastritis, no H pylori.   INTERCOSTAL NERVE BLOCK Right 06/16/2020   Procedure: INTERCOSTAL NERVE BLOCK;  Surgeon: [name], MD;  Location: [place];  Service: Thoracic;  Laterality: Right;   LAPAROSCOPIC APPENDECTOMY N/A 09/10/2013   Procedure: APPENDECTOMY LAPAROSCOPIC;  Surgeon: Jamesetta So, MD;  Location: [facility];  Service: General;  Laterality: N/A;   LOWER EXTREMITY ANGIOGRAM N/A 04/04/2012   Procedure: LOWER EXTREMITY ANGIOGRAM;  Surgeon: Lorretta Harp, MD;  Location: [facility] CATH LAB;  Service: Cardiovascular;  Laterality: N/A;   MALONEY DILATION N/A 02/26/2014   Procedure: Venia Minks DILATION;  Surgeon: Daneil Dolin, MD;  Location: [facility] ENDO SU[facility];  Service: Endoscopy;  Laterality:  N/A;   NODE DISSECTION Right 06/16/2020   Procedure: NODE DISSECTION;  Surgeon: [name], MD;  Location: [place];  Service: Thoracic;  Laterality: Right;   PERIPHERAL ARTERIAL STENT GRAFT  04/04/2012   RENAL ANGIOGRAM Left 04/22/2014   Procedure: RENAL ANGIOGRAM;  Surgeon: Lorretta Harp, MD;  Location: [facility] CATH LAB;  Service: Cardiovascular;  Laterality: Left;   [place]   VASCULAR SURGERY  03/31/2016   Peripheral arterial stent   VIDEO BRONCHOSCOPY WITH ENDOBRONCHIAL NAVIGATION N/A 04/28/2020   Procedure: VIDEO BRONCHOSCOPY WITH ENDOBRONCHIAL NAVIGATION;  Surgeon: [name], MD;  Location: [place];  Service: Thoracic;  Laterality: N/A;   VIDEO BRONCHOSCOPY WITH ENDOBRONCHIAL NAVIGATION N/A 06/16/2020   Procedure: VIDEO BRONCHOSCOPY WITH ENDOBRONCHIAL NAVIGATION;  Surgeon: [name], MD;  Location: [place];  Service: Thoracic;  Laterality: N/A;    REVIEW OF SYSTEMS:   Constitutional: Positive for fatigue, decreased appetite, and weight loss.  Negative for chills and fever.  HENT: Negative for mouth sores, nosebleeds, sore throat and trouble swallowing.   Eyes: Negative for eye problems and icterus. Respiratory: Positive for dyspnea on exertion and chronic cough.  Negative for hemoptysis.   Cardiovascular: Positive for left lateral rib pain. Negative for  leg swelling. Gastrointestinal: Positive for nausea/vomiting. Negative for abdominal pain, constipation, and diarrhea. Genitourinary: Negative for bladder incontinence, difficulty urinating, dysuria, frequency and hematuria.   Musculoskeletal: Positive for chronic back pain. Positive for left rib pain.  Negative for gait problem, neck pain and neck stiffness. Skin: Negative for itching and rash. Neurological: Negative for dizziness, extremity weakness, gait problem, headaches, light-headedness and seizures. Hematological: Negative for adenopathy. Does not bruise/bleed  easily. Psychiatric/Behavioral: Negative for confusion, depression and sleep disturbance. The patient is not nervous/anxious.      PHYSICAL EXAMINATION:  Blood pressure 105/76, pulse 85, temperature 98.2 F (36.8 C), temperature source Oral, resp. rate 17, height _0  (1.626 m), weight 119 lb 9.6 oz (54.3 kg), SpO2 99 %.  ECOG PERFORMANCE STATUS: 3  Physical Exam  Constitutional: Oriented to person, place, and time and thin and chronically ill-appearing female, and in no distress. HENT: Head: Normocephalic and atraumatic. Mouth/Throat: Oropharynx is clear and moist. No oropharyngeal exudate. Eyes: Conjunctivae are normal. Right eye exhibits no discharge. Left eye exhibits no discharge. No scleral icterus. Neck: Normal range of motion. Neck supple. Cardiovascular: Normal rate, regular rhythm, normal heart sounds and intact distal pulses.   Pulmonary/Chest: Effort normal.  Rhonchi noted bilaterally. No respiratory distress. No rales. Abdominal: Soft. Bowel sounds are normal. Exhibits no distension and no mass. There is no tenderness.  Musculoskeletal: Normal range of motion. Exhibits no edema.  Lymphadenopathy:    No cervical adenopathy.  Neurological: Alert and oriented to  person, place, and time. Exhibits muscle wasting.  Examined in the wheelchair. Skin: Skin is warm and dry. No rash noted. Not diaphoretic. No erythema. No pallor.  Psychiatric: Mood, memory and judgment normal. Vitals reviewed.  LABORATORY DATA: Lab Results  Component Value Date   WBC 6.2 02/10/2021   HGB 12.4 02/10/2021   HCT 36.8 02/10/2021   MCV 99.7 02/10/2021   PLT 190 02/10/2021      Chemistry      Component Value Date/Time   NA 134 (L) 02/10/2021 1054   K 4.8 02/10/2021 1054   CL 105 02/10/2021 1054   CO2 19 (L) 02/10/2021 1054   BUN 8 02/10/2021 1054   CREATININE 0.87 02/10/2021 1054   CREATININE 1.36 (H) 04/16/2014 1034      Component Value Date/Time   CALCIUM 9.7 02/10/2021 1054    ALKPHOS 249 (H) 02/10/2021 1054   AST 19 02/10/2021 1054   ALT 9 02/10/2021 1054   BILITOT 0.5 02/10/2021 1054       RADIOGRAPHIC STUDIES:  NM PET Image Restage (PS) Skull Base to Thigh  Result Date: 02/03/2021 CLINICAL DATA:  Subsequent treatment strategy for non-small cell right lung cancer. EXAM: NUCLEAR MEDICINE PET SKULL BASE TO THIGH TECHNIQUE: 6.8 mCi F-18 FDG was injected intravenously. Full-ring PET imaging was performed from the skull base to thigh after the radiotracer. CT data was obtained and used for attenuation correction and anatomic localization. Fasting blood glucose: 94 mg/dl COMPARISON:  01/02/2021 chest CT.  04/14/2020 PET-CT. FINDINGS: Mediastinal blood pool activity: SUV max 2.3 Liver activity: SUV max NA NECK: No hypermetabolic lymph nodes in the neck. Incidental CT findings: none CHEST: Hypermetabolic lower left perihilar 3.6 x 2.7 cm mass with max SUV 9.9 (series 3/image 91), new from prior PET-CT. Mildly enlarged hypermetabolic 1.0 cm subcarinal node with max SUV 6.3 (series 3/image 85), previous max SUV 4.7 on 04/14/2020 PET-CT, mildly increased. Hypermetabolic high right paratracheal 0.9 cm node with max SUV 3.8 (series 3/image 61), previous max SUV 2.8, mildly increased. No enlarged or hypermetabolic axillary or right hilar nodes. No additional hypermetabolic pulmonary findings. Scattered fine peribronchovascular nodularity throughout both lungs appears generally new since 01/02/2021 chest CT, cannot exclude lymphangitic carcinomatosis, below PET resolution. Incidental CT findings: Coronary atherosclerosis. Atherosclerotic nonaneurysmal thoracic aorta. Trace dependent left pleural effusion. ABDOMEN/PELVIS: Multiple (at least 4) new hypermetabolic liver masses scattered throughout the liver. Representative 3.1 cm segment 7 right liver mass with max SUV 9.3 (series 3/image 121). Representative medial segment 7 right liver lobe 2.1 cm mass with max SUV 9.2 (series 3/image 123).  Representative inferior right liver 1.3 cm mass with max SUV 5.2 (series 3/image 142). New hypermetabolic 1.1 cm anterior peritoneal soft tissue nodule just to the right of midline near the distal stomach with max SUV 3.3 (series 3/image 158). No abnormal hypermetabolic activity within the pancreas, adrenal glands, or spleen. No hypermetabolic lymph nodes in the abdomen or pelvis. Incidental CT findings: Stable non hypermetabolic 1.6 cm right adrenal nodule compatible with an adenoma. Atherosclerotic nonaneurysmal abdominal aorta. SKELETON: Widespread new hypermetabolic faintly sclerotic osseous lesions throughout spine, bilateral pelvic girdle and bilateral ribs. Representative bone lesions as follows: -medial left iliac bone 1.8 cm lesion with max SUV 7.6 (series 3/image 194) -lateral right femoral head 1.3 cm lesion with max SUV 4.5 (series 3/image 223) -T12 vertebral and posterior element lesion with max SUV 7.4 -posteromedial right sixth rib mixed lytic and sclerotic lesion with max SUV 6.6 Incidental CT findings: Postsurgical changes from ACDF.  IMPRESSION: 1. Widespread hypermetabolic sclerotic bone metastases, new from 04/14/2020 PET-CT. 2. Several new hypermetabolic liver metastases. 3. New hypermetabolic left infrahilar lung metastasis. Recurrent subcarinal and right paratracheal nodal metastases. 4. New hypermetabolic anterior right peritoneal metastasis near the distal stomach. 5. New fine peribronchovascular nodularity in both lungs, suspicious for lymphangitic tumor. Trace dependent left pleural effusion. 6. Chronic findings include: Aortic Atherosclerosis (ICD10-I70.0). Stable right adrenal adenoma. Coronary atherosclerosis. Electronically Signed   By: Ilona Sorrel M.D.   On: 02/03/2021 14:53   MM 3D SCREEN BREAST BILATERAL  Result Date: 01/28/2021 CLINICAL DATA:  Screening. EXAM: DIGITAL SCREENING BILATERAL MAMMOGRAM WITH TOMOSYNTHESIS AND CAD TECHNIQUE: Bilateral screening digital craniocaudal  and mediolateral oblique mammograms were obtained. Bilateral screening digital breast tomosynthesis was performed. The images were evaluated with computer-aided detection. COMPARISON:  Previous exam(s). ACR Breast Density Category b: There are scattered areas of fibroglandular density. FINDINGS: There are no findings suspicious for malignancy. IMPRESSION: No mammographic evidence of malignancy. A result letter of this screening mammogram will be mailed directly to the patient. RECOMMENDATION: Screening mammogram in one year. (Code:[id number]-[id number]) BI-RADS CATEGORY  1: Negative. Electronically Signed   By: Nolon Nations M.D.   On: 01/28/2021 12:54     ASSESSMENT/PLAN:  This is a very pleasant 60 year old Caucasian female who has metastatic non-small cell lung cancer, adenocarcinoma.  She initially was diagnosed as a stage IIIa (T4, N1, M0) adenocarcinoma in the right upper lobe.  She was diagnosed in November 2021.  She is positive for K-ras G 12 C mutation. In July 2022, she had metastatic disease with widespread bone metastases, several liver metastases, left infrahilar lung metastasis, recurrent subcarinal and right paratracheal nodal metastases, and anterior right peritoneal metastases, and suspicious findings for lymphangitic tumor.   She is status post a right upper lobe segmentectomy in addition to right mediastinal lymph node dissection under the care of [name].  She was seen by Dr. Delton Coombes and she declined adjuvant systemic chemotherapy at that time.  The patient recently showed evidence of disease progression with progressive mediastinal and left hilar adenopathy in addition to enlarging left lower lobe lung mass and adjacent left hilar adenopathy and a new 4.5 mm left upper lobe pulmonary nodule and suspicious new hepatic metastasis in May 2022.  Dr. Julien Nordmann started the patient on systemic chemotherapy with carboplatin for an AUC of 5, Alimta 500 mg per metered squared, and Keytruda  200 mg IV every 3 weeks.  She is status post 1 cycle. She has a poor performance status and it further exacerbated her fatigue and weakness.    The patient was seen with Dr. Julien Nordmann. Mohamed personally and independently reviewed the patient's restaging PET scan and discussed results with the patient today.  The PET scan showed widespread disease.  The patient and her husband had several questions today about her prognosis and options. Dr. Julien Nordmann discussed the life expectancy with and without treatment. He discussed life expectancy without treatment may be 3 months or less vs with treatment may be 1-1.5 years. Dr. Julien Nordmann gave the patient the option of hospice care which is reasonable given her poor performance status and poor prognosis.  Dr. Julien Nordmann also discussed continuing on systemic chemotherapy, but reducing the dose to make it more tolerable.  The third option would be to try targeted oral treatment with Lumakras for her KRASG12C mutation which is typically given in the second line setting but would be reasonable given her poor tolerance to chemotherapy.   The patient opted to proceed  with hospice care.  I will place a referral to hospice care.  We will cancel the patient's appointments but would be happy to see her on an as-needed basis.  The patient was advised to call immediately if she has any concerning symptoms in the interval. The patient voices understanding of current disease status and treatment options and is in agreement with the current care plan. All questions were answered. The patient knows to call the clinic with any problems, questions or concerns. We can certainly see the patient much sooner if necessary    Orders Placed This Encounter  Procedures   Ambulatory referral to Hospice    Referral Priority:   Routine    Referral Type:   Consultation    Referral Reason:   Specialty Services Required    Requested Specialty:   Hospice Services    Number of Visits Requested:    [place], PA-C 02/10/21  ADDENDUM: Hematology/Oncology Attending: I had a face-to-face encounter with the patient today.  I reviewed her lab, record, scan and recommended her care plan.  This is a very pleasant 60 years old white female with stage IV non-small cell lung cancer, adenocarcinoma with positive KRAS G12C mutation that was initially diagnosed as a stage IIIA (T4, N1, M0) adenocarcinoma in November 2021 status post right upper lobe segmentectomy in addition to right mediastinal lymph node dissection under the care of [name].  The patient declined adjuvant systemic chemotherapy.  She has evidence for disease recurrence in May 2022 presented with progressive mediastinal and left hilar adenopathy in addition to enlarging left lower lobe lung mass and adjacent left hilar adenopathy and liver metastasis. She started systemic chemotherapy with carboplatin, Alimta and Keytruda status post 1 cycle.  The patient has a rough time tolerating this treatment with significant fatigue and weakness as well as lack of appetite and weight loss. Repeat PET scan recently showed widely extensive disease involving the lung, liver and bone. I had a lengthy discussion with the patient and her husband today about her current condition and treatment options.  I gave the patient the option of continuing her treatment with carboplatin, Alimta and Keytruda with reduced dose of the chemotherapy versus treatment with Lumakras (Sotorasib) as a target therapy for the K-ras G12C mutation versus palliative care and hospice referral. The patient and her husband are not interested in any further treatment and they would like to proceed with hospice care at this point. Will arrange for the patient to have hospice service in her county and will continue to support her care. She was advised to call immediately if she has any other concerning symptoms in the interval. The total time spent in the  appointment was 34 minutes.  Disclaimer: This note was dictated with voice recognition software. Similar sounding words can inadvertently be transcribed and may be missed upon review. Eilleen Kempf, MD 02/10/21

## 2021-02-10 ENCOUNTER — Encounter (HOSPITAL_COMMUNITY): Payer: Self-pay | Admitting: Hematology

## 2021-02-10 ENCOUNTER — Encounter: Payer: Self-pay | Admitting: Internal Medicine

## 2021-02-10 ENCOUNTER — Inpatient Hospital Stay: Payer: Commercial Managed Care - PPO

## 2021-02-10 ENCOUNTER — Other Ambulatory Visit: Payer: Self-pay | Admitting: Internal Medicine

## 2021-02-10 ENCOUNTER — Other Ambulatory Visit: Payer: Commercial Managed Care - PPO

## 2021-02-10 ENCOUNTER — Other Ambulatory Visit: Payer: Self-pay

## 2021-02-10 ENCOUNTER — Inpatient Hospital Stay: Payer: Commercial Managed Care - PPO | Admitting: Physician Assistant

## 2021-02-10 ENCOUNTER — Telehealth: Payer: Self-pay | Admitting: *Deleted

## 2021-02-10 VITALS — BP 105/76 | HR 85 | Temp 98.2°F | Resp 17 | Ht 64.0 in | Wt 119.6 lb

## 2021-02-10 DIAGNOSIS — C342 Malignant neoplasm of middle lobe, bronchus or lung: Secondary | ICD-10-CM | POA: Diagnosis not present

## 2021-02-10 DIAGNOSIS — C3491 Malignant neoplasm of unspecified part of right bronchus or lung: Secondary | ICD-10-CM

## 2021-02-10 DIAGNOSIS — Z7189 Other specified counseling: Secondary | ICD-10-CM | POA: Insufficient documentation

## 2021-02-10 LAB — CBC WITH DIFFERENTIAL (CANCER CENTER ONLY)
Abs Immature Granulocytes: 0.03 10*3/uL (ref 0.00–0.07)
Basophils Absolute: 0.1 10*3/uL (ref 0.0–0.1)
Basophils Relative: 1 %
Eosinophils Absolute: 0 10*3/uL (ref 0.0–0.5)
Eosinophils Relative: 1 %
HCT: 36.8 % (ref 36.0–46.0)
Hemoglobin: 12.4 g/dL (ref 12.0–15.0)
Immature Granulocytes: 1 %
Lymphocytes Relative: 16 %
Lymphs Abs: 1 10*3/uL (ref 0.7–4.0)
MCH: 33.6 pg (ref 26.0–34.0)
MCHC: 33.7 g/dL (ref 30.0–36.0)
MCV: 99.7 fL (ref 80.0–100.0)
Monocytes Absolute: 0.5 10*3/uL (ref 0.1–1.0)
Monocytes Relative: 8 %
Neutro Abs: 4.6 10*3/uL (ref 1.7–7.7)
Neutrophils Relative %: 73 %
Platelet Count: 190 10*3/uL (ref 150–400)
RBC: 3.69 MIL/uL — ABNORMAL LOW (ref 3.87–5.11)
RDW: 16.4 % — ABNORMAL HIGH (ref 11.5–15.5)
WBC Count: 6.2 10*3/uL (ref 4.0–10.5)
nRBC: 0 % (ref 0.0–0.2)

## 2021-02-10 LAB — CMP (CANCER CENTER ONLY)
ALT: 9 U/L (ref 0–44)
AST: 19 U/L (ref 15–41)
Albumin: 2.6 g/dL — ABNORMAL LOW (ref 3.5–5.0)
Alkaline Phosphatase: 249 U/L — ABNORMAL HIGH (ref 38–126)
Anion gap: 10 (ref 5–15)
BUN: 8 mg/dL (ref 6–20)
CO2: 19 mmol/L — ABNORMAL LOW (ref 22–32)
Calcium: 9.7 mg/dL (ref 8.9–10.3)
Chloride: 105 mmol/L (ref 98–111)
Creatinine: 0.87 mg/dL (ref 0.44–1.00)
GFR, Estimated: >60 mL/min (ref >60)
Glucose, Bld: 98 mg/dL (ref 70–99)
Potassium: 4.8 mmol/L (ref 3.5–5.1)
Sodium: 134 mmol/L — ABNORMAL LOW (ref 135–145)
Total Bilirubin: 0.5 mg/dL (ref 0.3–1.2)
Total Protein: 6.7 g/dL (ref 6.5–8.1)

## 2021-02-10 LAB — TSH: TSH: 0.944 u[IU]/mL (ref 0.308–3.960)

## 2021-02-10 NOTE — Telephone Encounter (Signed)
Per Cassie Hellingoepter, PA, placed referral to Hospice. Spoke with Alwyn Ren at [facility] and pt information was given to begin process.

## 2021-02-12 ENCOUNTER — Ambulatory Visit: Payer: Commercial Managed Care - PPO | Admitting: Internal Medicine

## 2021-02-12 ENCOUNTER — Other Ambulatory Visit: Payer: Commercial Managed Care - PPO

## 2021-02-16 ENCOUNTER — Telehealth: Payer: Self-pay | Admitting: Medical Oncology

## 2021-02-16 NOTE — Telephone Encounter (Signed)
Dr . Clifton James will write symptom management orders for pt . He ordered oxygen and Lorazepam.

## 2021-02-17 ENCOUNTER — Other Ambulatory Visit: Payer: Commercial Managed Care - PPO

## 2021-02-24 ENCOUNTER — Other Ambulatory Visit: Payer: Commercial Managed Care - PPO

## 2021-02-24 ENCOUNTER — Ambulatory Visit: Payer: Commercial Managed Care - PPO

## 2021-02-24 ENCOUNTER — Ambulatory Visit: Payer: Commercial Managed Care - PPO | Admitting: Internal Medicine

## 2021-03-05 ENCOUNTER — Ambulatory Visit: Payer: Commercial Managed Care - PPO | Admitting: Urology

## 2021-03-10 NOTE — Progress Notes (Deleted)
Cardiology Clinic Note   Patient Name: Connie Farrell Date of Encounter: 03/10/2021  Primary Care Provider:  Celene Squibb, MD Primary Cardiologist:  Quay Burow, MD  Patient Profile    Connie Farrell 60 year old female presents the clinic today for follow-up evaluation of her hypertension and PAD.  Past Medical History    Past Medical History:  Diagnosis Date   Anxiety    Arthritis    "hands; spine too" (04/04/2012)   Cancer (HCC)    COPD (chronic obstructive pulmonary disease) (HCC)    Exertional dyspnea    Folate deficiency 10/17/2015   GERD (gastroesophageal reflux disease)    Hyperlipemia    Hypertension    Macrocytosis 10/17/2015   Neck pain, chronic    PAD (peripheral artery disease) (HCC)    Renal artery stenosis (HCC)    Renal disorder    Seasonal allergies    Tobacco abuse    Past Surgical History:  Procedure Laterality Date   ABDOMINAL ANGIOGRAM  04/04/2012   Procedure: ABDOMINAL ANGIOGRAM;  Surgeon: Lorretta Harp, MD;  Location: [facility] CATH LAB;  Service: Cardiovascular;;   ANTERIOR CERVICAL DECOMP/DISCECTOMY FUSION  ~ 04/2006   C6, 7, T1   APPENDECTOMY     COLONOSCOPY N/A 02/26/2014   Adequate preparation. Minimal anal canal hemorrhoids;otherwise, normal rectumdiminutive polyps in the middescending segment; otherwise, the remainder of colonic mucosaappeared normal. Hyperplastic polyps.   ESOPHAGOGASTRODUODENOSCOPY N/A 02/26/2014   LEX:NTZGYFVC distal esophagus, query short segment Barrett's. Status post Venia Minks dilation with esophageal biopsy. Abnormal gastric mucosa. Esophageal biopsy negative for Barrett's. Gastric biopsy showed mild chronic gastritis, no H pylori.   INTERCOSTAL NERVE BLOCK Right 06/16/2020   Procedure: INTERCOSTAL NERVE BLOCK;  Surgeon: [name], MD;  Location: [place];  Service: Thoracic;  Laterality: Right;   LAPAROSCOPIC APPENDECTOMY N/A 09/10/2013   Procedure: APPENDECTOMY LAPAROSCOPIC;  Surgeon: Jamesetta So, MD;   Location: [facility] ORS;  Service: General;  Laterality: N/A;   LOWER EXTREMITY ANGIOGRAM N/A 04/04/2012   Procedure: LOWER EXTREMITY ANGIOGRAM;  Surgeon: Lorretta Harp, MD;  Location: [facility] CATH LAB;  Service: Cardiovascular;  Laterality: N/A;   MALONEY DILATION N/A 02/26/2014   Procedure: Venia Minks DILATION;  Surgeon: Daneil Dolin, MD;  Location: [facility];  Service: Endoscopy;  Laterality: N/A;   NODE DISSECTION Right 06/16/2020   Procedure: NODE DISSECTION;  Surgeon: [name], MD;  Location: [place];  Service: Thoracic;  Laterality: Right;   PERIPHERAL ARTERIAL STENT GRAFT  04/04/2012   RENAL ANGIOGRAM Left 04/22/2014   Procedure: RENAL ANGIOGRAM;  Surgeon: Lorretta Harp, MD;  Location: [facility] CATH LAB;  Service: Cardiovascular;  Laterality: Left;   [place]   VASCULAR SURGERY  03/31/2016   Peripheral arterial stent   VIDEO BRONCHOSCOPY WITH ENDOBRONCHIAL NAVIGATION N/A 04/28/2020   Procedure: VIDEO BRONCHOSCOPY WITH ENDOBRONCHIAL NAVIGATION;  Surgeon: [name], MD;  Location: [place];  Service: Thoracic;  Laterality: N/A;   VIDEO BRONCHOSCOPY WITH ENDOBRONCHIAL NAVIGATION N/A 06/16/2020   Procedure: VIDEO BRONCHOSCOPY WITH ENDOBRONCHIAL NAVIGATION;  Surgeon: [name], MD;  Location: [facility] OR;  Service: Thoracic;  Laterality: N/A;    Allergies  Allergies  Allergen Reactions   Latex Rash    Blisters   Codeine Nausea And Vomiting    History of Present Illness   [name] has a PMH of PAD, hypertension, renal artery stenosis, COPD, acute appendicitis, GERD, tobacco abuse, HLD, claudication, dysphagia, right lower quadrant abdominal pain, palpitations, and lung  nodule.   She was last seen by [name] on 03/19/2020.  She was initially referred by [name] for peripheral vascular evaluation.  She was noted to have risk factors of 30-pack-year smoking history, she was reluctant to risk modification.  She indicated she had a strong family history of  heart disease.  She had a negative nuclear stress test by [name] at [place] [facility].  She complained of claudication.  Her Dopplers suggested mild diminished left ABI performed at anytime hospital and confirmed in our lab suggesting high-grade left common iliac artery stenosis.  [name] performed an angiogram 9/13 and confirmed this with a 40 mm pullback gradient.  She received a left common iliac artery stent which improved her left ABI from 0.8-2 0.96.  She also noted improvement in her claudication symptoms.  Her renal Dopplers have been followed closely by [place] to watch for progression of disease.  She had an increase in her serum creatinine from 1.2-2.0.  Her PCP discontinued her ACE inhibitor.  [name] performed angiography on her 10/15 which showed 60% left renal artery stenosis with a 20 mmHg gradient.  Her left iliac artery stent was widely patent.  Medical management was recommended.   During her follow-up visit 03/19/2020 she continued to smoke.  She reported progressive claudication in her left greater than her right.  She was noted to have labile hypertension.  She was scheduled for renal and lower extremity arterial Dopplers in the next month.  Her lower extremity arterial duplex 05/05/2020 showed widely patent left CFA, SFA, popliteal and tibial trunk without focal stenosis.   She was seen and evaluated by [name].  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  Surgery is scheduled for 06/16/2020.   She presented to the clinic 06/11/2020 for follow-up evaluation stated she continued to have left lower extremity claudication.  She stated that she was able to walk fairly well on flat ground however when she walked upstairs or a slight incline she notices claudication.  We reviewed her ABI results and recommendations.  She expressed understanding.  She was still smoking around 1  pack/day.  She denied increased work of breathing and accelerated heart rates.  I will gave her smoking cessation information, had her continue to be as physically active as she could , and planned follow-up for 3 months.   She underwent robotic right upper lobe and middle lobe wedge resection 06/16/2020.  Her postoperative course was uncomplicated.  It was noted that both upper and middle lobes were adenocarcinoma.  She was referred to oncology postoperatively and opted not to have chemotherapy.   She presented to the clinic 09/12/2020 for follow-up evaluation stated she felt like she was doing very well since her surgery.  She met with oncology who told her that her odds of having recurrent cancer would be around 50% even with chemotherapy.  She elected not to do chemotherapy.  She had been increasing her physical activity and reported that her blood pressure had been much better at home and 782N systolic.  She reported that she did not have any pain and she did not have any lower extremity claudication.  She stopped smoking and reported that she did not have any cravings.  I gave her a blood pressure log, had her increase her physical activity as tolerated, continue her low-salt heart healthy diet, and planned follow-up in 6 months.  She  presents to the clinic today for follow-up evaluation states***   Today she denies chest pain, shortness of breath, lower extremity edema, fatigue, palpitations, melena, hematuria, hemoptysis, diaphoresis, weakness, presyncope, syncope, orthopnea, and PND.   Home Medications    Prior to Admission medications   Medication Sig Start Date End Date Taking? Authorizing Provider  albuterol (VENTOLIN HFA) 108 (90 Base) MCG/ACT inhaler Inhale 1-2 puffs into the lungs every 6 (six) hours as needed for wheezing or shortness of breath.    [provider]  amLODipine (NORVASC) 5 MG tablet Take 1 tablet (5 mg total) by mouth daily. 03/19/20 06/17/20  Lorretta Harp,  MD  Budeson-Glycopyrrol-Formoterol (BREZTRI AEROSPHERE) 160-9-4.8 MCG/ACT AERO Inhale 2 puffs into the lungs in the morning and at bedtime. Take 2 puffs first thing in am and then another 2 puffs about 12 hours later. Patient taking differently: Inhale 2 puffs into the lungs in the morning and at bedtime. 02/29/20   Tanda Rockers, MD  clopidogrel (PLAVIX) 75 MG tablet Take 1 tablet (75 mg total) by mouth daily. PLEASE SCHEDULE APPOINTMENT FOR FURTHER REFILLS. 2ND ATTEMPT 06/20/19   Lorretta Harp, MD  cyclobenzaprine (FLEXERIL) 10 MG tablet Take 1 tablet (10 mg total) by mouth 3 (three) times daily as needed for muscle spasms. 08/07/20   Derek Jack, MD  dexlansoprazole (DEXILANT) 60 MG capsule Take 1 capsule (60 mg total) by mouth daily. 11/07/20   Derek Jack, MD  escitalopram (LEXAPRO) 10 MG tablet Take 10 mg by mouth daily.  02/21/12   [provider]  folic acid (FOLVITE) 1 MG tablet Take 1 tablet (1 mg total) by mouth daily. 01/05/21   Curt Bears, MD  metoprolol tartrate (LOPRESSOR) 25 MG tablet Take 1 tablet (25 mg total) by mouth 2 (two) times daily as needed (afib). 04/28/20   [name], MD  oxyCODONE-acetaminophen (PERCOCET/ROXICET) 5-325 MG tablet TAKE 1 TABLET BY MOUTHEVERY 6 HOURS. 02/10/21   Curt Bears, MD  potassium chloride SA (KLOR-CON) 20 MEQ tablet Take 1 tablet (20 mEq total) by mouth 2 (two) times daily. 02/02/21   Heilingoetter, Cassandra L, PA-C  prochlorperazine (COMPAZINE) 10 MG tablet Take 1 tablet (10 mg total) by mouth every 6 (six) hours as needed for nausea or vomiting. 08/26/20   Derek Jack, MD  prochlorperazine (COMPAZINE) 10 MG tablet Take 1 tablet (10 mg total) by mouth every 6 (six) hours as needed for nausea or vomiting. 01/05/21   Curt Bears, MD  rosuvastatin (CRESTOR) 20 MG tablet Take 20 mg by mouth daily.  06/19/18   [provider]  vitamin B-12 (CYANOCOBALAMIN) 1000 MCG tablet Take 1 tablet  (1,000 mcg total) by mouth daily. 04/22/17   Twana First, MD    Family History    Family History  Problem Relation Age of Onset   Leukemia Father    Cancer Father    Heart disease Father    Heart disease Mother    Heart disease Other    Arthritis Other    Asthma Other    Colon cancer Other        unknown, possibly father   She indicated that her mother is alive. She indicated that her father is deceased.  Social History    Social History   Socioeconomic History   Marital status: Married    Spouse name: Not on file   Number of children: Not on file   Years of education: Not on file   Highest education level: Not on  file  Occupational History   Occupation: Chiropodist: [employer]    Comment: [address]  Tobacco Use   Smoking status: Former    Packs/day: 1.00    Years: 44.00    Pack years: 44.00    Types: Cigarettes    Quit date: 06/15/2020    Years since quitting: 0.7   Smokeless tobacco: Never  Vaping Use   Vaping Use: Never used  Substance and Sexual Activity   Alcohol use: No   Drug use: No   Sexual activity: Yes  Other Topics Concern   Not on file  Social History Narrative   Not on file   Social Determinants of Health   Financial Resource Strain: Low Risk    Difficulty of Paying Living Expenses: Not hard at all  Food Insecurity: No Food Insecurity   Worried About Charity fundraiser in the Last Year: Never true   [place] in the Last Year: Never true  Transportation Needs: No Transportation Needs   Lack of Transportation (Medical): No   Lack of Transportation (Non-Medical): No  Physical Activity: Insufficiently Active   Days of Exercise per Week: 3 days   Minutes of Exercise per Session: 10 min  Stress: No Stress Concern Present   Feeling of Stress : Only a little  Social Connections: Socially Isolated   Frequency of Communication with Friends and Family: More than three times a week   Frequency of Social  Gatherings with Friends and Family: More than three times a week   Attends Religious Services: Never   Marine scientist or Organizations: No   Attends Music therapist: Never   Marital Status: Divorced  Human resources officer Violence: Not At Risk   Fear of Current or Ex-Partner: No   Emotionally Abused: No   Physically Abused: No   Sexually Abused: No     Review of Systems    General:  No chills, fever, night sweats or weight changes.  Cardiovascular:  No chest pain, dyspnea on exertion, edema, orthopnea, palpitations, paroxysmal nocturnal dyspnea. Dermatological: No rash, lesions/masses Respiratory: No cough, dyspnea Urologic: No hematuria, dysuria Abdominal:   No nausea, vomiting, diarrhea, bright red blood per rectum, melena, or hematemesis Neurologic:  No visual changes, wkns, changes in mental status. All other systems reviewed and are otherwise negative except as noted above.  Physical Exam    VS:  There were no vitals taken for this visit. , BMI There is no height or weight on file to calculate BMI. GEN: Well nourished, well developed, in no acute distress. HEENT: normal. Neck: Supple, no JVD, carotid bruits, or masses. Cardiac: RRR, no murmurs, rubs, or gallops. No clubbing, cyanosis, edema.  Radials/DP/PT 2+ and equal bilaterally.  Respiratory:  Respirations regular and unlabored, clear to auscultation bilaterally. GI: Soft, nontender, nondistended, BS + x 4. MS: no deformity or atrophy. Skin: warm and dry, no rash. Neuro:  Strength and sensation are intact. Psych: Normal affect.  Accessory Clinical Findings    Recent Labs: 02/10/2021: ALT 9; BUN 8; Creatinine 0.87; Hemoglobin 12.4; Platelet Count 190; Potassium 4.8; Sodium 134; TSH 0.944   Recent Lipid Panel No results found for: CHOL, TRIG, HDL, CHOLHDL, VLDL, LDLCALC, LDLDIRECT  ECG personally reviewed by me today- *** - No acute changes Lower extremity arterial duplex 05/05/2020 Widely patent  left CFA, SFA, popliteal and tibial peroneal trunk, without  focal stenosis.      Summary:  Right: No  infrainguinal focal stenosis.   Left: No infrainguinal focal stenosis.     Assessment & Plan   1.   Peripheral arterial disease-continues to deny lower extremity claudication.  Again reports leg pain resolved after VATS procedure.  Follow-up LAE's showed No infrainguinal focal stenosis bilaterally.    Continue aspirin, Plavix, fenofibrate, rosuvastatin Heart healthy low-sodium high-fiber diet-salty 6 given Increase physical activity as tolerated Continue to monitor/repeat LAE's when clinically indicated   Essential hypertension-BP today ***134/90.  Well-controlled at home. Continue metoprolol, amlodipine Heart healthy low-sodium diet-salty 6 given Increase physical activity as tolerated Maintain blood pressure log   Hyperlipidemia-LDL 59 on 12/26/2018. Continue fenofibrate, rosuvastatin Heart healthy low-sodium high-fiber diet-salty 6 given Increase physical activity as tolerated Repeat fasting lipids and LFTs   Renal artery stenosis-renal duplex 04/23/2019 showed a left RAR 4.6 with pole-to-pole dimension of 8.9 cm   Right upper lobe adenocarcinoma-continues to do well.  Underwent successful VATS procedure with right upper lobe and middle lobe wedge resection.  She was referred to oncology and deferred chemotherapy.  06/16/2020 with [name].   Tobacco abuse-continues with her smoking cessation.   Congratulated on cessation   Disposition: Follow-up with [name] in 9-12 months    Jossie Ng. Abron Neddo NP-C    03/10/2021, 6:56 AM [place] [address] Office [phone] Fax [phone]  Notice: This dictation was prepared with Dragon dictation along with smaller phrase technology. Any transcriptional errors that result from this process are unintentional and may not be corrected upon review.  I spent***minutes examining  this patient, reviewing medications, and using patient centered shared decision making involving her cardiac care.  Prior to her visit I spent greater than 20 minutes reviewing her past medical history,  medications, and prior cardiac tests.

## 2021-03-11 ENCOUNTER — Ambulatory Visit: Payer: Commercial Managed Care - PPO | Admitting: General Practice

## 2021-03-16 ENCOUNTER — Telehealth: Payer: Self-pay | Admitting: *Deleted

## 2021-03-16 ENCOUNTER — Other Ambulatory Visit: Payer: Self-pay | Admitting: Internal Medicine

## 2021-03-16 MED ORDER — OXYCODONE-ACETAMINOPHEN 5-325 MG PO TABS: ORAL_TABLET | ORAL | 0 refills | Status: DC

## 2021-03-16 NOTE — Telephone Encounter (Signed)
Received call from April JOnes RN with [facility] [id number].  She is requesting a refill of patients Oxycodone to be filled to [facility].  Routed to MD to review.

## 2021-03-17 ENCOUNTER — Other Ambulatory Visit: Payer: Commercial Managed Care - PPO

## 2021-03-17 ENCOUNTER — Ambulatory Visit: Payer: Commercial Managed Care - PPO

## 2021-03-17 ENCOUNTER — Ambulatory Visit: Payer: Commercial Managed Care - PPO | Admitting: Internal Medicine

## 2021-03-24 ENCOUNTER — Other Ambulatory Visit: Payer: Self-pay | Admitting: Internal Medicine

## 2021-03-24 ENCOUNTER — Other Ambulatory Visit: Payer: Commercial Managed Care - PPO

## 2021-03-25 ENCOUNTER — Telehealth: Payer: Self-pay | Admitting: Medical Oncology

## 2021-03-25 ENCOUNTER — Other Ambulatory Visit: Payer: Self-pay | Admitting: Medical Oncology

## 2021-03-25 NOTE — Telephone Encounter (Signed)
Percocet -Mitzi from hospice called and said to discontinue the percocet because pt does not want it .   I called pharmacy and they have discontinued the percocet . However , the percocet was delivered by pharmacy to pts house. I notified [facility] ( Mitzi) about this .   Pt is only taking  Oxycodone 10 q 4 hours prn prescribed by Dr Jewel Baize who will now be managing Connie Farrell's pain.

## 2021-03-31 ENCOUNTER — Other Ambulatory Visit: Payer: Commercial Managed Care - PPO

## 2021-04-07 ENCOUNTER — Ambulatory Visit: Payer: Commercial Managed Care - PPO

## 2021-04-07 ENCOUNTER — Ambulatory Visit: Payer: Commercial Managed Care - PPO | Admitting: Physician Assistant

## 2021-04-07 ENCOUNTER — Other Ambulatory Visit: Payer: Commercial Managed Care - PPO

## 2021-04-14 ENCOUNTER — Other Ambulatory Visit: Payer: Commercial Managed Care - PPO

## 2021-04-27 ENCOUNTER — Telehealth: Payer: Self-pay | Admitting: Medical Oncology

## 2021-04-27 NOTE — Telephone Encounter (Signed)
Pt passed away on OCT 7th at 1813.

## 2021-05-19 DEATH — deceased

## 2023-03-19 IMAGING — CT CT CHEST W/ CM
2 of 4 series · 14 of 36 positions shown, 17 images · IV contrast (OMNIPAQUE)
Comparison: Chest CT 08/01/2020

CLINICAL DATA: Non-small cell lung cancer. History of right middle
lobe wedge resection and right upper lobe anterior segmentectomy.

EXAM:
CT CHEST WITH CONTRAST
TECHNIQUE: Multidetector CT imaging of the chest was performed during
intravenous contrast administration.
CONTRAST:  75mL OMNIPAQUE IOHEXOL 300 MG/ML  SOLN

[Series 2: axial st · axial · 0.66mm/px · z∈[-294,-30]mm · 11 of 154 slices shown, 14 images]
[im 11/154  mediastinal]
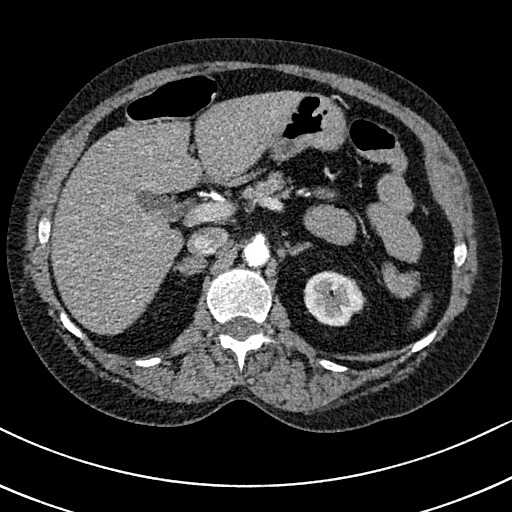
[im 11/154  lung]
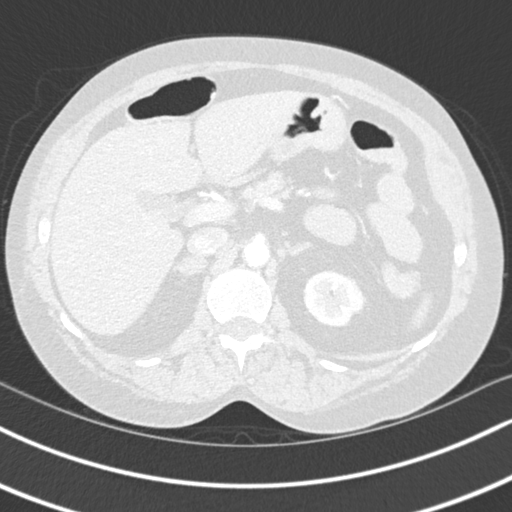
[im 22/154  lung]
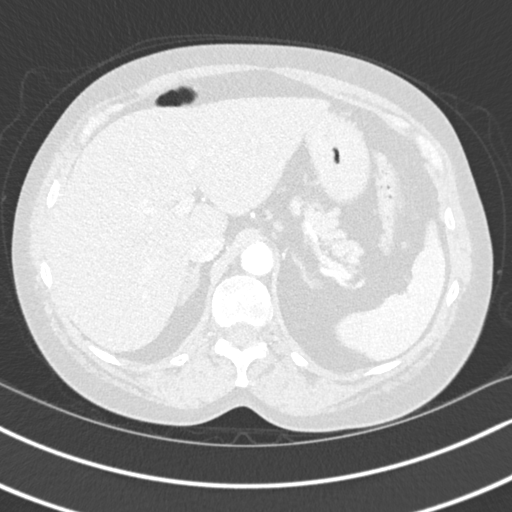
[im 33/154  lung]
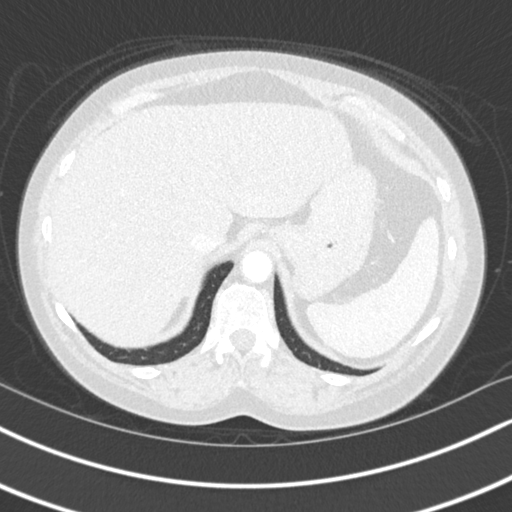
[im 55/154  lung]
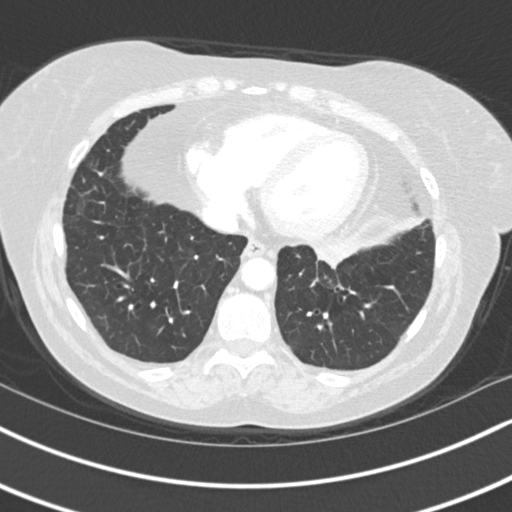
[im 66/154  mediastinal]
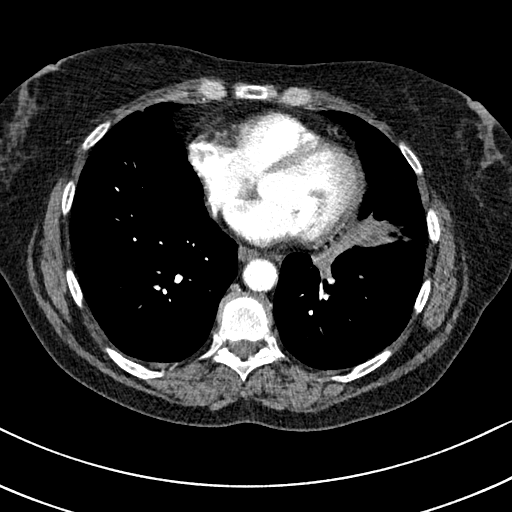
[im 66/154  lung]
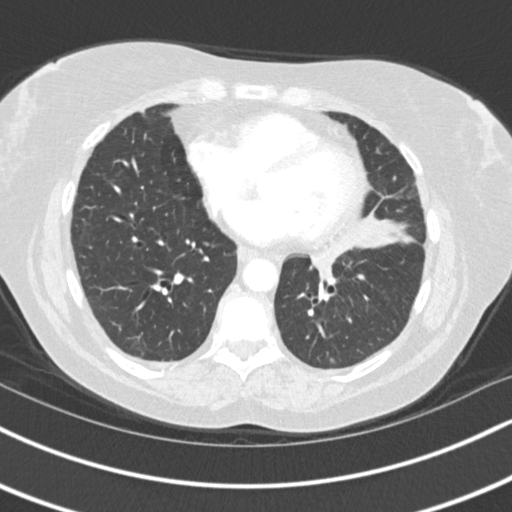
[im 77/154  lung]
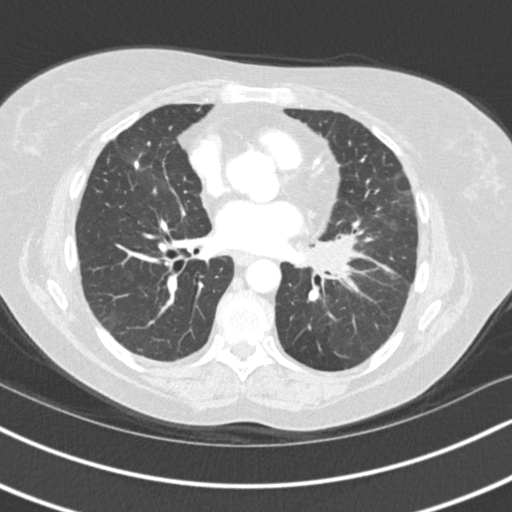
[im 88/154  lung]
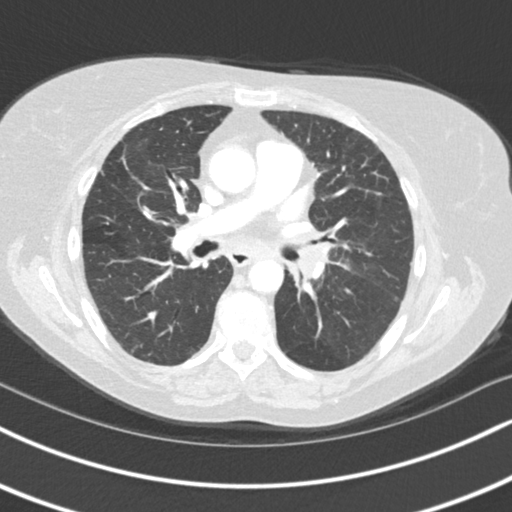
[im 99/154  lung]
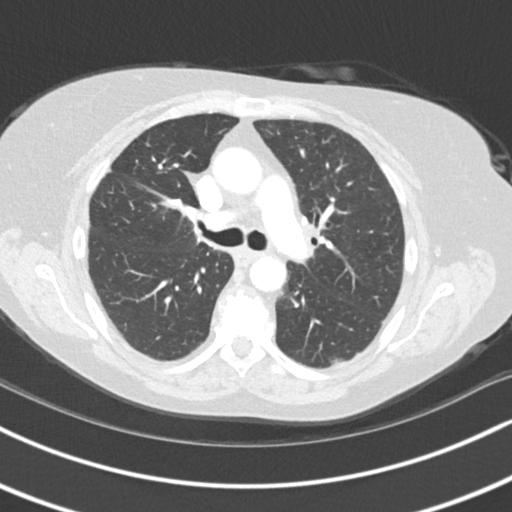
[im 121/154  mediastinal]
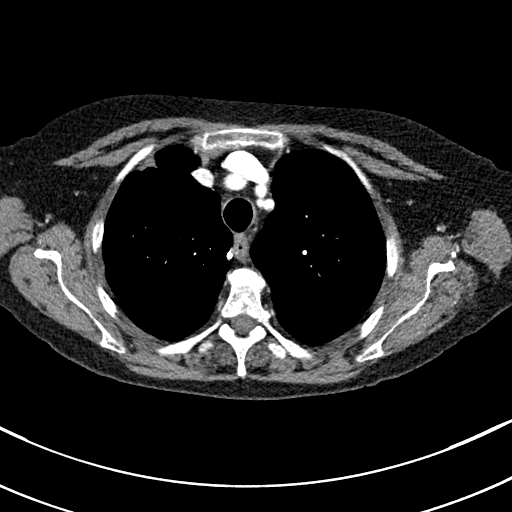
[im 121/154  lung]
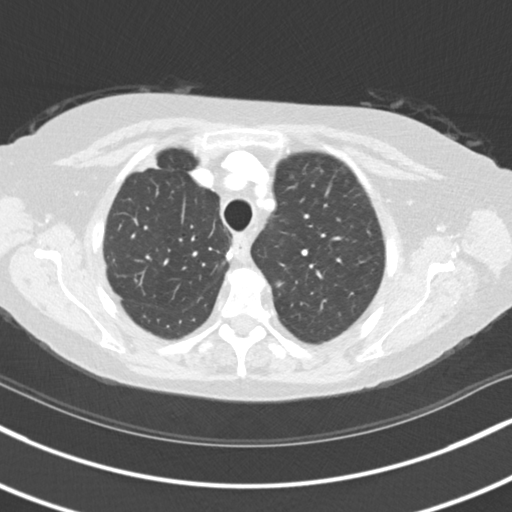
[im 132/154  lung]
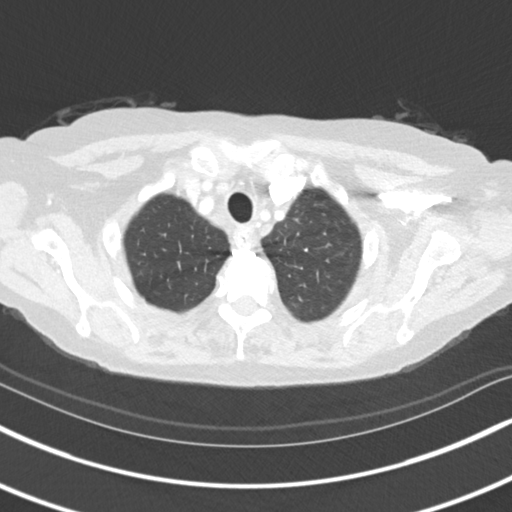
[im 143/154  lung]
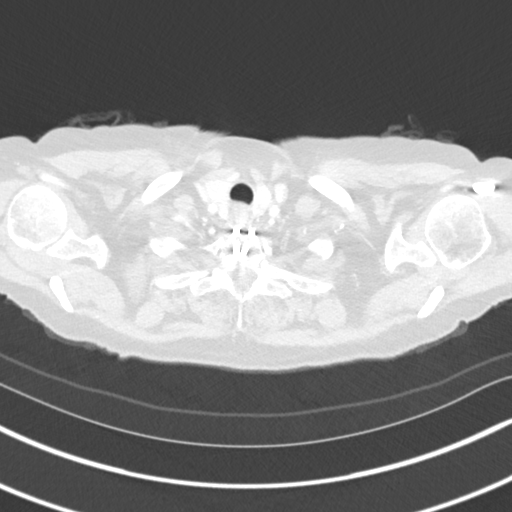

[Series 5: coronal · coronal · 0.62mm/px · 3 of 119 slices shown]
[im 24/119  lung]
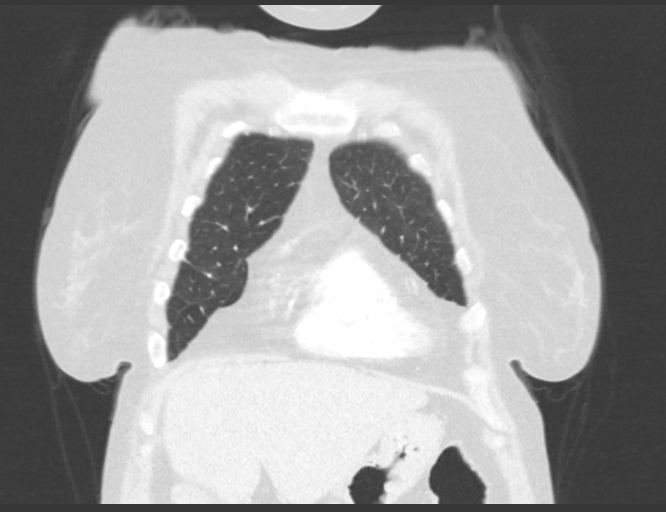
[im 48/119  lung]
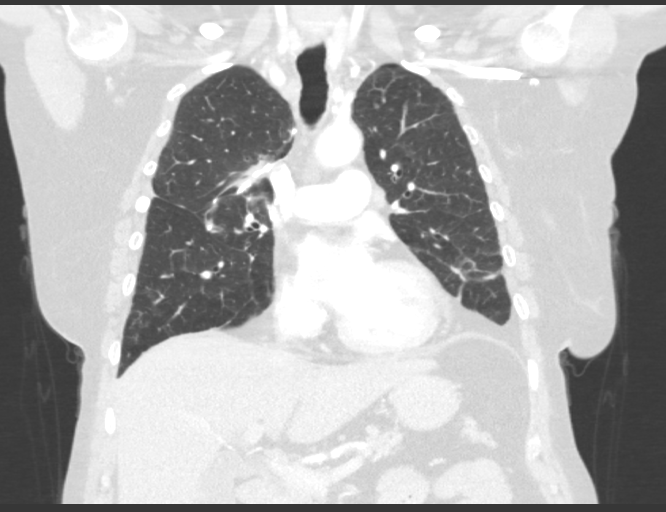
[im 71/119  lung]
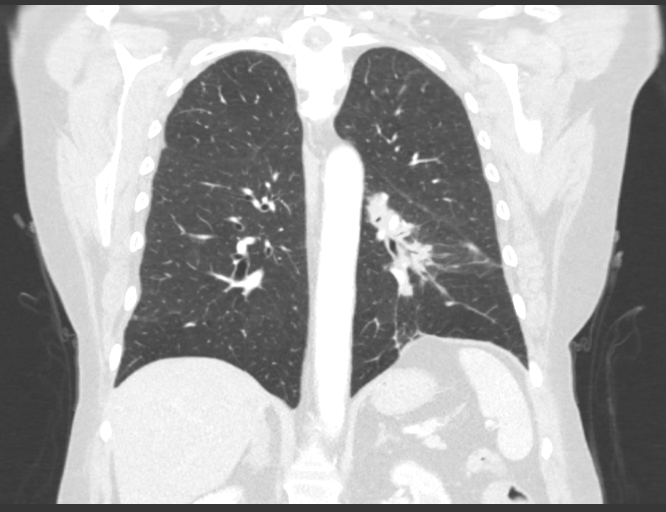

[14 of 36 positions shown; findings below may reference images not displayed]

FINDINGS: Cardiovascular: The heart is normal in size. No pericardial
effusion. The aorta is normal in caliber. No dissection. Vessel the
branch vessels are patent. Stable scattered coronary artery
calcifications.

Mediastinum/Nodes: Progressive mediastinal lymphadenopathy. Right
paratracheal lymph node on image [DATE] measures 10 mm and previously
measured 4.5 mm.

Stable right paratracheal node on image 50/2 measuring 8.5 mm.

Subcarinal node on image 66/2 measures 12 mm and previously measured
8 mm.

Significant progression of left hilar adenopathy. Nodal mass on
image number 70/2 measures 2.3 cm and previously measured 10 mm.
This is associated with an enlarging left lower lobe mass.

Prevascular node on image 60/2 measures 8 mm and previously measured
5 mm.

Lungs/Pleura: Stable surgical changes involving the right upper lobe
and right middle lobe. No recurrent tumor in these areas. Enlarging
left lower lobe lung mass and adjacent left hilar adenopathy.
Findings consistent with progressive neoplasm. Maximum short axis
diameter measures 2.1 cm on image 80/2. This was previously 1.5 cm.

Stable underlying emphysematous changes and pulmonary scarring.
There are few scattered tiny peripheral pulmonary nodules which are
stable.

New 4.5 mm left upper nodule on image 35/7.

No acute overlying pulmonary process.

Upper Abdomen: There is a 15 mm lesion in segment 7 on image 124/2.
Suspect a 10 mm lesion in the right hepatic lobe on image 132/2.

Stable bilateral adrenal gland lesions. No upper abdominal
lymphadenopathy.

Musculoskeletal: No lytic or sclerotic bone lesions are identified.
IMPRESSION: 1. Progressive mediastinal and left hilar lymphadenopathy.
2. Enlarging left lower lobe lung mass and adjacent left hilar
adenopathy.
3. New 4.5 mm left upper lobe pulmonary nodule.
4. Suspect new hepatic metastatic disease.
5. Recommend PET-CT for further evaluation and restaging.
6. Stable surgical changes involving the right upper lobe and right
middle lobe. No recurrent tumor in these areas.
7. Stable emphysematous changes and pulmonary scarring.
8. Stable bilateral adrenal gland lesions.

Aortic Atherosclerosis (S3FD4-P3J.J) and Emphysema (S3FD4-6JA.5).
